# Patient Record
Sex: Female | Born: 1935 | Race: White | Hispanic: No | Marital: Married | Smoking: Never smoker
Health system: Southern US, Community
[De-identification: ages and names within clinical notes are randomized; demographics above are authoritative.]

## PROBLEM LIST (undated history)

## (undated) DIAGNOSIS — I219 Acute myocardial infarction, unspecified: Secondary | ICD-10-CM

## (undated) DIAGNOSIS — I4891 Unspecified atrial fibrillation: Secondary | ICD-10-CM

## (undated) DIAGNOSIS — Z8619 Personal history of other infectious and parasitic diseases: Secondary | ICD-10-CM

## (undated) DIAGNOSIS — K5790 Diverticulosis of intestine, part unspecified, without perforation or abscess without bleeding: Secondary | ICD-10-CM

## (undated) DIAGNOSIS — I2510 Atherosclerotic heart disease of native coronary artery without angina pectoris: Secondary | ICD-10-CM

## (undated) DIAGNOSIS — B0229 Other postherpetic nervous system involvement: Secondary | ICD-10-CM

## (undated) DIAGNOSIS — I259 Chronic ischemic heart disease, unspecified: Secondary | ICD-10-CM

## (undated) DIAGNOSIS — M1990 Unspecified osteoarthritis, unspecified site: Secondary | ICD-10-CM

## (undated) DIAGNOSIS — E785 Hyperlipidemia, unspecified: Secondary | ICD-10-CM

## (undated) HISTORY — DX: Atherosclerotic heart disease of native coronary artery without angina pectoris: I25.10

## (undated) HISTORY — DX: Unspecified osteoarthritis, unspecified site: M19.90

## (undated) HISTORY — DX: Hyperlipidemia, unspecified: E78.5

## (undated) HISTORY — DX: Unspecified atrial fibrillation: I48.91

## (undated) HISTORY — DX: Diverticulosis of intestine, part unspecified, without perforation or abscess without bleeding: K57.90

## (undated) HISTORY — DX: Personal history of other infectious and parasitic diseases: Z86.19

## (undated) HISTORY — DX: Other postherpetic nervous system involvement: B02.29

## (undated) HISTORY — DX: Chronic ischemic heart disease, unspecified: I25.9

---

## 1987-01-25 HISTORY — PX: EYE SURGERY: SHX253

## 1990-01-24 HISTORY — PX: BREAST LUMPECTOMY: SHX2

## 1997-08-12 ENCOUNTER — Other Ambulatory Visit: Admission: RE | Admit: 1997-08-12 | Discharge: 1997-08-12 | Payer: Self-pay | Admitting: Obstetrics and Gynecology

## 1998-07-02 ENCOUNTER — Encounter: Payer: Self-pay | Admitting: *Deleted

## 1998-07-02 ENCOUNTER — Ambulatory Visit (HOSPITAL_COMMUNITY): Admission: RE | Admit: 1998-07-02 | Discharge: 1998-07-02 | Payer: Self-pay | Admitting: *Deleted

## 2000-01-25 DIAGNOSIS — I2510 Atherosclerotic heart disease of native coronary artery without angina pectoris: Secondary | ICD-10-CM

## 2000-01-25 HISTORY — PX: CORONARY ARTERY BYPASS GRAFT: SHX141

## 2000-01-25 HISTORY — DX: Atherosclerotic heart disease of native coronary artery without angina pectoris: I25.10

## 2000-06-07 ENCOUNTER — Encounter: Payer: Self-pay | Admitting: Emergency Medicine

## 2000-06-07 ENCOUNTER — Inpatient Hospital Stay (HOSPITAL_COMMUNITY): Admission: EM | Admit: 2000-06-07 | Discharge: 2000-06-19 | Payer: Self-pay | Admitting: Emergency Medicine

## 2000-06-08 HISTORY — PX: CARDIAC CATHETERIZATION: SHX172

## 2000-06-12 ENCOUNTER — Encounter: Payer: Self-pay | Admitting: Surgery

## 2000-06-13 ENCOUNTER — Encounter: Payer: Self-pay | Admitting: Surgery

## 2000-06-14 ENCOUNTER — Encounter: Payer: Self-pay | Admitting: Surgery

## 2000-07-18 ENCOUNTER — Encounter (HOSPITAL_COMMUNITY): Admission: RE | Admit: 2000-07-18 | Discharge: 2000-10-16 | Payer: Self-pay | Admitting: Cardiology

## 2002-01-29 ENCOUNTER — Other Ambulatory Visit: Admission: RE | Admit: 2002-01-29 | Discharge: 2002-01-29 | Payer: Self-pay | Admitting: Family Medicine

## 2005-04-20 ENCOUNTER — Other Ambulatory Visit: Admission: RE | Admit: 2005-04-20 | Discharge: 2005-04-20 | Payer: Self-pay | Admitting: Family Medicine

## 2009-08-26 ENCOUNTER — Ambulatory Visit: Payer: Self-pay | Admitting: Cardiology

## 2009-09-02 ENCOUNTER — Ambulatory Visit: Payer: Self-pay | Admitting: Cardiology

## 2009-09-11 ENCOUNTER — Ambulatory Visit: Payer: Self-pay | Admitting: Cardiology

## 2009-09-18 ENCOUNTER — Ambulatory Visit: Payer: Self-pay | Admitting: Cardiology

## 2009-10-02 ENCOUNTER — Ambulatory Visit: Payer: Self-pay | Admitting: Cardiology

## 2009-10-02 ENCOUNTER — Encounter: Admission: RE | Admit: 2009-10-02 | Discharge: 2009-10-02 | Payer: Self-pay | Admitting: Cardiology

## 2009-10-20 ENCOUNTER — Ambulatory Visit: Payer: Self-pay | Admitting: Cardiology

## 2009-10-22 ENCOUNTER — Ambulatory Visit: Payer: Self-pay | Admitting: Cardiology

## 2009-10-23 ENCOUNTER — Ambulatory Visit (HOSPITAL_COMMUNITY): Admission: RE | Admit: 2009-10-23 | Discharge: 2009-10-23 | Payer: Self-pay | Admitting: Cardiology

## 2009-10-23 ENCOUNTER — Ambulatory Visit: Payer: Self-pay | Admitting: Cardiology

## 2009-10-29 ENCOUNTER — Ambulatory Visit: Payer: Self-pay | Admitting: Cardiology

## 2009-11-26 ENCOUNTER — Ambulatory Visit: Payer: Self-pay | Admitting: Cardiology

## 2009-12-07 ENCOUNTER — Ambulatory Visit: Payer: Self-pay | Admitting: Cardiology

## 2010-01-05 ENCOUNTER — Ambulatory Visit: Payer: Self-pay | Admitting: Cardiology

## 2010-02-05 ENCOUNTER — Ambulatory Visit: Payer: Self-pay | Admitting: Cardiology

## 2010-03-08 ENCOUNTER — Encounter (INDEPENDENT_AMBULATORY_CARE_PROVIDER_SITE_OTHER): Payer: Medicare Other

## 2010-03-08 DIAGNOSIS — R011 Cardiac murmur, unspecified: Secondary | ICD-10-CM

## 2010-03-08 DIAGNOSIS — I4891 Unspecified atrial fibrillation: Secondary | ICD-10-CM

## 2010-03-18 ENCOUNTER — Other Ambulatory Visit (INDEPENDENT_AMBULATORY_CARE_PROVIDER_SITE_OTHER): Payer: Medicare Other

## 2010-03-18 DIAGNOSIS — Z7901 Long term (current) use of anticoagulants: Secondary | ICD-10-CM

## 2010-03-18 DIAGNOSIS — I4891 Unspecified atrial fibrillation: Secondary | ICD-10-CM

## 2010-04-05 ENCOUNTER — Encounter (INDEPENDENT_AMBULATORY_CARE_PROVIDER_SITE_OTHER): Payer: Medicare Other

## 2010-04-05 DIAGNOSIS — Z7901 Long term (current) use of anticoagulants: Secondary | ICD-10-CM

## 2010-04-05 DIAGNOSIS — I4891 Unspecified atrial fibrillation: Secondary | ICD-10-CM

## 2010-04-19 ENCOUNTER — Ambulatory Visit (INDEPENDENT_AMBULATORY_CARE_PROVIDER_SITE_OTHER): Payer: Medicare Other | Admitting: *Deleted

## 2010-04-19 DIAGNOSIS — I4819 Other persistent atrial fibrillation: Secondary | ICD-10-CM | POA: Insufficient documentation

## 2010-04-19 DIAGNOSIS — E7800 Pure hypercholesterolemia, unspecified: Secondary | ICD-10-CM

## 2010-04-19 DIAGNOSIS — Z7901 Long term (current) use of anticoagulants: Secondary | ICD-10-CM

## 2010-04-19 DIAGNOSIS — I4811 Longstanding persistent atrial fibrillation: Secondary | ICD-10-CM | POA: Insufficient documentation

## 2010-04-19 DIAGNOSIS — I4891 Unspecified atrial fibrillation: Secondary | ICD-10-CM

## 2010-04-19 LAB — CBC WITH DIFFERENTIAL/PLATELET
Basophils Absolute: 0 10*3/uL (ref 0.0–0.1)
Basophils Relative: 0 % (ref 0–1)
Eosinophils Absolute: 0.2 10*3/uL (ref 0.0–0.7)
Eosinophils Relative: 3 % (ref 0–5)
HCT: 43 % (ref 36.0–46.0)
Hemoglobin: 14 g/dL (ref 12.0–15.0)
Lymphocytes Relative: 29 % (ref 12–46)
Lymphs Abs: 1.4 10*3/uL (ref 0.7–4.0)
MCH: 30.1 pg (ref 26.0–34.0)
MCHC: 32.6 g/dL (ref 30.0–36.0)
MCV: 92.5 fL (ref 78.0–100.0)
Monocytes Absolute: 0.5 10*3/uL (ref 0.1–1.0)
Monocytes Relative: 10 % (ref 3–12)
Neutro Abs: 2.8 10*3/uL (ref 1.7–7.7)
Neutrophils Relative %: 57 % (ref 43–77)
Platelets: 259 10*3/uL (ref 150–400)
RBC: 4.65 MIL/uL (ref 3.87–5.11)
RDW: 14 % (ref 11.5–15.5)
WBC: 4.9 10*3/uL (ref 4.0–10.5)

## 2010-04-19 LAB — POCT INR: INR: 2.3

## 2010-04-19 NOTE — Progress Notes (Signed)
Addended by: Carman Ching on: 04/19/2010 12:58 PM   Modules accepted: Orders

## 2010-04-20 LAB — COMPREHENSIVE METABOLIC PANEL
ALT: 28 U/L (ref 0–35)
AST: 20 U/L (ref 0–37)
Albumin: 4.4 g/dL (ref 3.5–5.2)
Alkaline Phosphatase: 66 U/L (ref 39–117)
BUN: 23 mg/dL (ref 6–23)
CO2: 25 mEq/L (ref 19–32)
Calcium: 9.1 mg/dL (ref 8.4–10.5)
Chloride: 106 mEq/L (ref 96–112)
Creat: 0.77 mg/dL (ref 0.40–1.20)
Glucose, Bld: 91 mg/dL (ref 70–99)
Potassium: 4.2 mEq/L (ref 3.5–5.3)
Sodium: 143 mEq/L (ref 135–145)
Total Bilirubin: 0.4 mg/dL (ref 0.3–1.2)
Total Protein: 6.9 g/dL (ref 6.0–8.3)

## 2010-04-20 LAB — LIPID PANEL
HDL: 47 mg/dL (ref >39)
Total CHOL/HDL Ratio: 4.4 Ratio

## 2010-04-23 ENCOUNTER — Telehealth: Payer: Self-pay | Admitting: *Deleted

## 2010-04-23 NOTE — Telephone Encounter (Signed)
Message copied by Regis Bill on Fri Apr 23, 2010  9:53 AM ------      Message from: Cassell Clement      Created: Tue Apr 20, 2010  9:37 AM       Please report.  LDL cholesterol slightly high at 134.  Total cholesterol 208.  Continue same regimen and careful diet.

## 2010-04-23 NOTE — Progress Notes (Signed)
Advised patient of labs 

## 2010-05-17 ENCOUNTER — Encounter: Payer: Self-pay | Admitting: Cardiology

## 2010-05-17 DIAGNOSIS — I259 Chronic ischemic heart disease, unspecified: Secondary | ICD-10-CM | POA: Insufficient documentation

## 2010-05-17 DIAGNOSIS — E785 Hyperlipidemia, unspecified: Secondary | ICD-10-CM | POA: Insufficient documentation

## 2010-05-17 DIAGNOSIS — I2510 Atherosclerotic heart disease of native coronary artery without angina pectoris: Secondary | ICD-10-CM | POA: Insufficient documentation

## 2010-05-24 ENCOUNTER — Encounter (INDEPENDENT_AMBULATORY_CARE_PROVIDER_SITE_OTHER): Payer: Medicare Other | Admitting: *Deleted

## 2010-05-24 ENCOUNTER — Encounter: Payer: Self-pay | Admitting: Cardiology

## 2010-05-24 ENCOUNTER — Ambulatory Visit (INDEPENDENT_AMBULATORY_CARE_PROVIDER_SITE_OTHER): Payer: Medicare Other | Admitting: *Deleted

## 2010-05-24 ENCOUNTER — Ambulatory Visit (INDEPENDENT_AMBULATORY_CARE_PROVIDER_SITE_OTHER): Payer: Medicare Other | Admitting: Cardiology

## 2010-05-24 DIAGNOSIS — I4891 Unspecified atrial fibrillation: Secondary | ICD-10-CM

## 2010-05-24 DIAGNOSIS — E785 Hyperlipidemia, unspecified: Secondary | ICD-10-CM

## 2010-05-24 LAB — POCT INR: INR: 2.6

## 2010-05-24 NOTE — Progress Notes (Signed)
Ellen Barajas Date of Birth:  Jul 24, 1928 [facility]  [address]   [address] [place], [place]  [address] [phone]           Fax   [phone]  HPI: This pleasant 77 year old woman has a history of established atrial fibrillation.  She also has a history of ischemic heart disease.  She underwent coronary artery bypass graft surgery on 06/12/00 by Dr. Laneta Simmers.  She has not had any recurrent angina.  She developed atrial fibrillation in July 2011.  She underwent successful cardioversion on 10/23/09.  However, one week later she was back in atrial fibrillation.  At that point we elected not to make any further attempts at cardioversion since she herself is not aware of her arrhythmia and is really not very symptomatic from the atrial fibrillation.  Her last echocardiogram on 08/20/09 showed normal left atrial size and normal left ventricular ejection fraction of 55-60% and mild aortic sclerosis with trivial aortic insufficiency and mild mitral regurgitation and normal pulmonary artery pressure.  Current Outpatient Prescriptions  Medication Sig Dispense Refill  . aspirin 81 MG tablet Take 81 mg by mouth daily.        Marland Kitchen atenolol (TENORMIN) 25 MG tablet Take 25 mg by mouth daily. 1/4 DAILY        . COD LIVER OIL PO Take by mouth at bedtime.        . Coenzyme Q10 (COQ10 PO) Take by mouth daily.        . fish oil-omega-3 fatty acids 1000 MG capsule Take by mouth 2 (two) times daily.        . nitroGLYCERIN (NITROSTAT) 0.4 MG SL tablet Place 0.4 mg under the tongue every 5 (five) minutes as needed.        . pravastatin (PRAVACHOL) 10 MG tablet Take 10 mg by mouth every other day.        . ramipril (ALTACE) 10 MG tablet Take 10 mg by mouth daily.        . Red Yeast Rice Extract (RED YEAST RICE PO) Take by mouth 2 (two) times daily.        Marland Kitchen warfarin (COUMADIN) 5 MG tablet Take 5 mg by mouth daily. AS DIRECTED         Allergies  Allergen Reactions  . Crestor  (Rosuvastatin Calcium)   . Lipitor (Atorvastatin Calcium)   . Niaspan (Niacin (Antihyperlipidemic))   . Welchol (Colesevelam Hcl)   . Zetia (Ezetimibe)     Patient Active Problem List  Diagnoses  . Atrial fibrillation  . AF (atrial fibrillation)  . IHD (ischemic heart disease)  . Dyslipidemia  . Coronary artery disease    History  Smoking status  . Never Smoker   Smokeless tobacco  . Not on file    History  Alcohol Use No    No family history on file.  Review of Systems: The patient denies any heat or cold intolerance.  No weight gain or weight loss.  The patient denies headaches or blurry vision.  There is no cough or sputum production.  The patient denies dizziness.  There is no hematuria or hematochezia.  The patient denies any muscle aches or arthritis.  The patient denies any rash.  The patient denies frequent falling or instability.  There is no history of depression or anxiety.  All other systems were reviewed and are negative.   Physical Exam: Filed Vitals:   05/24/10 1125  BP: 140/70  Pulse: 70  Weight is 160.  The general appearance reveals a well-developed well-nourished woman in no distress.Pupils equal and reactive.   Extraocular Movements are full.  There is no scleral icterus.  The mouth and pharynx are normal.  The neck is supple.  The carotids reveal no bruits.  The jugular venous pressure is normal.  The thyroid is not enlarged.  There is no lymphadenopathy.The chest is clear to percussion and auscultation. There are no rales or rhonchi. Expansion of the chest is symmetrical.  The breasts reveal no masses.The precordium is quiet.  The first heart sound is normal.  The second heart sound is physiologically split.  There is no murmur gallop rub or click.  There is no abnormal lift or heave.  The rhythm is irregular.The abdomen is soft and nontender. Bowel sounds are normal. The liver and spleen are not enlarged. There Are no abdominal masses. There are no  bruits.The pedal pulses are good.  There is no phlebitis or edema.  There is no cyanosis or clubbing.Strength is normal and symmetrical in all extremities.  There is no lateralizing weakness.  There are no sensory deficits.   Assessment / Plan: Her INR is 2.6.  She will continue same Coumadin.  Recheck for monthly protimes and she will return in 3 months for followup office visit protime and fasting lipid panel and chemistries

## 2010-05-24 NOTE — Assessment & Plan Note (Signed)
The patient is not presently on any statin therapy.  She has been intolerant to Lipitor Zetia Crestor Niaspan and WelChol.  She is trying to manage her cholesterol levels with careful diet and with exercise.  She has not been successful in weight loss at this point.  She has not been using any chest pain or angina.

## 2010-05-24 NOTE — Assessment & Plan Note (Signed)
The patient has a history of established chronic atrial fibrillation.  She is on long-term Coumadin .  She denies any symptoms of TIA or stroke.  [name] not having any increased dyspnea or any symptoms or signs of congestive heart failure.  [name] not having complications from the Coumadin.

## 2010-05-27 ENCOUNTER — Telehealth: Payer: Self-pay | Admitting: Cardiology

## 2010-05-27 NOTE — Telephone Encounter (Signed)
Received message to call patient regarding copays.  Patient states they did not pay copay on last visit.  I looked at the check out screen and see that there was a copay posted for both her and husband.  Advised patient that I am unable to determine how much if anything she owes.  She can not specify dos that she thinks they owe, said it was last visit which is what I looked at.  Advised patient that we would wait and see if they receive a bill for the dates she thinks they owe.  Going forward we will make sure the copay is collected at time of service and if there is a question to ask for me.  Patient ok.

## 2010-06-10 ENCOUNTER — Telehealth: Payer: Self-pay | Admitting: *Deleted

## 2010-06-10 NOTE — Telephone Encounter (Signed)
Patient phoned and is having cataract surgery on 5/22 and is wanting to know what to do with coumadin.  Advised ok to hold the night before per Dr. Patty Sermons.

## 2010-06-11 NOTE — Discharge Summary (Signed)
[place]  Patient:    Ellen Barajas, Ellen Barajas                         MRN: [id number] Adm. Date:  [id number] Disc. Date: [id number] Attending:  Cleatrice Burke Dictator:   Dominica Severin, P.A. CC:         Willis Modena. Dreiling, M.D.  Thomas A. Patty Sermons, M.D.  CVTS Office   Discharge Summary  DATE OF BIRTH:  12-Mar-1928  PRIMARY ADMISSION DIAGNOSIS:  Chest pain, rule out myocardial infarction.  SECONDARY DIAGNOSES/PREEXISTING CONDITION: 1. Hyperlipidemia. 2. History of diverticulosis. 3. History of gallstones. 4. 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 5. Status post eye surgery in 1989 by Dr. Cecilie Kicks. 6. Allergies to all NARCOTICS.  NEW DIAGNOSES THIS ADMISSION/DISCHARGE DIAGNOSES: 1. Preoperative as well as postoperative atrial fibrillation. 2. Severe two-vessel coronary artery disease, status post non-Q-wave    myocardial infarction.  PROCEDURES THIS ADMISSION: 1. Cardiac catheterization done on May 16. 2. Pre coronary artery bypass graft surgery Doppler evaluation on    May 17. 3. On May 20 the patient underwent coronary artery bypass graft surgery x4,    the left internal mammary artery to the left anterior descending artery,    saphenous vein graft to the obtuse marginal #1 branch and obtuse marginal    #2 branch and saphenous vein graft to the right coronary artery.  HOSPITAL COURSE:  This patient was admitted on May 15 to [facility] ER in the care of Dr. Patty Sermons to rule out an MI.  Cardiac catheterization was done on May 16 which showed severe two-vessel coronary artery disease and the patient was seen at that time by Dr. Laneta Simmers for consultation, and it was determine at the time that the patient would benefit from coronary artery bypass graft surgery. The patient underwent surgery as stated above on Jun 12, 2000.  She tolerated the procedure well.  Her postoperative course was notable for some atrial fibrillation  with rapid ventricular response.  The patient was started on digoxin, Lopressor and Coumadin initiated.  The patient continued to be in and out of atrial fibrillation, and on postop day #4 the patient was started on amiodarone and eventually converted to normal sinus rhythm and remained in normal sinus rhythm.  Her INR was therapeutic and the patient is discharged on Jun 19, 2000, in stable condition.  DISCHARGE MEDICATIONS: 1. Atenolol 12.5 mg p.o. b.i.d. 2. Amiodarone 400 mg p.o. t.i.d. x3 days and then 200 mg once a day. 3. Digoxin 0.125 mg once a day. 4. Niferex 150 mg once a day. 5. Colace 200 mg once a day. 6. Coumadin 2.5 mg once a day or as directed by blood draws.  ACTIVITY:  The patient is instructed not to do any driving or heavy lifting, pushing or pulling.  To continue her breathing exercises and continue to walk daily.  DIET:  The patient is to follow a low fat, low sodium diet.  WOUND CARE:  She was told she could shower and call the office for any wound problems arises noted on cardiac surgery fact sheet.  SPECIAL INSTRUCTIONS:  The patient is to have her blood drawn on Wednesday, May 29 at Dr. Jenness Corner office and at that time they will determine her Coumadin dosages.  FOLLOWUP:  The patient has a followup date in 2 weeks with Dr. Patty Sermons with a chest x-ray and she is to bring  that chest x-ray with her to Dr. Laneta Simmers office in 3 weeks, and the office will call her with that appointment time. DD:  06/19/00 TD:  06/19/00 Job: [id number]

## 2010-06-11 NOTE — H&P (Signed)
[place]  Patient:    Ellen Barajas, Ellen Barajas                         MRN: [id number] Adm. Date:  [id number] Attending:  Molpus, Carlisle Beers CC:         Amada Jupiter T. Dreiling, M.D.   History and Physical  CHIEF COMPLAINT:  Chest pain.  HISTORY:  This is a 77 year old, married, Caucasian female admitted with chest pain through the emergency room.  She had the onset of chest discomfort approximately 12 hours ago.  It began at bedtime last evening.  She thought it was indigestion.  It persisted throughout the night.  At about 5 a.m., because she had not slept, she got up and sat in a chair, but the pain did not improve.  The pain was substernal and radiated under the left breast.  There was no associated dyspnea.  This morning, she was nauseated but did not vomit. She was not diaphoretic.  The pain was not pleuritic and did not appear to the affected by change in position.  The patient has been healthy throughout her life.  Her only problem has been elevated cholesterol which is a family history trait.  Her cholesterol had previously been 252; and, with careful dieting, she had been able to get it down to 199.  She had not been on any lipid-lowering medicines.  MEDICATIONS:  She is no on any medications except for 81 mg aspirin a day. She also takes calcium, multivitamins, vitamin A, vitamin E, vitamin C, several herbs, and she takes Bioflex for tendonitis.  PAST MEDICAL HISTORY:  She has no history of diabetes, hypertension, or stroke.  SOCIAL HISTORY:  She has been married for 51 years.  She has two children, one of whom lives in [place].  The patient does not use tobacco or alcohol. She is active in church work.  ALLERGIES:  She is allergic to all NARCOTICS.  PAST SURGICAL HISTORY:  She had a fibrous tumor removed from her left breast by Dr. Verne Carrow about 10 years ago.  She has had childbirth twice.  She had a hole in her retina repaired by Dr.  Cecilie Kicks in 1989.  REVIEW OF SYSTEMS:  She has a history of diverticulosis.  She also has a known gallstone which has not been operated on.  She has a remote history of stomach spasm.  She denies any dysuria but does have a leaking bladder and has nocturia x 1.  Respiratory reveals a history of recent mild upper respiratory infection with cough and phlegm but no fever and no purulent sputum. Cardiovascular history reveals that she is able to mow her yard.  She does not have any history of exertional chest pain.  A week ago, while working in the garden, she got unexpectedly exhausted and had to lie down on the ground for a short time to get her strength back but then was able to proceed with finishing the work.  She has noted occasional runs of palpitations over the years.  These have been brief and usually are corrected by burping several times.  FAMILY HISTORY:  Her mother died at age 30 after having heart attack and stroke.  Her father died of a heart attack at 72.  She has one brother who has ischemic heart disease and diabetes and has an AICD.  She has a sister and a brother who are asymptomatic except for elevated cholesterol.  PHYSICAL EXAMINATION:  VITAL SIGNS:  Blood pressure 140/70, pulse 130 in atrial fibrillation which falls to 60 sinus rhythm without specific therapy.  HEAD AND NECK:  Negative.  Jugular venous pressure normal.  Carotids normal.  CHEST:  Clear.  The heart reveals a quiet precordium without murmur, gallop, rub, or click.  ABDOMEN:  Soft without evidence of organomegaly or masses.  There is no chest wall tenderness.  EXTREMITIES:  Good peripheral pulses.  No edema or phlebitis.  LABORATORY DATA:  Her electrocardiogram #1 shows normal sinus rhythm and no ischemic changes; #2 shows atrial fibrillation with rapid ventricular response and no ischemic change.  Chest x-ray is normal and shows normal heart size and clear lung fields.  Laboratory studies of  note include a CK of 92 with an MB of 3.5 and a troponin slightly elevated at 0.14.  IMPRESSION: 1. Chest pain, rule out myocardial infarction. 2. Paroxysmal atrial fibrillation. 3. History of hypercholesterolemia.  DISPOSITION:  Admit to telemetry.  Will treat with IV nitroglycerin, IV heparin.  Will also give oral beta blocker and aspirin.  Will get serial enzymes.  Will also give her an empiric trial of Nexium.  Anticipate cardiac catheterization Jun 08, 2000. DD:  06/07/00 TD:  06/07/00 Job: [id number]

## 2010-06-11 NOTE — Op Note (Signed)
[place]  Patient:    EVAH, RASHID                         MRN: [id number] Proc. Date: 06/12/00 Adm. Date:  [id number] Attending:  Cleatrice Burke CC:         CVTS office.  Thomas A. Patty Sermons, M.D.  Cardiac Catheterization Lab at [facility]   Operative Report  PREOPERATIVE DIAGNOSIS:  Severe 2 vessel coronary artery disease with unstable angina status post non Q wave myocardial infarction.  POSTOPERATIVE DIAGNOSIS:  Severe 2 vessel coronary artery disease with unstable angina status post nn Q wave myocardial infarction.  PROCEDURE:  Median sternotomy, extracorporeal circulation, coronary artery bypass graft x 4 using a left internal mammary artery graft to the left anterior descending coronary, with a sequential saphenous vein graft to the first and second obtuse marginal branch of the left circumflex coronary artery and a saphenous vein graft to the right coronary artery.  SURGEON:  Alleen Borne, M.D.  ASSISTANT:  Dominica Severin, P.A.-C.  ANESTHESIA:  General endotracheal.  INDICATIONS:  This patient is a 77 year old white female in previous good health who was admitted on Jun 07, 2000, with a prolonged episode of chest pain.  She ruled in for a non Q wave myocardial infarction with mildly elevated troponin level of 0.14.  Her CPK was 92 with an MB of 3.5.  She had no significant electrocardiogram changes on admission but did develop atrial fibrillation with a rapid ventricular response.  She underwent cardiac catheterization on Jun 08, 2000, which showed the LAD to have heavy proximal calcification.  There was 50 to 70% proximal LAD stenosis.  The left circumflex was diffusely diseased with 40-50% proximal and mid vessel stenosis.  There was a 90% stenosis of the bifurcation of the first marginal branch.  The second marginal had a 80% stenosis of the bifurcation.  The right coronary artery had 30% proximal and mid vessel  stenosis.  Left ventricular function  was normal.  After review of the angiogram and examination of the patient it was felt that coronary artery bypass graft surgery was the best treatment.  I discussed the operative procedure with her and her husband including alternatives, benefits, and risks, including bleeding, possible blood transfusion, infection, stroke, myocardial infarction and death.  They understood and agreed to proceed.  DESCRIPTION OF PROCEDURE:  The patient was taken to the operating room and placed on the table in the supine position.  After induction of general endotracheal anesthesia, a Foley catheter was placed in the bladder using a sterile technique.  Then the chest, abdomen and both lower extremities were prepped and draped in the usual sterile manner.  The chest was entered through a median sternotomy incision and the pericardium opened in the midline. Examination of the heart showed good ventricular contractility.  The ascending aorta had no palpable plaques in it.  Then the left internal mammary artery was harvested from the chest wall as a pedicle graft.  This was a median caliber vessel with excellent blood flow through it.  At the same time a segment of greater saphenous vein was harvested from the right lower leg, and this vein was of medium size and good quality.  Then the patient was heparinized and when an adequate activated clotting time was achieved, the distal ascending aorta was cannulated using a 20 French aortic cannula for arterial inflow.  Venous outflow was achieved using the  two stage venous cannula through the atrial appendage.  Antegrade cardioplegia and vent cannula were inserted in the aortic root.  The patient was placed on coronary artery bypass and distal coronary arteries identified.  The LAD was an intramyocardial vessel in its proximal and mid portions.  It exited distally near the apex to the surface of the heart where it was a very  tiny vessel.  There was a large anterior descending vein coarsing over the interventricular groove.  The first and second marginal branches were medium sized graftable vessels. The right coronary artery was heavily diseased in its proximal and mid portions.  Distally just before the bifurcation of the posterior descending branch the artery was free of disease.  Then the aorta was crossclamped and 500 cc of cold blood antegrade cardioplegia was administered in the aortic root with quick arrest of the heart.  Systemic hypothermia to 20 degrees Centigrade and topical hypothermia with iced saline was used.  A temperature probe was placed in the septum and a insulating pad in the pericardium.  The first distal anastomosis was performed to the distal right coronary artery.  The internal diameter was about 2.5 mm.  The conduit used was a segment of greater saphenous vein.  The anastomosis was performed in a sequential end-to-side manner using continuous 7-0 Prolene suture.  The flow was measured through the graft and was excellent.  Then the second distal anastomosis was performed to the first marginal branch.  The internal diameter was 1.6 mm.  The conduit used was a segment of greater saphenous vein.  The anastomosis was performed in the sequential side-to-side manner using continuous 7-0 Prolene suture.  Flow was measured through the graft and was excellent.  A third distal anastomosis was performed to the second marginal branch. The internal diameter was about 1.6 mm.  The conduit used was the same segment of greater saphenous vein.  The anastomosis was performed in a sequential end-to-side manner using continuous 7-0 Prolene suture.  Flow was measured through the graft and was excellent.  Then a dose of cardioplegia was given down the vein grafts and in the aortic root.  The fourth distal anastomosis was performed to the mid portion of the left  anterior descending coronary artery.   This artery was dissected in the interventricular groove and was lying underneath the septal myocardium.  It was located just beyond the takeoff of diagonal branch.  The internal diameter of this vessel was about 1.75 mm.  The conduit used was the left internal mammary graft and this was brought through an opening in the left pericardium anterior to the phrenic nerve.  It was anastomosed to the LAD in an end-to-side manner using continuous 8-0 Prolene suture.  The pedicle was tacked to the epicardium with 6-0 Prolene sutures.  The patient was rewarmed to 37 degrees centigrade, clamps removed from the mammary pedicle.  There was rapid warming of the ventricular septum and return of spontaneous ventricular fibrillation.  The crossclamp was removed at a time of 59 minutes, and the patient defibrillated into sinus rhythm.  A partial occlusion clamp was placed in the aortic root and the two proximal vein grafts anastomosis were performed in an end-to-side manner using a continuous 6-0 Prolene suture.  The clamp was removed, the vein grafts deaired and the clamps removed from them.  The proximal and distal anastomoses appeared hemostatic.  The lie of the graft was satisfactory.  Graft markers were placed around the proximal anastomosis.  Two temporary,  right ventricular and right atrial pacing wires were placed and brought out through the skin. When the patient had rewarmed to 37 degrees centigrade, she was weaned from cardiopulmonary bypass on no inotropic agents.  Total bypass time was 94 minutes.  Cardiac function appeared excellent with a cardiac output of 5 liters per day.  Protamine was given and the venous and aortic cannulae were removed without difficulty.  Hemostasis was achieved.  Three chest tubes were placed with two in the posterior pericardium, one in the left pleural space and one in the anterior mediastinum.  The pericardium was reapproximated over the heart.  The  sternum was closed with #6 stainless steels wires.  The fascia was closed with continuous #1 Vicryl sutures.  Subcutaneous tissue was closed with continuous 2-0 Vicryl and the skin with 3-0 Vicryl for subcuticular closure.  The lower extremity vein harvest site was closed in layers in a similar manner.  The sponge, needle and instrument counts were correct according to the scrub nurse.  Dry sterile dressings were applied over the incisions, around the chest tubes which were hooked to Pleuravac suction.  The patient remained hemodynamically stable and was transported to the SICU in guarded but stable condition. DD:  06/12/00 TD:  06/12/00 Job: [id number]

## 2010-06-11 NOTE — Cardiovascular Report (Signed)
[place]. [facility]  Patient:    Ellen Barajas, Ellen Barajas                         MRN: [id number] Proc. Date: 06/08/00 Adm. Date:  [id number] Attending:  Rudean Hitt CC:         Thomas A. Patty Sermons, M.D.  Cardiac Catheterization Laboratory   Cardiac Catheterization  INDICATIONS FOR PROCEDURE:  The patient is a 77 year old, white female, who presented with unstable angina pectoris and positive troponins.  She also has had paroxysmal atrial fibrillation.  She has a history of hypercholesterolemia.  ACCESS:  Via the right femoral artery using the standard Seldinger technique.  EQUIPMENT:  A 6 French 4 cm right and left Judkins catheter, 6 French pigtail catheter, 6 French arterial sheath.  MEDICATIONS:  Local anesthesia with 1% Xylocaine.  CONTRAST:  Omnipaque 170 cc.  HEMODYNAMIC DATA:  Aortic pressure 128/58 with a mean of 85.  Left ventricular pressure is 138 with an EDP of 18 mmHg.  By pullback there is approximately a 8 mm Gradient.  ANGIOGRAPHIC DATA:  Left coronary artery:  The left coronary artery arises and distributes normally.  Left main:  The left main coronary artery is without significant disease.  Left anterior descending:  The left anterior descending artery is moderately calcified.  In the proximal vessel there is modest disease of approximately 50-60%.  The mid LAD also has wall irregularities.  Left circumflex:  The left circumflex coronary artery is diffusely diseased. In the proximal vessel there is a 40% stenosis followed by 40-50% stenosis prior to the bifurcation of the first and second obtuse marginal vessels. There is severe bifurcation disease in these vessels with a 90% stenosis at the ostium of the first marginal vessel and 80% stenosis in the origin of the second obtuse marginal.  These vessels are relatively small at less or equal to 2.5 mm and tortuous.  Right coronary artery:  The right coronary artery arises  and distributes normally.  There is mild disease in the proximal mid vessel up to 30%.  LEFT VENTRICULAR ANGIOGRAPHY:  The left ventricular angiography performed in the RAO and LAO cranial views demonstrate normal left ventricular size and contractility with normal systolic function.  Ejection fraction is calculated at 71%.  There are no segmental wall motion abnormalities.  There is no significant mitral insufficiency.  FINAL INTERPRETATION: 1. Severe complex single-vessel obstructive coronary disease.  There is also    moderate disease in the proximal left anterior descending. 2. Normal left ventricular function. 3. Mild aortic valve gradient.  PLAN:  It would be technically very difficult to approach the circumflex disease with catheter-based intervention.  This represents a bifurcation stenosis with disease proximal to the bifurcation as well.  These are relatively small and tortuous branches and it would be difficult to get a device down to the lesion given the angulation from the left main and the angulation of the first marginal vessel. DD:  06/08/00 TD:  06/09/00 Job: [id number]

## 2010-06-11 NOTE — Consult Note (Signed)
[place]. [facility]  Patient:    Ellen Barajas, Ellen Barajas                         MRN: [id number] Proc. Date: 06/08/00 Adm. Date:  [id number] Attending:  Rudean Hitt CC:         Alleen Borne, M.D.  Thomas A. Patty Sermons, M.D.  Medical records   Consultation Report  REFERRING PHYSICIAN:  Maisie Fus A. Patty Sermons, M.D.  REASON FOR CONSULTATION:  Severe two vessel coronary artery disease, status post non-Q-wave myocardial infarction.  HISTORY OF PRESENT ILLNESS:  This patient is a 77 year old white female, who was previously in good health, who was admitted yesterday with a prolonged episode of chest pain.  She had onset of chest discomfort about 12 hours prior to admission that began at bed time on the evening of Jun 06, 2000.  She thought that it was indigestion.  It was moderately severe and persisted throughout the night.  At about 5 in the morning, she sat in the chair but the pain did not improve.  She described it as a substernal pain radiating under the left breast.  On the morning of admission, she was nauseated but did not vomit.  She had no shortness of breath or diaphoresis.  She was seen in [facility] Emergency Room.  Troponin enzymes were slightly elevated at 0.14, CPK was 92 with a MB of 3.5.  There were no significant electrocardiogram changes initially but then the patient developed atrial fibrillation with a rapid ventricular response.  She underwent cardiac catheterization today which showed severe two vessel disease.  The LAD had proximal calcification.  There was 50-70% proximal stenosis.  Left circumflex was diffusely diseased with 40-50% proximal and mid-vessel stenosis.  There was a 90% stenosis at the bifurcation of OM-1 and 80% stenosis at the bifurcation at OM-2.  The right coronary artery had mild proximal and mid-vessel narrowing up to 30%.  Left ventricular function was normal.  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  She had a history of hole in her retina repaired by Dr. Guadelupe Sabin in 1989.  She has a history of diverticulosis.  She has gallstones.  She has a history of hypercholesterolemia, which has been treated.  She has no history of diabetes or hypertension.  MEDICATIONS:  Aspirin 81 mg q.d., calcium, multivitamins, vitamins A, E, C, several herbs, and Bio-Flex for tendonitis.  REVIEW OF SYSTEMS:  CONSTITUTIONAL:  She denies fevers or chills.  She has had no weight loss or weight gain.  EYES:  As mentioned above.  She had her hole in her retina repaired.  Subsequently, she has no vision problems at this time.  ENT:  Negative.  ENDOCRINE:  No history of hypothyroidism. CARDIOVASCULAR:  As above.  She denies PND or orthopnea.  She has no peripheral edema. RESPIRATORY:  She denies cough or sputum production.  GI: She has had no dysphagia.  She has no history of peptic ulcer disease.  She denies melena or bright red blood per rectum.  GU:  She does have a leaking bladder and nocturia at night x 1.  She has no dysuria or hematuria. NEUROLOGICAL:  She denies any focal weakness or numbness.  She has no dizziness or syncope.  She has allergies to all narcotics.  MUSCULOSKELETAL: Negative.  ALLERGIES:  All NARCOTICS.  FAMILY HISTORY:  Positive for coronary disease.  Her mother had several heart attacks and died in her 90s of heart attack and heart failure.  Her father died of a heart attack.  She has one brother who has coronary artery bypass surgery and also has an internal defibrillator in place.  SOCIAL HISTORY:  She is married and has two children.  She is active in her church.  She denies tobacco or alcohol.  PHYSICAL EXAMINATION:  VITAL SIGNS:  Blood pressure is 120/70 and her heart rate is 65 and regular. Respiratory rate is 18 and unlabored.  GENERAL:  She is a well-developed white female in no  acute distress.  HEENT:  Normocephalic and atraumatic.  The pupils are equal, round and reactive to light and accommodation.  Extraocular muscles are intact.  Her throat is clear.  NECK:  Normal carotid pulses bilaterally.  There are no bruits.  There is no adenopathy or thyromegaly.  CARDIOVASCULAR:  Regular rate and rhythm with a normal S1 and S2.  There is no murmur, rub, or gallop.  LUNGS:  Clear.  ABDOMEN:  Active bowel sounds.  Abdomen is soft, mildly obese, and nontender. There are no palpable masses and no organomegaly.  EXTREMITIES:  No peripheral edema.  Pedal pulses are palpable bilaterally.  NEUROLOGICAL:  Alert and oriented x 3.  Motor and sensory exams are grossly normal.  SKIN:  Warm and dry.  IMPRESSION:  This patient has significant two vessel coronary disease with unstable angina and was admitted with a small non-Q-wave myocardial infarction.  I agree that coronary artery bypass graft surgery is the best treatment for her since she has significant coronary calcifications and left circumflex stenoses at bifurcation points.  I discussed the operative procedure with her and her husband including alternatives, benefits, and risks including bleeding, possible blood transfusion, infection, stroke, myocardial infarction, and death.  They understand and would like to proceed with surgery.  We will plan to do this on Monday, Jun 12, 2000.  She will have carotid doppler examination performed tomorrow. DD:  06/08/00 TD:  06/10/00 Job: [id number]

## 2010-06-22 ENCOUNTER — Ambulatory Visit (INDEPENDENT_AMBULATORY_CARE_PROVIDER_SITE_OTHER): Payer: Medicare Other | Admitting: *Deleted

## 2010-06-22 DIAGNOSIS — I4891 Unspecified atrial fibrillation: Secondary | ICD-10-CM

## 2010-06-22 LAB — POCT INR: INR: 3

## 2010-07-20 ENCOUNTER — Ambulatory Visit (INDEPENDENT_AMBULATORY_CARE_PROVIDER_SITE_OTHER): Payer: Medicare Other | Admitting: *Deleted

## 2010-07-20 DIAGNOSIS — I4891 Unspecified atrial fibrillation: Secondary | ICD-10-CM

## 2010-08-04 ENCOUNTER — Other Ambulatory Visit: Payer: Self-pay | Admitting: *Deleted

## 2010-08-04 DIAGNOSIS — E785 Hyperlipidemia, unspecified: Secondary | ICD-10-CM

## 2010-08-23 ENCOUNTER — Other Ambulatory Visit: Payer: Self-pay | Admitting: Cardiology

## 2010-08-23 ENCOUNTER — Encounter: Payer: Self-pay | Admitting: Cardiology

## 2010-08-23 ENCOUNTER — Other Ambulatory Visit: Payer: Self-pay | Admitting: *Deleted

## 2010-08-23 ENCOUNTER — Other Ambulatory Visit (INDEPENDENT_AMBULATORY_CARE_PROVIDER_SITE_OTHER): Payer: Medicare Other | Admitting: *Deleted

## 2010-08-23 ENCOUNTER — Ambulatory Visit (INDEPENDENT_AMBULATORY_CARE_PROVIDER_SITE_OTHER): Payer: Medicare Other | Admitting: *Deleted

## 2010-08-23 ENCOUNTER — Ambulatory Visit (INDEPENDENT_AMBULATORY_CARE_PROVIDER_SITE_OTHER): Payer: Medicare Other | Admitting: Cardiology

## 2010-08-23 DIAGNOSIS — I10 Essential (primary) hypertension: Secondary | ICD-10-CM

## 2010-08-23 DIAGNOSIS — E785 Hyperlipidemia, unspecified: Secondary | ICD-10-CM

## 2010-08-23 DIAGNOSIS — I4891 Unspecified atrial fibrillation: Secondary | ICD-10-CM

## 2010-08-23 DIAGNOSIS — I259 Chronic ischemic heart disease, unspecified: Secondary | ICD-10-CM

## 2010-08-23 LAB — HEPATIC FUNCTION PANEL
ALT: 27 U/L (ref 0–35)
AST: 24 U/L (ref 0–37)
Alkaline Phosphatase: 62 U/L (ref 39–117)
Total Bilirubin: 0.8 mg/dL (ref 0.3–1.2)

## 2010-08-23 LAB — BASIC METABOLIC PANEL
BUN: 22 mg/dL (ref 6–23)
Chloride: 108 mEq/L (ref 96–112)
GFR: 60.16 mL/min (ref >60.00)
Glucose, Bld: 97 mg/dL (ref 70–99)
Potassium: 5.6 mEq/L — ABNORMAL HIGH (ref 3.5–5.1)
Sodium: 144 mEq/L (ref 135–145)

## 2010-08-23 LAB — LIPID PANEL
Cholesterol: 229 mg/dL — ABNORMAL HIGH (ref 0–200)
Total CHOL/HDL Ratio: 4
VLDL: 24.2 mg/dL (ref 0.0–40.0)

## 2010-08-23 LAB — POCT INR: INR: 2.2

## 2010-08-23 LAB — LDL CHOLESTEROL, DIRECT: Direct LDL: 143.3 mg/dL

## 2010-08-23 MED ORDER — RAMIPRIL 10 MG PO TABS: 10.0000 mg | ORAL_TABLET | Freq: Every day | ORAL | Status: DC

## 2010-08-23 MED ORDER — WARFARIN SODIUM 5 MG PO TABS: 5.0000 mg | ORAL_TABLET | Freq: Every day | ORAL | Status: DC

## 2010-08-23 NOTE — Progress Notes (Signed)
Ellen Barajas Date of Birth:  07/06/1928 [facility] / [facility] [address]   [address] [place], [place]  [address] [phone]           Fax   [phone]  History of Present Illness: This pleasant 77 year old woman is seen for a scheduled followup office visit.  She has a history of known ischemic heart disease.  She had previous coronary artery bypass graft surgery in 2002 by Dr. Laneta Simmers.  In the summer of 2011 she was found to be in asymptomatic atrial fibrillation.  She underwent successful direct current cardioversion on 10/23/09 but one week later she was back in atrial fibrillation.  Because she herself is not bothered by her arrhythmia or having any symptoms we have allowed her to stay in atrial fib and she is staying on long-term Coumadin.  [name] not having any side effects from the Coumadin.  [name] not having any chest pain or angina.  The patient has a history of hypercholesterolemia and is intolerant of statins and most other lipid-lowering medication.  Current Outpatient Prescriptions  Medication Sig Dispense Refill  . aspirin 81 MG tablet Take 81 mg by mouth daily.        Marland Kitchen atenolol (TENORMIN) 25 MG tablet Take 25 mg by mouth daily. 1/4 DAILY        . COD LIVER OIL PO Take by mouth at bedtime.        . Coenzyme Q10 (COQ10 PO) Take by mouth daily.        . fish oil-omega-3 fatty acids 1000 MG capsule Take by mouth 2 (two) times daily.        . nitroGLYCERIN (NITROSTAT) 0.4 MG SL tablet Place 0.4 mg under the tongue every 5 (five) minutes as needed.        . Red Yeast Rice Extract (RED YEAST RICE PO) Take by mouth 2 (two) times daily.        Marland Kitchen DISCONTD: ramipril (ALTACE) 10 MG tablet Take 10 mg by mouth daily.        Marland Kitchen DISCONTD: warfarin (COUMADIN) 5 MG tablet Take 5 mg by mouth daily. AS DIRECTED       . ramipril (ALTACE) 10 MG tablet Take 1 tablet (10 mg total) by mouth daily.  90 tablet  3  . warfarin (COUMADIN) 5 MG tablet Take 1 tablet (5 mg total) by mouth  daily. AS DIRECTED  90 tablet  3    Allergies  Allergen Reactions  . Crestor (Rosuvastatin Calcium)   . Lipitor (Atorvastatin Calcium)   . Niaspan (Niacin (Antihyperlipidemic))   . Pravastatin   . Welchol (Colesevelam Hcl)   . Zetia (Ezetimibe)     Patient Active Problem List  Diagnoses  . Atrial fibrillation  . AF (atrial fibrillation)  . IHD (ischemic heart disease)  . Dyslipidemia  . Coronary artery disease    History  Smoking status  . Never Smoker   Smokeless tobacco  . Not on file    History  Alcohol Use No    No family history on file.  Review of Systems: Constitutional: no fever chills diaphoresis or fatigue or change in weight.  Head and neck: no hearing loss, no epistaxis, no photophobia or visual disturbance. Respiratory: No cough, shortness of breath or wheezing. Cardiovascular: No chest pain peripheral edema, palpitations. Gastrointestinal: No abdominal distention, no abdominal pain, no change in bowel habits hematochezia or melena. Genitourinary: No dysuria, no frequency, no urgency, no nocturia. Musculoskeletal:No arthralgias, no back  pain, no gait disturbance or myalgias. Neurological: No dizziness, no headaches, no numbness, no seizures, no syncope, no weakness, no tremors. Hematologic: No lymphadenopathy, no easy bruising. Psychiatric: No confusion, no hallucinations, no sleep disturbance.    Physical Exam: Filed Vitals:   08/23/10 1121  BP: 136/64  Pulse: 77  The general appearance feels a well-developed well-nourished woman in no distress.The head and neck exam reveals pupils equal and reactive.  Extraocular movements are full.  There is no scleral icterus.  The mouth and pharynx are normal.  The neck is supple.  The carotids reveal no bruits.  The jugular venous pressure is normal.  The  thyroid is not enlarged.  There is no lymphadenopathy.  The chest is clear to percussion and auscultation.  There are no rales or rhonchi.  Expansion of the  chest is symmetrical.  The precordium is quiet.  The first heart sound is normal.  The second heart sound is physiologically split.  There is no murmur gallop rub or click.  There is no abnormal lift or heave.The rhythm is irregular in atrial fibrillation.  The breasts reveal no masses.  The abdomen is soft and nontender.  The bowel sounds are normal.  The liver and spleen are not enlarged.  There are no abdominal masses.  There are no abdominal bruits.  Extremities reveal good pedal pulses.  There is no phlebitis or edema.  There is no cyanosis or clubbing.  Strength is normal and symmetrical in all extremities.  There is no lateralizing weakness.  There are no sensory deficits.  The skin is warm and dry.  There is no rash.     Assessment / Plan: The patient is to continue same medication and be rechecked in 4 months for followup office visit and EKG.  Blood work today is pending including lipid panel

## 2010-08-23 NOTE — Assessment & Plan Note (Signed)
The patient has a history of known ischemic heart disease.  She had CABG on 06/12/00 by Dr. Laneta Simmers.  She has not been expressing any recurrent chest pain.  Her energy level remains good.  She has not had any symptoms of congestive heart failure.

## 2010-08-23 NOTE — Assessment & Plan Note (Signed)
The patient has a history of chronic atrial fibrillation.  In the summer of 2011 she was found to be in atrial fibrillation and she was placed on Coumadin and after a suitable length of time she underwent successful direct current cardioversion at [facility] short stay on 10/23/09.  However she did not stay in sinus rhythm.  By the following week she was back in atrial fibrillation and since then we have allowed her to stay in atrial fibrillation.  The rhythm is not causing her any symptoms.  He has not had any problems from the warfarin.

## 2010-08-23 NOTE — Assessment & Plan Note (Signed)
The patient has a history of hypercholesterolemia.  She eats a very heart healthy diet.  She is intolerant to the statins and has also been intolerant to Zetia Niaspan and WelChol.  We obtained a fasting lipid panel today results pending.

## 2010-08-23 NOTE — Telephone Encounter (Signed)
Faxed refills to [facility] for warfarin and ramipril

## 2010-08-25 ENCOUNTER — Telehealth: Payer: Self-pay | Admitting: *Deleted

## 2010-08-25 NOTE — Telephone Encounter (Signed)
Advised patient of labs 

## 2010-10-18 ENCOUNTER — Telehealth: Payer: Self-pay | Admitting: *Deleted

## 2010-10-18 DIAGNOSIS — E875 Hyperkalemia: Secondary | ICD-10-CM

## 2010-10-18 NOTE — Telephone Encounter (Signed)
K+ high at PCP, advised to discontinue Ramipril.

## 2010-10-18 NOTE — Telephone Encounter (Signed)
Message copied by Burnell Blanks on Mon Oct 18, 2010  5:31 PM ------      Message from: Cassell Clement      Created: Mon Oct 18, 2010 11:52 AM      Regarding: Hyperkalemia       Please let the patient know that I received the results of the blood work from [facility].  Her potassium was too high.  It was also high when we saw her in July but not as high as it is now.  Because of the high potassium I want her to stop her ramipril.  I was asked her to get a followup visit metabolic panel with her primary care doctor in about 2 weeks.  She needs to drink plenty of water.  Signed a copy of this note to Dr. Berenda Morale [facility] at [facility]

## 2010-10-19 NOTE — Telephone Encounter (Signed)
Scheduled recheck of K+ in 2 weeks

## 2010-11-01 ENCOUNTER — Other Ambulatory Visit (INDEPENDENT_AMBULATORY_CARE_PROVIDER_SITE_OTHER): Payer: Medicare Other | Admitting: *Deleted

## 2010-11-01 DIAGNOSIS — E875 Hyperkalemia: Secondary | ICD-10-CM

## 2010-11-01 LAB — BASIC METABOLIC PANEL
Calcium: 8.4 mg/dL (ref 8.4–10.5)
Creatinine, Ser: 0.9 mg/dL (ref 0.4–1.2)

## 2010-11-02 ENCOUNTER — Telehealth: Payer: Self-pay | Admitting: *Deleted

## 2010-11-02 NOTE — Telephone Encounter (Signed)
Just stay on present medicines for now and monitor blood pressure

## 2010-11-02 NOTE — Telephone Encounter (Signed)
Just to stay on present medication and monitor blood pressure

## 2010-11-02 NOTE — Telephone Encounter (Signed)
Advised of labs ? Restart any medications

## 2010-11-02 NOTE — Telephone Encounter (Signed)
Advised will let us know if systolic above 130

## 2010-11-02 NOTE — Telephone Encounter (Signed)
Message copied by Burnell Blanks on Tue Nov 02, 2010  5:22 PM ------      Message from: Cassell Clement      Created: Mon Nov 01, 2010  5:57 PM       Please report.  The K is slightly low now at 3.3 so OK to increase foods high in K slightly.

## 2010-11-02 NOTE — Progress Notes (Signed)
Advised of labs 

## 2010-12-22 ENCOUNTER — Encounter: Payer: Self-pay | Admitting: Cardiology

## 2010-12-22 ENCOUNTER — Ambulatory Visit (INDEPENDENT_AMBULATORY_CARE_PROVIDER_SITE_OTHER): Payer: Medicare Other | Admitting: Cardiology

## 2010-12-22 VITALS — BP 126/78 | HR 66 | Ht 63.0 in | Wt 155.0 lb

## 2010-12-22 DIAGNOSIS — E785 Hyperlipidemia, unspecified: Secondary | ICD-10-CM

## 2010-12-22 DIAGNOSIS — I4891 Unspecified atrial fibrillation: Secondary | ICD-10-CM

## 2010-12-22 DIAGNOSIS — I2510 Atherosclerotic heart disease of native coronary artery without angina pectoris: Secondary | ICD-10-CM

## 2010-12-22 DIAGNOSIS — R079 Chest pain, unspecified: Secondary | ICD-10-CM

## 2010-12-22 LAB — BASIC METABOLIC PANEL
BUN: 15 mg/dL (ref 6–23)
CO2: 25 mEq/L (ref 19–32)
Chloride: 107 mEq/L (ref 96–112)
Glucose, Bld: 87 mg/dL (ref 70–99)
Potassium: 3.8 mEq/L (ref 3.5–5.1)

## 2010-12-22 LAB — HEPATIC FUNCTION PANEL
Bilirubin, Direct: 0.1 mg/dL (ref 0.0–0.3)
Total Bilirubin: 0.8 mg/dL (ref 0.3–1.2)

## 2010-12-22 LAB — LIPID PANEL
HDL: 56.8 mg/dL (ref >39.00)
VLDL: 22.8 mg/dL (ref 0.0–40.0)

## 2010-12-22 MED ORDER — NITROGLYCERIN 0.4 MG SL SUBL: 0.4000 mg | SUBLINGUAL_TABLET | SUBLINGUAL | Status: DC | PRN

## 2010-12-22 NOTE — Assessment & Plan Note (Signed)
The patient has not been expressing any symptoms of congestive heart failure.  No awareness of palpitations.  No TIA symptoms.

## 2010-12-22 NOTE — Assessment & Plan Note (Signed)
No chest pain.  Her nitroglycerin bottle it is more than 75 years old and we called her in a new one today.

## 2010-12-22 NOTE — Assessment & Plan Note (Signed)
Patient has had no complications from her Coumadin.  [name] had no TIA symptoms.

## 2010-12-22 NOTE — Progress Notes (Signed)
Ellen Barajas Date of Birth:  [birth year] [facility] / [facility] [address]   [address] [place], [place]  [address] [phone]           Fax   [phone]  History of Present Illness: This pleasant 77 year old woman is seen for a scheduled followup visit.  She has a history of ischemic heart disease.  She underwent coronary artery bypass graft surgery by Dr. Laneta Simmers in 2002. In 2011 she was found to be in asymptomatic atrial fibrillation.  She was cardioverted on 10/23/09 but 1 week later she was back in atrial fibrillation.  Since she is asymptomatic we have allowed her to stay in atrial fib on Coumadin.  She denies any chest pain or shortness of breath.  Current Outpatient Prescriptions  Medication Sig Dispense Refill  . aspirin 81 MG tablet Take 81 mg by mouth daily.        Marland Kitchen atenolol (TENORMIN) 25 MG tablet Take 25 mg by mouth daily. 1/4 DAILY        . Cholecalciferol (VITAMIN D-3 PO) Take by mouth. Taking 10,000 daily       . COD LIVER OIL PO Take by mouth at bedtime.        . Coenzyme Q10 (COQ10 PO) Take by mouth daily.        . fish oil-omega-3 fatty acids 1000 MG capsule Take by mouth 2 (two) times daily.        . Multiple Vitamin (MULTIVITAMIN) tablet Take 1 tablet by mouth daily.        . Multiple Vitamins-Minerals (EYE VITAMINS) TABS Take by mouth. Take 4 daily       . nitroGLYCERIN (NITROSTAT) 0.4 MG SL tablet Place 1 tablet (0.4 mg total) under the tongue every 5 (five) minutes as needed.  25 tablet  prn  . Red Yeast Rice Extract (RED YEAST RICE PO) Take by mouth 2 (two) times daily.        Marland Kitchen RESVERATROL PO Take by mouth. 4tbl daily       . warfarin (COUMADIN) 5 MG tablet Take 1 tablet (5 mg total) by mouth daily. AS DIRECTED  90 tablet  3    Allergies  Allergen Reactions  . Crestor (Rosuvastatin Calcium)   . Lipitor (Atorvastatin Calcium)   . Niaspan (Niacin (Antihyperlipidemic))   . Pravastatin   . Ramipril (Altace)     Hyperkalemia  . Welchol  (Colesevelam Hcl)   . Zetia (Ezetimibe)     Patient Active Problem List  Diagnoses  . Atrial fibrillation  . AF (atrial fibrillation)  . IHD (ischemic heart disease)  . Dyslipidemia  . Coronary artery disease    History  Smoking status  . Never Smoker   Smokeless tobacco  . Not on file    History  Alcohol Use No    No family history on file.  Review of Systems: Constitutional: no fever chills diaphoresis or fatigue or change in weight.  Head and neck: no hearing loss, no epistaxis, no photophobia or visual disturbance. Respiratory: No cough, shortness of breath or wheezing. Cardiovascular: No chest pain peripheral edema, palpitations. Gastrointestinal: No abdominal distention, no abdominal pain, no change in bowel habits hematochezia or melena. Genitourinary: No dysuria, no frequency, no urgency, no nocturia. Musculoskeletal:No arthralgias, no back pain, no gait disturbance or myalgias. Neurological: No dizziness, no headaches, no numbness, no seizures, no syncope, no weakness, no tremors. Hematologic: No lymphadenopathy, no easy bruising. Psychiatric: No confusion, no hallucinations, no sleep disturbance.  Physical Exam: Filed Vitals:   12/22/10 1040  BP: 126/78  Pulse: 66   the general appearance reveals a well-developed well-nourished woman in no distress.The head and neck exam reveals pupils equal and reactive.  Extraocular movements are full.  There is no scleral icterus.  The mouth and pharynx are normal.  The neck is supple.  The carotids reveal no bruits.  The jugular venous pressure is normal.  The  thyroid is not enlarged.  There is no lymphadenopathy.  The chest is clear to percussion and auscultation.  There are no rales or rhonchi.  Expansion of the chest is symmetrical.  The precordium is quiet.  The first heart sound is normal.  The second heart sound is physiologically split.  There is no murmur gallop rub or click.  The pulse is irregular in atrial  fibrillation  There is no abnormal lift or heave.  The breasts reveal no masses. The abdomen is soft and nontender.  The bowel sounds are normal.  The liver and spleen are not enlarged.  There are no abdominal masses.  There are no abdominal bruits.  Extremities reveal good pedal pulses.  There is no phlebitis or edema.  There is no cyanosis or clubbing.  Strength is normal and symmetrical in all extremities.  There is no lateralizing weakness.  There are no sensory deficits.  The skin is warm and dry.  There is no rash.  EKG today shows atrial fibrillation with a controlled ventricular response and no ischemic changes.   Assessment / Plan: Continue same medication.  Blood work today pending.  Recheck in 4 months for followup office visit and lab work

## 2010-12-22 NOTE — Assessment & Plan Note (Signed)
The patient has a history of high cholesterol but is unable to tolerate any of the prescribed lipid-lowering agents.  She is using some natural pathic medication.  Blood work today is pending

## 2010-12-22 NOTE — Patient Instructions (Signed)
Will refill your NTG to [facility] Will obtain labs today and call you with the results Your physician recommends that you continue on your current medications as directed. Please refer to the Current Medication list given to you today. Your physician recommends that you schedule a follow-up appointment in: 4 months with fasting labs (lp/bmet/hfp)

## 2011-01-25 HISTORY — PX: CATARACT EXTRACTION W/ INTRAOCULAR LENS  IMPLANT, BILATERAL: SHX1307

## 2011-04-18 ENCOUNTER — Other Ambulatory Visit (INDEPENDENT_AMBULATORY_CARE_PROVIDER_SITE_OTHER): Payer: Medicare Other

## 2011-04-18 DIAGNOSIS — E785 Hyperlipidemia, unspecified: Secondary | ICD-10-CM

## 2011-04-18 LAB — BASIC METABOLIC PANEL WITH GFR
BUN: 18 mg/dL (ref 6–23)
CO2: 29 meq/L (ref 19–32)
Calcium: 9.2 mg/dL (ref 8.4–10.5)
Chloride: 109 meq/L (ref 96–112)
Creatinine, Ser: 0.9 mg/dL (ref 0.4–1.2)
GFR: 65.61 mL/min (ref >60.00)
Glucose, Bld: 90 mg/dL (ref 70–99)
Potassium: 4.1 meq/L (ref 3.5–5.1)
Sodium: 142 meq/L (ref 135–145)

## 2011-04-18 LAB — LIPID PANEL
HDL: 51.7 mg/dL (ref >39.00)
Total CHOL/HDL Ratio: 4
VLDL: 25.6 mg/dL (ref 0.0–40.0)

## 2011-04-18 LAB — HEPATIC FUNCTION PANEL
ALT: 26 U/L (ref 0–35)
Total Bilirubin: 0.5 mg/dL (ref 0.3–1.2)

## 2011-04-18 NOTE — Progress Notes (Signed)
Quick Note:  Please make copy of labs for patient visit. ______ 

## 2011-04-20 ENCOUNTER — Ambulatory Visit (INDEPENDENT_AMBULATORY_CARE_PROVIDER_SITE_OTHER): Payer: Medicare Other | Admitting: Cardiology

## 2011-04-20 ENCOUNTER — Ambulatory Visit (INDEPENDENT_AMBULATORY_CARE_PROVIDER_SITE_OTHER): Payer: Medicare Other | Admitting: Pharmacist

## 2011-04-20 ENCOUNTER — Encounter: Payer: Self-pay | Admitting: Cardiology

## 2011-04-20 VITALS — BP 120/70 | HR 78 | Ht 63.0 in | Wt 158.0 lb

## 2011-04-20 DIAGNOSIS — I4891 Unspecified atrial fibrillation: Secondary | ICD-10-CM

## 2011-04-20 DIAGNOSIS — E785 Hyperlipidemia, unspecified: Secondary | ICD-10-CM

## 2011-04-20 DIAGNOSIS — I259 Chronic ischemic heart disease, unspecified: Secondary | ICD-10-CM

## 2011-04-20 LAB — POCT INR: INR: 1.9

## 2011-04-20 MED ORDER — ATENOLOL 25 MG PO TABS: 25.0000 mg | ORAL_TABLET | Freq: Every day | ORAL | Status: DC

## 2011-04-20 NOTE — Progress Notes (Signed)
Ellen Barajas Date of Birth:  Feb 23, 1928 [facility] [address] [place], [place]  [address] [phone]         Fax   [phone]  History of Present Illness: This pleasant 78 year old woman is seen for a scheduled followup office visit.  She has a history of ischemic heart disease and is status post CABG.  She also has a history of established chronic atrial fibrillation and is on Coumadin.  She was cardioverted out of atrial fibrillation on 10/23/09 but one week later was back in atrial fib and since she was asymptomatic we have not made further attempts at getting her back into normal sinus rhythm  Current Outpatient Prescriptions  Medication Sig Dispense Refill  . aspirin 81 MG tablet Take 81 mg by mouth daily.        Marland Kitchen atenolol (TENORMIN) 25 MG tablet Take 1 tablet (25 mg total) by mouth daily. 1/4 DAILY  90 tablet  3  . Cholecalciferol (VITAMIN D-3 PO) Take by mouth. Taking 10,000 daily       . COD LIVER OIL PO Take by mouth at bedtime.        . Coenzyme Q10 (COQ10 PO) Take by mouth daily.        . fish oil-omega-3 fatty acids 1000 MG capsule Take by mouth 2 (two) times daily.        . Multiple Vitamin (MULTIVITAMIN) tablet Take 1 tablet by mouth daily.        . Multiple Vitamins-Minerals (EYE VITAMINS) TABS Take by mouth. Take 4 daily       . nitroGLYCERIN (NITROSTAT) 0.4 MG SL tablet Place 1 tablet (0.4 mg total) under the tongue every 5 (five) minutes as needed.  25 tablet  prn  . Red Yeast Rice Extract (RED YEAST RICE PO) Take by mouth 2 (two) times daily.        Marland Kitchen RESVERATROL PO Take by mouth. 4tbl daily       . warfarin (COUMADIN) 5 MG tablet Take 1 tablet (5 mg total) by mouth daily. AS DIRECTED  90 tablet  3  . DISCONTD: atenolol (TENORMIN) 25 MG tablet Take 25 mg by mouth daily. 1/4 DAILY        . DISCONTD: atenolol (TENORMIN) 25 MG tablet Take 1 tablet (25 mg total) by mouth daily. 1/4 DAILY  90 tablet  3    Allergies  Allergen Reactions  .  Crestor (Rosuvastatin Calcium)   . Lipitor (Atorvastatin Calcium)   . Niaspan (Niacin (Antihyperlipidemic))   . Pravastatin   . Ramipril (Altace)     Hyperkalemia  . Welchol (Colesevelam Hcl)   . Zetia (Ezetimibe)     Patient Active Problem List  Diagnoses  . Atrial fibrillation  . AF (atrial fibrillation)  . IHD (ischemic heart disease)  . Dyslipidemia  . Coronary artery disease    History  Smoking status  . Never Smoker   Smokeless tobacco  . Not on file    History  Alcohol Use No    No family history on file.  Review of Systems: Constitutional: no fever chills diaphoresis or fatigue or change in weight.  Head and neck: no hearing loss, no epistaxis, no photophobia or visual disturbance. Respiratory: No cough, shortness of breath or wheezing. Cardiovascular: No chest pain peripheral edema, palpitations. Gastrointestinal: No abdominal distention, no abdominal pain, no change in bowel habits hematochezia or melena. Genitourinary: No dysuria, no frequency, no urgency, no nocturia. Musculoskeletal:No arthralgias, no back pain,  no gait disturbance or myalgias. Neurological: No dizziness, no headaches, no numbness, no seizures, no syncope, no weakness, no tremors. Hematologic: No lymphadenopathy, no easy bruising. Psychiatric: No confusion, no hallucinations, no sleep disturbance.    Physical Exam: Filed Vitals:   04/20/11 1029  BP: 120/70  Pulse: 78   the general appearance reveals a well-developed well-nourished woman in no distress.Pupils equal and reactive.   Extraocular Movements are full.  There is no scleral icterus.  The mouth and pharynx are normal.  The neck is supple.  The carotids reveal no bruits.  The jugular venous pressure is normal.  The thyroid is not enlarged.  There is no lymphadenopathy.  The chest is clear to percussion and auscultation. There are no rales or rhonchi. Expansion of the chest is symmetrical.  The precordium is quiet.  The first  heart sound is normal.  The second heart sound is physiologically split.  There is no murmur gallop rub or click.  There is no abnormal lift or heave.  Rhythm is slow and irregular The abdomen is soft and nontender. Bowel sounds are normal. The liver and spleen are not enlarged. There Are no abdominal masses. There are no bruits.  Extremities no phlebitis or edema.The skin is warm and dry.  There is no rash. Strength is normal and symmetrical in all extremities.  There is no lateralizing weakness.  There are no sensory deficits.       Assessment / Plan: Continue Coumadin dosing as per Coumadin clinic.  Continue other medicines the same.  Recheck in 4 months for followup office visit and EKG

## 2011-04-20 NOTE — Assessment & Plan Note (Signed)
The patient has a history of dyslipidemia.  She is treating this with over-the-counter medications.  She is allergic to all the statins and other prescription medications used for her elevated cholesterol.  This includes WelChol and ezetimibe.

## 2011-04-20 NOTE — Assessment & Plan Note (Signed)
The patient has not had any TIA symptoms.  In discussing her Coumadin with her today it was discovered that she had never transitioned into our Coumadin clinic and had not had any protimes drawn since July 2012.  We sent her to the Coumadin clinic today where her INR was found to be slightly subtherapeutic.

## 2011-04-20 NOTE — Patient Instructions (Signed)
Your physician recommends that you continue on your current medications as directed. Please refer to the Current Medication list given to you today.  Your physician recommends that you schedule a follow-up appointment in: 4 months with Dr. Brackbill.  You will be seeing the coumadin clinic today.   They will monitor and dose your coumadin for you. 

## 2011-04-20 NOTE — Assessment & Plan Note (Signed)
The patient had a history of ischemic heart disease and had coronary artery bypass graft surgery in 2002.  She has done well post surgery and has had recurrence of angina pectoris.

## 2011-04-21 ENCOUNTER — Other Ambulatory Visit: Payer: Self-pay | Admitting: *Deleted

## 2011-04-21 MED ORDER — ATENOLOL 25 MG PO TABS: 25.0000 mg | ORAL_TABLET | Freq: Every day | ORAL | Status: DC

## 2011-04-21 NOTE — Telephone Encounter (Signed)
Refilled atenolol

## 2011-04-25 ENCOUNTER — Other Ambulatory Visit: Payer: Self-pay | Admitting: *Deleted

## 2011-04-26 ENCOUNTER — Telehealth: Payer: Self-pay | Admitting: Cardiology

## 2011-04-26 ENCOUNTER — Other Ambulatory Visit: Payer: Self-pay | Admitting: *Deleted

## 2011-04-26 NOTE — Telephone Encounter (Signed)
Please return call to patient at [phone]   Patient would like to discuss medication with nurse, she can be reached at [phone].

## 2011-04-26 NOTE — Telephone Encounter (Signed)
Called and clarified Rx for Atenolol with mail order pharmacy

## 2011-05-25 ENCOUNTER — Ambulatory Visit (INDEPENDENT_AMBULATORY_CARE_PROVIDER_SITE_OTHER): Payer: Medicare Other | Admitting: Pharmacist

## 2011-05-25 DIAGNOSIS — I4891 Unspecified atrial fibrillation: Secondary | ICD-10-CM

## 2011-06-22 ENCOUNTER — Ambulatory Visit (INDEPENDENT_AMBULATORY_CARE_PROVIDER_SITE_OTHER): Payer: Medicare Other | Admitting: *Deleted

## 2011-06-22 DIAGNOSIS — I4891 Unspecified atrial fibrillation: Secondary | ICD-10-CM

## 2011-06-22 LAB — POCT INR: INR: 2.8

## 2011-07-20 ENCOUNTER — Ambulatory Visit (INDEPENDENT_AMBULATORY_CARE_PROVIDER_SITE_OTHER): Payer: Medicare Other | Admitting: *Deleted

## 2011-07-20 DIAGNOSIS — I4891 Unspecified atrial fibrillation: Secondary | ICD-10-CM

## 2011-07-20 LAB — POCT INR: INR: 2.1

## 2011-08-19 ENCOUNTER — Encounter: Payer: Self-pay | Admitting: Cardiology

## 2011-08-19 ENCOUNTER — Ambulatory Visit (INDEPENDENT_AMBULATORY_CARE_PROVIDER_SITE_OTHER): Payer: Medicare Other | Admitting: *Deleted

## 2011-08-19 ENCOUNTER — Telehealth: Payer: Self-pay | Admitting: *Deleted

## 2011-08-19 ENCOUNTER — Ambulatory Visit (INDEPENDENT_AMBULATORY_CARE_PROVIDER_SITE_OTHER): Payer: Medicare Other | Admitting: Cardiology

## 2011-08-19 VITALS — BP 136/70 | HR 91 | Ht 63.0 in | Wt 158.0 lb

## 2011-08-19 DIAGNOSIS — E785 Hyperlipidemia, unspecified: Secondary | ICD-10-CM

## 2011-08-19 DIAGNOSIS — I4891 Unspecified atrial fibrillation: Secondary | ICD-10-CM

## 2011-08-19 DIAGNOSIS — I2510 Atherosclerotic heart disease of native coronary artery without angina pectoris: Secondary | ICD-10-CM

## 2011-08-19 DIAGNOSIS — Z951 Presence of aortocoronary bypass graft: Secondary | ICD-10-CM

## 2011-08-19 DIAGNOSIS — E7800 Pure hypercholesterolemia, unspecified: Secondary | ICD-10-CM

## 2011-08-19 LAB — LIPID PANEL
Cholesterol: 191 mg/dL (ref 0–200)
HDL: 56.3 mg/dL (ref >39.00)
Total CHOL/HDL Ratio: 3
Triglycerides: 78 mg/dL (ref 0.0–149.0)

## 2011-08-19 LAB — HEPATIC FUNCTION PANEL
ALT: 24 U/L (ref 0–35)
AST: 20 U/L (ref 0–37)
Albumin: 3.9 g/dL (ref 3.5–5.2)
Alkaline Phosphatase: 76 U/L (ref 39–117)
Total Protein: 6.8 g/dL (ref 6.0–8.3)

## 2011-08-19 LAB — BASIC METABOLIC PANEL
Calcium: 9.3 mg/dL (ref 8.4–10.5)
Creatinine, Ser: 0.8 mg/dL (ref 0.4–1.2)
GFR: 72.13 mL/min (ref >60.00)
Sodium: 141 mEq/L (ref 135–145)

## 2011-08-19 NOTE — Telephone Encounter (Signed)
Advised of labs 

## 2011-08-19 NOTE — Assessment & Plan Note (Signed)
The patient has not had any thromboembolic events.  No TIA or stroke symptoms.  Is not having any chest pain or shortness of breath.  No dizziness or syncope.

## 2011-08-19 NOTE — Assessment & Plan Note (Signed)
She has not had to take any recent sublingual nitroglycerin. 

## 2011-08-19 NOTE — Patient Instructions (Signed)
Will obtain labs today and call you with the results  Your physician recommends that you continue on your current medications as directed. Please refer to the Current Medication list given to you today.  Your physician recommends that you schedule a follow-up appointment in: 4 months with fasting labs (lp/bmet/hfp)  

## 2011-08-19 NOTE — Progress Notes (Signed)
Ellen Barajas Date of Birth:  September 15, 1928 [facility] [address] [place], [place]  [address] [phone]         Fax   [phone]  History of Present Illness: This pleasant 78 year old woman is seen for a scheduled four-month followup office visit.  She has a history of ischemic heart disease.  She has had previous CABG.  [name] been atrial fibrillation.  She underwent cardioversion on 10/23/09 but 1 week later was back in atrial fibrillation.  We have made no further attempts at cardioversion.  She is essentially asymptomatic with her rhythm.  She has a history of hypercholesterolemia and is on over-the-counter preparations.  We are checking lipids today  Current Outpatient Prescriptions  Medication Sig Dispense Refill  . aspirin 81 MG tablet Take 81 mg by mouth daily.        Marland Kitchen atenolol (TENORMIN) 25 MG tablet Take 25 mg by mouth. 1/4 DAILY      . Cholecalciferol (VITAMIN D-3 PO) Take by mouth. Taking 10,000 daily       . COD LIVER OIL PO Take by mouth at bedtime.        . Coenzyme Q10 (COQ10 PO) Take by mouth daily.        . fish oil-omega-3 fatty acids 1000 MG capsule Take by mouth 2 (two) times daily.        . Multiple Vitamin (MULTIVITAMIN) tablet Take 1 tablet by mouth daily.        . Multiple Vitamins-Minerals (EYE VITAMINS) TABS Take by mouth. Take 4 daily       . nitroGLYCERIN (NITROSTAT) 0.4 MG SL tablet Place 1 tablet (0.4 mg total) under the tongue every 5 (five) minutes as needed.  25 tablet  prn  . Red Yeast Rice Extract (RED YEAST RICE PO) Take by mouth 2 (two) times daily.        Marland Kitchen RESVERATROL PO Take by mouth. 4tbl daily       . warfarin (COUMADIN) 5 MG tablet Take 1 tablet (5 mg total) by mouth daily. AS DIRECTED  90 tablet  3    Allergies  Allergen Reactions  . Crestor (Rosuvastatin Calcium)   . Lipitor (Atorvastatin Calcium)   . Niaspan (Niacin Er)   . Pravastatin   . Ramipril (Ramipril)     Hyperkalemia  . Welchol (Colesevelam Hcl)   .  Zetia (Ezetimibe)     Patient Active Problem List  Diagnosis  . Atrial fibrillation  . AF (atrial fibrillation)  . IHD (ischemic heart disease)  . Dyslipidemia  . Coronary artery disease    History  Smoking status  . Never Smoker   Smokeless tobacco  . Not on file    History  Alcohol Use No    No family history on file.  Review of Systems: Constitutional: no fever chills diaphoresis or fatigue or change in weight.  Head and neck: no hearing loss, no epistaxis, no photophobia or visual disturbance. Respiratory: No cough, shortness of breath or wheezing. Cardiovascular: No chest pain peripheral edema, palpitations. Gastrointestinal: No abdominal distention, no abdominal pain, no change in bowel habits hematochezia or melena. Genitourinary: No dysuria, no frequency, no urgency, no nocturia. Musculoskeletal:No arthralgias, no back pain, no gait disturbance or myalgias. Neurological: No dizziness, no headaches, no numbness, no seizures, no syncope, no weakness, no tremors. Hematologic: No lymphadenopathy, no easy bruising. Psychiatric: No confusion, no hallucinations, no sleep disturbance.    Physical Exam: Filed Vitals:   08/19/11 1029  BP:  136/70  Pulse: 91   the general appearance reveals a well-developed well-nourished woman in no distress.Pupils equal and reactive.   Extraocular Movements are full.  There is no scleral icterus.  The mouth and pharynx are normal.  The neck is supple.  The carotids reveal no bruits.  The jugular venous pressure is normal.  The thyroid is not enlarged.  There is no lymphadenopathy.  The chest is clear to percussion and auscultation. There are no rales or rhonchi. Expansion of the chest is symmetrical.  The precordium is quiet.  The first heart sound is normal.  The second heart sound is physiologically split.  There is no murmur gallop rub or click.  There is no abnormal lift or heave.  Rhythm is irregular The abdomen is soft and  nontender. Bowel sounds are normal. The liver and spleen are not enlarged. There Are no abdominal masses. There are no bruits.  The pedal pulses are good.  There is no phlebitis or edema.  There is no cyanosis or clubbing. Strength is normal and symmetrical in all extremities.  There is no lateralizing weakness.  There are no sensory deficits.  The skin is warm and dry.  There is no rash.   EKG shows atrial fibrillation with a controlled ventricular response.  No ischemic changes.   Assessment / Plan: Continue same medication.  Blood per clinic pending.  Recheck in 4 months for office visit and fasting lab work

## 2011-08-19 NOTE — Assessment & Plan Note (Signed)
The patient is being careful with her diet because of her hypercholesterolemia.  [name] also careful about her vitamin K ingestion because of her chronic Coumadin anticoagulation.  We are checking fasting lab work today

## 2011-08-19 NOTE — Progress Notes (Signed)
Quick Note:  Please report to patient. The recent labs are stable. Continue same medication and careful diet. ______ 

## 2011-08-19 NOTE — Telephone Encounter (Signed)
Message copied by Burnell Blanks on Fri Aug 19, 2011  2:12 PM ------      Message from: Cassell Clement      Created: Fri Aug 19, 2011  2:02 PM       Please report to patient.  The recent labs are stable. Continue same medication and careful diet.

## 2011-09-05 ENCOUNTER — Ambulatory Visit (INDEPENDENT_AMBULATORY_CARE_PROVIDER_SITE_OTHER): Payer: Medicare Other | Admitting: *Deleted

## 2011-09-05 DIAGNOSIS — I4891 Unspecified atrial fibrillation: Secondary | ICD-10-CM

## 2011-09-13 ENCOUNTER — Encounter (INDEPENDENT_AMBULATORY_CARE_PROVIDER_SITE_OTHER): Payer: Medicare Other | Admitting: Ophthalmology

## 2011-09-13 DIAGNOSIS — H3530 Unspecified macular degeneration: Secondary | ICD-10-CM

## 2011-09-13 DIAGNOSIS — H353290 Exudative age-related macular degeneration, unspecified eye, stage unspecified: Secondary | ICD-10-CM

## 2011-09-13 DIAGNOSIS — H43819 Vitreous degeneration, unspecified eye: Secondary | ICD-10-CM

## 2011-09-13 DIAGNOSIS — H33309 Unspecified retinal break, unspecified eye: Secondary | ICD-10-CM

## 2011-09-16 ENCOUNTER — Ambulatory Visit (INDEPENDENT_AMBULATORY_CARE_PROVIDER_SITE_OTHER): Payer: Medicare Other | Admitting: Ophthalmology

## 2011-09-16 DIAGNOSIS — H353290 Exudative age-related macular degeneration, unspecified eye, stage unspecified: Secondary | ICD-10-CM

## 2011-09-16 DIAGNOSIS — H33309 Unspecified retinal break, unspecified eye: Secondary | ICD-10-CM

## 2011-09-16 DIAGNOSIS — H43819 Vitreous degeneration, unspecified eye: Secondary | ICD-10-CM

## 2011-09-16 DIAGNOSIS — H3530 Unspecified macular degeneration: Secondary | ICD-10-CM

## 2011-09-23 ENCOUNTER — Ambulatory Visit (INDEPENDENT_AMBULATORY_CARE_PROVIDER_SITE_OTHER): Payer: Medicare Other | Admitting: Pharmacist

## 2011-09-23 DIAGNOSIS — I4891 Unspecified atrial fibrillation: Secondary | ICD-10-CM

## 2011-09-23 LAB — POCT INR: INR: 2.1

## 2011-10-11 ENCOUNTER — Encounter (INDEPENDENT_AMBULATORY_CARE_PROVIDER_SITE_OTHER): Payer: Medicare Other | Admitting: Ophthalmology

## 2011-10-11 DIAGNOSIS — H353290 Exudative age-related macular degeneration, unspecified eye, stage unspecified: Secondary | ICD-10-CM

## 2011-10-11 DIAGNOSIS — H43819 Vitreous degeneration, unspecified eye: Secondary | ICD-10-CM

## 2011-10-11 DIAGNOSIS — H3530 Unspecified macular degeneration: Secondary | ICD-10-CM

## 2011-11-15 ENCOUNTER — Encounter (INDEPENDENT_AMBULATORY_CARE_PROVIDER_SITE_OTHER): Payer: Medicare Other | Admitting: Ophthalmology

## 2011-11-15 DIAGNOSIS — H43819 Vitreous degeneration, unspecified eye: Secondary | ICD-10-CM

## 2011-11-15 DIAGNOSIS — H33309 Unspecified retinal break, unspecified eye: Secondary | ICD-10-CM

## 2011-11-15 DIAGNOSIS — H3530 Unspecified macular degeneration: Secondary | ICD-10-CM

## 2011-11-15 DIAGNOSIS — H353290 Exudative age-related macular degeneration, unspecified eye, stage unspecified: Secondary | ICD-10-CM

## 2011-11-28 ENCOUNTER — Ambulatory Visit (INDEPENDENT_AMBULATORY_CARE_PROVIDER_SITE_OTHER): Payer: Medicare Other | Admitting: *Deleted

## 2011-11-28 DIAGNOSIS — I4891 Unspecified atrial fibrillation: Secondary | ICD-10-CM

## 2011-11-28 LAB — POCT INR: INR: 2

## 2011-12-15 ENCOUNTER — Other Ambulatory Visit: Payer: Self-pay | Admitting: *Deleted

## 2011-12-15 DIAGNOSIS — E785 Hyperlipidemia, unspecified: Secondary | ICD-10-CM

## 2011-12-15 DIAGNOSIS — I2510 Atherosclerotic heart disease of native coronary artery without angina pectoris: Secondary | ICD-10-CM

## 2011-12-15 DIAGNOSIS — I4891 Unspecified atrial fibrillation: Secondary | ICD-10-CM

## 2011-12-16 ENCOUNTER — Ambulatory Visit (INDEPENDENT_AMBULATORY_CARE_PROVIDER_SITE_OTHER): Payer: Medicare Other

## 2011-12-16 ENCOUNTER — Other Ambulatory Visit: Payer: Self-pay | Admitting: *Deleted

## 2011-12-16 ENCOUNTER — Ambulatory Visit (INDEPENDENT_AMBULATORY_CARE_PROVIDER_SITE_OTHER): Payer: Medicare Other | Admitting: Cardiology

## 2011-12-16 ENCOUNTER — Encounter: Payer: Self-pay | Admitting: Cardiology

## 2011-12-16 ENCOUNTER — Other Ambulatory Visit (INDEPENDENT_AMBULATORY_CARE_PROVIDER_SITE_OTHER): Payer: Medicare Other

## 2011-12-16 VITALS — BP 144/70 | HR 54 | Resp 18 | Ht 63.0 in | Wt 157.0 lb

## 2011-12-16 DIAGNOSIS — E785 Hyperlipidemia, unspecified: Secondary | ICD-10-CM

## 2011-12-16 DIAGNOSIS — I4891 Unspecified atrial fibrillation: Secondary | ICD-10-CM

## 2011-12-16 DIAGNOSIS — I2510 Atherosclerotic heart disease of native coronary artery without angina pectoris: Secondary | ICD-10-CM

## 2011-12-16 DIAGNOSIS — R0989 Other specified symptoms and signs involving the circulatory and respiratory systems: Secondary | ICD-10-CM

## 2011-12-16 DIAGNOSIS — E7800 Pure hypercholesterolemia, unspecified: Secondary | ICD-10-CM

## 2011-12-16 LAB — HEPATIC FUNCTION PANEL
ALT: 20 U/L (ref 0–35)
AST: 23 U/L (ref 0–37)
Alkaline Phosphatase: 78 U/L (ref 39–117)
Bilirubin, Direct: 0.1 mg/dL (ref 0.0–0.3)
Total Bilirubin: 0.7 mg/dL (ref 0.3–1.2)
Total Protein: 7.1 g/dL (ref 6.0–8.3)

## 2011-12-16 LAB — LIPID PANEL
Cholesterol: 220 mg/dL — ABNORMAL HIGH (ref 0–200)
Total CHOL/HDL Ratio: 4
Triglycerides: 132 mg/dL (ref 0.0–149.0)

## 2011-12-16 LAB — BASIC METABOLIC PANEL
BUN: 22 mg/dL (ref 6–23)
Creatinine, Ser: 0.8 mg/dL (ref 0.4–1.2)
GFR: 70.07 mL/min (ref >60.00)
Glucose, Bld: 96 mg/dL (ref 70–99)
Potassium: 4.4 mEq/L (ref 3.5–5.1)

## 2011-12-16 LAB — LDL CHOLESTEROL, DIRECT: Direct LDL: 154.6 mg/dL

## 2011-12-16 LAB — POCT INR: INR: 3.6

## 2011-12-16 MED ORDER — WARFARIN SODIUM 5 MG PO TABS: ORAL_TABLET | ORAL | Status: DC

## 2011-12-16 MED ORDER — ATENOLOL 25 MG PO TABS: 25.0000 mg | ORAL_TABLET | Freq: Every day | ORAL | Status: DC

## 2011-12-16 NOTE — Assessment & Plan Note (Signed)
Patient has not been experiencing any TIA symptoms.  She has not had any symptoms of congestive heart failure and her exercise tolerance is good.  Her rate is well controlled on current medication

## 2011-12-16 NOTE — Assessment & Plan Note (Signed)
Patient has a history of dyslipidemia.  She is not on any prescription medications or statins.  She takes resveratrol and  red yeast rice.

## 2011-12-16 NOTE — Assessment & Plan Note (Signed)
The patient has not been experiencing any angina pectoris. 

## 2011-12-16 NOTE — Patient Instructions (Addendum)
Will have you get your Coumadin checked today  Will obtain labs today and call you with the results (lp/bmet/hfp)  Your physician recommends that you continue on your current medications as directed. Please refer to the Current Medication list given to you today.  Your physician recommends that you schedule a follow-up appointment in: 4 months with fasting labs (lp/bmet/hfp)

## 2011-12-16 NOTE — Progress Notes (Signed)
Ellen Barajas Date of Birth:  05-Aug-1928 [facility] [address] [place], [place]  [address] [phone]         Fax   [phone]  History of Present Illness: This pleasant 78 year old woman is seen for a scheduled four-month followup office visit. She has a history of ischemic heart disease. She has had previous CABG. [name] been atrial fibrillation. She underwent cardioversion on 10/23/09 but 1 week later was back in atrial fibrillation. We have made no further attempts at cardioversion. She is essentially asymptomatic with her rhythm. She has a history of hypercholesterolemia and is on over-the-counter preparations. We are checking lipids today.  Since last visit she has had no new cardiac symptoms.  She remains on long-term Coumadin   Current Outpatient Prescriptions  Medication Sig Dispense Refill  . aspirin 81 MG tablet Take 81 mg by mouth daily.        Marland Kitchen atenolol (TENORMIN) 25 MG tablet Take 25 mg by mouth. 1/4 DAILY      . Cholecalciferol (VITAMIN D-3 PO) Take by mouth. Taking 10,000 daily       . COD LIVER OIL PO Take by mouth at bedtime.        . Coenzyme Q10 (COQ10 PO) Take by mouth daily.        . fish oil-omega-3 fatty acids 1000 MG capsule Take by mouth 2 (two) times daily.        . Multiple Vitamin (MULTIVITAMIN) tablet Take 1 tablet by mouth daily.        . Multiple Vitamins-Minerals (EYE VITAMINS) TABS Take by mouth. Take 4 daily       . nitroGLYCERIN (NITROSTAT) 0.4 MG SL tablet Place 1 tablet (0.4 mg total) under the tongue every 5 (five) minutes as needed.  25 tablet  prn  . Red Yeast Rice Extract (RED YEAST RICE PO) Take by mouth 2 (two) times daily.        Marland Kitchen RESVERATROL PO Take by mouth. 4tbl daily       . [DISCONTINUED] warfarin (COUMADIN) 5 MG tablet Take 1 tablet (5 mg total) by mouth daily. AS DIRECTED  90 tablet  3  . warfarin (COUMADIN) 5 MG tablet Take as directed by anticoagulation clinic  90 tablet  1    Allergies  Allergen Reactions   . Crestor (Rosuvastatin Calcium)   . Lipitor (Atorvastatin Calcium)   . Niaspan (Niacin Er)   . Pravastatin   . Ramipril (Ramipril)     Hyperkalemia  . Welchol (Colesevelam Hcl)   . Zetia (Ezetimibe)     Patient Active Problem List  Diagnosis  . Atrial fibrillation  . AF (atrial fibrillation)  . IHD (ischemic heart disease)  . Dyslipidemia  . Coronary artery disease    History  Smoking status  . Never Smoker   Smokeless tobacco  . Not on file    History  Alcohol Use No    No family history on file.  Review of Systems: Constitutional: no fever chills diaphoresis or fatigue or change in weight.  Head and neck: no hearing loss, no epistaxis, no photophobia or visual disturbance. Respiratory: No cough, shortness of breath or wheezing. Cardiovascular: No chest pain peripheral edema, palpitations. Gastrointestinal: No abdominal distention, no abdominal pain, no change in bowel habits hematochezia or melena. Genitourinary: No dysuria, no frequency, no urgency, no nocturia. Musculoskeletal:No arthralgias, no back pain, no gait disturbance or myalgias. Neurological: No dizziness, no headaches, no numbness, no seizures, no syncope, no weakness,  no tremors. Hematologic: No lymphadenopathy, no easy bruising. Psychiatric: No confusion, no hallucinations, no sleep disturbance.    Physical Exam: Filed Vitals:   12/16/11 0945  BP: 144/70  Pulse: 54  Resp: 18   the general appearance reveals a well-developed well-nourished woman in no distress.The head and neck exam reveals pupils equal and reactive.  Extraocular movements are full.  There is no scleral icterus.  The mouth and pharynx are normal.  The neck is supple.  The carotids reveal no bruits.  The jugular venous pressure is normal.  The  thyroid is not enlarged.  There is no lymphadenopathy.  The chest is clear to percussion and auscultation.  There are no rales or rhonchi.  Expansion of the chest is symmetrical.  The  precordium is quiet.  The pulse is irregularly irregular  The first heart sound is normal.  The second heart sound is physiologically split.  There is no murmur gallop rub or click.  There is no abnormal lift or heave.  The abdomen is soft and nontender.  The bowel sounds are normal.  The liver and spleen are not enlarged.  There are no abdominal masses.  There are no abdominal bruits.  Extremities reveal good pedal pulses.  There is no phlebitis or edema.  There is no cyanosis or clubbing.  Strength is normal and symmetrical in all extremities.  There is no lateralizing weakness.  There are no sensory deficits.  The skin is warm and dry.  There is no rash.     Assessment / Plan: Continue same medication.  Recheck in 4 months for followup office visit lipid panel hepatic function panel and basal metabolic panel.  Continue careful diet.  Her weight is down 1 pound since last visit.

## 2011-12-18 NOTE — Progress Notes (Signed)
Quick Note:  Please report to patient. The recent labs are stable. Continue same medication and careful diet. Cholesterol and LDL are still high so keep working hard on low cholesterol diet. ______

## 2011-12-19 ENCOUNTER — Telehealth: Payer: Self-pay | Admitting: *Deleted

## 2011-12-19 ENCOUNTER — Other Ambulatory Visit: Payer: Self-pay

## 2011-12-19 MED ORDER — ATENOLOL 25 MG PO TABS: 25.0000 mg | ORAL_TABLET | Freq: Every day | ORAL | Status: DC

## 2011-12-19 NOTE — Telephone Encounter (Signed)
Message copied by Burnell Blanks on Mon Dec 19, 2011  5:16 PM ------      Message from: Cassell Clement      Created: Sun Dec 18, 2011  6:01 PM       Please report to patient.  The recent labs are stable. Continue same medication and careful diet. Cholesterol and LDL are still high so keep working hard on low cholesterol diet.

## 2011-12-19 NOTE — Telephone Encounter (Signed)
Message copied by Burnell Blanks on Mon Dec 19, 2011  5:18 PM ------      Message from: Cassell Clement      Created: Sun Dec 18, 2011  6:01 PM       Please report to patient.  The recent labs are stable. Continue same medication and careful diet. Cholesterol and LDL are still high so keep working hard on low cholesterol diet.

## 2011-12-19 NOTE — Telephone Encounter (Signed)
Advised and mailed copy of labs 

## 2011-12-20 ENCOUNTER — Other Ambulatory Visit: Payer: Self-pay

## 2011-12-20 MED ORDER — ATENOLOL 25 MG PO TABS: 25.0000 mg | ORAL_TABLET | Freq: Every day | ORAL | Status: DC

## 2011-12-21 ENCOUNTER — Other Ambulatory Visit: Payer: Self-pay | Admitting: Cardiology

## 2011-12-21 ENCOUNTER — Encounter (INDEPENDENT_AMBULATORY_CARE_PROVIDER_SITE_OTHER): Payer: Medicare Other | Admitting: Ophthalmology

## 2011-12-21 DIAGNOSIS — H43819 Vitreous degeneration, unspecified eye: Secondary | ICD-10-CM

## 2011-12-21 DIAGNOSIS — H3530 Unspecified macular degeneration: Secondary | ICD-10-CM

## 2011-12-21 DIAGNOSIS — H353290 Exudative age-related macular degeneration, unspecified eye, stage unspecified: Secondary | ICD-10-CM

## 2011-12-21 MED ORDER — ATENOLOL 25 MG PO TABS: 25.0000 mg | ORAL_TABLET | Freq: Every day | ORAL | Status: DC

## 2011-12-23 ENCOUNTER — Other Ambulatory Visit: Payer: Self-pay

## 2011-12-23 MED ORDER — ATENOLOL 25 MG PO TABS: ORAL_TABLET | ORAL | Status: DC

## 2011-12-27 ENCOUNTER — Telehealth: Payer: Self-pay | Admitting: Cardiology

## 2011-12-27 NOTE — Telephone Encounter (Signed)
New Problem:    Called in needing clarification for the directions of the patient's atenolol (TENORMIN) 25 MG tablet.

## 2011-12-28 ENCOUNTER — Other Ambulatory Visit: Payer: Self-pay

## 2011-12-28 MED ORDER — ATENOLOL 25 MG PO TABS: ORAL_TABLET | ORAL | Status: DC

## 2011-12-29 ENCOUNTER — Other Ambulatory Visit: Payer: Self-pay | Admitting: *Deleted

## 2012-01-04 NOTE — Telephone Encounter (Signed)
Spoke with pharmacy and verified med dosage

## 2012-01-06 ENCOUNTER — Ambulatory Visit (INDEPENDENT_AMBULATORY_CARE_PROVIDER_SITE_OTHER): Payer: Medicare Other

## 2012-01-06 DIAGNOSIS — I4891 Unspecified atrial fibrillation: Secondary | ICD-10-CM

## 2012-02-01 ENCOUNTER — Encounter (INDEPENDENT_AMBULATORY_CARE_PROVIDER_SITE_OTHER): Payer: Medicare Other | Admitting: Ophthalmology

## 2012-02-01 DIAGNOSIS — H43819 Vitreous degeneration, unspecified eye: Secondary | ICD-10-CM

## 2012-02-01 DIAGNOSIS — H353290 Exudative age-related macular degeneration, unspecified eye, stage unspecified: Secondary | ICD-10-CM

## 2012-02-01 DIAGNOSIS — H3530 Unspecified macular degeneration: Secondary | ICD-10-CM

## 2012-02-03 ENCOUNTER — Ambulatory Visit (INDEPENDENT_AMBULATORY_CARE_PROVIDER_SITE_OTHER): Payer: Medicare Other

## 2012-02-03 DIAGNOSIS — I4891 Unspecified atrial fibrillation: Secondary | ICD-10-CM

## 2012-02-09 ENCOUNTER — Ambulatory Visit (INDEPENDENT_AMBULATORY_CARE_PROVIDER_SITE_OTHER): Payer: Medicare Other | Admitting: Ophthalmology

## 2012-02-09 DIAGNOSIS — H26499 Other secondary cataract, unspecified eye: Secondary | ICD-10-CM

## 2012-02-25 DIAGNOSIS — B0229 Other postherpetic nervous system involvement: Secondary | ICD-10-CM | POA: Insufficient documentation

## 2012-02-25 HISTORY — DX: Other postherpetic nervous system involvement: B02.29

## 2012-03-02 ENCOUNTER — Ambulatory Visit (INDEPENDENT_AMBULATORY_CARE_PROVIDER_SITE_OTHER): Payer: Medicare Other | Admitting: *Deleted

## 2012-03-02 DIAGNOSIS — I4891 Unspecified atrial fibrillation: Secondary | ICD-10-CM

## 2012-03-05 ENCOUNTER — Telehealth: Payer: Self-pay | Admitting: *Deleted

## 2012-03-05 DIAGNOSIS — B029 Zoster without complications: Secondary | ICD-10-CM

## 2012-03-05 MED ORDER — FAMCICLOVIR 500 MG PO TABS: 500.0000 mg | ORAL_TABLET | Freq: Three times a day (TID) | ORAL | Status: DC

## 2012-03-05 NOTE — Telephone Encounter (Signed)
Yes this looks like shingles.  I would start her on generic Famvir 500 mg 3 times a day for 7 days.  For pain she could use Tylenol or if she needs something stronger we could call her in some hydrocodone 5/325 one every 6 hours when necessary

## 2012-03-05 NOTE — Telephone Encounter (Signed)
Patient walked in complaining of pain left sided hip pain and a rash.  Pain started on Saturday night

## 2012-03-05 NOTE — Telephone Encounter (Signed)
Patient presents with a red, burning blistery like rash. Looks like shingles. Will forward to Dr. Patty Sermons.

## 2012-03-05 NOTE — Telephone Encounter (Signed)
Left message to call back  

## 2012-03-06 ENCOUNTER — Telehealth: Payer: Self-pay | Admitting: Cardiology

## 2012-03-06 NOTE — Telephone Encounter (Signed)
New Problem  Pt called in returning phone call from yesterday.      Advised patient. States she just wants to use Tylenol for now. Will call back if she needs something stronger

## 2012-03-06 NOTE — Telephone Encounter (Signed)
New Problem    Pt called in returning phone call from yesterday.

## 2012-03-06 NOTE — Telephone Encounter (Signed)
Documented on open telephone encounter from yesterday

## 2012-03-07 ENCOUNTER — Other Ambulatory Visit: Payer: Self-pay | Admitting: *Deleted

## 2012-03-07 ENCOUNTER — Encounter (INDEPENDENT_AMBULATORY_CARE_PROVIDER_SITE_OTHER): Payer: Medicare Other | Admitting: Ophthalmology

## 2012-03-07 DIAGNOSIS — H2700 Aphakia, unspecified eye: Secondary | ICD-10-CM

## 2012-03-19 ENCOUNTER — Telehealth: Payer: Self-pay | Admitting: Cardiology

## 2012-03-19 NOTE — Telephone Encounter (Signed)
There is no indication that a repeat dose of Famvir would be of any help.  Her pain now is the residual from the initial shingles.  If she cannot take Vicodin then she'll need to continue to take Tylenol.

## 2012-03-19 NOTE — Telephone Encounter (Signed)
Advised patient

## 2012-03-19 NOTE — Telephone Encounter (Signed)
Patient called wanting to get a refill on the famciclovir because she is still having a lot of discomfort and scabbing. Patient states Tylenol does not help for her and when I mentioned hydrocodone said she could not take narcotics.  Will forward to  Dr. Patty Sermons for review

## 2012-03-19 NOTE — Telephone Encounter (Signed)
New Problem    Following up on refill for fimciclovir.

## 2012-03-19 NOTE — Telephone Encounter (Signed)
Left message to call back  

## 2012-03-23 ENCOUNTER — Encounter (HOSPITAL_COMMUNITY): Payer: Self-pay | Admitting: Emergency Medicine

## 2012-03-23 ENCOUNTER — Ambulatory Visit (INDEPENDENT_AMBULATORY_CARE_PROVIDER_SITE_OTHER): Payer: Medicare Other | Admitting: *Deleted

## 2012-03-23 ENCOUNTER — Emergency Department (HOSPITAL_COMMUNITY)
Admission: EM | Admit: 2012-03-23 | Discharge: 2012-03-23 | Disposition: A | Discharge status: Home / Self Care | Payer: Medicare Other | Source: Home / Self Care

## 2012-03-23 DIAGNOSIS — B029 Zoster without complications: Secondary | ICD-10-CM

## 2012-03-23 DIAGNOSIS — I4891 Unspecified atrial fibrillation: Secondary | ICD-10-CM

## 2012-03-23 LAB — POCT INR: INR: 2.5

## 2012-03-23 MED ORDER — GABAPENTIN 300 MG PO CAPS: ORAL_CAPSULE | ORAL | Status: DC

## 2012-03-23 MED ORDER — LIDOCAINE 5 % EX PTCH: 1.0000 | MEDICATED_PATCH | CUTANEOUS | Status: DC

## 2012-03-23 NOTE — ED Provider Notes (Signed)
History     CSN: [id number]  Arrival date & time 03/23/12  1029   First MD Initiated Contact with Patient 03/23/12 1030      Chief Complaint  Patient presents with  . Herpes Zoster    (Consider location/radiation/quality/duration/timing/severity/associated sxs/prior treatment) HPI Comments: 79 year old female developed herpes zoster approximately 4 weeks ago. She saw her doctor and was treated with Famvir. She continues to have a relatively large area of lesions on the left low back buttock anterior thigh and groin. She called her doctor this morning and she was told that he cannot do anything else for her. She denies fever, chills, GI symptoms or other rashes   Past Medical History  Diagnosis Date  . AF (atrial fibrillation)   . IHD (ischemic heart disease)   . Dyslipidemia   . Coronary artery disease     Past Surgical History  Procedure Laterality Date  . Coronary artery bypass graft  2002    No family history on file.  History  Substance Use Topics  . Smoking status: Never Smoker   . Smokeless tobacco: Not on file  . Alcohol Use: No    OB History   Grav Para Term Preterm Abortions TAB SAB Ect Mult Living                  Review of Systems  Constitutional: Positive for activity change. Negative for fever.  HENT: Negative.   Respiratory: Negative.   Cardiovascular: Negative.   Gastrointestinal: Negative.   Genitourinary: Negative.   Skin: Positive for rash.    Allergies  Crestor; Lipitor; Niaspan; Pravastatin; Ramipril; Welchol; and Zetia  Home Medications   Current Outpatient Rx  Name  Route  Sig  Dispense  Refill  . famciclovir (FAMVIR) 500 MG tablet   Oral   Take 1 tablet (500 mg total) by mouth 3 (three) times daily.   21 tablet   0   . aspirin 81 MG tablet   Oral   Take 81 mg by mouth daily.           Marland Kitchen atenolol (TENORMIN) 25 MG tablet      1/4 DAILY   30 tablet   6     As written to be taken.   . Cholecalciferol (VITAMIN D-3  PO)   Oral   Take by mouth. Taking 10,000 daily          . COD LIVER OIL PO   Oral   Take by mouth at bedtime.           . Coenzyme Q10 (COQ10 PO)   Oral   Take by mouth daily.           . fish oil-omega-3 fatty acids 1000 MG capsule   Oral   Take by mouth 2 (two) times daily.           Marland Kitchen lidocaine (LIDODERM) 5 %   Transdermal   Place 1 patch onto the skin daily. Remove & Discard patch within 12 hours or as directed by MD   30 patch   0   . Multiple Vitamin (MULTIVITAMIN) tablet   Oral   Take 1 tablet by mouth daily.           . Multiple Vitamins-Minerals (EYE VITAMINS) TABS   Oral   Take by mouth. Take 4 daily          . nitroGLYCERIN (NITROSTAT) 0.4 MG SL tablet   Sublingual   Place 1 tablet (0.4 mg  total) under the tongue every 5 (five) minutes as needed.   25 tablet   prn   . Red Yeast Rice Extract (RED YEAST RICE PO)   Oral   Take by mouth 2 (two) times daily.           Marland Kitchen RESVERATROL PO   Oral   Take by mouth. 4tbl daily          . warfarin (COUMADIN) 5 MG tablet      Take as directed by anticoagulation clinic   90 tablet   1     BP 125/86  Pulse 74  Temp(Src) 98 F (36.7 C) (Oral)  Resp 16  SpO2 99%  Physical Exam  Nursing note and vitals reviewed. Constitutional: She is oriented to person, place, and time. She appears well-developed and well-nourished.  Eyes: Conjunctivae and EOM are normal.  Neck: Normal range of motion. Neck supple.  Cardiovascular: Normal rate and regular rhythm.   Pulmonary/Chest: Effort normal.  Musculoskeletal: Normal range of motion. She exhibits no edema.  Neurological: She is alert and oriented to person, place, and time.  Skin: Lesion and rash noted. Rash is maculopapular. There is erythema.     Typical tender lesions for zoster.  Psychiatric: She has a normal mood and affect. Her behavior is normal.    ED Course  Procedures (including critical care time)  Labs Reviewed - No data to  display No results found.   1. Herpes zoster       MDM  Unfortunately this patient states she is unable to take any narcotics for pain. She has side effects which are intolerable. In addition, she has moderate to severe coronary artery disease and taking NSAIDs were also be problematic. An antiviral agent would be of no benefit at this time.        Hayden Rasmussen, NP 03/23/12 [phone]

## 2012-03-23 NOTE — ED Notes (Signed)
Pt is here for shingles x4 weeks States she saw her PCP and was dx w/shingles and was given meds  Meds not alleviating Sx include: painful Denies: f/v/n/d  She is alert w/no signs of acute

## 2012-03-23 NOTE — ED Provider Notes (Signed)
Medical screening examination/treatment/procedure(s) were performed by resident physician or non-physician practitioner and as supervising physician I was immediately available for consultation/collaboration.   Barkley Bruns MD.   Linna Hoff, MD 03/23/12 1501

## 2012-03-28 ENCOUNTER — Encounter (INDEPENDENT_AMBULATORY_CARE_PROVIDER_SITE_OTHER): Payer: Medicare Other | Admitting: Ophthalmology

## 2012-03-28 DIAGNOSIS — H33309 Unspecified retinal break, unspecified eye: Secondary | ICD-10-CM

## 2012-03-28 DIAGNOSIS — H353290 Exudative age-related macular degeneration, unspecified eye, stage unspecified: Secondary | ICD-10-CM

## 2012-03-28 DIAGNOSIS — H43819 Vitreous degeneration, unspecified eye: Secondary | ICD-10-CM

## 2012-03-28 DIAGNOSIS — H3530 Unspecified macular degeneration: Secondary | ICD-10-CM

## 2012-04-12 ENCOUNTER — Telehealth: Payer: Self-pay | Admitting: Family Medicine

## 2012-04-12 NOTE — Telephone Encounter (Signed)
Pt is trying to est herself and her husband w/you as a PCP and both have Blue MCR.  She says she is battling Shingles and is in pain and wants to know if you could get her in sooner (your New Pt's are scheduled out into May).  Can you accommodate her? Thank you.

## 2012-04-13 NOTE — Telephone Encounter (Signed)
Left mssg requesting pt to call back to the office

## 2012-04-13 NOTE — Telephone Encounter (Signed)
We can see her on Monday for an acute visit. Or recommend she go to [facility] today or over weekend to be evaluated for shingles.

## 2012-04-16 ENCOUNTER — Telehealth: Payer: Self-pay | Admitting: Family Medicine

## 2012-04-16 ENCOUNTER — Ambulatory Visit (INDEPENDENT_AMBULATORY_CARE_PROVIDER_SITE_OTHER): Payer: Medicare Other | Admitting: Family Medicine

## 2012-04-16 ENCOUNTER — Encounter: Payer: Self-pay | Admitting: Family Medicine

## 2012-04-16 VITALS — BP 144/88 | HR 88 | Temp 97.7°F | Wt 157.0 lb

## 2012-04-16 DIAGNOSIS — B0229 Other postherpetic nervous system involvement: Secondary | ICD-10-CM

## 2012-04-16 MED ORDER — CAPSAICIN 0.075 % EX CREA: TOPICAL_CREAM | Freq: Three times a day (TID) | CUTANEOUS | Status: DC | PRN

## 2012-04-16 NOTE — Assessment & Plan Note (Signed)
Persistent post herpetic neuralgia. Discussed dx as well as treatment options. No narcotics, tramadol, NSAIDs. Likely started on too high dose of gabapentin - start at 100mg  daily for 4 days then may increase to 100mg  bid.   Pt states has left over 300mg  and will split those. rec continue tylenol, also trial of capsacin cream. Pt agrees with plan. rtc to fully establish.

## 2012-04-16 NOTE — Telephone Encounter (Signed)
Noted.  New pt - will do acute visit today but pt will need to set up formal visit to establish care in future.

## 2012-04-16 NOTE — Telephone Encounter (Signed)
Pt was calling to see about getting RX to help with shingles.  Pt states she has been dealing with the shingles since 02/27/12 and was seen in the ER on 03/23/12.  Pt called last week (04/12/12) and Dr Reece Agar recommended pt have an office visit today (04/16/12).  RN scheduled pt to be seen at 1430 today (3/21) with Dr Reece Agar

## 2012-04-16 NOTE — Progress Notes (Signed)
Subjective:    Patient ID: Ellen Barajas, female    DOB: February 01, 1928, 79 y.o.   MRN: [id number]  HPI CC: new pt to establish for acute issue.  All records available reviewed.  Seen early February by cardiologist with shingles L hip along L1-2 distribution - prescribed famcyclovir x 1 week.  Did not improve.   Seen in follow up at coumadin clinic - given amt of pain she was in, was referred to urgent care - offered narcotic meds but pt declined.  Treated with gabapentin 300mg  which made her sleep for the whole next day and lidocaine patches which did not help.   Currently using apinol oil - to soothe skin. No new open spots or new lesions.  Does not want any narcotics - bad reaction in past. On coumadin as well.  Long term patient of Dr. Patty Sermons - followed for atrial fibrillation and ischemic heart disease s/p CABG (2002).  Stable from cards standpoint.  Medications and allergies reviewed and updated in chart.  Past histories reviewed and updated if relevant as below. Patient Active Problem List  Diagnosis  . Atrial fibrillation  . AF (atrial fibrillation)  . IHD (ischemic heart disease)  . Dyslipidemia  . Coronary artery disease   Past Medical History  Diagnosis Date  . AF (atrial fibrillation)   . IHD (ischemic heart disease)   . Dyslipidemia   . Coronary artery disease    Past Surgical History  Procedure Laterality Date  . Coronary artery bypass graft  2002  . Cataract extraction  2013    bilateral   History  Substance Use Topics  . Smoking status: Never Smoker   . Smokeless tobacco: Never Used  . Alcohol Use: No   Family History  Problem Relation Age of Onset  . CAD Father 63    MI  . CAD Mother 2    MI   Allergies  Allergen Reactions  . Crestor (Rosuvastatin Calcium)   . Lipitor (Atorvastatin Calcium)   . Niaspan (Niacin Er)   . Pravastatin   . Ramipril (Ramipril)     Hyperkalemia  . Welchol (Colesevelam Hcl)   . Zetia (Ezetimibe)    Current  Outpatient Prescriptions on File Prior to Visit  Medication Sig Dispense Refill  . aspirin 81 MG tablet Take 81 mg by mouth daily.        Marland Kitchen atenolol (TENORMIN) 25 MG tablet 1/4 DAILY  30 tablet  6  . Cholecalciferol (VITAMIN D-3 PO) Take by mouth. Taking 10,000 daily       . COD LIVER OIL PO Take by mouth at bedtime.        . Coenzyme Q10 (COQ10 PO) Take by mouth daily.        . fish oil-omega-3 fatty acids 1000 MG capsule Take by mouth 2 (two) times daily.        . Multiple Vitamin (MULTIVITAMIN) tablet Take 1 tablet by mouth daily.        . Multiple Vitamins-Minerals (EYE VITAMINS) TABS Take by mouth. Take 4 daily       . nitroGLYCERIN (NITROSTAT) 0.4 MG SL tablet Place 1 tablet (0.4 mg total) under the tongue every 5 (five) minutes as needed.  25 tablet  prn  . Red Yeast Rice Extract (RED YEAST RICE PO) Take by mouth 2 (two) times daily.        Marland Kitchen RESVERATROL PO Take by mouth. 4tbl daily for cholesterol      . warfarin (COUMADIN) 5 MG  tablet Take as directed by anticoagulation clinic  90 tablet  1   No current facility-administered medications on file prior to visit.    Review of Systems Per HPI    Objective:   Physical Exam  Nursing note and vitals reviewed. Constitutional: She appears well-developed and well-nourished. No distress.  HENT:  Head: Normocephalic and atraumatic.  Mouth/Throat: Oropharynx is clear and moist. No oropharyngeal exudate.  Eyes: Conjunctivae and EOM are normal. Pupils are equal, round, and reactive to light. No scleral icterus.  Neck: Normal range of motion. Neck supple.  Cardiovascular: Normal rate, regular rhythm, normal heart sounds and intact distal pulses.   No murmur heard. Pulmonary/Chest: Effort normal and breath sounds normal. No respiratory distress. She has no wheezes. She has no rales.  Skin: Skin is warm and dry. Rash noted.     Dry post herpetic rash with some scarring but mostly hyperpigmented macules.  Very tender to touch         Assessment & Plan:

## 2012-04-16 NOTE — Patient Instructions (Addendum)
Try capsacin cream for rash as instructed on box. Try low dose gabapentin to help control nerve pain.  (You have 300mg  capsules at home). I would start at 100mg  nightly for 3-4 days then try increasing to 100mg  twice daily - and call me with an update. Return in next few months for 30 min appt to establish.  Postherpetic Neuralgia Shingles is a painful disease. It is caused by the herpes zoster virus. This is the same virus which also causes chickenpox. It can affect the torso, limbs, or the face. For most people, shingles is a condition of rather sudden onset. Pain usually lasts about 1 month. In older patients, or patients with poor immune systems, a painful, long-standing (chronic) condition called postherpetic neuralgia can develop. This condition rarely happens before age 20. But at least 50% of people over 50 become affected following an attack of shingles. There is a natural tendency for this condition to improve over time with no treatment. Less than 5% of patients have pain that lasts for more than 1 year. DIAGNOSIS  Herpes is usually easily diagnosed on physical exam. Pain sometimes follows when the skin sores (lesions) have disappeared. It is called postherpetic neuralgia. That name simply means the pain that follows herpes. TREATMENT   Treating this condition may be difficult. Usually one of the tricyclic antidepressants, often amitriptyline, is the first line of treatment. There is evidence that the sooner these medications are given, the more likely they are to reduce pain.  Conventional analgesics, regional nerve blocks, and anticonvulsants have little benefit in most cases when used alone. Other tricyclic anti-depressants are used as a second option if the first antidepressant is unsuccessful.  Anticonvulsants, including carbamazepine, have been found to provide some added benefit when used with a tricyclic anti-depressant. This is especially for the stabbing type of pain similar to  that of trigeminal neuralgia.  Chronic opioid therapy. This is a strong narcotic pain medication. It is used to treat pain that is resistant to other measures. The issues of dependency and tolerance can be reduced with closely managed care.  Some cream treatments are applied locally to the affected area. They can help when used with other treatments. Their use may be difficult in the case of postherpetic trigeminal neuralgia. This is involved with the face. So the substances can irritate the eye and the skin around the eye. Examples of creams used include Capsaicin and lidocaine creams.  For shingles, antiviral therapies along with analgesics are recommended. Studies of the effect of anti-viral agents such as acyclovir on shingles have been done. They show improved rates of healing and decreased severity of sudden (acute) pain. Some observations suggest that nerve blocks during shingles infection will:  Reduce pain.  Shorten the acute episode.  Prevent the emergence of postherpetic neuralgia. Viral medications used include Acyclovir (Zovirax), Valacyclovir, Famciclovir and a lysine diet. Document Released: 04/02/2002 Document Revised: 04/04/2011 Document Reviewed: 01/10/2005 [facility] Patient Information 2013 [place], [place].

## 2012-04-20 ENCOUNTER — Ambulatory Visit (INDEPENDENT_AMBULATORY_CARE_PROVIDER_SITE_OTHER): Payer: Medicare Other | Admitting: *Deleted

## 2012-04-20 ENCOUNTER — Other Ambulatory Visit (INDEPENDENT_AMBULATORY_CARE_PROVIDER_SITE_OTHER): Payer: Medicare Other

## 2012-04-20 ENCOUNTER — Encounter: Payer: Self-pay | Admitting: Cardiology

## 2012-04-20 ENCOUNTER — Ambulatory Visit (INDEPENDENT_AMBULATORY_CARE_PROVIDER_SITE_OTHER): Payer: Medicare Other | Admitting: Cardiology

## 2012-04-20 VITALS — BP 122/64 | HR 72 | Ht 63.0 in | Wt 155.6 lb

## 2012-04-20 DIAGNOSIS — E7800 Pure hypercholesterolemia, unspecified: Secondary | ICD-10-CM

## 2012-04-20 DIAGNOSIS — E785 Hyperlipidemia, unspecified: Secondary | ICD-10-CM

## 2012-04-20 DIAGNOSIS — I2510 Atherosclerotic heart disease of native coronary artery without angina pectoris: Secondary | ICD-10-CM

## 2012-04-20 DIAGNOSIS — I4891 Unspecified atrial fibrillation: Secondary | ICD-10-CM

## 2012-04-20 DIAGNOSIS — R0989 Other specified symptoms and signs involving the circulatory and respiratory systems: Secondary | ICD-10-CM

## 2012-04-20 DIAGNOSIS — B0229 Other postherpetic nervous system involvement: Secondary | ICD-10-CM

## 2012-04-20 LAB — BASIC METABOLIC PANEL
BUN: 15 mg/dL (ref 6–23)
CO2: 29 mEq/L (ref 19–32)
Chloride: 104 mEq/L (ref 96–112)
Creatinine, Ser: 0.9 mg/dL (ref 0.4–1.2)
Glucose, Bld: 95 mg/dL (ref 70–99)

## 2012-04-20 LAB — HEPATIC FUNCTION PANEL
Alkaline Phosphatase: 66 U/L (ref 39–117)
Bilirubin, Direct: 0.1 mg/dL (ref 0.0–0.3)
Total Bilirubin: 0.6 mg/dL (ref 0.3–1.2)
Total Protein: 6.7 g/dL (ref 6.0–8.3)

## 2012-04-20 LAB — POCT INR: INR: 2.2

## 2012-04-20 LAB — LIPID PANEL
Cholesterol: 195 mg/dL (ref 0–200)
LDL Cholesterol: 125 mg/dL — ABNORMAL HIGH (ref 0–99)

## 2012-04-20 NOTE — Assessment & Plan Note (Signed)
The patient is in chronic atrial fibrillation.  She is on long-term Coumadin.  She has not been experiencing any TIA symptoms.

## 2012-04-20 NOTE — Assessment & Plan Note (Signed)
The patient has a history of dyslipidemia.  We are checking lab work today.  She is not on a statin but is taking some herbal remedies as well as omega-3 fatty acid capsules

## 2012-04-20 NOTE — Progress Notes (Signed)
Ellen Barajas Date of Birth:  09/13/1928 [facility] [address] [place], [place]  [address] [phone]         Fax   [phone]  History of Present Illness: This pleasant 79 year old woman is seen for a scheduled four-month followup office visit. She has a history of ischemic heart disease. She has had previous CABG. [name] been atrial fibrillation. She underwent cardioversion on 10/23/09 but 1 week later was back in atrial fibrillation. We have made no further attempts at cardioversion. She is essentially asymptomatic with her rhythm. She has a history of hypercholesterolemia and is on over-the-counter preparations. We are checking lipids today. Since last visit she has had no new cardiac symptoms. She remains on long-term Coumadin.  Since the last saw her the patient had a case of severe shingles of the left buttock and inguinal area.  She is still having a lot of postherpetic neuralgia.   Current Outpatient Prescriptions  Medication Sig Dispense Refill  . aspirin 81 MG tablet Take 81 mg by mouth daily.        Marland Kitchen atenolol (TENORMIN) 25 MG tablet 1/4 DAILY  30 tablet  6  . capsicum (ZOSTRIX) 0.075 % topical cream Apply topically 3 (three) times daily as needed.  60 g  0  . Cholecalciferol (VITAMIN D-3 PO) Take by mouth. Taking 10,000 daily       . COD LIVER OIL PO Take by mouth at bedtime.        . Coenzyme Q10 (COQ10 PO) Take by mouth daily.        . fish oil-omega-3 fatty acids 1000 MG capsule Take by mouth 2 (two) times daily.        . Multiple Vitamin (MULTIVITAMIN) tablet Take 1 tablet by mouth daily.        . Multiple Vitamins-Minerals (EYE VITAMINS) TABS Take by mouth. Take 4 daily       . nitroGLYCERIN (NITROSTAT) 0.4 MG SL tablet Place 1 tablet (0.4 mg total) under the tongue every 5 (five) minutes as needed.  25 tablet  prn  . Red Yeast Rice Extract (RED YEAST RICE PO) Take by mouth 2 (two) times daily.        Marland Kitchen RESVERATROL PO Take by mouth. 4tbl daily for  cholesterol      . warfarin (COUMADIN) 5 MG tablet Take as directed by anticoagulation clinic  90 tablet  1   No current facility-administered medications for this visit.    Allergies  Allergen Reactions  . Crestor (Rosuvastatin Calcium)   . Lipitor (Atorvastatin Calcium)   . Niaspan (Niacin Er)   . Other     PER PATIENT SHE DOES NOT TOLERATE NARCOTICS  . Pravastatin   . Ramipril (Ramipril)     Hyperkalemia  . Welchol (Colesevelam Hcl)   . Zetia (Ezetimibe)     Patient Active Problem List  Diagnosis  . Atrial fibrillation  . IHD (ischemic heart disease)  . Dyslipidemia  . Coronary artery disease  . Post herpetic neuralgia    History  Smoking status  . Never Smoker   Smokeless tobacco  . Never Used    History  Alcohol Use No    Family History  Problem Relation Age of Onset  . CAD Father 59    MI  . CAD Mother 73    MI    Review of Systems: Constitutional: no fever chills diaphoresis or fatigue or change in weight.  Head and neck: no hearing loss, no  epistaxis, no photophobia or visual disturbance. Respiratory: No cough, shortness of breath or wheezing. Cardiovascular: No chest pain peripheral edema, palpitations. Gastrointestinal: No abdominal distention, no abdominal pain, no change in bowel habits hematochezia or melena. Genitourinary: No dysuria, no frequency, no urgency, no nocturia. Musculoskeletal:No arthralgias, no back pain, no gait disturbance or myalgias. Neurological: No dizziness, no headaches, no numbness, no seizures, no syncope, no weakness, no tremors. Hematologic: No lymphadenopathy, no easy bruising. Psychiatric: No confusion, no hallucinations, no sleep disturbance.    Physical Exam: Filed Vitals:   04/20/12 0910  BP: 122/64  Pulse: 72   the general appearance reveals a well-developed well-nourished woman in no distress.The head and neck exam reveals pupils equal and reactive.  Extraocular movements are full.  There is no scleral  icterus.  The mouth and pharynx are normal.  The neck is supple.  The carotids reveal no bruits.  The jugular venous pressure is normal.  The  thyroid is not enlarged.  There is no lymphadenopathy.  The chest is clear to percussion and auscultation.  There are no rales or rhonchi.  Expansion of the chest is symmetrical.  Breasts reveal no masses. The precordium is quiet.  The first heart sound is normal.  The second heart sound is physiologically split.  There is no murmur gallop rub or click.  There is no abnormal lift or heave.  The abdomen is soft and nontender.  The bowel sounds are normal.  The liver and spleen are not enlarged.  There are no abdominal masses.  There are no abdominal bruits.  Extremities reveal good pedal pulses.  There is no phlebitis or edema.  There is no cyanosis or clubbing.  Strength is normal and symmetrical in all extremities.  There is no lateralizing weakness.  There are no sensory deficits.  The skin is warm and dry.  There is significant residual rash from the previous shingles overlying the left buttock and flank and inguinal area.     Assessment / Plan: Continue same medication.  Lab work today pending.  Recheck in 4 months for followup office visit.  Continue careful diet and she has lost 2 pounds since last visit

## 2012-04-20 NOTE — Patient Instructions (Signed)
Will obtain labs today and call you with the results (LP/BMET/HFP)  Your physician recommends that you continue on your current medications as directed. Please refer to the Current Medication list given to you today.  Your physician wants you to follow-up in: right  You will receive a reminder letter in the mail two months in advance. If you don't receive a letter, please call our office to schedule the follow-up appointment.

## 2012-04-20 NOTE — Assessment & Plan Note (Signed)
The patient has a history of coronary disease with previous CABG.  She has not had any recurrent angina.

## 2012-04-21 NOTE — Progress Notes (Signed)
Quick Note:  Please report to patient. The recent labs are stable. Continue same medication and careful diet. The total cholesterol has improved since last visit. Continue heart healthy diet ______

## 2012-04-24 ENCOUNTER — Telehealth: Payer: Self-pay | Admitting: *Deleted

## 2012-04-24 NOTE — Telephone Encounter (Signed)
Advised patient of lab results  

## 2012-04-24 NOTE — Telephone Encounter (Signed)
Message copied by Burnell Blanks on Tue Apr 24, 2012  3:13 PM ------      Message from: Cassell Clement      Created: Sat Apr 21, 2012  4:48 PM       Please report to patient.  The recent labs are stable. Continue same medication and careful diet.  The total cholesterol has improved since last visit.  Continue heart healthy diet ------

## 2012-05-18 ENCOUNTER — Ambulatory Visit (INDEPENDENT_AMBULATORY_CARE_PROVIDER_SITE_OTHER): Payer: Medicare Other

## 2012-05-18 DIAGNOSIS — I4891 Unspecified atrial fibrillation: Secondary | ICD-10-CM

## 2012-05-18 LAB — POCT INR: INR: 2.2

## 2012-06-06 ENCOUNTER — Encounter (INDEPENDENT_AMBULATORY_CARE_PROVIDER_SITE_OTHER): Payer: Medicare Other | Admitting: Ophthalmology

## 2012-06-06 ENCOUNTER — Telehealth: Payer: Self-pay | Admitting: Cardiology

## 2012-06-06 DIAGNOSIS — H3530 Unspecified macular degeneration: Secondary | ICD-10-CM

## 2012-06-06 DIAGNOSIS — H353290 Exudative age-related macular degeneration, unspecified eye, stage unspecified: Secondary | ICD-10-CM

## 2012-06-06 DIAGNOSIS — H43819 Vitreous degeneration, unspecified eye: Secondary | ICD-10-CM

## 2012-06-06 NOTE — Telephone Encounter (Signed)
New Prob     Pt called in returning phone call from yesterday. States she will call back later, she will be out for majority of the morning.

## 2012-06-06 NOTE — Telephone Encounter (Signed)
Discussed husbands lasix, spoke with pharmacy on 06/04/12 and cancelled on Rx so she would not receive wrong amount again

## 2012-06-06 NOTE — Telephone Encounter (Signed)
Follow-up:    Patient called in returning your call.  Please call back.  Patient was aware that you were out of the office when this message was taken.

## 2012-06-15 ENCOUNTER — Ambulatory Visit (INDEPENDENT_AMBULATORY_CARE_PROVIDER_SITE_OTHER): Payer: Medicare Other | Admitting: *Deleted

## 2012-06-15 DIAGNOSIS — I4891 Unspecified atrial fibrillation: Secondary | ICD-10-CM

## 2012-06-27 ENCOUNTER — Encounter: Payer: Self-pay | Admitting: Family Medicine

## 2012-06-27 ENCOUNTER — Ambulatory Visit (INDEPENDENT_AMBULATORY_CARE_PROVIDER_SITE_OTHER): Payer: Medicare Other | Admitting: Family Medicine

## 2012-06-27 VITALS — BP 130/78 | HR 84 | Temp 98.0°F | Ht 63.0 in | Wt 159.5 lb

## 2012-06-27 DIAGNOSIS — B0229 Other postherpetic nervous system involvement: Secondary | ICD-10-CM

## 2012-06-27 DIAGNOSIS — I4891 Unspecified atrial fibrillation: Secondary | ICD-10-CM

## 2012-06-27 DIAGNOSIS — E785 Hyperlipidemia, unspecified: Secondary | ICD-10-CM

## 2012-06-27 NOTE — Assessment & Plan Note (Signed)
Resolving well.  Discussed zostavax - she has script.

## 2012-06-27 NOTE — Patient Instructions (Addendum)
Good to see you today, return as needed. No changes today. Return in 6 months for wellness exam.

## 2012-06-27 NOTE — Assessment & Plan Note (Signed)
LDL above goal but pt does not tolerate many different cholesterol agents (statin, welchol, nystatin)

## 2012-06-27 NOTE — Progress Notes (Signed)
Subjective:    Patient ID: Ellen Barajas, female    DOB: 1928-02-02, 79 y.o.   MRN: [id number]  HPI CC: presents to fully establish  PHN - resolving well. H/o IHD s/p CABG.  To see Dr. Patty Sermons in July. H/o afib - on coumadin.  Sees coumadin clinic every month.  On atenolol 1/4 pill daily.  Well controlled.  Medications and allergies reviewed and updated in chart.  Past histories reviewed and updated if relevant as below. Patient Active Problem List   Diagnosis Date Noted  . Post herpetic neuralgia 02/25/2012  . IHD (ischemic heart disease)   . Dyslipidemia   . Coronary artery disease   . Persistent atrial fibrillation 04/19/2010   Past Medical History  Diagnosis Date  . AF (atrial fibrillation)     coumadin  . IHD (ischemic heart disease)   . Dyslipidemia   . Coronary artery disease 2002    s/p CABG  . Post herpetic neuralgia 02/2012  . Arthritis   . Diverticulosis   . History of chicken pox    Past Surgical History  Procedure Laterality Date  . Coronary artery bypass graft  2002    x 4  . Cataract extraction  2013    bilateral  . Breast lumpectomy      left   History  Substance Use Topics  . Smoking status: Never Smoker   . Smokeless tobacco: Never Used  . Alcohol Use: No   Family History  Problem Relation Age of Onset  . CAD Father 79    MI  . CAD Mother 61    MI   Allergies  Allergen Reactions  . Crestor (Rosuvastatin Calcium)   . Lipitor (Atorvastatin Calcium)   . Niaspan (Niacin Er)   . Other     PER PATIENT SHE DOES NOT TOLERATE NARCOTICS  . Pravastatin   . Ramipril (Ramipril)     Hyperkalemia  . Welchol (Colesevelam Hcl)   . Zetia (Ezetimibe)    Current Outpatient Prescriptions on File Prior to Visit  Medication Sig Dispense Refill  . aspirin 81 MG tablet Take 81 mg by mouth daily.        Marland Kitchen atenolol (TENORMIN) 25 MG tablet 1/4 DAILY  30 tablet  6  . Cholecalciferol (VITAMIN D-3 PO) Take by mouth. Taking 10,000 daily       . COD LIVER OIL  PO Take by mouth at bedtime.        . Coenzyme Q10 (COQ10 PO) Take by mouth daily.        . fish oil-omega-3 fatty acids 1000 MG capsule Take by mouth 2 (two) times daily.        . Multiple Vitamin (MULTIVITAMIN) tablet Take 1 tablet by mouth daily.        . Multiple Vitamins-Minerals (EYE VITAMINS) TABS Take by mouth. Take 4 daily       . nitroGLYCERIN (NITROSTAT) 0.4 MG SL tablet Place 1 tablet (0.4 mg total) under the tongue every 5 (five) minutes as needed.  25 tablet  prn  . Red Yeast Rice Extract (RED YEAST RICE PO) Take by mouth 2 (two) times daily.        Marland Kitchen warfarin (COUMADIN) 5 MG tablet Take as directed by anticoagulation clinic  90 tablet  1   No current facility-administered medications on file prior to visit.    Review of Systems Per HPI    Objective:   Physical Exam  Nursing note and vitals reviewed. Constitutional: She appears  well-developed and well-nourished. No distress.  HENT:  Head: Normocephalic and atraumatic.  Mouth/Throat: Oropharynx is clear and moist. No oropharyngeal exudate.  Eyes: Conjunctivae and EOM are normal. Pupils are equal, round, and reactive to light. No scleral icterus.  Cardiovascular: Normal rate, normal heart sounds and intact distal pulses.  An irregular rhythm present.  No murmur heard. Pulmonary/Chest: Effort normal and breath sounds normal. No respiratory distress. She has no wheezes. She has no rales.  Musculoskeletal: She exhibits edema (tr pedal edema).  Skin: Skin is warm and dry. No rash noted.  Psychiatric: She has a normal mood and affect.       Assessment & Plan:

## 2012-06-27 NOTE — Assessment & Plan Note (Addendum)
Persistent afib.  Rate controlled.  On coumadin and therapeutic.

## 2012-07-26 ENCOUNTER — Ambulatory Visit (INDEPENDENT_AMBULATORY_CARE_PROVIDER_SITE_OTHER): Payer: Medicare Other | Admitting: *Deleted

## 2012-07-26 DIAGNOSIS — I4819 Other persistent atrial fibrillation: Secondary | ICD-10-CM

## 2012-07-26 DIAGNOSIS — I4891 Unspecified atrial fibrillation: Secondary | ICD-10-CM

## 2012-07-30 ENCOUNTER — Other Ambulatory Visit: Payer: Self-pay | Admitting: *Deleted

## 2012-07-30 DIAGNOSIS — I4891 Unspecified atrial fibrillation: Secondary | ICD-10-CM

## 2012-07-30 MED ORDER — WARFARIN SODIUM 5 MG PO TABS: ORAL_TABLET | ORAL | Status: DC

## 2012-08-17 ENCOUNTER — Encounter: Payer: Self-pay | Admitting: Cardiology

## 2012-08-17 ENCOUNTER — Ambulatory Visit (INDEPENDENT_AMBULATORY_CARE_PROVIDER_SITE_OTHER): Payer: Medicare Other | Admitting: *Deleted

## 2012-08-17 ENCOUNTER — Ambulatory Visit (INDEPENDENT_AMBULATORY_CARE_PROVIDER_SITE_OTHER): Payer: Medicare Other | Admitting: Cardiology

## 2012-08-17 VITALS — BP 112/62 | HR 72 | Ht 63.0 in | Wt 160.8 lb

## 2012-08-17 DIAGNOSIS — I4891 Unspecified atrial fibrillation: Secondary | ICD-10-CM

## 2012-08-17 DIAGNOSIS — I4819 Other persistent atrial fibrillation: Secondary | ICD-10-CM

## 2012-08-17 DIAGNOSIS — I259 Chronic ischemic heart disease, unspecified: Secondary | ICD-10-CM

## 2012-08-17 DIAGNOSIS — E785 Hyperlipidemia, unspecified: Secondary | ICD-10-CM

## 2012-08-17 DIAGNOSIS — E7800 Pure hypercholesterolemia, unspecified: Secondary | ICD-10-CM

## 2012-08-17 LAB — POCT INR: INR: 2.1

## 2012-08-17 NOTE — Assessment & Plan Note (Signed)
The patient has had no recurrent chest pain or angina 

## 2012-08-17 NOTE — Patient Instructions (Signed)
Your physician recommends that you continue on your current medications as directed. Please refer to the Current Medication list given to you today.  Your physician wants you to follow-up in: 4 months with fasting labs (lp/bmet/hfp) You will receive a reminder letter in the mail two months in advance. If you don't receive a letter, please call our office to schedule the follow-up appointment.  

## 2012-08-17 NOTE — Progress Notes (Signed)
Ellen Barajas Date of Birth:  1928/02/04 [facility] [address] [place], [place]  [address] [phone]         Fax   [phone]  History of Present Illness: This pleasant 79 year old woman is seen for a scheduled four-month followup office visit. She has a history of ischemic heart disease. She has had previous CABG. [name] been atrial fibrillation. She underwent cardioversion on 10/23/09 but 1 week later was back in atrial fibrillation. We have made no further attempts at cardioversion. She is essentially asymptomatic with her rhythm. She has a history of hypercholesterolemia and is on over-the-counter preparations. We are checking lipids today. Since last visit she has had no new cardiac symptoms. She remains on long-term Coumadin. Since the last saw her the patient had a case of severe shingles of the left buttock and inguinal area. She is still having a lot of postherpetic neuralgia.   Current Outpatient Prescriptions  Medication Sig Dispense Refill  . aspirin 81 MG tablet Take 81 mg by mouth daily.        Marland Kitchen atenolol (TENORMIN) 25 MG tablet 1/4 DAILY  30 tablet  6  . Cholecalciferol (VITAMIN D-3 PO) Take by mouth. Taking 10,000 daily       . COD LIVER OIL PO Take by mouth at bedtime.        . Coenzyme Q10 (COQ10 PO) Take by mouth daily.        . fish oil-omega-3 fatty acids 1000 MG capsule Take by mouth 2 (two) times daily.        . Multiple Vitamin (MULTIVITAMIN) tablet Take 1 tablet by mouth daily.        . Multiple Vitamins-Minerals (EYE VITAMINS) TABS Take by mouth. Take 4 daily       . nitroGLYCERIN (NITROSTAT) 0.4 MG SL tablet Place 1 tablet (0.4 mg total) under the tongue every 5 (five) minutes as needed.  25 tablet  prn  . Red Yeast Rice Extract (RED YEAST RICE PO) Take by mouth 2 (two) times daily.        Marland Kitchen RESVERATROL PO Take 2 capsules by mouth daily.      Marland Kitchen warfarin (COUMADIN) 5 MG tablet Take as directed by anticoagulation clinic  90 tablet  1   No  current facility-administered medications for this visit.    Allergies  Allergen Reactions  . Crestor (Rosuvastatin Calcium)   . Lipitor (Atorvastatin Calcium)   . Niaspan (Niacin Er)   . Other     PER PATIENT SHE DOES NOT TOLERATE NARCOTICS  . Pravastatin   . Ramipril (Ramipril)     Hyperkalemia  . Welchol (Colesevelam Hcl)   . Zetia (Ezetimibe)     Patient Active Problem List   Diagnosis Date Noted  . Dyslipidemia     Priority: Medium  . Persistent atrial fibrillation 04/19/2010    Priority: Medium  . Post herpetic neuralgia 02/25/2012  . IHD (ischemic heart disease)   . Coronary artery disease     History  Smoking status  . Never Smoker   Smokeless tobacco  . Never Used    History  Alcohol Use No    Family History  Problem Relation Age of Onset  . CAD Father 34    MI  . CAD Mother 54    MI    Review of Systems: Constitutional: no fever chills diaphoresis or fatigue or change in weight.  Head and neck: no hearing loss, no epistaxis, no photophobia or visual  disturbance. Respiratory: No cough, shortness of breath or wheezing. Cardiovascular: No chest pain peripheral edema, palpitations. Gastrointestinal: No abdominal distention, no abdominal pain, no change in bowel habits hematochezia or melena. Genitourinary: No dysuria, no frequency, no urgency, no nocturia. Musculoskeletal:No arthralgias, no back pain, no gait disturbance or myalgias. Neurological: No dizziness, no headaches, no numbness, no seizures, no syncope, no weakness, no tremors. Hematologic: No lymphadenopathy, no easy bruising. Psychiatric: No confusion, no hallucinations, no sleep disturbance.    Physical Exam: Filed Vitals:   08/17/12 0840  BP: 112/62  Pulse: 72   the general appearance reveals a well-developed well-nourished woman in no distress.The head and neck exam reveals pupils equal and reactive.  Extraocular movements are full.  There is no scleral icterus.  The mouth and  pharynx are normal.  The neck is supple.  The carotids reveal no bruits.  The jugular venous pressure is normal.  The  thyroid is not enlarged.  There is no lymphadenopathy.  The chest is clear to percussion and auscultation.  There are no rales or rhonchi.  Expansion of the chest is symmetrical.  The precordium is quiet.  The first heart sound is normal.  The second heart sound is physiologically split.  There is no murmur gallop rub or click.  There is no abnormal lift or heave.  The abdomen is soft and nontender.  The bowel sounds are normal.  The liver and spleen are not enlarged.  There are no abdominal masses.  There are no abdominal bruits.  Extremities reveal good pedal pulses.  There is no phlebitis or edema.  There is no cyanosis or clubbing.  Strength is normal and symmetrical in all extremities.  There is no lateralizing weakness.  There are no sensory deficits.  The skin is warm and dry.  There is no rash.     Assessment / Plan: Continue same medication.  Recheck in 4 months for office visit lipid panel hepatic function panel and basal metabolic panel

## 2012-08-17 NOTE — Assessment & Plan Note (Signed)
Patient has gained 5 pounds since last visit.  Is not on any statins.  She does not tolerate them.

## 2012-08-17 NOTE — Assessment & Plan Note (Signed)
She is doing well on her Coumadin.  No TIA symptoms.

## 2012-09-12 ENCOUNTER — Encounter (INDEPENDENT_AMBULATORY_CARE_PROVIDER_SITE_OTHER): Payer: Medicare Other | Admitting: Ophthalmology

## 2012-09-12 DIAGNOSIS — H3530 Unspecified macular degeneration: Secondary | ICD-10-CM

## 2012-09-12 DIAGNOSIS — H43819 Vitreous degeneration, unspecified eye: Secondary | ICD-10-CM

## 2012-10-12 ENCOUNTER — Ambulatory Visit (INDEPENDENT_AMBULATORY_CARE_PROVIDER_SITE_OTHER): Payer: Medicare Other | Admitting: Pharmacist

## 2012-10-12 DIAGNOSIS — I4891 Unspecified atrial fibrillation: Secondary | ICD-10-CM

## 2012-10-12 DIAGNOSIS — I4819 Other persistent atrial fibrillation: Secondary | ICD-10-CM

## 2012-10-12 LAB — POCT INR: INR: 2.7

## 2012-11-07 ENCOUNTER — Encounter (INDEPENDENT_AMBULATORY_CARE_PROVIDER_SITE_OTHER): Payer: Medicare Other | Admitting: Ophthalmology

## 2012-11-07 DIAGNOSIS — H3530 Unspecified macular degeneration: Secondary | ICD-10-CM

## 2012-11-07 DIAGNOSIS — H43819 Vitreous degeneration, unspecified eye: Secondary | ICD-10-CM

## 2012-12-17 ENCOUNTER — Ambulatory Visit (INDEPENDENT_AMBULATORY_CARE_PROVIDER_SITE_OTHER): Payer: Medicare Other | Admitting: General Practice

## 2012-12-17 ENCOUNTER — Encounter: Payer: Self-pay | Admitting: Cardiology

## 2012-12-17 ENCOUNTER — Ambulatory Visit (INDEPENDENT_AMBULATORY_CARE_PROVIDER_SITE_OTHER): Payer: Medicare Other | Admitting: Cardiology

## 2012-12-17 VITALS — BP 140/60 | HR 59 | Ht 63.0 in | Wt 163.0 lb

## 2012-12-17 DIAGNOSIS — Z951 Presence of aortocoronary bypass graft: Secondary | ICD-10-CM | POA: Insufficient documentation

## 2012-12-17 DIAGNOSIS — E7800 Pure hypercholesterolemia, unspecified: Secondary | ICD-10-CM

## 2012-12-17 DIAGNOSIS — B0229 Other postherpetic nervous system involvement: Secondary | ICD-10-CM

## 2012-12-17 DIAGNOSIS — E785 Hyperlipidemia, unspecified: Secondary | ICD-10-CM

## 2012-12-17 DIAGNOSIS — I4819 Other persistent atrial fibrillation: Secondary | ICD-10-CM

## 2012-12-17 DIAGNOSIS — I4891 Unspecified atrial fibrillation: Secondary | ICD-10-CM

## 2012-12-17 DIAGNOSIS — I259 Chronic ischemic heart disease, unspecified: Secondary | ICD-10-CM

## 2012-12-17 LAB — HEPATIC FUNCTION PANEL
ALT: 28 U/L (ref 0–35)
AST: 23 U/L (ref 0–37)
Alkaline Phosphatase: 65 U/L (ref 39–117)
Bilirubin, Direct: 0 mg/dL (ref 0.0–0.3)
Total Bilirubin: 0.5 mg/dL (ref 0.3–1.2)

## 2012-12-17 LAB — BASIC METABOLIC PANEL
BUN: 16 mg/dL (ref 6–23)
Calcium: 9.1 mg/dL (ref 8.4–10.5)
Creatinine, Ser: 0.8 mg/dL (ref 0.4–1.2)
GFR: 74 mL/min (ref >60.00)
Glucose, Bld: 95 mg/dL (ref 70–99)

## 2012-12-17 LAB — LIPID PANEL: Triglycerides: 119 mg/dL (ref 0.0–149.0)

## 2012-12-17 NOTE — Assessment & Plan Note (Signed)
The patient had coronary bypass graft surgery about 12 years ago.  She has not had any recurrent angina pectoris.  Exercise tolerance is good

## 2012-12-17 NOTE — Progress Notes (Signed)
Quick Note:  Please report to patient. The recent labs are stable. Continue same medication and careful diet. Watch diet carefully. ______

## 2012-12-17 NOTE — Assessment & Plan Note (Signed)
The patient has not had any TIA or stroke symptoms.  She remains on long-term Coumadin.  Her INRs have been stable

## 2012-12-17 NOTE — Patient Instructions (Addendum)
Your physician recommends that you schedule a follow-up appointment in: 4 months. --April 19, 2013 at 8:15   Your physician recommends that you continue on your current medications as directed. Please refer to the Current Medication list given to you today.

## 2012-12-17 NOTE — Progress Notes (Signed)
Ellen Barajas Date of Birth:  12/06/1928 [address] [place], [place]  [address] [phone]         Fax   [phone]  History of Present Illness: This pleasant 79 year old woman is seen for a scheduled four-month followup office visit. She has a history of ischemic heart disease. She has had previous CABG. [name] been atrial fibrillation. She underwent cardioversion on 10/23/09 but 1 week later was back in atrial fibrillation. We have made no further attempts at cardioversion. She is essentially asymptomatic with her rhythm. She has a history of hypercholesterolemia and is on over-the-counter preparations. We are checking lipids today. Since last visit she has had no new cardiac symptoms. She remains on long-term Coumadin. Since the last saw her the patient had a case of severe shingles of the left buttock and inguinal area. She is still having a lot of postherpetic neuralgia.  The intensity of the pain has improved since last time however.   Current Outpatient Prescriptions  Medication Sig Dispense Refill  . aspirin 81 MG tablet Take 81 mg by mouth daily.        Marland Kitchen atenolol (TENORMIN) 25 MG tablet 1/4 DAILY  30 tablet  6  . Cholecalciferol (VITAMIN D-3 PO) Take by mouth. Taking 10,000 daily       . COD LIVER OIL PO Take by mouth at bedtime.        . Coenzyme Q10 (COQ10 PO) Take by mouth daily.        . fish oil-omega-3 fatty acids 1000 MG capsule Take by mouth 2 (two) times daily.        . Multiple Vitamin (MULTIVITAMIN) tablet Take 1 tablet by mouth daily.        . Multiple Vitamins-Minerals (EYE VITAMINS) TABS Take by mouth. Take 4 daily       . nitroGLYCERIN (NITROSTAT) 0.4 MG SL tablet Place 1 tablet (0.4 mg total) under the tongue every 5 (five) minutes as needed.  25 tablet  prn  . Red Yeast Rice Extract (RED YEAST RICE PO) Take by mouth 2 (two) times daily.        Marland Kitchen RESVERATROL PO Take 2 capsules by mouth daily.      Marland Kitchen warfarin (COUMADIN) 5 MG tablet Take as  directed by anticoagulation clinic  90 tablet  1   No current facility-administered medications for this visit.    Allergies  Allergen Reactions  . Crestor [Rosuvastatin Calcium]   . Lipitor [Atorvastatin Calcium]   . Niaspan [Niacin Er]   . Other     PER PATIENT SHE DOES NOT TOLERATE NARCOTICS  . Pravastatin   . Ramipril [Ramipril]     Hyperkalemia  . Welchol [Colesevelam Hcl]   . Zetia [Ezetimibe]     Patient Active Problem List   Diagnosis Date Noted  . Dyslipidemia     Priority: Medium  . Persistent atrial fibrillation 04/19/2010    Priority: Medium  . Hx of CABG 12/17/2012  . Post herpetic neuralgia 02/25/2012  . IHD (ischemic heart disease)   . Coronary artery disease     History  Smoking status  . Never Smoker   Smokeless tobacco  . Never Used    History  Alcohol Use No    Family History  Problem Relation Age of Onset  . CAD Father 28    MI  . CAD Mother 46    MI    Review of Systems: Constitutional: no fever chills diaphoresis or fatigue  or change in weight.  Head and neck: no hearing loss, no epistaxis, no photophobia or visual disturbance. Respiratory: No cough, shortness of breath or wheezing. Cardiovascular: No chest pain peripheral edema, palpitations. Gastrointestinal: No abdominal distention, no abdominal pain, no change in bowel habits hematochezia or melena. Genitourinary: No dysuria, no frequency, no urgency, no nocturia. Musculoskeletal:No arthralgias, no back pain, no gait disturbance or myalgias. Neurological: No dizziness, no headaches, no numbness, no seizures, no syncope, no weakness, no tremors. Hematologic: No lymphadenopathy, no easy bruising. Psychiatric: No confusion, no hallucinations, no sleep disturbance.    Physical Exam: Filed Vitals:   12/17/12 0831  BP: 140/60  Pulse: 59   the general appearance reveals a well-developed well-nourished woman in no distress.The head and neck exam reveals pupils equal and reactive.   Extraocular movements are full.  There is no scleral icterus.  The mouth and pharynx are normal.  The neck is supple.  The carotids reveal no bruits.  The jugular venous pressure is normal.  The  thyroid is not enlarged.  There is no lymphadenopathy.  The chest is clear to percussion and auscultation.  There are no rales or rhonchi.  Expansion of the chest is symmetrical.  The precordium is quiet.  The first heart sound is normal.  The second heart sound is physiologically split.  There is no murmur gallop rub or click.  There is no abnormal lift or heave.  The abdomen is soft and nontender.  The bowel sounds are normal.  The liver and spleen are not enlarged.  There are no abdominal masses.  There are no abdominal bruits.  Extremities reveal good pedal pulses.  There is no phlebitis or edema.  There is no cyanosis or clubbing.  Strength is normal and symmetrical in all extremities.  There is no lateralizing weakness.  There are no sensory deficits.  The skin is warm and dry.  There is no rash.  EKG today shows atrial fibrillation with controlled ventricular response.  No ischemic changes.   Assessment / Plan: Continue same medication.  Recheck in 4 months for office visit.

## 2012-12-17 NOTE — Assessment & Plan Note (Signed)
Patient has a history of dyslipidemia.  We are checking her fasting lab work today.  She is intolerant of statins.

## 2012-12-19 ENCOUNTER — Encounter (INDEPENDENT_AMBULATORY_CARE_PROVIDER_SITE_OTHER): Payer: Medicare Other | Admitting: Ophthalmology

## 2012-12-19 DIAGNOSIS — H43819 Vitreous degeneration, unspecified eye: Secondary | ICD-10-CM

## 2012-12-19 DIAGNOSIS — H3530 Unspecified macular degeneration: Secondary | ICD-10-CM

## 2013-02-08 ENCOUNTER — Ambulatory Visit (INDEPENDENT_AMBULATORY_CARE_PROVIDER_SITE_OTHER): Payer: Medicare Other | Admitting: *Deleted

## 2013-02-08 DIAGNOSIS — I4819 Other persistent atrial fibrillation: Secondary | ICD-10-CM

## 2013-02-08 DIAGNOSIS — I4891 Unspecified atrial fibrillation: Secondary | ICD-10-CM

## 2013-02-08 LAB — POCT INR: INR: 2.7

## 2013-02-18 ENCOUNTER — Other Ambulatory Visit: Payer: Self-pay | Admitting: *Deleted

## 2013-02-18 ENCOUNTER — Telehealth: Payer: Self-pay | Admitting: *Deleted

## 2013-02-18 DIAGNOSIS — I4891 Unspecified atrial fibrillation: Secondary | ICD-10-CM

## 2013-02-18 MED ORDER — WARFARIN SODIUM 5 MG PO TABS: ORAL_TABLET | ORAL | Status: DC

## 2013-02-18 MED ORDER — ATENOLOL 25 MG PO TABS: ORAL_TABLET | ORAL | Status: DC

## 2013-02-18 NOTE — Telephone Encounter (Signed)
Patient requests coumadin refill to be sent to [facility]. Thanks, MI

## 2013-03-20 ENCOUNTER — Ambulatory Visit (INDEPENDENT_AMBULATORY_CARE_PROVIDER_SITE_OTHER): Payer: Medicare Other | Admitting: *Deleted

## 2013-03-20 ENCOUNTER — Encounter (INDEPENDENT_AMBULATORY_CARE_PROVIDER_SITE_OTHER): Payer: Medicare Other | Admitting: Ophthalmology

## 2013-03-20 DIAGNOSIS — I4819 Other persistent atrial fibrillation: Secondary | ICD-10-CM

## 2013-03-20 DIAGNOSIS — H43819 Vitreous degeneration, unspecified eye: Secondary | ICD-10-CM

## 2013-03-20 DIAGNOSIS — H3530 Unspecified macular degeneration: Secondary | ICD-10-CM

## 2013-03-20 DIAGNOSIS — H33309 Unspecified retinal break, unspecified eye: Secondary | ICD-10-CM

## 2013-03-20 DIAGNOSIS — I4891 Unspecified atrial fibrillation: Secondary | ICD-10-CM

## 2013-03-20 LAB — POCT INR: INR: 1.9

## 2013-04-09 ENCOUNTER — Telehealth: Payer: Self-pay | Admitting: Cardiology

## 2013-04-09 NOTE — Telephone Encounter (Signed)
Walk In pt Form " Sealed En[facility]" [facility]

## 2013-04-19 ENCOUNTER — Ambulatory Visit (INDEPENDENT_AMBULATORY_CARE_PROVIDER_SITE_OTHER): Payer: Medicare Other | Admitting: *Deleted

## 2013-04-19 ENCOUNTER — Ambulatory Visit (INDEPENDENT_AMBULATORY_CARE_PROVIDER_SITE_OTHER): Payer: Medicare Other | Admitting: Cardiology

## 2013-04-19 ENCOUNTER — Encounter: Payer: Self-pay | Admitting: Cardiology

## 2013-04-19 VITALS — BP 136/64 | HR 50 | Ht 63.0 in | Wt 167.0 lb

## 2013-04-19 DIAGNOSIS — E785 Hyperlipidemia, unspecified: Secondary | ICD-10-CM

## 2013-04-19 DIAGNOSIS — I4891 Unspecified atrial fibrillation: Secondary | ICD-10-CM

## 2013-04-19 DIAGNOSIS — Z951 Presence of aortocoronary bypass graft: Secondary | ICD-10-CM

## 2013-04-19 DIAGNOSIS — M1990 Unspecified osteoarthritis, unspecified site: Secondary | ICD-10-CM

## 2013-04-19 DIAGNOSIS — I4819 Other persistent atrial fibrillation: Secondary | ICD-10-CM

## 2013-04-19 DIAGNOSIS — B0229 Other postherpetic nervous system involvement: Secondary | ICD-10-CM

## 2013-04-19 LAB — POCT INR: INR: 2.5

## 2013-04-19 NOTE — Assessment & Plan Note (Signed)
She has moderate osteoarthritis of the knees.  She wears a brace on the knee which helps.  I also suggested that she try some Tylenol over-the-counter.

## 2013-04-19 NOTE — Assessment & Plan Note (Signed)
No angina pectoris.  She has not had to take nitroglycerin.

## 2013-04-19 NOTE — Assessment & Plan Note (Addendum)
The patient has permanent atrial fibrillation.  She is on Coumadin.  She has had no thromboembolic events.

## 2013-04-19 NOTE — Progress Notes (Signed)
Ellen Barajas Date of Birth:  [birth year] [address] [place], [place]  [address] [phone]         Fax   [phone]  History of Present Illness: This pleasant 80 year old woman is seen for a scheduled four-month followup office visit. She has a history of ischemic heart disease. She has had previous CABG. [name] been atrial fibrillation. She underwent cardioversion on 10/23/09 but 1 week later was back in atrial fibrillation. We have made no further attempts at cardioversion. She is essentially asymptomatic with her rhythm. She has a history of hypercholesterolemia and is on over-the-counter preparations. We are checking lipids today. Since last visit she has had no new cardiac symptoms. She remains on long-term Coumadin.  Her post herpetic neuralgia pain is improved.  She has osteoarthritis of the knees.   Current Outpatient Prescriptions  Medication Sig Dispense Refill  . aspirin 81 MG tablet Take 81 mg by mouth daily.        Marland Kitchen atenolol (TENORMIN) 25 MG tablet 1/4 DAILY  30 tablet  3  . Cholecalciferol (VITAMIN D-3 PO) Take by mouth. Taking 10,000 daily       . COD LIVER OIL PO Take by mouth at bedtime.        . Coenzyme Q10 (COQ10 PO) Take by mouth daily.        . fish oil-omega-3 fatty acids 1000 MG capsule Take by mouth 2 (two) times daily.        . Multiple Vitamin (MULTIVITAMIN) tablet Take 1 tablet by mouth daily.        . Multiple Vitamins-Minerals (EYE VITAMINS) TABS Take by mouth. Take 4 daily       . nitroGLYCERIN (NITROSTAT) 0.4 MG SL tablet Place 1 tablet (0.4 mg total) under the tongue every 5 (five) minutes as needed.  25 tablet  prn  . Red Yeast Rice Extract (RED YEAST RICE PO) Take by mouth 2 (two) times daily.        Marland Kitchen RESVERATROL PO Take 2 capsules by mouth daily.      Marland Kitchen warfarin (COUMADIN) 5 MG tablet Take as directed by anticoagulation clinic  90 tablet  1   No current facility-administered medications for this visit.    Allergies  Allergen  Reactions  . Crestor [Rosuvastatin Calcium]   . Lipitor [Atorvastatin Calcium]   . Niaspan [Niacin Er]   . Other     PER PATIENT SHE DOES NOT TOLERATE NARCOTICS  . Pravastatin   . Ramipril [Ramipril]     Hyperkalemia  . Welchol [Colesevelam Hcl]   . Zetia [Ezetimibe]     Patient Active Problem List   Diagnosis Date Noted  . Dyslipidemia     Priority: Medium  . Persistent atrial fibrillation 04/19/2010    Priority: Medium  . Osteoarthritis 04/19/2013  . Hx of CABG 12/17/2012  . Post herpetic neuralgia 02/25/2012  . IHD (ischemic heart disease)   . Coronary artery disease     History  Smoking status  . Never Smoker   Smokeless tobacco  . Never Used    History  Alcohol Use No    Family History  Problem Relation Age of Onset  . CAD Father 89    MI  . CAD Mother 80    MI    Review of Systems: Constitutional: no fever chills diaphoresis or fatigue or change in weight.  Head and neck: no hearing loss, no epistaxis, no photophobia or visual disturbance. Respiratory: No cough, shortness  of breath or wheezing. Cardiovascular: No chest pain peripheral edema, palpitations. Gastrointestinal: No abdominal distention, no abdominal pain, no change in bowel habits hematochezia or melena. Genitourinary: No dysuria, no frequency, no urgency, no nocturia. Musculoskeletal:No arthralgias, no back pain, no gait disturbance or myalgias. Neurological: No dizziness, no headaches, no numbness, no seizures, no syncope, no weakness, no tremors. Hematologic: No lymphadenopathy, no easy bruising. Psychiatric: No confusion, no hallucinations, no sleep disturbance.    Physical Exam: Filed Vitals:   04/19/13 0821  BP: 136/64  Pulse: 50   the general appearance reveals a well-developed well-nourished woman in no distress.The head and neck exam reveals pupils equal and reactive.  Extraocular movements are full.  There is no scleral icterus.  The mouth and pharynx are normal.  The neck is  supple.  The carotids reveal no bruits.  The jugular venous pressure is normal.  The  thyroid is not enlarged.  There is no lymphadenopathy.  The chest is clear to percussion and auscultation.  There are no rales or rhonchi.  Expansion of the chest is symmetrical.  The precordium is quiet.  The first heart sound is normal.  The second heart sound is physiologically split.  There is no murmur gallop rub or click.  There is no abnormal lift or heave.  The abdomen is soft and nontender.  The bowel sounds are normal.  The liver and spleen are not enlarged.  There are no abdominal masses.  There are no abdominal bruits.  Extremities reveal good pedal pulses.  There is no phlebitis or edema.  There is no cyanosis or clubbing.  Strength is normal and symmetrical in all extremities.  There is no lateralizing weakness.  There are no sensory deficits.  The skin is warm and dry.  There is no rash.     Assessment / Plan: Continue same medication.  Recheck in 4 months for office visit, lipid panel, hepatic function panel, basal metabolic panel, and CBC. Her weight is up 4 pounds.  She will work harder on diet.

## 2013-04-19 NOTE — Assessment & Plan Note (Signed)
The patient has dyslipidemia.  She refuses statins.  She does take red yeast rice and resverinol

## 2013-04-19 NOTE — Patient Instructions (Signed)
Your physician recommends that you continue on your current medications as directed. Please refer to the Current Medication list given to you today.  Your physician wants you to follow-up in: 4 months with fasting labs (lp/bmet/hfp/cbc)  You will receive a reminder letter in the mail two months in advance. If you don't receive a letter, please call our office to schedule the follow-up appointment.  

## 2013-05-31 ENCOUNTER — Ambulatory Visit (INDEPENDENT_AMBULATORY_CARE_PROVIDER_SITE_OTHER): Payer: Medicare Other

## 2013-05-31 DIAGNOSIS — I4819 Other persistent atrial fibrillation: Secondary | ICD-10-CM

## 2013-05-31 DIAGNOSIS — I4891 Unspecified atrial fibrillation: Secondary | ICD-10-CM

## 2013-05-31 LAB — POCT INR: INR: 2.3

## 2013-07-12 ENCOUNTER — Ambulatory Visit (INDEPENDENT_AMBULATORY_CARE_PROVIDER_SITE_OTHER): Payer: Medicare Other | Admitting: Pharmacist

## 2013-07-12 DIAGNOSIS — I4819 Other persistent atrial fibrillation: Secondary | ICD-10-CM

## 2013-07-12 DIAGNOSIS — I4891 Unspecified atrial fibrillation: Secondary | ICD-10-CM

## 2013-07-12 LAB — POCT INR: INR: 1.7

## 2013-07-17 ENCOUNTER — Encounter (INDEPENDENT_AMBULATORY_CARE_PROVIDER_SITE_OTHER): Payer: Medicare Other | Admitting: Ophthalmology

## 2013-07-18 ENCOUNTER — Encounter (INDEPENDENT_AMBULATORY_CARE_PROVIDER_SITE_OTHER): Payer: Medicare Other | Admitting: Ophthalmology

## 2013-07-18 DIAGNOSIS — H33309 Unspecified retinal break, unspecified eye: Secondary | ICD-10-CM

## 2013-07-18 DIAGNOSIS — H43819 Vitreous degeneration, unspecified eye: Secondary | ICD-10-CM

## 2013-07-18 DIAGNOSIS — H3530 Unspecified macular degeneration: Secondary | ICD-10-CM

## 2013-08-19 ENCOUNTER — Ambulatory Visit (INDEPENDENT_AMBULATORY_CARE_PROVIDER_SITE_OTHER): Payer: Medicare Other | Admitting: Cardiology

## 2013-08-19 ENCOUNTER — Encounter: Payer: Self-pay | Admitting: Cardiology

## 2013-08-19 ENCOUNTER — Ambulatory Visit (INDEPENDENT_AMBULATORY_CARE_PROVIDER_SITE_OTHER): Payer: Medicare Other | Admitting: Pharmacist

## 2013-08-19 ENCOUNTER — Other Ambulatory Visit (INDEPENDENT_AMBULATORY_CARE_PROVIDER_SITE_OTHER): Payer: Medicare Other

## 2013-08-19 VITALS — BP 138/78 | HR 66 | Ht 63.0 in | Wt 161.8 lb

## 2013-08-19 DIAGNOSIS — B0229 Other postherpetic nervous system involvement: Secondary | ICD-10-CM

## 2013-08-19 DIAGNOSIS — I4819 Other persistent atrial fibrillation: Secondary | ICD-10-CM

## 2013-08-19 DIAGNOSIS — I259 Chronic ischemic heart disease, unspecified: Secondary | ICD-10-CM

## 2013-08-19 DIAGNOSIS — I4891 Unspecified atrial fibrillation: Secondary | ICD-10-CM

## 2013-08-19 DIAGNOSIS — E785 Hyperlipidemia, unspecified: Secondary | ICD-10-CM

## 2013-08-19 DIAGNOSIS — E7800 Pure hypercholesterolemia, unspecified: Secondary | ICD-10-CM

## 2013-08-19 DIAGNOSIS — Z951 Presence of aortocoronary bypass graft: Secondary | ICD-10-CM

## 2013-08-19 LAB — CBC WITH DIFFERENTIAL/PLATELET
Basophils Absolute: 0 10*3/uL (ref 0.0–0.1)
Basophils Relative: 0.2 % (ref 0.0–3.0)
Eosinophils Absolute: 0.3 10*3/uL (ref 0.0–0.7)
Eosinophils Relative: 4.6 % (ref 0.0–5.0)
HCT: 42.8 % (ref 36.0–46.0)
Hemoglobin: 14.2 g/dL (ref 12.0–15.0)
LYMPHS PCT: 31 % (ref 12.0–46.0)
Lymphs Abs: 1.7 10*3/uL (ref 0.7–4.0)
MCHC: 33.1 g/dL (ref 30.0–36.0)
MCV: 90.6 fl (ref 78.0–100.0)
MONOS PCT: 9.6 % (ref 3.0–12.0)
Monocytes Absolute: 0.5 10*3/uL (ref 0.1–1.0)
NEUTROS ABS: 3.1 10*3/uL (ref 1.4–7.7)
NEUTROS PCT: 54.6 % (ref 43.0–77.0)
PLATELETS: 244 10*3/uL (ref 150.0–400.0)
RBC: 4.72 Mil/uL (ref 3.87–5.11)
RDW: 14.4 % (ref 11.5–15.5)
WBC: 5.6 10*3/uL (ref 4.0–10.5)

## 2013-08-19 LAB — BASIC METABOLIC PANEL
BUN: 17 mg/dL (ref 6–23)
CALCIUM: 9.3 mg/dL (ref 8.4–10.5)
CO2: 26 meq/L (ref 19–32)
CREATININE: 0.7 mg/dL (ref 0.4–1.2)
Chloride: 105 mEq/L (ref 96–112)
GFR: 79.67 mL/min (ref >60.00)
GLUCOSE: 97 mg/dL (ref 70–99)
Potassium: 4.6 mEq/L (ref 3.5–5.1)
Sodium: 140 mEq/L (ref 135–145)

## 2013-08-19 LAB — LIPID PANEL
Cholesterol: 194 mg/dL (ref 0–200)
HDL: 50.3 mg/dL (ref >39.00)
LDL Cholesterol: 120 mg/dL — ABNORMAL HIGH (ref 0–99)
NONHDL: 143.7
Total CHOL/HDL Ratio: 4
Triglycerides: 118 mg/dL (ref 0.0–149.0)
VLDL: 23.6 mg/dL (ref 0.0–40.0)

## 2013-08-19 LAB — HEPATIC FUNCTION PANEL
ALK PHOS: 68 U/L (ref 39–117)
ALT: 24 U/L (ref 0–35)
AST: 22 U/L (ref 0–37)
Albumin: 4 g/dL (ref 3.5–5.2)
BILIRUBIN TOTAL: 0.8 mg/dL (ref 0.2–1.2)
Bilirubin, Direct: 0.1 mg/dL (ref 0.0–0.3)
Total Protein: 6.9 g/dL (ref 6.0–8.3)

## 2013-08-19 LAB — POCT INR: INR: 2.9

## 2013-08-19 MED ORDER — WARFARIN SODIUM 5 MG PO TABS: ORAL_TABLET | ORAL | Status: DC

## 2013-08-19 MED ORDER — ATENOLOL 25 MG PO TABS: ORAL_TABLET | ORAL | Status: DC

## 2013-08-19 NOTE — Assessment & Plan Note (Signed)
And is on long-term warfarin.  She is a bleeding problems.  She has had no TIA symptoms

## 2013-08-19 NOTE — Progress Notes (Signed)
Ellen Barajas Date of Birth:  July 15, 1928 [facility] [address] [place], [place]  [address] [phone]        Fax   [phone]   History of Present Illness: This pleasant 80 year old woman is seen for a scheduled four-month followup office visit. She has a history of ischemic heart disease. She has had previous CABG. [name] been atrial fibrillation. She underwent cardioversion on 10/23/09 but 1 week later was back in atrial fibrillation. We have made no further attempts at cardioversion. She is essentially asymptomatic with her rhythm. She has a history of hypercholesterolemia and is on over-the-counter preparations. We are checking lipids today. Since last visit she has had no new cardiac symptoms. She remains on long-term Coumadin. Her post herpetic neuralgia pain is improved. She has osteoarthritis of the knees.   Current Outpatient Prescriptions  Medication Sig Dispense Refill  . aspirin 81 MG tablet Take 81 mg by mouth daily.        Marland Kitchen atenolol (TENORMIN) 25 MG tablet 1/4 DAILY  40 tablet  3  . Cholecalciferol (VITAMIN D-3 PO) Take by mouth. Taking 10,000 daily       . COD LIVER OIL PO Take by mouth at bedtime.        . Coenzyme Q10 (COQ10 PO) Take by mouth daily.        . fish oil-omega-3 fatty acids 1000 MG capsule Take by mouth 2 (two) times daily.        . Multiple Vitamin (MULTIVITAMIN) tablet Take 1 tablet by mouth daily.        . Multiple Vitamins-Minerals (EYE VITAMINS) TABS Take by mouth. Take 4 daily       . nitroGLYCERIN (NITROSTAT) 0.4 MG SL tablet Place 1 tablet (0.4 mg total) under the tongue every 5 (five) minutes as needed.  25 tablet  prn  . Red Yeast Rice Extract (RED YEAST RICE PO) Take by mouth 2 (two) times daily.        Marland Kitchen RESVERATROL PO Take 2 capsules by mouth daily.      Marland Kitchen warfarin (COUMADIN) 5 MG tablet Take as directed by anticoagulation clinic  90 tablet  1   No current facility-administered medications for this visit.     Allergies  Allergen Reactions  . Crestor [Rosuvastatin Calcium]   . Lipitor [Atorvastatin Calcium]   . Niaspan [Niacin Er]   . Other     PER PATIENT SHE DOES NOT TOLERATE NARCOTICS  . Pravastatin   . Ramipril [Ramipril]     Hyperkalemia  . Welchol [Colesevelam Hcl]   . Zetia [Ezetimibe]     Patient Active Problem List   Diagnosis Date Noted  . Dyslipidemia     Priority: Medium  . Persistent atrial fibrillation 04/19/2010    Priority: Medium  . Osteoarthritis 04/19/2013  . Hx of CABG 12/17/2012  . Post herpetic neuralgia 02/25/2012  . IHD (ischemic heart disease)   . Coronary artery disease     History  Smoking status  . Never Smoker   Smokeless tobacco  . Never Used    History  Alcohol Use No    Family History  Problem Relation Age of Onset  . CAD Father 26    MI  . CAD Mother 69    MI    Review of Systems: Constitutional: no fever chills diaphoresis or fatigue or change in weight.  Head and neck: no hearing loss, no epistaxis, no photophobia or visual disturbance. Respiratory: No  cough, shortness of breath or wheezing. Cardiovascular: No chest pain peripheral edema, palpitations. Gastrointestinal: No abdominal distention, no abdominal pain, no change in bowel habits hematochezia or melena. Genitourinary: No dysuria, no frequency, no urgency, no nocturia. Musculoskeletal:No arthralgias, no back pain, no gait disturbance or myalgias. Neurological: No dizziness, no headaches, no numbness, no seizures, no syncope, no weakness, no tremors. Hematologic: No lymphadenopathy, no easy bruising. Psychiatric: No confusion, no hallucinations, no sleep disturbance.    Physical Exam: Filed Vitals:   08/19/13 0929  BP: 138/78  Pulse: 66   the general appearance reveals a well-developed well-nourished woman in no distress.The head and neck exam reveals pupils equal and reactive.  Extraocular movements are full.  There is no scleral icterus.  The mouth and  pharynx are normal.  The neck is supple.  The carotids reveal no bruits.  The jugular venous pressure is normal.  The  thyroid is not enlarged.  There is no lymphadenopathy.  The chest is clear to percussion and auscultation.  There are no rales or rhonchi.  Expansion of the chest is symmetrical.  The precordium is quiet.  The pulse is irregularly irregular  The first heart sound is normal.  The second heart sound is physiologically split.  There is no murmur gallop rub or click.  There is no abnormal lift or heave.  The abdomen is soft and nontender.  The bowel sounds are normal.  The liver and spleen are not enlarged.  There are no abdominal masses.  There are no abdominal bruits.  Extremities reveal good pedal pulses.  There is no phlebitis or edema.  There is no cyanosis or clubbing.  Strength is normal and symmetrical in all extremities.  There is no lateralizing weakness.  There are no sensory deficits.  The skin is warm and dry.  There is scarring from previous herpes zoster left lower abdomen.     Assessment / Plan: 1.  Ischemic heart disease status post CABG 2. permanent atrial fibrillation 3. Hypercholesterolemia 4.  Post herpetic neuralgia  Plan: Await results of today's labs.  Recheck in 4 months for office visit lipid panel hepatic function panel and basal metabolic panel.

## 2013-08-19 NOTE — Assessment & Plan Note (Signed)
Patient has history of dyslipidemia treated with careful diet.  She is intolerant of statins.  Lab work was drawn and is pending.

## 2013-08-19 NOTE — Patient Instructions (Signed)
Will obtain labs today and call you with the results (lp/bmet/hfp/cbc)  Your physician recommends that you continue on your current medications as directed. Please refer to the Current Medication list given to you today.  Your physician recommends that you schedule a follow-up appointment in: 4 months with fasting labs (lp/bmet/hfp)  

## 2013-08-19 NOTE — Progress Notes (Signed)
Quick Note:  Please report to patient. The recent labs are stable. Continue same medication and careful diet.Cholesterol is better. ______

## 2013-08-19 NOTE — Assessment & Plan Note (Signed)
The patient has had no recurrent angina pectoris. 

## 2013-08-19 NOTE — Assessment & Plan Note (Signed)
Her post herpetic neuralgia of the left lower abdomen and thigh area is gradually improving.  She is still applying moisturizing cream to the skin.

## 2013-10-10 ENCOUNTER — Other Ambulatory Visit: Payer: Self-pay | Admitting: Cardiology

## 2013-10-10 ENCOUNTER — Encounter: Payer: Self-pay | Admitting: Cardiology

## 2013-10-14 ENCOUNTER — Ambulatory Visit (INDEPENDENT_AMBULATORY_CARE_PROVIDER_SITE_OTHER): Payer: Medicare Other | Admitting: Pharmacist

## 2013-10-14 DIAGNOSIS — I4891 Unspecified atrial fibrillation: Secondary | ICD-10-CM

## 2013-10-14 DIAGNOSIS — I4819 Other persistent atrial fibrillation: Secondary | ICD-10-CM

## 2013-10-14 LAB — POCT INR: INR: 2.9

## 2013-11-28 ENCOUNTER — Encounter (INDEPENDENT_AMBULATORY_CARE_PROVIDER_SITE_OTHER): Payer: Medicare Other | Admitting: Ophthalmology

## 2013-11-28 DIAGNOSIS — H43813 Vitreous degeneration, bilateral: Secondary | ICD-10-CM

## 2013-11-28 DIAGNOSIS — H3531 Nonexudative age-related macular degeneration: Secondary | ICD-10-CM

## 2013-12-09 ENCOUNTER — Ambulatory Visit (INDEPENDENT_AMBULATORY_CARE_PROVIDER_SITE_OTHER): Payer: Medicare Other | Admitting: *Deleted

## 2013-12-09 DIAGNOSIS — I4819 Other persistent atrial fibrillation: Secondary | ICD-10-CM

## 2013-12-09 DIAGNOSIS — I481 Persistent atrial fibrillation: Secondary | ICD-10-CM

## 2013-12-09 DIAGNOSIS — I4891 Unspecified atrial fibrillation: Secondary | ICD-10-CM

## 2013-12-09 LAB — POCT INR: INR: 2.7

## 2014-02-03 ENCOUNTER — Ambulatory Visit (INDEPENDENT_AMBULATORY_CARE_PROVIDER_SITE_OTHER): Payer: Medicare Other | Admitting: Surgery

## 2014-02-03 DIAGNOSIS — I4891 Unspecified atrial fibrillation: Secondary | ICD-10-CM

## 2014-02-03 DIAGNOSIS — I4819 Other persistent atrial fibrillation: Secondary | ICD-10-CM

## 2014-02-03 DIAGNOSIS — I481 Persistent atrial fibrillation: Secondary | ICD-10-CM

## 2014-02-03 LAB — POCT INR: INR: 2.8

## 2014-03-31 ENCOUNTER — Ambulatory Visit (INDEPENDENT_AMBULATORY_CARE_PROVIDER_SITE_OTHER): Payer: Medicare Other

## 2014-03-31 DIAGNOSIS — I4891 Unspecified atrial fibrillation: Secondary | ICD-10-CM

## 2014-03-31 DIAGNOSIS — I4819 Other persistent atrial fibrillation: Secondary | ICD-10-CM

## 2014-03-31 DIAGNOSIS — I481 Persistent atrial fibrillation: Secondary | ICD-10-CM

## 2014-03-31 LAB — POCT INR: INR: 2.9

## 2014-04-01 ENCOUNTER — Encounter: Payer: Self-pay | Admitting: Cardiology

## 2014-04-15 ENCOUNTER — Other Ambulatory Visit: Payer: Self-pay | Admitting: *Deleted

## 2014-04-15 MED ORDER — WARFARIN SODIUM 5 MG PO TABS: ORAL_TABLET | ORAL | Status: DC

## 2014-05-01 ENCOUNTER — Encounter (INDEPENDENT_AMBULATORY_CARE_PROVIDER_SITE_OTHER): Payer: Medicare Other | Admitting: Ophthalmology

## 2014-05-01 DIAGNOSIS — H43813 Vitreous degeneration, bilateral: Secondary | ICD-10-CM | POA: Diagnosis not present

## 2014-05-01 DIAGNOSIS — H3531 Nonexudative age-related macular degeneration: Secondary | ICD-10-CM | POA: Diagnosis not present

## 2014-05-27 ENCOUNTER — Ambulatory Visit (INDEPENDENT_AMBULATORY_CARE_PROVIDER_SITE_OTHER): Payer: Medicare Other | Admitting: Cardiology

## 2014-05-27 ENCOUNTER — Encounter: Payer: Self-pay | Admitting: Cardiology

## 2014-05-27 ENCOUNTER — Ambulatory Visit (INDEPENDENT_AMBULATORY_CARE_PROVIDER_SITE_OTHER): Payer: Medicare Other | Admitting: *Deleted

## 2014-05-27 ENCOUNTER — Other Ambulatory Visit (INDEPENDENT_AMBULATORY_CARE_PROVIDER_SITE_OTHER): Payer: Medicare Other | Admitting: *Deleted

## 2014-05-27 VITALS — BP 130/62 | HR 73 | Ht 63.0 in | Wt 163.0 lb

## 2014-05-27 DIAGNOSIS — I259 Chronic ischemic heart disease, unspecified: Secondary | ICD-10-CM

## 2014-05-27 DIAGNOSIS — I481 Persistent atrial fibrillation: Secondary | ICD-10-CM

## 2014-05-27 DIAGNOSIS — I4891 Unspecified atrial fibrillation: Secondary | ICD-10-CM

## 2014-05-27 DIAGNOSIS — E7800 Pure hypercholesterolemia, unspecified: Secondary | ICD-10-CM

## 2014-05-27 DIAGNOSIS — I4819 Other persistent atrial fibrillation: Secondary | ICD-10-CM

## 2014-05-27 DIAGNOSIS — Z5181 Encounter for therapeutic drug level monitoring: Secondary | ICD-10-CM

## 2014-05-27 LAB — POCT INR: INR: 2.8

## 2014-05-27 LAB — HEPATIC FUNCTION PANEL
ALT: 17 U/L (ref 0–35)
AST: 18 U/L (ref 0–37)
Albumin: 4.1 g/dL (ref 3.5–5.2)
Alkaline Phosphatase: 69 U/L (ref 39–117)
BILIRUBIN DIRECT: 0.1 mg/dL (ref 0.0–0.3)
BILIRUBIN TOTAL: 0.5 mg/dL (ref 0.2–1.2)
Total Protein: 6.9 g/dL (ref 6.0–8.3)

## 2014-05-27 LAB — LIPID PANEL
CHOL/HDL RATIO: 4
Cholesterol: 209 mg/dL — ABNORMAL HIGH (ref 0–200)
HDL: 55.4 mg/dL (ref >39.00)
LDL Cholesterol: 131 mg/dL — ABNORMAL HIGH (ref 0–99)
NONHDL: 153.6
Triglycerides: 111 mg/dL (ref 0.0–149.0)
VLDL: 22.2 mg/dL (ref 0.0–40.0)

## 2014-05-27 LAB — BASIC METABOLIC PANEL
BUN: 12 mg/dL (ref 6–23)
CO2: 30 mEq/L (ref 19–32)
Calcium: 9.5 mg/dL (ref 8.4–10.5)
Chloride: 107 mEq/L (ref 96–112)
Creatinine, Ser: 0.87 mg/dL (ref 0.40–1.20)
GFR: 65.97 mL/min (ref >60.00)
GLUCOSE: 94 mg/dL (ref 70–99)
POTASSIUM: 4.4 meq/L (ref 3.5–5.1)
Sodium: 140 mEq/L (ref 135–145)

## 2014-05-27 NOTE — Addendum Note (Signed)
Addended by: Tonita PhoenixBOWDEN, Rianne Degraaf K on: 05/27/2014 09:18 AM   Modules accepted: Orders

## 2014-05-27 NOTE — Progress Notes (Signed)
Cardiology Office Note   Date:  05/27/2014   ID:  ILDA LASKIN, DOB [birth year], MRN [id number]  PCP:  Eustaquio Boyden, MD  Cardiologist: Cassell Clement MD  No chief complaint on file.     History of Present Illness: Ellen Barajas is a 81 y.o. female who presents for scheduled visit  This pleasant 81 year old woman is seen for a scheduled four-month followup office visit. She has a history of ischemic heart disease. She has had previous CABG. [name] been atrial fibrillation. She underwent cardioversion on 10/23/09 but 1 week later was back in atrial fibrillation. We have made no further attempts at cardioversion. She is essentially asymptomatic with her rhythm. She has a history of hypercholesterolemia and is on over-the-counter preparations. We are checking lipids today. Since last visit she has had no new cardiac symptoms. She remains on long-term Coumadin. Her post herpetic neuralgia pain is improved. She has osteoarthritis of the knees.  She does not take anything for it.  I suggested that she try some generic Tylenol  Past Medical History  Diagnosis Date  . AF (atrial fibrillation)     coumadin  . IHD (ischemic heart disease)   . Dyslipidemia   . Coronary artery disease 2002    s/p CABG  . Post herpetic neuralgia 02/2012  . Arthritis   . Diverticulosis   . History of chicken pox     Past Surgical History  Procedure Laterality Date  . Coronary artery bypass graft  2002    x 4  . Cataract extraction  2013    bilateral  . Breast lumpectomy      left     Current Outpatient Prescriptions  Medication Sig Dispense Refill  . acetaminophen (TYLENOL) 325 MG tablet Take 325 mg by mouth every 6 (six) hours as needed.    Marland Kitchen aspirin 81 MG tablet Take 81 mg by mouth daily.      Marland Kitchen atenolol (TENORMIN) 25 MG tablet Take by mouth daily. Take 1/4 tablet by mouth daily    . Cholecalciferol (VITAMIN D-3 PO) Take 10,000 Units by mouth daily. Taking 10,000 daily    . COD LIVER OIL PO  Take 1 tablet by mouth at bedtime.     . Coenzyme Q10 (COQ10 PO) Take 1 tablet by mouth daily.     . fish oil-omega-3 fatty acids 1000 MG capsule Take 1 capsule by mouth 2 (two) times daily.     . Multiple Vitamin (MULTIVITAMIN) tablet Take 1 tablet by mouth daily.      . Multiple Vitamins-Minerals (EYE VITAMINS) TABS Take 4 tablets by mouth daily. Take 4 daily    . nitroGLYCERIN (NITROSTAT) 0.4 MG SL tablet Place 0.4 mg under the tongue every 5 (five) minutes as needed for chest pain (for chest pain).    . Red Yeast Rice Extract (RED YEAST RICE PO) Take by mouth 2 (two) times daily.      Marland Kitchen RESVERATROL PO Take 2 capsules by mouth daily.    Marland Kitchen warfarin (COUMADIN) 5 MG tablet Take as directed by anticoagulation clinic 90 tablet 1   No current facility-administered medications for this visit.    Allergies:   Crestor; Lipitor; Niaspan; Other; Pravastatin; Ramipril; Welchol; and Zetia    Social History:  The patient  reports that she has never smoked. She has never used smokeless tobacco. She reports that she does not drink alcohol or use illicit drugs.   Family History:  The patient's family history includes CAD (age  of onset: 2674) in her father; CAD (age of onset: 4690) in her mother.    ROS:  Please see the history of present illness.   Otherwise, review of systems are positive for none.   All other systems are reviewed and negative.    PHYSICAL EXAM: VS:  BP 130/62 mmHg  Pulse 73  Ht 5\' 3"  (1.6 m)  Wt 163 lb (73.936 kg)  BMI 28.88 kg/m2 , BMI Body mass index is 28.88 kg/(m^2). GEN: Well nourished, well developed, in no acute distress HEENT: normal Neck: no JVD, carotid bruits, or masses Cardiac: Irregularly irregular; no murmurs, rubs, or gallops,no edema  Respiratory:  clear to auscultation bilaterally, normal work of breathing GI: soft, nontender, nondistended, + BS MS: no deformity or atrophy Skin: warm and dry, no rash Neuro:  Strength and sensation are intact Psych: euthymic  mood, full affect   EKG:  EKG is not ordered today.    Recent Labs: 08/19/2013: Hemoglobin 14.2; Platelets 244.0 05/27/2014: ALT 17; BUN 12; Creatinine 0.87; Potassium 4.4; Sodium 140    Lipid Panel    Component Value Date/Time   CHOL 209* 05/27/2014 0918   TRIG 111.0 05/27/2014 0918   HDL 55.40 05/27/2014 0918   CHOLHDL 4 05/27/2014 0918   VLDL 22.2 05/27/2014 0918   LDLCALC 131* 05/27/2014 0918   LDLDIRECT 147.5 12/17/2012 0920      Wt Readings from Last 3 Encounters:  05/27/14 163 lb (73.936 kg)  08/19/13 161 lb 12.8 oz (73.392 kg)  04/19/13 167 lb (75.751 kg)        ASSESSMENT AND PLAN:  1. Ischemic heart disease status post CABG 2. permanent atrial fibrillation 3. Hypercholesterolemia 4. Post herpetic neuralgia  Plan: Await results of today's labs. Recheck in 6 months for office visit lipid panel hepatic function panel and basal metabolic panel.   Current medicines are reviewed at length with the patient today.  The patient does not have concerns regarding medicines.  The following changes have been made:  no change  Labs/ tests ordered today include:  No orders of the defined types were placed in this encounter.       Karie SchwalbeSigned, Jamian Andujo MD 05/27/2014 7:16 PM    [facility] [address], [place], [place]  [address] Phone: [phone]; Fax: [phone]

## 2014-05-27 NOTE — Patient Instructions (Signed)
Medication Instructions:  TRY TYLENOL 325 MG AS NEEDED   Labwork: BMET/LP/HFP  Testing/Procedures: NONE  Follow-Up: Your physician wants you to follow-up in: 6 months with fasting labs (lp/bmet/hfp)  You will receive a reminder letter in the mail two months in advance. If you don't receive a letter, please call our office to schedule the follow-up appointment.

## 2014-05-28 NOTE — Progress Notes (Signed)
Quick Note:  Please report to patient. The recent labs are stable. Continue same medication and careful diet. LDL is up slightly. Continue to watch diet carefully and keep weight down. ______

## 2014-07-22 ENCOUNTER — Ambulatory Visit (INDEPENDENT_AMBULATORY_CARE_PROVIDER_SITE_OTHER): Payer: Medicare Other | Admitting: *Deleted

## 2014-07-22 DIAGNOSIS — Z5181 Encounter for therapeutic drug level monitoring: Secondary | ICD-10-CM | POA: Diagnosis not present

## 2014-07-22 DIAGNOSIS — I481 Persistent atrial fibrillation: Secondary | ICD-10-CM | POA: Diagnosis not present

## 2014-07-22 DIAGNOSIS — I4891 Unspecified atrial fibrillation: Secondary | ICD-10-CM

## 2014-07-22 DIAGNOSIS — I4819 Other persistent atrial fibrillation: Secondary | ICD-10-CM

## 2014-07-22 LAB — POCT INR: INR: 3.2

## 2014-09-02 ENCOUNTER — Ambulatory Visit (INDEPENDENT_AMBULATORY_CARE_PROVIDER_SITE_OTHER): Payer: Medicare Other | Admitting: *Deleted

## 2014-09-02 DIAGNOSIS — I4891 Unspecified atrial fibrillation: Secondary | ICD-10-CM | POA: Diagnosis not present

## 2014-09-02 DIAGNOSIS — Z5181 Encounter for therapeutic drug level monitoring: Secondary | ICD-10-CM

## 2014-09-02 DIAGNOSIS — I481 Persistent atrial fibrillation: Secondary | ICD-10-CM

## 2014-09-02 DIAGNOSIS — I4819 Other persistent atrial fibrillation: Secondary | ICD-10-CM

## 2014-09-02 LAB — POCT INR: INR: 4.1

## 2014-09-16 ENCOUNTER — Ambulatory Visit (INDEPENDENT_AMBULATORY_CARE_PROVIDER_SITE_OTHER): Payer: Medicare Other | Admitting: *Deleted

## 2014-09-16 DIAGNOSIS — I4891 Unspecified atrial fibrillation: Secondary | ICD-10-CM

## 2014-09-16 DIAGNOSIS — Z5181 Encounter for therapeutic drug level monitoring: Secondary | ICD-10-CM

## 2014-09-16 DIAGNOSIS — I481 Persistent atrial fibrillation: Secondary | ICD-10-CM | POA: Diagnosis not present

## 2014-09-16 DIAGNOSIS — I4819 Other persistent atrial fibrillation: Secondary | ICD-10-CM

## 2014-09-16 LAB — POCT INR: INR: 3.3

## 2014-09-26 ENCOUNTER — Other Ambulatory Visit: Payer: Self-pay | Admitting: Cardiology

## 2014-09-26 NOTE — Telephone Encounter (Signed)
Ellen Barajas at 09/26/2014 12:54 PM     Status: Signed       Expand All Collapse All   Pt calling for Ednah Hammock re medication Atenalol , was told refill expired, has 5 tabs left , needs new refill @ [employer] [phone]        Spoke with patient and advised Atenolol already sent to pharmacy

## 2014-10-02 ENCOUNTER — Ambulatory Visit (INDEPENDENT_AMBULATORY_CARE_PROVIDER_SITE_OTHER): Payer: Medicare Other | Admitting: *Deleted

## 2014-10-02 ENCOUNTER — Encounter (INDEPENDENT_AMBULATORY_CARE_PROVIDER_SITE_OTHER): Payer: Medicare Other | Admitting: Ophthalmology

## 2014-10-02 DIAGNOSIS — I4819 Other persistent atrial fibrillation: Secondary | ICD-10-CM

## 2014-10-02 DIAGNOSIS — H43813 Vitreous degeneration, bilateral: Secondary | ICD-10-CM | POA: Diagnosis not present

## 2014-10-02 DIAGNOSIS — I481 Persistent atrial fibrillation: Secondary | ICD-10-CM

## 2014-10-02 DIAGNOSIS — H3531 Nonexudative age-related macular degeneration: Secondary | ICD-10-CM | POA: Diagnosis not present

## 2014-10-02 DIAGNOSIS — Z5181 Encounter for therapeutic drug level monitoring: Secondary | ICD-10-CM

## 2014-10-02 DIAGNOSIS — I4891 Unspecified atrial fibrillation: Secondary | ICD-10-CM | POA: Diagnosis not present

## 2014-10-02 LAB — POCT INR: INR: 2.3

## 2014-10-14 ENCOUNTER — Ambulatory Visit (INDEPENDENT_AMBULATORY_CARE_PROVIDER_SITE_OTHER): Payer: Medicare Other

## 2014-10-14 DIAGNOSIS — I4891 Unspecified atrial fibrillation: Secondary | ICD-10-CM | POA: Diagnosis not present

## 2014-10-14 DIAGNOSIS — I481 Persistent atrial fibrillation: Secondary | ICD-10-CM | POA: Diagnosis not present

## 2014-10-14 DIAGNOSIS — I4819 Other persistent atrial fibrillation: Secondary | ICD-10-CM

## 2014-10-14 DIAGNOSIS — Z5181 Encounter for therapeutic drug level monitoring: Secondary | ICD-10-CM

## 2014-10-14 LAB — POCT INR: INR: 2.9

## 2014-10-16 ENCOUNTER — Ambulatory Visit (INDEPENDENT_AMBULATORY_CARE_PROVIDER_SITE_OTHER): Payer: Medicare Other | Admitting: Family Medicine

## 2014-10-16 ENCOUNTER — Encounter: Payer: Self-pay | Admitting: Family Medicine

## 2014-10-16 VITALS — BP 110/64 | HR 72 | Temp 97.5°F | Wt 165.8 lb

## 2014-10-16 DIAGNOSIS — Z66 Do not resuscitate: Secondary | ICD-10-CM | POA: Insufficient documentation

## 2014-10-16 DIAGNOSIS — I259 Chronic ischemic heart disease, unspecified: Secondary | ICD-10-CM

## 2014-10-16 DIAGNOSIS — M170 Bilateral primary osteoarthritis of knee: Secondary | ICD-10-CM

## 2014-10-16 DIAGNOSIS — Z0000 Encounter for general adult medical examination without abnormal findings: Secondary | ICD-10-CM | POA: Diagnosis not present

## 2014-10-16 DIAGNOSIS — I2510 Atherosclerotic heart disease of native coronary artery without angina pectoris: Secondary | ICD-10-CM

## 2014-10-16 DIAGNOSIS — Z7189 Other specified counseling: Secondary | ICD-10-CM

## 2014-10-16 DIAGNOSIS — I4819 Other persistent atrial fibrillation: Secondary | ICD-10-CM

## 2014-10-16 DIAGNOSIS — E785 Hyperlipidemia, unspecified: Secondary | ICD-10-CM

## 2014-10-16 DIAGNOSIS — B0229 Other postherpetic nervous system involvement: Secondary | ICD-10-CM

## 2014-10-16 NOTE — Assessment & Plan Note (Signed)
Chronic, stable. Continue coumadin 

## 2014-10-16 NOTE — Assessment & Plan Note (Signed)
Asxs. Sees cards regularly.

## 2014-10-16 NOTE — Assessment & Plan Note (Signed)
Worried about persistent spot L thigh. Initial rash was almost 2.5 yrs ago. planning on getting zostavax when spot resolves.

## 2014-10-16 NOTE — Assessment & Plan Note (Signed)
Advanced planning - working on this. Would want HCPOA to be niece [name]. No prolonged life support. Will bring me copy when she completes this at home. Wants to be DNR - form filled out today.

## 2014-10-16 NOTE — Assessment & Plan Note (Signed)
Mainly affecting R knee. Does not use tylenol.

## 2014-10-16 NOTE — Patient Instructions (Addendum)
Restart red yeast rice twice daily. Nice to see you today, call us with questions. Advanced directive packet provided today. We've filled out DNR (do not resuscitate) form Fill out medicare wellness questionairre and bring back next week. Return as needed or in 1 year for next physical.

## 2014-10-16 NOTE — Assessment & Plan Note (Signed)
No anginal sxs.

## 2014-10-16 NOTE — Progress Notes (Signed)
Pre visit review using our clinic review tool, if applicable. No additional management support is needed unless otherwise documented below in the visit note. 

## 2014-10-16 NOTE — Assessment & Plan Note (Addendum)
I have personally reviewed the Medicare Annual Wellness questionnaire and have noted 1. The patient's medical and social history 2. Their use of alcohol, tobacco or illicit drugs 3. Their current medications and supplements 4. The patient's functional ability including ADL's, fall risks, home safety risks and hearing or visual impairment. Cognitive function has been assessed and addressed as indicated.  5. Diet and physical activity 6. Evidence for depression or mood disorders The patients weight, height, BMI have been recorded in the chart. I have made referrals, counseling and provided education to the patient based on review of the above and I have provided the pt with a written personalized care plan for preventive services. Provider list updated.. See scanned questionairre as needed for further documentation - pt will bring form back next week to review and scan. Reviewed preventative protocols and updated unless pt declined.

## 2014-10-16 NOTE — Assessment & Plan Note (Signed)
She has been off RYR for over a year "did not see an effect". Latest LDL too high - encouraged she restart this BID. Intolerant of other statins, niacin, welchol.

## 2014-10-16 NOTE — Progress Notes (Addendum)
BP 110/64 mmHg  Pulse 72  Temp(Src) 97.5 F (36.4 C) (Oral)  Wt 165 lb 12 oz (75.184 kg)   CC: f/u visit  Subjective:    Patient ID: Ellen Barajas, female    DOB: October 01, 1928, 81 y.o.   MRN: [id number]  HPI: Ellen Barajas is a 81 y.o. female presenting on 10/16/2014 for Follow-up   Not seen here since 06/2012 (established care at that time). Regularly sees Dr Ellen Barajas for atrial fibrillation. H/o ischemic heart disease s/p CABG. H/o afib s/p unsuccessful cardioversion x1 (2011) rate controlled since - on coumadin (followed by cardiology coumadin clinic). On atenolol 1/4 pill daily. Well controlled. HLD - has not tolerated cholesterol meds in past (statins, welchol, nystatin). Complaint with RYR BID.  Would like to discuss zostavax. H/o PHN - 2014. Last visit we provided her with zostavax script but she did not fill. Feels she still has a spot L hip so waiting for this to fully heal.   Some R knee pain presumed osteoarthritis.   Lab Results  Component Value Date   INR 2.9 10/14/2014   INR 2.3 10/02/2014   INR 3.3 09/16/2014     Preventative: Breast exam - with Dr Ellen Barajas  Colon cancer screening - age out. No blood in stool or BM changes Declines flu shot  Td 2007  Pneumonia - declines  zostavax - see above Advanced planning - working on this. Would want HCPOA to be niece Ellen Barajas. No prolonged life support. Will bring me copy when she completes this at home. Wants to be DNR - form filled out today.  Lives and cares for disabled husband (stroke) Occupation: retired, was LPN  Relevant past medical, surgical, family and social history reviewed and updated as indicated. Interim medical history since our last visit reviewed. Allergies and medications reviewed and updated. Current Outpatient Prescriptions on File Prior to Visit  Medication Sig  . aspirin 81 MG tablet Take 81 mg by mouth daily.    Marland Kitchen atenolol (TENORMIN) 25 MG tablet Take by mouth daily. Take 1/4 tablet by  mouth daily  . Cholecalciferol (VITAMIN D-3 PO) Take 10,000 Units by mouth daily.   . fish oil-omega-3 fatty acids 1000 MG capsule Take 1 capsule by mouth 2 (two) times daily.   . Multiple Vitamins-Minerals (EYE VITAMINS) TABS Take 4 tablets by mouth daily. Take 4 daily  . nitroGLYCERIN (NITROSTAT) 0.4 MG SL tablet Place 0.4 mg under the tongue every 5 (five) minutes as needed for chest pain (for chest pain).  . Red Yeast Rice Extract (RED YEAST RICE PO) Take by mouth 2 (two) times daily.    Marland Kitchen warfarin (COUMADIN) 5 MG tablet Take as directed by anticoagulation clinic  . acetaminophen (TYLENOL) 325 MG tablet Take 325 mg by mouth every 6 (six) hours as needed.   No current facility-administered medications on file prior to visit.    Review of Systems  Constitutional: Negative for fever, chills, activity change, appetite change, fatigue and unexpected weight change.  HENT: Negative for hearing loss.   Eyes: Negative for visual disturbance.  Respiratory: Negative for cough, chest tightness, shortness of breath and wheezing.   Cardiovascular: Negative for chest pain, palpitations and leg swelling.  Gastrointestinal: Negative for nausea, vomiting, abdominal pain, diarrhea, constipation, blood in stool and abdominal distention.  Genitourinary: Negative for hematuria and difficulty urinating.  Musculoskeletal: Negative for myalgias, arthralgias and neck pain.  Skin: Negative for rash.  Neurological: Negative for dizziness, seizures, syncope and headaches.  Hematological:  Negative for adenopathy. Does not bruise/bleed easily.  Psychiatric/Behavioral: Negative for dysphoric mood. The patient is not nervous/anxious.    Per HPI unless specifically indicated above     Objective:    BP 110/64 mmHg  Pulse 72  Temp(Src) 97.5 F (36.4 C) (Oral)  Wt 165 lb 12 oz (75.184 kg)  Wt Readings from Last 3 Encounters:  10/16/14 165 lb 12 oz (75.184 kg)  05/27/14 163 lb (73.936 kg)  08/19/13 161 lb 12.8  oz (73.392 kg)    Physical Exam  Constitutional: She is oriented to person, place, and time. She appears well-developed and well-nourished. No distress.  Walks with cane  HENT:  Head: Normocephalic and atraumatic.  Right Ear: Hearing, tympanic membrane, external ear and ear canal normal.  Left Ear: Hearing, tympanic membrane, external ear and ear canal normal.  Nose: Nose normal.  Mouth/Throat: Uvula is midline, oropharynx is clear and moist and mucous membranes are normal. No oropharyngeal exudate, posterior oropharyngeal edema or posterior oropharyngeal erythema.  Eyes: Conjunctivae and EOM are normal. Pupils are equal, round, and reactive to light. No scleral icterus.  Neck: Normal range of motion. Neck supple. Carotid bruit is not present. No thyromegaly present.  Cardiovascular: Normal rate, normal heart sounds and intact distal pulses.  An irregularly irregular rhythm present.  No murmur heard. Pulses:      Radial pulses are 2+ on the right side, and 2+ on the left side.  Pulmonary/Chest: Effort normal and breath sounds normal. No respiratory distress. She has no wheezes. She has no rales.  Abdominal: Soft. Bowel sounds are normal. She exhibits no distension and no mass. There is no tenderness. There is no rebound and no guarding.  Musculoskeletal: Normal range of motion. She exhibits edema (tr).  Lymphadenopathy:    She has no cervical adenopathy.  Neurological: She is alert and oriented to person, place, and time.  CN grossly intact, station and gait intact  Skin: Skin is warm and dry. No rash noted.  Psychiatric: She has a normal mood and affect. Her behavior is normal. Judgment and thought content normal.  Nursing note and vitals reviewed.  Results for orders placed or performed in visit on 10/14/14  POCT INR  Result Value Ref Range   INR 2.9       Assessment & Plan:   Problem List Items Addressed This Visit    Post herpetic neuralgia    Worried about persistent spot L  thigh. Initial rash was almost 2.5 yrs ago. planning on getting zostavax when spot resolves.      Persistent atrial fibrillation (HCC)    Chronic, stable. Continue coumadin.      Osteoarthritis    Mainly affecting R knee. Does not use tylenol.      Medicare annual wellness visit, subsequent - Primary    I have personally reviewed the Medicare Annual Wellness questionnaire and have noted 1. The patient's medical and social history 2. Their use of alcohol, tobacco or illicit drugs 3. Their current medications and supplements 4. The patient's functional ability including ADL's, fall risks, home safety risks and hearing or visual impairment. Cognitive function has been assessed and addressed as indicated.  5. Diet and physical activity 6. Evidence for depression or mood disorders The patients weight, height, BMI have been recorded in the chart. I have made referrals, counseling and provided education to the patient based on review of the above and I have provided the pt with a written personalized care plan for preventive services. Provider  list updated.. See scanned questionairre as needed for further documentation - pt will bring form back next week to review and scan. Reviewed preventative protocols and updated unless pt declined.       IHD (ischemic heart disease)    No anginal sxs.      Dyslipidemia    She has been off RYR for over a year "did not see an effect". Latest LDL too high - encouraged she restart this BID. Intolerant of other statins, niacin, welchol.      DNR (do not resuscitate)   CAD (coronary artery disease)    Asxs. Sees cards regularly.      Advanced care planning/counseling discussion    Advanced planning - working on this. Would want HCPOA to be niece Ellen Barajas. No prolonged life support. Will bring me copy when she completes this at home. Wants to be DNR - form filled out today.          Follow up plan: Return in about 1 year (around  10/16/2015), or as needed, for medicare wellness visit.

## 2014-11-16 NOTE — Addendum Note (Signed)
Addended by: Eustaquio BoydenGUTIERREZ, Rowe Warman on: 11/16/2014 01:19 PM   Modules accepted: Level of Service

## 2014-11-25 ENCOUNTER — Ambulatory Visit (INDEPENDENT_AMBULATORY_CARE_PROVIDER_SITE_OTHER): Payer: Medicare Other | Admitting: *Deleted

## 2014-11-25 DIAGNOSIS — I481 Persistent atrial fibrillation: Secondary | ICD-10-CM | POA: Diagnosis not present

## 2014-11-25 DIAGNOSIS — Z5181 Encounter for therapeutic drug level monitoring: Secondary | ICD-10-CM

## 2014-11-25 DIAGNOSIS — I4819 Other persistent atrial fibrillation: Secondary | ICD-10-CM

## 2014-11-25 DIAGNOSIS — I4891 Unspecified atrial fibrillation: Secondary | ICD-10-CM | POA: Diagnosis not present

## 2014-11-25 LAB — POCT INR: INR: 2.4

## 2014-12-05 ENCOUNTER — Ambulatory Visit (INDEPENDENT_AMBULATORY_CARE_PROVIDER_SITE_OTHER): Payer: Medicare Other | Admitting: Cardiology

## 2014-12-05 ENCOUNTER — Encounter: Payer: Self-pay | Admitting: Cardiology

## 2014-12-05 VITALS — BP 112/70 | HR 88 | Ht 63.0 in

## 2014-12-05 DIAGNOSIS — I259 Chronic ischemic heart disease, unspecified: Secondary | ICD-10-CM

## 2014-12-05 DIAGNOSIS — I481 Persistent atrial fibrillation: Secondary | ICD-10-CM | POA: Diagnosis not present

## 2014-12-05 DIAGNOSIS — I4819 Other persistent atrial fibrillation: Secondary | ICD-10-CM

## 2014-12-05 DIAGNOSIS — E7800 Pure hypercholesterolemia, unspecified: Secondary | ICD-10-CM | POA: Diagnosis not present

## 2014-12-05 DIAGNOSIS — Z951 Presence of aortocoronary bypass graft: Secondary | ICD-10-CM

## 2014-12-05 LAB — HEPATIC FUNCTION PANEL
ALT: 24 U/L (ref 6–29)
AST: 26 U/L (ref 10–35)
Albumin: 4.3 g/dL (ref 3.6–5.1)
Alkaline Phosphatase: 66 U/L (ref 33–130)
BILIRUBIN DIRECT: 0.1 mg/dL (ref <=0.2)
BILIRUBIN INDIRECT: 0.6 mg/dL (ref 0.2–1.2)
BILIRUBIN TOTAL: 0.7 mg/dL (ref 0.2–1.2)
Total Protein: 6.9 g/dL (ref 6.1–8.1)

## 2014-12-05 LAB — BASIC METABOLIC PANEL
BUN: 19 mg/dL (ref 7–25)
CO2: 24 mmol/L (ref 20–31)
Calcium: 9.3 mg/dL (ref 8.6–10.4)
Chloride: 104 mmol/L (ref 98–110)
Creat: 0.9 mg/dL — ABNORMAL HIGH (ref 0.60–0.88)
GLUCOSE: 86 mg/dL (ref 65–99)
POTASSIUM: 4 mmol/L (ref 3.5–5.3)
SODIUM: 140 mmol/L (ref 135–146)

## 2014-12-05 LAB — LIPID PANEL
CHOL/HDL RATIO: 3.8 ratio (ref <=5.0)
CHOLESTEROL: 215 mg/dL — AB (ref 125–200)
HDL: 56 mg/dL (ref >=46)
LDL Cholesterol: 133 mg/dL — ABNORMAL HIGH (ref <130)
Triglycerides: 131 mg/dL (ref <150)
VLDL: 26 mg/dL (ref <30)

## 2014-12-05 NOTE — Progress Notes (Signed)
Cardiology Office Note   Date:  12/05/2014   ID:  Otis Dialsrma D Mcandrew, DOB 09/29/1928, MRN [id number]  PCP:  Eustaquio BoydenJavier Gutierrez, MD  Cardiologist: Cassell Clementhomas Aimie Wagman MD  No chief complaint on file.     History of Present Illness: Ellen Barajas is a 81 y.o. female who presents for scheduled follow-up visit  This pleasant 81 year old woman is seen for a scheduled four-month followup office visit. She has a history of ischemic heart disease. She has had previous CABG. [name] been in chronic atrial fibrillation. She underwent cardioversion on 10/23/09 but 1 week later was back in atrial fibrillation. We have made no further attempts at cardioversion. She is essentially asymptomatic with her rhythm. She has a history of hypercholesterolemia and is on over-the-counter preparations. We are checking lipids today. Since last visit she has had no new cardiac symptoms. She remains on long-term Coumadin. Her post herpetic neuralgia pain is improved. She has osteoarthritis of the knees. She does not take anything for it. I suggested that she try some generic Tylenol Since her last visit she has not been experiencing any TIA or stroke symptoms.  No increased exertional dyspnea.  No chest pain.  Past Medical History  Diagnosis Date  . AF (atrial fibrillation) (HCC)     coumadin  . IHD (ischemic heart disease)   . Dyslipidemia   . Coronary artery disease 2002    s/p CABG  . Post herpetic neuralgia 02/2012  . Arthritis   . Diverticulosis   . History of chicken pox     Past Surgical History  Procedure Laterality Date  . Coronary artery bypass graft  2002    x 4  . Cataract extraction  2013    bilateral  . Breast lumpectomy      left     Current Outpatient Prescriptions  Medication Sig Dispense Refill  . acetaminophen (TYLENOL) 325 MG tablet Take 325 mg by mouth every 6 (six) hours as needed (pain).     Marland Kitchen. aspirin 81 MG tablet Take 81 mg by mouth daily.      Marland Kitchen. atenolol (TENORMIN) 25 MG tablet Take  1/4 tablet by mouth daily    . Cholecalciferol (VITAMIN D-3 PO) Take 10,000 Units by mouth daily.     . fish oil-omega-3 fatty acids 1000 MG capsule Take 1 capsule by mouth 2 (two) times daily.     . Multiple Vitamins-Minerals (EYE VITAMINS) TABS Take 4 tablets by mouth daily.     . nitroGLYCERIN (NITROSTAT) 0.4 MG SL tablet Place 0.4 mg under the tongue every 5 (five) minutes as needed for chest pain (for chest pain).    . psyllium (METAMUCIL) 58.6 % packet Take 1 packet by mouth daily as needed (constipation).     . Red Yeast Rice Extract (RED YEAST RICE PO) Take by mouth 2 (two) times daily.      Marland Kitchen. warfarin (COUMADIN) 5 MG tablet Take as directed by anticoagulation clinic 90 tablet 1   No current facility-administered medications for this visit.    Allergies:   Crestor; Lipitor; Niaspan; Other; Pravastatin; Ramipril; Welchol; and Zetia    Social History:  The patient  reports that she has never smoked. She has never used smokeless tobacco. She reports that she does not drink alcohol or use illicit drugs.   Family History:  The patient's family history includes CAD (age of onset: 8874) in her father; CAD (age of onset: 1390) in her mother.    ROS:  Please see  the history of present illness.   Otherwise, review of systems are positive for none.   All other systems are reviewed and negative.    PHYSICAL EXAM: VS:  BP 112/70 mmHg  Pulse 88  Ht  (1.6 m) , BMI There is no weight on file to calculate BMI. GEN: Well nourished, well developed, in no acute distress HEENT: normal Neck: no JVD, carotid bruits, or masses Cardiac: Irregularly irregular, no murmurs, rubs, or gallops,no edema  Respiratory:  clear to auscultation bilaterally, normal work of breathing GI: soft, nontender, nondistended, + BS MS: no deformity or atrophy Skin: warm and dry, no rash Neuro:  Strength and sensation are intact Psych: euthymic mood, full affect   EKG:  EKG is ordered today. The ekg ordered today  demonstrates atrial fibrillation with controlled ventricular rate of 88 bpm.  Nonspecific ST-T wave changes.   Recent Labs: 05/27/2014: ALT 17; BUN 12; Creatinine, Ser 0.87; Potassium 4.4; Sodium 140    Lipid Panel    Component Value Date/Time   CHOL 209* 05/27/2014 0918   TRIG 111.0 05/27/2014 0918   HDL 55.40 05/27/2014 0918   CHOLHDL 4 05/27/2014 0918   VLDL 22.2 05/27/2014 0918   LDLCALC 131* 05/27/2014 0918   LDLDIRECT 147.5 12/17/2012 0920      Wt Readings from Last 3 Encounters:  10/16/14 165 lb 12 oz (75.184 kg)  05/27/14 163 lb (73.936 kg)  08/19/13 161 lb 12.8 oz (73.392 kg)        ASSESSMENT AND PLAN:  1. Ischemic heart disease status post CABG 2. permanent atrial fibrillation 3. Hypercholesterolemia 4. Post herpetic neuralgia, improved.  Plan: Await results of today's labs. Recheck in 6 months for office visit lipid panel hepatic function panel and basal metabolic panel.  After my retirement she will be followed by Dr.Nahser   Current medicines are reviewed at length with the patient today.  The patient does not have concerns regarding medicines.  The following changes have been made:  no change  Labs/ tests ordered today include:   Orders Placed This Encounter  Procedures  . Lipid panel  . Hepatic function panel  . Basic metabolic panel  . EKG 12-Lead     Signed, Cassell Clement MD 12/05/2014 1:35 PM    [facility] [address] [place], [place], [place]  [address] Phone: [phone]; Fax: [phone]

## 2014-12-05 NOTE — Patient Instructions (Signed)
Medication Instructions:  Your physician recommends that you continue on your current medications as directed. Please refer to the Current Medication list given to you today.  Labwork: LP/BMET/HFP  Testing/Procedures: NONE  Follow-Up: Your physician wants you to follow-up in: 6 months with fasting labs (lp/bmet/hfp) WITH DR NAHSER  You will receive a reminder letter in the mail two months in advance. If you don't receive a letter, please call our office to schedule the follow-up appointment.  If you need a refill on your cardiac medications before your next appointment, please call your pharmacy.  

## 2014-12-07 NOTE — Progress Notes (Signed)
Quick Note:  Please report to patient. The recent labs are stable. Continue same medication and careful diet. LDL still high. Watch diet carefully. ______

## 2015-02-17 ENCOUNTER — Telehealth: Payer: Self-pay | Admitting: Cardiology

## 2015-02-17 NOTE — Telephone Encounter (Signed)
Spoke with pt and she states she has had problems with her well and has been unable to get out to keep her appointment . She has an appt for Monday and we will refill coumadin at that time. She states she has enough coumadin to last until that appt Has not been seen in coumadin clinic since November 2016

## 2015-02-17 NOTE — Telephone Encounter (Signed)
°*  STAT* If patient is at the pharmacy, call can be transferred to refill team.   1. Which medications need to be refilled? (please list name of each medication and dose if known) warfarin    2. Which pharmacy/location (including street and city if local pharmacy) is medication to be sent to? [facility] 3. Do they need a 30 day or 90 day supply? 90

## 2015-02-23 ENCOUNTER — Ambulatory Visit (INDEPENDENT_AMBULATORY_CARE_PROVIDER_SITE_OTHER): Payer: Medicare Other | Admitting: *Deleted

## 2015-02-23 DIAGNOSIS — I481 Persistent atrial fibrillation: Secondary | ICD-10-CM | POA: Diagnosis not present

## 2015-02-23 DIAGNOSIS — I4891 Unspecified atrial fibrillation: Secondary | ICD-10-CM

## 2015-02-23 DIAGNOSIS — Z5181 Encounter for therapeutic drug level monitoring: Secondary | ICD-10-CM

## 2015-02-23 DIAGNOSIS — I4819 Other persistent atrial fibrillation: Secondary | ICD-10-CM

## 2015-02-23 LAB — POCT INR: INR: 2.7

## 2015-02-23 MED ORDER — WARFARIN SODIUM 5 MG PO TABS: ORAL_TABLET | ORAL | Status: DC

## 2015-03-17 DIAGNOSIS — Z9842 Cataract extraction status, left eye: Secondary | ICD-10-CM | POA: Diagnosis not present

## 2015-03-17 DIAGNOSIS — Z9841 Cataract extraction status, right eye: Secondary | ICD-10-CM | POA: Diagnosis not present

## 2015-03-17 DIAGNOSIS — H353132 Nonexudative age-related macular degeneration, bilateral, intermediate dry stage: Secondary | ICD-10-CM | POA: Diagnosis not present

## 2015-04-02 ENCOUNTER — Ambulatory Visit (INDEPENDENT_AMBULATORY_CARE_PROVIDER_SITE_OTHER): Payer: Medicare Other | Admitting: Ophthalmology

## 2015-04-02 DIAGNOSIS — H353211 Exudative age-related macular degeneration, right eye, with active choroidal neovascularization: Secondary | ICD-10-CM

## 2015-04-02 DIAGNOSIS — H33301 Unspecified retinal break, right eye: Secondary | ICD-10-CM | POA: Diagnosis not present

## 2015-04-02 DIAGNOSIS — H43813 Vitreous degeneration, bilateral: Secondary | ICD-10-CM

## 2015-04-02 DIAGNOSIS — H353122 Nonexudative age-related macular degeneration, left eye, intermediate dry stage: Secondary | ICD-10-CM | POA: Diagnosis not present

## 2015-04-06 ENCOUNTER — Ambulatory Visit (INDEPENDENT_AMBULATORY_CARE_PROVIDER_SITE_OTHER): Payer: Medicare Other | Admitting: *Deleted

## 2015-04-06 DIAGNOSIS — I4891 Unspecified atrial fibrillation: Secondary | ICD-10-CM | POA: Diagnosis not present

## 2015-04-06 DIAGNOSIS — I481 Persistent atrial fibrillation: Secondary | ICD-10-CM

## 2015-04-06 DIAGNOSIS — Z5181 Encounter for therapeutic drug level monitoring: Secondary | ICD-10-CM

## 2015-04-06 DIAGNOSIS — I4819 Other persistent atrial fibrillation: Secondary | ICD-10-CM

## 2015-04-06 LAB — POCT INR: INR: 2.5

## 2015-05-18 ENCOUNTER — Other Ambulatory Visit: Payer: Self-pay

## 2015-05-18 ENCOUNTER — Ambulatory Visit (INDEPENDENT_AMBULATORY_CARE_PROVIDER_SITE_OTHER): Payer: Medicare Other

## 2015-05-18 DIAGNOSIS — I4891 Unspecified atrial fibrillation: Secondary | ICD-10-CM | POA: Diagnosis not present

## 2015-05-18 DIAGNOSIS — Z5181 Encounter for therapeutic drug level monitoring: Secondary | ICD-10-CM | POA: Diagnosis not present

## 2015-05-18 DIAGNOSIS — I4819 Other persistent atrial fibrillation: Secondary | ICD-10-CM

## 2015-05-18 DIAGNOSIS — I481 Persistent atrial fibrillation: Secondary | ICD-10-CM | POA: Diagnosis not present

## 2015-05-18 LAB — POCT INR: INR: 3.3

## 2015-05-18 MED ORDER — WARFARIN SODIUM 5 MG PO TABS: ORAL_TABLET | ORAL | Status: DC

## 2015-06-23 ENCOUNTER — Ambulatory Visit (INDEPENDENT_AMBULATORY_CARE_PROVIDER_SITE_OTHER): Payer: Medicare Other | Admitting: Pharmacist

## 2015-06-23 DIAGNOSIS — I4891 Unspecified atrial fibrillation: Secondary | ICD-10-CM

## 2015-06-23 DIAGNOSIS — I481 Persistent atrial fibrillation: Secondary | ICD-10-CM | POA: Diagnosis not present

## 2015-06-23 DIAGNOSIS — Z5181 Encounter for therapeutic drug level monitoring: Secondary | ICD-10-CM | POA: Diagnosis not present

## 2015-06-23 DIAGNOSIS — I4819 Other persistent atrial fibrillation: Secondary | ICD-10-CM

## 2015-06-23 LAB — POCT INR: INR: 3.7

## 2015-07-14 ENCOUNTER — Ambulatory Visit (INDEPENDENT_AMBULATORY_CARE_PROVIDER_SITE_OTHER): Payer: Medicare Other | Admitting: *Deleted

## 2015-07-14 DIAGNOSIS — Z5181 Encounter for therapeutic drug level monitoring: Secondary | ICD-10-CM | POA: Diagnosis not present

## 2015-07-14 DIAGNOSIS — I481 Persistent atrial fibrillation: Secondary | ICD-10-CM | POA: Diagnosis not present

## 2015-07-14 DIAGNOSIS — I4891 Unspecified atrial fibrillation: Secondary | ICD-10-CM | POA: Diagnosis not present

## 2015-07-14 DIAGNOSIS — I4819 Other persistent atrial fibrillation: Secondary | ICD-10-CM

## 2015-07-14 LAB — POCT INR: INR: 2.7

## 2015-08-04 ENCOUNTER — Telehealth: Payer: Self-pay | Admitting: Cardiovascular Disease

## 2015-08-04 NOTE — Telephone Encounter (Signed)
Pt needs refill of Atenolol @ [facility] -pt requesting #65 tablets due to having to cut them in fourths and that tablet is larger

## 2015-08-05 ENCOUNTER — Other Ambulatory Visit: Payer: Self-pay

## 2015-08-05 MED ORDER — ATENOLOL 25 MG PO TABS: ORAL_TABLET | ORAL | Status: DC

## 2015-08-12 ENCOUNTER — Ambulatory Visit (INDEPENDENT_AMBULATORY_CARE_PROVIDER_SITE_OTHER): Payer: Medicare Other | Admitting: *Deleted

## 2015-08-12 ENCOUNTER — Encounter (INDEPENDENT_AMBULATORY_CARE_PROVIDER_SITE_OTHER): Payer: Self-pay

## 2015-08-12 DIAGNOSIS — I4891 Unspecified atrial fibrillation: Secondary | ICD-10-CM | POA: Diagnosis not present

## 2015-08-12 DIAGNOSIS — Z5181 Encounter for therapeutic drug level monitoring: Secondary | ICD-10-CM | POA: Diagnosis not present

## 2015-08-12 DIAGNOSIS — I481 Persistent atrial fibrillation: Secondary | ICD-10-CM | POA: Diagnosis not present

## 2015-08-12 DIAGNOSIS — I4819 Other persistent atrial fibrillation: Secondary | ICD-10-CM

## 2015-08-12 LAB — POCT INR: INR: 2.7

## 2015-09-09 ENCOUNTER — Ambulatory Visit (INDEPENDENT_AMBULATORY_CARE_PROVIDER_SITE_OTHER): Payer: Medicare Other | Admitting: *Deleted

## 2015-09-09 DIAGNOSIS — I481 Persistent atrial fibrillation: Secondary | ICD-10-CM | POA: Diagnosis not present

## 2015-09-09 DIAGNOSIS — I4819 Other persistent atrial fibrillation: Secondary | ICD-10-CM

## 2015-09-09 DIAGNOSIS — Z5181 Encounter for therapeutic drug level monitoring: Secondary | ICD-10-CM

## 2015-09-09 DIAGNOSIS — I4891 Unspecified atrial fibrillation: Secondary | ICD-10-CM

## 2015-09-09 LAB — POCT INR: INR: 3.2

## 2015-09-22 ENCOUNTER — Telehealth: Payer: Self-pay | Admitting: Cardiovascular Disease

## 2015-09-22 MED ORDER — ATENOLOL 25 MG PO TABS: ORAL_TABLET | ORAL | 1 refills | Status: DC

## 2015-09-22 NOTE — Telephone Encounter (Signed)
I spoke with Ellen Barajas and gave her instructions from Dr. Elease HashimotoNahser. She reports she thinks she has enough to last until mail order can obtain medication. [facility] Will send in. She is aware of appt with Dr. Melburn PopperNasher on 12/07/15

## 2015-09-22 NOTE — Telephone Encounter (Signed)
Will route to Dr. Elease HashimotoNahser for review/recommendations.

## 2015-09-22 NOTE — Telephone Encounter (Signed)
She takes 1/4 of a 25 mg tablet  6.25 mg a day I dont think that she needs this .   I would be difficult to dose metoprolol at this level She may hold the beta blocker for now Will address when I seen her in the office

## 2015-09-22 NOTE — Telephone Encounter (Signed)
New message       Pt states she need a refill on her atenolol but the drug store said the manufacturer is on back order.  She has enough to last for 16 days.  Is there anything else she can get in case she runs out of medication?

## 2015-10-01 ENCOUNTER — Encounter (INDEPENDENT_AMBULATORY_CARE_PROVIDER_SITE_OTHER): Payer: Medicare Other | Admitting: Ophthalmology

## 2015-10-01 DIAGNOSIS — H353122 Nonexudative age-related macular degeneration, left eye, intermediate dry stage: Secondary | ICD-10-CM | POA: Diagnosis not present

## 2015-10-01 DIAGNOSIS — H43813 Vitreous degeneration, bilateral: Secondary | ICD-10-CM

## 2015-10-01 DIAGNOSIS — H353211 Exudative age-related macular degeneration, right eye, with active choroidal neovascularization: Secondary | ICD-10-CM

## 2015-10-07 ENCOUNTER — Ambulatory Visit (INDEPENDENT_AMBULATORY_CARE_PROVIDER_SITE_OTHER): Payer: Medicare Other | Admitting: *Deleted

## 2015-10-07 DIAGNOSIS — I4819 Other persistent atrial fibrillation: Secondary | ICD-10-CM

## 2015-10-07 DIAGNOSIS — I4891 Unspecified atrial fibrillation: Secondary | ICD-10-CM

## 2015-10-07 DIAGNOSIS — I481 Persistent atrial fibrillation: Secondary | ICD-10-CM

## 2015-10-07 DIAGNOSIS — Z5181 Encounter for therapeutic drug level monitoring: Secondary | ICD-10-CM

## 2015-10-07 LAB — POCT INR: INR: 2.7

## 2015-10-08 ENCOUNTER — Telehealth: Payer: Self-pay | Admitting: Nurse Practitioner

## 2015-10-08 MED ORDER — ATENOLOL 25 MG PO TABS: ORAL_TABLET | ORAL | 6 refills | Status: DC

## 2015-10-08 NOTE — Telephone Encounter (Signed)
Spoke with patient the paper she brought into the office regarding the Atenolol 25 mg.  I advised that we do not know when the medication will be available and per Dr. Elease HashimotoNahser she may d/c the 1/4 Atenolol 25 mg that she currently takes. She does not monitor heart rate or BP at home.  She states she called local [facility] and [facility] location [address] has Aten[address]ol.  I advised I will send a Rx to that [facility].  I advised her to call back with questions or concerns.

## 2015-11-04 ENCOUNTER — Ambulatory Visit (INDEPENDENT_AMBULATORY_CARE_PROVIDER_SITE_OTHER): Payer: Medicare Other | Admitting: *Deleted

## 2015-11-04 DIAGNOSIS — I4891 Unspecified atrial fibrillation: Secondary | ICD-10-CM | POA: Diagnosis not present

## 2015-11-04 DIAGNOSIS — I481 Persistent atrial fibrillation: Secondary | ICD-10-CM | POA: Diagnosis not present

## 2015-11-04 DIAGNOSIS — I4819 Other persistent atrial fibrillation: Secondary | ICD-10-CM

## 2015-11-04 DIAGNOSIS — Z5181 Encounter for therapeutic drug level monitoring: Secondary | ICD-10-CM

## 2015-11-04 LAB — POCT INR: INR: 2.1

## 2015-12-07 ENCOUNTER — Encounter: Payer: Self-pay | Admitting: Cardiovascular Disease

## 2015-12-07 ENCOUNTER — Ambulatory Visit (INDEPENDENT_AMBULATORY_CARE_PROVIDER_SITE_OTHER): Payer: Medicare Other | Admitting: *Deleted

## 2015-12-07 ENCOUNTER — Ambulatory Visit (INDEPENDENT_AMBULATORY_CARE_PROVIDER_SITE_OTHER): Payer: Medicare Other | Admitting: Cardiovascular Disease

## 2015-12-07 VITALS — BP 122/78 | HR 86 | Ht 63.0 in | Wt 159.8 lb

## 2015-12-07 DIAGNOSIS — I4891 Unspecified atrial fibrillation: Secondary | ICD-10-CM

## 2015-12-07 DIAGNOSIS — I2510 Atherosclerotic heart disease of native coronary artery without angina pectoris: Secondary | ICD-10-CM | POA: Diagnosis not present

## 2015-12-07 DIAGNOSIS — I481 Persistent atrial fibrillation: Secondary | ICD-10-CM

## 2015-12-07 DIAGNOSIS — Z5181 Encounter for therapeutic drug level monitoring: Secondary | ICD-10-CM

## 2015-12-07 DIAGNOSIS — I4819 Other persistent atrial fibrillation: Secondary | ICD-10-CM

## 2015-12-07 LAB — POCT INR: INR: 1.6

## 2015-12-07 NOTE — Progress Notes (Signed)
Cardiology Office Note   Date:  12/07/2015   ID:  Ellen Barajas, DOB 12/18/1928, MRN [id number]  PCP:  Ellen BoydenJavier Gutierrez, MD  Cardiologist: Ellen Clementhomas Brackbill MD --->  Ellen Barajas   Chief Complaint  Patient presents with  . Follow-up    Atrial fib    Problem list 1. Coronary artery disease-status post coronary artery bypass grafting 2. Atrial fibrillation 3. Hyperlipidemia     Previous notes from Ellen CordsBrackbill   Ellen Barajas is a 82 y.o. female who presents for scheduled follow-up visit  This pleasant 82 year old woman is seen for a scheduled four-month followup office visit. She has a history of ischemic heart disease. She has had previous CABG. [name] been in chronic atrial fibrillation. She underwent cardioversion on 10/23/09 but 1 week later was back in atrial fibrillation. We have made no further attempts at cardioversion. She is essentially asymptomatic with her rhythm. She has a history of hypercholesterolemia and is on over-the-counter preparations. We are checking lipids today. Since last visit she has had no new cardiac symptoms. She remains on long-term Coumadin. Her post herpetic neuralgia pain is improved. She has osteoarthritis of the knees. She does not take anything for it. I suggested that she try some generic Tylenol Since her last visit she has not been experiencing any TIA or stroke symptoms.  No increased exertional dyspnea.  No chest pain.  Nov. 13, 2017:  Ellen Barajas is seen today for initial visit .   Seen with husband, Ellen MostCharles.   Ellen Barajas's older sister.  No CP or dyspnea . Is in chronic atrial fib.   She is completely asymptomatic.     Past Medical History:  Diagnosis Date  . AF (atrial fibrillation) (HCC)    coumadin  . Arthritis   . Coronary artery disease 2002   s/p CABG  . Diverticulosis   . Dyslipidemia   . History of chicken pox   . IHD (ischemic heart disease)   . Post herpetic neuralgia 02/2012    Past Surgical History:  Procedure Laterality Date  .  BREAST LUMPECTOMY     left  . CATARACT EXTRACTION  2013   bilateral  . CORONARY ARTERY BYPASS GRAFT  2002   x 4     Current Outpatient Prescriptions  Medication Sig Dispense Refill  . acetaminophen (TYLENOL) 325 MG tablet Take 325 mg by mouth every 6 (six) hours as needed (pain).     Marland Kitchen. aspirin 81 MG tablet Take 81 mg by mouth daily.      Marland Kitchen. atenolol (TENORMIN) 25 MG tablet Take 1/4 tablet by mouth daily 25 tablet 6  . Cholecalciferol (VITAMIN D-3 PO) Take 10,000 Units by mouth daily.     . fish oil-omega-3 fatty acids 1000 MG capsule Take 1 capsule by mouth 2 (two) times daily.     . Multiple Vitamins-Minerals (EYE VITAMINS) TABS Take 4 tablets by mouth daily.     . nitroGLYCERIN (NITROSTAT) 0.4 MG SL tablet Place 0.4 mg under the tongue every 5 (five) minutes as needed for chest pain (for chest pain).    . psyllium (METAMUCIL) 58.6 % packet Take 1 packet by mouth daily as needed (constipation).     . Red Yeast Rice Extract (RED YEAST RICE PO) Take by mouth 2 (two) times daily.      Marland Kitchen. warfarin (COUMADIN) 5 MG tablet Take as directed by anticoagulation clinic 90 tablet 1   No current facility-administered medications for this visit.     Allergies:  Crestor [rosuvastatin calcium]; Lipitor [atorvastatin calcium]; Niaspan [niacin er]; Other; Pravastatin; Ramipril [ramipril]; Welchol [colesevelam hcl]; and Zetia [ezetimibe]    Social History:  The patient  reports that she has never smoked. She has never used smokeless tobacco. She reports that she does not drink alcohol or use drugs.   Family History:  The patient's family history includes CAD (age of onset: 4374) in her father; CAD (age of onset: 8390) in her mother.    ROS:  Please see the history of present illness.   Otherwise, review of systems are positive for none.   All other systems are reviewed and negative.    PHYSICAL EXAM: VS:  BP 122/78   Pulse 86   Ht 5\' 3"  (1.6 m)   Wt 159 lb 12.8 oz (72.5 kg)   SpO2 97%   BMI 28.31  kg/m  , BMI Body mass index is 28.31 kg/m. GEN: Well nourished, well developed, in no acute distress  HEENT: normal  Neck: no JVD, carotid bruits, or masses Cardiac: Irregularly irregular, soft systolic murmur , rubs, or gallops, trace - mild  edema  Respiratory:  clear to auscultation bilaterally, normal work of breathing GI: soft, nontender, nondistended, + BS MS: no deformity or atrophy  Skin: warm and dry, no rash Neuro:  Strength and sensation are intact Psych: euthymic mood, full affect   EKG:  EKG is ordered today. The ekg ordered today demonstrates atrial fibrillation with controlled ventricular rate of 88 bpm.  Nonspecific ST-T wave changes.   Recent Labs: No results found for requested labs within last 8760 hours.    Lipid Panel    Component Value Date/Time   CHOL 215 (H) 12/05/2014 1019   TRIG 131 12/05/2014 1019   HDL 56 12/05/2014 1019   CHOLHDL 3.8 12/05/2014 1019   VLDL 26 12/05/2014 1019   LDLCALC 133 (H) 12/05/2014 1019   LDLDIRECT 147.5 12/17/2012 0920      Wt Readings from Last 3 Encounters:  12/07/15 159 lb 12.8 oz (72.5 kg)  10/16/14 165 lb 12 oz (75.2 kg)  05/27/14 163 lb (73.9 kg)        ASSESSMENT AND PLAN:  1. Ischemic heart disease status post CABG - she's doing very well. [name] not having any episodes of angina. Continue current medications.  2. permanent atrial fibrillation- basically asymptomatic. Continue anticoagulation with Coumadin.  3. Hypercholesterolemia Will check labs today   4. Post herpetic neuralgia, improved.   Ellen MissPhilip Nahser, MD  12/07/2015 2:42 PM    [facility] [address],  [address] [place], [place]  [address] Pager [phone] Phone: [phone]; Fax: [phone]

## 2015-12-07 NOTE — Patient Instructions (Signed)
Your physician recommends that you continue on your current medications as directed. Please refer to the Current Medication list given to you today. Your physician recommends that you return for lab work TODAY (BMET, LIPIDS, LIVER) Your physician wants you to follow-up in: 6 MONTHS WITH DR. Elease HashimotoNAHSER. You will receive a reminder letter in the mail two months in advance. If you don't receive a letter, please call our office to schedule the follow-up appointment.

## 2015-12-08 LAB — LIPID PANEL
Cholesterol: 197 mg/dL (ref <200)
HDL: 61 mg/dL (ref >50)
LDL CALC: 115 mg/dL — AB (ref <100)
Total CHOL/HDL Ratio: 3.2 Ratio (ref <5.0)
Triglycerides: 104 mg/dL (ref <150)
VLDL: 21 mg/dL (ref <30)

## 2015-12-08 LAB — BASIC METABOLIC PANEL
BUN: 14 mg/dL (ref 7–25)
CALCIUM: 9.4 mg/dL (ref 8.6–10.4)
CHLORIDE: 106 mmol/L (ref 98–110)
CO2: 24 mmol/L (ref 20–31)
Creat: 0.98 mg/dL — ABNORMAL HIGH (ref 0.60–0.88)
Glucose, Bld: 101 mg/dL — ABNORMAL HIGH (ref 65–99)
Potassium: 4.1 mmol/L (ref 3.5–5.3)
SODIUM: 140 mmol/L (ref 135–146)

## 2015-12-08 LAB — HEPATIC FUNCTION PANEL
ALK PHOS: 60 U/L (ref 33–130)
ALT: 20 U/L (ref 6–29)
AST: 22 U/L (ref 10–35)
Albumin: 4.3 g/dL (ref 3.6–5.1)
BILIRUBIN DIRECT: 0.1 mg/dL (ref <=0.2)
BILIRUBIN INDIRECT: 0.5 mg/dL (ref 0.2–1.2)
TOTAL PROTEIN: 6.6 g/dL (ref 6.1–8.1)
Total Bilirubin: 0.6 mg/dL (ref 0.2–1.2)

## 2015-12-21 ENCOUNTER — Ambulatory Visit (INDEPENDENT_AMBULATORY_CARE_PROVIDER_SITE_OTHER): Payer: Medicare Other | Admitting: *Deleted

## 2015-12-21 DIAGNOSIS — Z5181 Encounter for therapeutic drug level monitoring: Secondary | ICD-10-CM | POA: Diagnosis not present

## 2015-12-21 DIAGNOSIS — I4819 Other persistent atrial fibrillation: Secondary | ICD-10-CM

## 2015-12-21 DIAGNOSIS — I481 Persistent atrial fibrillation: Secondary | ICD-10-CM

## 2015-12-21 DIAGNOSIS — I4891 Unspecified atrial fibrillation: Secondary | ICD-10-CM

## 2015-12-21 LAB — POCT INR: INR: 1.9

## 2015-12-21 MED ORDER — WARFARIN SODIUM 5 MG PO TABS: ORAL_TABLET | ORAL | 1 refills | Status: DC

## 2016-01-06 ENCOUNTER — Ambulatory Visit (INDEPENDENT_AMBULATORY_CARE_PROVIDER_SITE_OTHER): Payer: Medicare Other | Admitting: *Deleted

## 2016-01-06 DIAGNOSIS — Z5181 Encounter for therapeutic drug level monitoring: Secondary | ICD-10-CM | POA: Diagnosis not present

## 2016-01-06 DIAGNOSIS — I4819 Other persistent atrial fibrillation: Secondary | ICD-10-CM

## 2016-01-06 DIAGNOSIS — I4891 Unspecified atrial fibrillation: Secondary | ICD-10-CM

## 2016-01-06 DIAGNOSIS — I481 Persistent atrial fibrillation: Secondary | ICD-10-CM | POA: Diagnosis not present

## 2016-01-06 LAB — POCT INR: INR: 2

## 2016-01-27 ENCOUNTER — Ambulatory Visit (INDEPENDENT_AMBULATORY_CARE_PROVIDER_SITE_OTHER): Payer: Medicare Other | Admitting: *Deleted

## 2016-01-27 DIAGNOSIS — Z5181 Encounter for therapeutic drug level monitoring: Secondary | ICD-10-CM

## 2016-01-27 DIAGNOSIS — I481 Persistent atrial fibrillation: Secondary | ICD-10-CM

## 2016-01-27 DIAGNOSIS — I4891 Unspecified atrial fibrillation: Secondary | ICD-10-CM

## 2016-01-27 DIAGNOSIS — I4819 Other persistent atrial fibrillation: Secondary | ICD-10-CM

## 2016-01-27 LAB — POCT INR: INR: 1.7

## 2016-02-17 ENCOUNTER — Ambulatory Visit (INDEPENDENT_AMBULATORY_CARE_PROVIDER_SITE_OTHER): Payer: Medicare Other | Admitting: *Deleted

## 2016-02-17 DIAGNOSIS — Z5181 Encounter for therapeutic drug level monitoring: Secondary | ICD-10-CM | POA: Diagnosis not present

## 2016-02-17 DIAGNOSIS — I481 Persistent atrial fibrillation: Secondary | ICD-10-CM | POA: Diagnosis not present

## 2016-02-17 DIAGNOSIS — I4891 Unspecified atrial fibrillation: Secondary | ICD-10-CM | POA: Diagnosis not present

## 2016-02-17 DIAGNOSIS — I4819 Other persistent atrial fibrillation: Secondary | ICD-10-CM

## 2016-02-17 LAB — POCT INR: INR: 2.2

## 2016-03-16 ENCOUNTER — Ambulatory Visit (INDEPENDENT_AMBULATORY_CARE_PROVIDER_SITE_OTHER): Payer: Medicare Other | Admitting: *Deleted

## 2016-03-16 DIAGNOSIS — I4819 Other persistent atrial fibrillation: Secondary | ICD-10-CM

## 2016-03-16 DIAGNOSIS — I481 Persistent atrial fibrillation: Secondary | ICD-10-CM | POA: Diagnosis not present

## 2016-03-16 DIAGNOSIS — Z5181 Encounter for therapeutic drug level monitoring: Secondary | ICD-10-CM | POA: Diagnosis not present

## 2016-03-16 DIAGNOSIS — I4891 Unspecified atrial fibrillation: Secondary | ICD-10-CM

## 2016-03-16 LAB — POCT INR: INR: 3

## 2016-03-30 ENCOUNTER — Ambulatory Visit (INDEPENDENT_AMBULATORY_CARE_PROVIDER_SITE_OTHER): Payer: Medicare Other | Admitting: Ophthalmology

## 2016-04-08 ENCOUNTER — Ambulatory Visit (INDEPENDENT_AMBULATORY_CARE_PROVIDER_SITE_OTHER): Payer: Medicare Other | Admitting: Ophthalmology

## 2016-04-08 DIAGNOSIS — H353122 Nonexudative age-related macular degeneration, left eye, intermediate dry stage: Secondary | ICD-10-CM | POA: Diagnosis not present

## 2016-04-08 DIAGNOSIS — H353211 Exudative age-related macular degeneration, right eye, with active choroidal neovascularization: Secondary | ICD-10-CM

## 2016-04-08 DIAGNOSIS — H43813 Vitreous degeneration, bilateral: Secondary | ICD-10-CM

## 2016-04-11 ENCOUNTER — Ambulatory Visit (INDEPENDENT_AMBULATORY_CARE_PROVIDER_SITE_OTHER): Payer: Medicare Other | Admitting: Family Medicine

## 2016-04-11 ENCOUNTER — Encounter: Payer: Self-pay | Admitting: Family Medicine

## 2016-04-11 VITALS — BP 171/64 | HR 70 | Temp 98.0°F | Ht 63.0 in | Wt 158.5 lb

## 2016-04-11 DIAGNOSIS — I10 Essential (primary) hypertension: Secondary | ICD-10-CM

## 2016-04-11 DIAGNOSIS — I4819 Other persistent atrial fibrillation: Secondary | ICD-10-CM

## 2016-04-11 DIAGNOSIS — I481 Persistent atrial fibrillation: Secondary | ICD-10-CM | POA: Diagnosis not present

## 2016-04-11 MED ORDER — ATENOLOL 25 MG PO TABS: ORAL_TABLET | ORAL | 3 refills | Status: DC

## 2016-04-11 NOTE — Progress Notes (Signed)
Dr. Karleen Hampshire T. Renaldo Gornick, MD, CAQ Sports Medicine [facility] [address] [place], [address] Phone: [phone] Fax: [phone]  04/11/2016  Patient: Ellen Barajas, MRN: [id number], DOB: 1928-02-03, 83 y.o.  Primary Physician:  Eustaquio Boyden, MD   Chief Complaint  Patient presents with  . Follow-up    Blood Pressure per Eye Doctor   Subjective:   Ellen Barajas is a 83 y.o. very pleasant female patient who presents with the following:  171/64 on BP  BP Readings from Last 3 Encounters:  04/11/16 (!) 171/64  12/07/15 122/78  12/05/14 112/70    160/70  150's systolic at the eye doctor.   She does have AF and on Coumadin, but she only takes Atenolol 25 mg, 1/4 tablet a day.  Past Medical History, Surgical History, Social History, Family History, Problem List, Medications, and Allergies have been reviewed and updated if relevant.  Patient Active Problem List   Diagnosis Date Noted  . Medicare annual wellness visit, subsequent 10/16/2014  . Advanced care planning/counseling discussion 10/16/2014  . DNR (do not resuscitate) 10/16/2014  . Encounter for therapeutic drug monitoring 05/27/2014  . Osteoarthritis 04/19/2013  . Hx of CABG 12/17/2012  . Post herpetic neuralgia 02/25/2012  . IHD (ischemic heart disease)   . Dyslipidemia   . CAD (coronary artery disease)   . Persistent atrial fibrillation (HCC) 04/19/2010    Past Medical History:  Diagnosis Date  . AF (atrial fibrillation) (HCC)    coumadin  . Arthritis   . Coronary artery disease 2002   s/p CABG  . Diverticulosis   . Dyslipidemia   . History of chicken pox   . IHD (ischemic heart disease)   . Post herpetic neuralgia 02/2012    Past Surgical History:  Procedure Laterality Date  . BREAST LUMPECTOMY     left  . CATARACT EXTRACTION  2013   bilateral  . CORONARY ARTERY BYPASS GRAFT  2002   x 4    Social History   Social History  . Marital status: Married    Spouse  name: N/A  . Number of children: N/A  . Years of education: N/A   Occupational History  . Not on file.   Social History Main Topics  . Smoking status: Never Smoker  . Smokeless tobacco: Never Used  . Alcohol use No  . Drug use: No  . Sexual activity: Not on file   Other Topics Concern  . Not on file   Social History Narrative   Lives and cares for disabled husband (stroke)   Occupation: retired, was LPN    Family History  Problem Relation Age of Onset  . CAD Father 43    MI  . CAD Mother 33    MI    Allergies  Allergen Reactions  . Crestor [Rosuvastatin Calcium]   . Lipitor [Atorvastatin Calcium]   . Niaspan [Niacin Er]   . Other     PER PATIENT SHE DOES NOT TOLERATE NARCOTICS  . Pravastatin   . Ramipril [Ramipril]     Hyperkalemia  . Welchol [Colesevelam Hcl]   . Zetia [Ezetimibe]     Medication list reviewed and updated in full in [facility].   GEN: No acute illnesses, no fevers, chills. GI: No n/v/d, eating normally Pulm: No SOB Interactive and getting along well at home.  Otherwise, ROS is as per the HPI.  Objective:   BP (!) 171/64   Pulse 70  Temp 98 F (36.7 C) (Oral)   Ht 5\' 3"  (1.6 m)   Wt 158 lb 8 oz (71.9 kg)   BMI 28.08 kg/m   GEN: WDWN, NAD, Non-toxic, A & O x 3 HEENT: Atraumatic, Normocephalic. Neck supple. No masses, No LAD. Ears and Nose: No external deformity. CV: RRR, No M/G/R. No JVD. No thrill. No extra heart sounds. PULM: CTA B, no wheezes, crackles, rhonchi. No retractions. No resp. distress. No accessory muscle use. EXTR: No c/c/e NEURO Normal gait.  PSYCH: Normally interactive. Conversant. Not depressed or anxious appearing.  Calm demeanor.   Laboratory and Imaging Data:  Assessment and Plan:   Essential hypertension  Persistent atrial fibrillation (HCC)  New higher BP Pulse at 80's on my check  Increase Atenolol dose to 25 mg  [facility]  Follow-up: with PCP  Meds  ordered this encounter  Medications  . atenolol (TENORMIN) 25 MG tablet    Sig: Take 1 tablet by mouth daily    Dispense:  90 tablet    Refill:  3   Medications Discontinued During This Encounter  Medication Reason  . atenolol (TENORMIN) 25 MG tablet Reorder   No orders of the defined types were placed in this encounter.   Signed,  Elpidio GaleaSpencer T. Savhanna Sliva, MD   Allergies as of 04/11/2016      Reactions   Crestor [rosuvastatin Calcium]    Lipitor [atorvastatin Calcium]    Niaspan [niacin Er]    Other    PER PATIENT SHE DOES NOT TOLERATE NARCOTICS   Pravastatin    Ramipril [ramipril]    Hyperkalemia   Welchol [colesevelam Hcl]    Zetia [ezetimibe]       Medication List       Accurate as of 04/11/16  2:03 PM. Always use your most recent med list.          acetaminophen 325 MG tablet Commonly known as:  TYLENOL Take 325 mg by mouth every 6 (six) hours as needed (pain).   aspirin 81 MG tablet Take 81 mg by mouth daily.   atenolol 25 MG tablet Commonly known as:  TENORMIN Take 1 tablet by mouth daily   EYE VITAMINS Tabs Take 4 tablets by mouth daily.   fish oil-omega-3 fatty acids 1000 MG capsule Take 1 capsule by mouth 2 (two) times daily.   nitroGLYCERIN 0.4 MG SL tablet Commonly known as:  NITROSTAT Place 0.4 mg under the tongue every 5 (five) minutes as needed for chest pain (for chest pain).   psyllium 58.6 % packet Commonly known as:  METAMUCIL Take 1 packet by mouth daily as needed (constipation).   RED YEAST RICE PO Take by mouth 2 (two) times daily.   VITAMIN D-3 PO Take 10,000 Units by mouth daily.   warfarin 5 MG tablet Commonly known as:  COUMADIN Take as directed by anticoagulation clinic

## 2016-04-11 NOTE — Progress Notes (Signed)
Pre visit review using our clinic review tool, if applicable. No additional management support is needed unless otherwise documented below in the visit note. 

## 2016-04-13 ENCOUNTER — Ambulatory Visit (INDEPENDENT_AMBULATORY_CARE_PROVIDER_SITE_OTHER): Payer: Medicare Other | Admitting: *Deleted

## 2016-04-13 DIAGNOSIS — Z5181 Encounter for therapeutic drug level monitoring: Secondary | ICD-10-CM

## 2016-04-13 DIAGNOSIS — I4891 Unspecified atrial fibrillation: Secondary | ICD-10-CM | POA: Diagnosis not present

## 2016-04-13 DIAGNOSIS — I4819 Other persistent atrial fibrillation: Secondary | ICD-10-CM

## 2016-04-13 DIAGNOSIS — I481 Persistent atrial fibrillation: Secondary | ICD-10-CM

## 2016-04-13 LAB — POCT INR: INR: 2.3

## 2016-05-04 ENCOUNTER — Ambulatory Visit (INDEPENDENT_AMBULATORY_CARE_PROVIDER_SITE_OTHER): Payer: Medicare Other | Admitting: *Deleted

## 2016-05-04 DIAGNOSIS — Z5181 Encounter for therapeutic drug level monitoring: Secondary | ICD-10-CM | POA: Diagnosis not present

## 2016-05-04 DIAGNOSIS — I4891 Unspecified atrial fibrillation: Secondary | ICD-10-CM | POA: Diagnosis not present

## 2016-05-04 DIAGNOSIS — I4819 Other persistent atrial fibrillation: Secondary | ICD-10-CM

## 2016-05-04 DIAGNOSIS — I481 Persistent atrial fibrillation: Secondary | ICD-10-CM

## 2016-05-04 LAB — POCT INR: INR: 1.9

## 2016-05-25 ENCOUNTER — Ambulatory Visit (INDEPENDENT_AMBULATORY_CARE_PROVIDER_SITE_OTHER): Payer: Medicare Other | Admitting: *Deleted

## 2016-05-25 DIAGNOSIS — I481 Persistent atrial fibrillation: Secondary | ICD-10-CM | POA: Diagnosis not present

## 2016-05-25 DIAGNOSIS — Z5181 Encounter for therapeutic drug level monitoring: Secondary | ICD-10-CM | POA: Diagnosis not present

## 2016-05-25 DIAGNOSIS — I4891 Unspecified atrial fibrillation: Secondary | ICD-10-CM

## 2016-05-25 DIAGNOSIS — I4819 Other persistent atrial fibrillation: Secondary | ICD-10-CM

## 2016-05-25 LAB — POCT INR: INR: 2.5

## 2016-06-03 DIAGNOSIS — Z9841 Cataract extraction status, right eye: Secondary | ICD-10-CM | POA: Diagnosis not present

## 2016-06-03 DIAGNOSIS — Z9842 Cataract extraction status, left eye: Secondary | ICD-10-CM | POA: Diagnosis not present

## 2016-06-03 DIAGNOSIS — H353132 Nonexudative age-related macular degeneration, bilateral, intermediate dry stage: Secondary | ICD-10-CM | POA: Diagnosis not present

## 2016-06-13 ENCOUNTER — Ambulatory Visit (INDEPENDENT_AMBULATORY_CARE_PROVIDER_SITE_OTHER): Payer: Medicare Other | Admitting: *Deleted

## 2016-06-13 ENCOUNTER — Ambulatory Visit (INDEPENDENT_AMBULATORY_CARE_PROVIDER_SITE_OTHER): Payer: Medicare Other | Admitting: Cardiovascular Disease

## 2016-06-13 ENCOUNTER — Encounter (INDEPENDENT_AMBULATORY_CARE_PROVIDER_SITE_OTHER): Payer: Self-pay

## 2016-06-13 ENCOUNTER — Encounter: Payer: Self-pay | Admitting: Cardiovascular Disease

## 2016-06-13 VITALS — BP 174/68 | HR 90 | Ht 63.0 in | Wt 158.1 lb

## 2016-06-13 DIAGNOSIS — I2510 Atherosclerotic heart disease of native coronary artery without angina pectoris: Secondary | ICD-10-CM | POA: Diagnosis not present

## 2016-06-13 DIAGNOSIS — I4891 Unspecified atrial fibrillation: Secondary | ICD-10-CM

## 2016-06-13 DIAGNOSIS — Z5181 Encounter for therapeutic drug level monitoring: Secondary | ICD-10-CM

## 2016-06-13 DIAGNOSIS — I10 Essential (primary) hypertension: Secondary | ICD-10-CM | POA: Diagnosis not present

## 2016-06-13 DIAGNOSIS — I4819 Other persistent atrial fibrillation: Secondary | ICD-10-CM

## 2016-06-13 DIAGNOSIS — I481 Persistent atrial fibrillation: Secondary | ICD-10-CM

## 2016-06-13 LAB — POCT INR: INR: 2.9

## 2016-06-13 MED ORDER — LOSARTAN POTASSIUM 50 MG PO TABS: 50.0000 mg | ORAL_TABLET | Freq: Every day | ORAL | 3 refills | Status: DC

## 2016-06-13 NOTE — Patient Instructions (Addendum)
Medication Instructions:  START Losartan (Cozaar) 50 mg once daily   Labwork: Your physician recommends that you return for lab work on Monday June 11 for basic metabolic panel   Testing/Procedures: None Ordered   Follow-Up: Your physician wants you to follow-up in: 6 months with Dr. Elease HashimotoNahser.  You will receive a reminder letter in the mail two months in advance. If you don't receive a letter, please call our office to schedule the follow-up appointment.   If you need a refill on your cardiac medications before your next appointment, please call your pharmacy.   Thank you for choosing [facility]! Eligha BridegroomMichelle Swinyer, RN [phone]

## 2016-06-13 NOTE — Progress Notes (Signed)
Cardiology Office Note   Date:  06/13/2016   ID:  Ellen Barajas, DOB 1928/09/25, MRN [id number]  PCP:  Eustaquio Boyden, MD  Cardiologist: Cassell Clement MD --->  Nahser   Chief Complaint  Patient presents with  . Atrial Fibrillation   Problem list 1. Coronary artery disease-status post coronary artery bypass grafting 2. Atrial fibrillation - persistent  3. Hyperlipidemia     Previous notes from Ellen Barajas is a 83 y.o. female who presents for scheduled follow-up visit  This pleasant 83 year old woman is seen for a scheduled four-month followup office visit. She has a history of ischemic heart disease. She has had previous CABG. [name] been in chronic atrial fibrillation. She underwent cardioversion on 10/23/09 but 1 week later was back in atrial fibrillation. We have made no further attempts at cardioversion. She is essentially asymptomatic with her rhythm. She has a history of hypercholesterolemia and is on over-the-counter preparations. We are checking lipids today. Since last visit she has had no new cardiac symptoms. She remains on long-term Coumadin. Her post herpetic neuralgia pain is improved. She has osteoarthritis of the knees. She does not take anything for it. I suggested that she try some generic Tylenol Since her last visit she has not been experiencing any TIA or stroke symptoms.  No increased exertional dyspnea.  No chest pain.  Nov. 13, 2017:  Ellen Barajas is seen today for initial visit .   Seen with husband, Leonette Most.   [name]'s older sister.  No CP or dyspnea . Is in chronic atrial fib.   She is completely asymptomatic.     Jun 13, 2016:    Seen back today for follow up HTN BP is elevated.   Past Medical History:  Diagnosis Date  . AF (atrial fibrillation) (HCC)    coumadin  . Arthritis   . Coronary artery disease 2002   s/p CABG  . Diverticulosis   . Dyslipidemia   . History of chicken pox   . IHD (ischemic heart disease)   . Post  herpetic neuralgia 02/2012    Past Surgical History:  Procedure Laterality Date  . BREAST LUMPECTOMY     left  . CATARACT EXTRACTION  2013   bilateral  . CORONARY ARTERY BYPASS GRAFT  2002   x 4     Current Outpatient Prescriptions  Medication Sig Dispense Refill  . aspirin 81 MG tablet Take 81 mg by mouth daily.      Marland Kitchen atenolol (TENORMIN) 25 MG tablet Take 1 tablet by mouth daily 90 tablet 3  . fish oil-omega-3 fatty acids 1000 MG capsule Take 1 capsule by mouth 2 (two) times daily.     . Multiple Vitamins-Minerals (EYE VITAMINS) TABS Take 4 tablets by mouth daily.     . nitroGLYCERIN (NITROSTAT) 0.4 MG SL tablet Place 0.4 mg under the tongue every 5 (five) minutes as needed for chest pain (for chest pain).    . Red Yeast Rice Extract (RED YEAST RICE PO) Take by mouth 2 (two) times daily.      Marland Kitchen warfarin (COUMADIN) 5 MG tablet Take as directed by anticoagulation clinic 90 tablet 1   No current facility-administered medications for this visit.     Allergies:   Crestor [rosuvastatin calcium]; Lipitor [atorvastatin calcium]; Niaspan [niacin er]; Other; Pravastatin; Ramipril [ramipril]; Welchol [colesevelam hcl]; and Zetia [ezetimibe]    Social History:  The patient  reports that she has never smoked. She has never used smokeless  tobacco. She reports that she does not drink alcohol or use drugs.   Family History:  The patient's family history includes CAD (age of onset: 4474) in her father; CAD (age of onset: 5690) in her mother.    ROS:  Please see the history of present illness.   Otherwise, review of systems are positive for none.   All other systems are reviewed and negative.    PHYSICAL EXAM: VS:  BP (!) 174/68 (BP Location: Right Arm, Patient Position: Sitting, Cuff Size: Large)   Pulse 90   Ht 5\' 3"  (1.6 m)   Wt 158 lb 1.9 oz (71.7 kg)   SpO2 97%   BMI 28.01 kg/m  , BMI Body mass index is 28.01 kg/m. GEN: Well nourished, well developed, in no acute distress  HEENT:  normal  Neck: no JVD, carotid bruits, or masses Cardiac: Irregularly irregular, soft systolic murmur , rubs, or gallops, trace - mild  edema  Respiratory:  clear to auscultation bilaterally, normal work of breathing GI: soft, nontender, nondistended, + BS MS: no deformity or atrophy  Skin: warm and dry, no rash Neuro:  Strength and sensation are intact Psych: euthymic mood, full affect   EKG:  EKG is ordered today. The ekg ordered today demonstrates atrial fibrillation with controlled ventricular rate of 88 bpm.  Nonspecific ST-T wave changes.   Recent Labs: 12/07/2015: ALT 20; BUN 14; Creat 0.98; Potassium 4.1; Sodium 140    Lipid Panel    Component Value Date/Time   CHOL 197 12/07/2015 1501   TRIG 104 12/07/2015 1501   HDL 61 12/07/2015 1501   CHOLHDL 3.2 12/07/2015 1501   VLDL 21 12/07/2015 1501   LDLCALC 115 (H) 12/07/2015 1501   LDLDIRECT 147.5 12/17/2012 0920      Wt Readings from Last 3 Encounters:  06/13/16 158 lb 1.9 oz (71.7 kg)  04/11/16 158 lb 8 oz (71.9 kg)  12/07/15 159 lb 12.8 oz (72.5 kg)        ASSESSMENT AND PLAN:  1. Ischemic heart disease status post CABG - she's doing very well. [name] not having any episodes of angina. Continue current medications.  2. permanent atrial fibrillation- basically asymptomatic. Continue anticoagulation with Coumadin.  3. Hypercholesterolemia Will check labs today   4.  HTN:   Will add Losartan 50 mg a day  Advised her to avoid salt .      Kristeen MissPhilip Nahser, MD  06/13/2016 3:32 PM    [facility] [address],  [address] [place], [place]  [address] Pager [phone] Phone: [phone]; Fax: [phone]

## 2016-07-04 ENCOUNTER — Ambulatory Visit (INDEPENDENT_AMBULATORY_CARE_PROVIDER_SITE_OTHER): Payer: Medicare Other

## 2016-07-04 ENCOUNTER — Other Ambulatory Visit: Payer: Medicare Other

## 2016-07-04 DIAGNOSIS — I4891 Unspecified atrial fibrillation: Secondary | ICD-10-CM

## 2016-07-04 DIAGNOSIS — I2510 Atherosclerotic heart disease of native coronary artery without angina pectoris: Secondary | ICD-10-CM

## 2016-07-04 DIAGNOSIS — I481 Persistent atrial fibrillation: Secondary | ICD-10-CM | POA: Diagnosis not present

## 2016-07-04 DIAGNOSIS — I4819 Other persistent atrial fibrillation: Secondary | ICD-10-CM

## 2016-07-04 DIAGNOSIS — I10 Essential (primary) hypertension: Secondary | ICD-10-CM

## 2016-07-04 DIAGNOSIS — Z5181 Encounter for therapeutic drug level monitoring: Secondary | ICD-10-CM

## 2016-07-04 LAB — POCT INR: INR: 2.2

## 2016-07-25 ENCOUNTER — Ambulatory Visit (INDEPENDENT_AMBULATORY_CARE_PROVIDER_SITE_OTHER): Payer: Medicare Other | Admitting: *Deleted

## 2016-07-25 ENCOUNTER — Encounter (INDEPENDENT_AMBULATORY_CARE_PROVIDER_SITE_OTHER): Payer: Self-pay

## 2016-07-25 DIAGNOSIS — Z5181 Encounter for therapeutic drug level monitoring: Secondary | ICD-10-CM

## 2016-07-25 DIAGNOSIS — I4891 Unspecified atrial fibrillation: Secondary | ICD-10-CM | POA: Diagnosis not present

## 2016-07-25 DIAGNOSIS — I481 Persistent atrial fibrillation: Secondary | ICD-10-CM

## 2016-07-25 DIAGNOSIS — I4819 Other persistent atrial fibrillation: Secondary | ICD-10-CM

## 2016-07-25 LAB — POCT INR: INR: 2.3

## 2016-08-25 NOTE — Progress Notes (Signed)
Pre visit review using our clinic review tool, if applicable. No additional management support is needed unless otherwise documented below in the visit note. 

## 2016-08-29 ENCOUNTER — Other Ambulatory Visit: Payer: Self-pay | Admitting: Family Medicine

## 2016-08-29 DIAGNOSIS — E785 Hyperlipidemia, unspecified: Secondary | ICD-10-CM

## 2016-08-29 DIAGNOSIS — I10 Essential (primary) hypertension: Secondary | ICD-10-CM

## 2016-08-29 DIAGNOSIS — I4819 Other persistent atrial fibrillation: Secondary | ICD-10-CM

## 2016-09-01 ENCOUNTER — Other Ambulatory Visit: Payer: Medicare Other

## 2016-09-01 ENCOUNTER — Ambulatory Visit (INDEPENDENT_AMBULATORY_CARE_PROVIDER_SITE_OTHER): Payer: Medicare Other

## 2016-09-01 VITALS — BP 140/60 | HR 82 | Temp 97.8°F | Ht 62.0 in | Wt 153.0 lb

## 2016-09-01 DIAGNOSIS — E785 Hyperlipidemia, unspecified: Secondary | ICD-10-CM

## 2016-09-01 DIAGNOSIS — Z0000 Encounter for general adult medical examination without abnormal findings: Secondary | ICD-10-CM | POA: Diagnosis not present

## 2016-09-01 DIAGNOSIS — I10 Essential (primary) hypertension: Secondary | ICD-10-CM | POA: Diagnosis not present

## 2016-09-01 DIAGNOSIS — I481 Persistent atrial fibrillation: Secondary | ICD-10-CM

## 2016-09-01 DIAGNOSIS — I4819 Other persistent atrial fibrillation: Secondary | ICD-10-CM

## 2016-09-01 LAB — BASIC METABOLIC PANEL
BUN: 22 mg/dL (ref 6–23)
CALCIUM: 9.1 mg/dL (ref 8.4–10.5)
CO2: 29 meq/L (ref 19–32)
CREATININE: 0.91 mg/dL (ref 0.40–1.20)
Chloride: 108 mEq/L (ref 96–112)
GFR: 62.3 mL/min (ref >60.00)
GLUCOSE: 100 mg/dL — AB (ref 70–99)
Potassium: 4.5 mEq/L (ref 3.5–5.1)
Sodium: 141 mEq/L (ref 135–145)

## 2016-09-01 LAB — CBC WITH DIFFERENTIAL/PLATELET
BASOS PCT: 0.4 % (ref 0.0–3.0)
Basophils Absolute: 0 10*3/uL (ref 0.0–0.1)
EOS PCT: 5.5 % — AB (ref 0.0–5.0)
Eosinophils Absolute: 0.4 10*3/uL (ref 0.0–0.7)
HEMATOCRIT: 42.1 % (ref 36.0–46.0)
HEMOGLOBIN: 13.5 g/dL (ref 12.0–15.0)
Lymphocytes Relative: 20.4 % (ref 12.0–46.0)
Lymphs Abs: 1.4 10*3/uL (ref 0.7–4.0)
MCHC: 32 g/dL (ref 30.0–36.0)
MCV: 93.7 fl (ref 78.0–100.0)
MONOS PCT: 9.1 % (ref 3.0–12.0)
Monocytes Absolute: 0.6 10*3/uL (ref 0.1–1.0)
Neutro Abs: 4.3 10*3/uL (ref 1.4–7.7)
Neutrophils Relative %: 64.6 % (ref 43.0–77.0)
Platelets: 237 10*3/uL (ref 150.0–400.0)
RBC: 4.49 Mil/uL (ref 3.87–5.11)
RDW: 13.6 % (ref 11.5–15.5)
WBC: 6.6 10*3/uL (ref 4.0–10.5)

## 2016-09-01 LAB — LIPID PANEL
CHOLESTEROL: 174 mg/dL (ref 0–200)
HDL: 50.2 mg/dL (ref >39.00)
LDL CALC: 111 mg/dL — AB (ref 0–99)
NONHDL: 123.38
TRIGLYCERIDES: 64 mg/dL (ref 0.0–149.0)
Total CHOL/HDL Ratio: 3
VLDL: 12.8 mg/dL (ref 0.0–40.0)

## 2016-09-01 NOTE — Patient Instructions (Signed)
Ellen Barajas , Thank you for taking time to come for your Medicare Wellness Visit. I appreciate your ongoing commitment to your health goals. Please review the following plan we discussed and let me know if I can assist you in the future.   These are the goals we discussed: Goals    . SAFETY          Starting 09/01/2016, I will continue to use cane as needed to reduce risk of falls.        This is a list of the screening recommended for you and due dates:  Health Maintenance  Topic Date Due  . DTaP/Tdap/Td vaccine (1 - Tdap) 09/01/2048*  . Flu Shot  09/01/2048*  . DEXA scan (bone density measurement)  09/01/2048*  . Tetanus Vaccine  09/01/2048*  . Pneumonia vaccines (1 of 2 - PCV13) 09/01/2048*  *Topic was postponed. The date shown is not the original due date.   Preventive Care for Adults  A healthy lifestyle and preventive care can promote health and wellness. Preventive health guidelines for adults include the following key practices.  . A routine yearly physical is a good way to check with your health care provider about your health and preventive screening. It is a chance to share any concerns and updates on your health and to receive a thorough exam.  . Visit your dentist for a routine exam and preventive care every 6 months. Brush your teeth twice a day and floss once a day. Good oral hygiene prevents tooth decay and gum disease.  . The frequency of eye exams is based on your age, health, family medical history, use  of contact lenses, and other factors. Follow your health care provider's ecommendations for frequency of eye exams.  . Eat a healthy diet. Foods like vegetables, fruits, whole grains, low-fat dairy products, and lean protein foods contain the nutrients you need without too many calories. Decrease your intake of foods high in solid fats, added sugars, and salt. Eat the right amount of calories for you. Get information about a proper diet from your health care provider,  if necessary.  . Regular physical exercise is one of the most important things you can do for your health. Most adults should get at least 150 minutes of moderate-intensity exercise (any activity that increases your heart rate and causes you to sweat) each week. In addition, most adults need muscle-strengthening exercises on 2 or more days a week.  Silver Sneakers may be a benefit available to you. To determine eligibility, you may visit the website: www.silversneakers.com or contact program at 234-307-1195 Mon-Fri between 8AM-8PM.   . Maintain a healthy weight. The body mass index (BMI) is a screening tool to identify possible weight problems. It provides an estimate of body fat based on height and weight. Your health care provider can find your BMI and can help you achieve or maintain a healthy weight.   For adults 20 years and older: ? A BMI below 18.5 is considered underweight. ? A BMI of 18.5 to 24.9 is normal. ? A BMI of 25 to 29.9 is considered overweight. ? A BMI of 30 and above is considered obese.   . Maintain normal blood lipids and cholesterol levels by exercising and minimizing your intake of saturated fat. Eat a balanced diet with plenty of fruit and vegetables. Blood tests for lipids and cholesterol should begin at age 58 and be repeated every 5 years. If your lipid or cholesterol levels are high, you are over  50, or you are at high risk for heart disease, you may need your cholesterol levels checked more frequently. Ongoing high lipid and cholesterol levels should be treated with medicines if diet and exercise are not working.  . If you smoke, find out from your health care provider how to quit. If you do not use tobacco, please do not start.  . If you choose to drink alcohol, please do not consume more than 2 drinks per day. One drink is considered to be 12 ounces (355 mL) of beer, 5 ounces (148 mL) of wine, or 1.5 ounces (44 mL) of liquor.  . If you are 4255-81 years old, ask  your health care provider if you should take aspirin to prevent strokes.  . Use sunscreen. Apply sunscreen liberally and repeatedly throughout the day. You should seek shade when your shadow is shorter than you. Protect yourself by wearing long sleeves, pants, a wide-brimmed hat, and sunglasses year round, whenever you are outdoors.  . Once a month, do a whole body skin exam, using a mirror to look at the skin on your back. Tell your health care provider of new moles, moles that have irregular borders, moles that are larger than a pencil eraser, or moles that have changed in shape or color.

## 2016-09-01 NOTE — Progress Notes (Signed)
Subjective:   Ellen Barajas is a 83 y.o. female who presents for Medicare Annual (Subsequent) preventive examination.  Review of Systems:  N/A Cardiac Risk Factors include: advanced age (>4955men, 11>65 women);hypertension;dyslipidemia     Objective:     Vitals: BP 140/60 (BP Location: Right Arm, Patient Position: Sitting, Cuff Size: Normal)   Pulse 82   Temp 97.8 F (36.6 C) (Oral)   Ht 5\' 2"  (1.575 m) Comment: no shoes  Wt 153 lb (69.4 kg)   SpO2 94%   BMI 27.98 kg/m   Body mass index is 27.98 kg/m.   Tobacco History  Smoking Status  . Never Smoker  Smokeless Tobacco  . Never Used     Counseling given: No   Past Medical History:  Diagnosis Date  . AF (atrial fibrillation) (HCC)    coumadin  . Arthritis   . Coronary artery disease 2002   s/p CABG  . Diverticulosis   . Dyslipidemia   . History of chicken pox   . IHD (ischemic heart disease)   . Post herpetic neuralgia 02/2012   Past Surgical History:  Procedure Laterality Date  . BREAST LUMPECTOMY     left  . CATARACT EXTRACTION  2013   bilateral  . CORONARY ARTERY BYPASS GRAFT  2002   x 4   Family History  Problem Relation Age of Onset  . CAD Father 174       MI  . CAD Mother 2490       MI   History  Sexual Activity  . Sexual activity: Not on file    Outpatient Encounter Prescriptions as of 09/01/2016  Medication Sig  . aspirin 81 MG tablet Take 81 mg by mouth daily.    Marland Kitchen. atenolol (TENORMIN) 25 MG tablet Take 1 tablet by mouth daily  . fish oil-omega-3 fatty acids 1000 MG capsule Take 1 capsule by mouth 2 (two) times daily.   Marland Kitchen. losartan (COZAAR) 50 MG tablet Take 1 tablet (50 mg total) by mouth daily.  . Red Yeast Rice Extract (RED YEAST RICE PO) Take by mouth 2 (two) times daily.    Marland Kitchen. warfarin (COUMADIN) 5 MG tablet Take as directed by anticoagulation clinic  . [DISCONTINUED] Multiple Vitamins-Minerals (EYE VITAMINS) TABS Take 4 tablets by mouth daily.   . nitroGLYCERIN (NITROSTAT) 0.4 MG SL  tablet Place 0.4 mg under the tongue every 5 (five) minutes as needed for chest pain (for chest pain).   No facility-administered encounter medications on file as of 09/01/2016.     Activities of Daily Living In your present state of health, do you have any difficulty performing the following activities: 09/01/2016  Hearing? Y  Vision? N  Difficulty concentrating or making decisions? N  Walking or climbing stairs? N  Dressing or bathing? N  Doing errands, shopping? N  Preparing Food and eating ? N  Using the Toilet? N  In the past six months, have you accidently leaked urine? N  Comment pt has a catheter  Do you have problems with loss of bowel control? N  Managing your Medications? N  Managing your Finances? N  Housekeeping or managing your Housekeeping? N  Some recent data might be hidden    Patient Care Team: Eustaquio BoydenGutierrez, Javier, MD as PCP - General (Family Medicine)    Assessment:     Hearing Screening   125Hz  250Hz  500Hz  1000Hz  2000Hz  3000Hz  4000Hz  6000Hz  8000Hz   Right ear:           Left  ear:   0 0 0  0    Comments: Hearing aid (right ear)   Visual Acuity Screening   Right eye Left eye Both eyes  Without correction: 20/40 20/40 20/40   With correction:       Exercise Activities and Dietary recommendations Current Exercise Habits: The patient does not participate in regular exercise at present, Exercise limited by: None identified  Goals    . SAFETY          Starting 09/01/2016, I will continue to use cane as needed to reduce risk of falls.       Fall Risk Fall Risk  09/01/2016 10/16/2014  Falls in the past year? No No   Depression Screen PHQ 2/9 Scores 09/01/2016 10/16/2014  PHQ - 2 Score 0 0     Cognitive Function MMSE - Mini Mental State Exam 09/01/2016  Orientation to time 5  Orientation to Place 5  Registration 3  Attention/ Calculation 0  Recall 0  Language- name 2 objects 0  Language- repeat 1  Language- follow 3 step command 3  Language- read & follow  direction 0  Write a sentence 0  Copy design 0  Total score 17     PLEASE NOTE: A Mini-Cog screen was completed. Maximum score is 20. A value of 0 denotes this part of Folstein MMSE was not completed or the patient failed this part of the Mini-Cog screening.   Mini-Cog Screening Orientation to Time - Max 5 pts Orientation to Place - Max 5 pts Registration - Max 3 pts Recall - Max 3 pts Language Repeat - Max 1 pts Language Follow 3 Step Command - Max 3 pts     Immunization History  Administered Date(s) Administered  . Td 01/24/2005   Screening Tests Health Maintenance  Topic Date Due  . DTaP/Tdap/Td (1 - Tdap) 09/01/2048 (Originally 01/25/2005)  . INFLUENZA VACCINE  09/01/2048 (Originally 08/24/2016)  . DEXA SCAN  09/01/2048 (Originally 08/31/1995)  . TETANUS/TDAP  09/01/2048 (Originally 01/25/2015)  . PNA vac Low Risk Adult (1 of 2 - PCV13) 09/01/2048 (Originally 08/31/1995)      Plan:     I have personally reviewed and addressed the Medicare Annual Wellness questionnaire and have noted the following in the patient's chart:  A. Medical and social history B. Use of alcohol, tobacco or illicit drugs  C. Current medications and supplements D. Functional ability and status E.  Nutritional status F.  Physical activity G. Advance directives H. List of other physicians I.  Hospitalizations, surgeries, and ER visits in previous 12 months J.  Vitals K. Screenings to include hearing, vision, cognitive, depression L. Referrals and appointments - none  In addition, I have reviewed and discussed with patient certain preventive protocols, quality metrics, and best practice recommendations. A written personalized care plan for preventive services as well as general preventive health recommendations were provided to patient.  See attached scanned questionnaire for additional information.   Signed,   Randa Evens, MHA, BS, LPN Health Coach

## 2016-09-01 NOTE — Progress Notes (Signed)
PCP notes:   Health maintenance:  Vaccines - pt declined all vaccinations Bone density - pt declined  Abnormal screenings:   Mini-Cog score: 17/20 Hearing (left ear) - failed  Patient concerns:   None  Nurse concerns:  None  Next PCP appt:   09/21/16 @ 1200  I reviewed health advisor's note, was available for consultation on the day of service listed in this note, and agree with documentation and plan. Crawford GivensGraham Duncan, MD.

## 2016-09-08 ENCOUNTER — Encounter: Payer: Medicare Other | Admitting: Family Medicine

## 2016-09-14 ENCOUNTER — Ambulatory Visit (INDEPENDENT_AMBULATORY_CARE_PROVIDER_SITE_OTHER): Payer: Medicare Other | Admitting: *Deleted

## 2016-09-14 DIAGNOSIS — I481 Persistent atrial fibrillation: Secondary | ICD-10-CM

## 2016-09-14 DIAGNOSIS — I4819 Other persistent atrial fibrillation: Secondary | ICD-10-CM

## 2016-09-14 DIAGNOSIS — Z5181 Encounter for therapeutic drug level monitoring: Secondary | ICD-10-CM

## 2016-09-14 DIAGNOSIS — I4891 Unspecified atrial fibrillation: Secondary | ICD-10-CM

## 2016-09-14 LAB — POCT INR: INR: 2.1

## 2016-09-14 MED ORDER — WARFARIN SODIUM 5 MG PO TABS: ORAL_TABLET | ORAL | 0 refills | Status: DC

## 2016-09-21 ENCOUNTER — Encounter: Payer: Self-pay | Admitting: Family Medicine

## 2016-09-21 ENCOUNTER — Ambulatory Visit (INDEPENDENT_AMBULATORY_CARE_PROVIDER_SITE_OTHER): Payer: Medicare Other | Admitting: Family Medicine

## 2016-09-21 VITALS — BP 152/68 | HR 75 | Temp 97.8°F | Ht 62.0 in | Wt 154.2 lb

## 2016-09-21 DIAGNOSIS — Z66 Do not resuscitate: Secondary | ICD-10-CM

## 2016-09-21 DIAGNOSIS — Z7189 Other specified counseling: Secondary | ICD-10-CM | POA: Diagnosis not present

## 2016-09-21 DIAGNOSIS — I4819 Other persistent atrial fibrillation: Secondary | ICD-10-CM

## 2016-09-21 DIAGNOSIS — I10 Essential (primary) hypertension: Secondary | ICD-10-CM

## 2016-09-21 DIAGNOSIS — E785 Hyperlipidemia, unspecified: Secondary | ICD-10-CM

## 2016-09-21 DIAGNOSIS — Z0000 Encounter for general adult medical examination without abnormal findings: Secondary | ICD-10-CM | POA: Diagnosis not present

## 2016-09-21 DIAGNOSIS — I481 Persistent atrial fibrillation: Secondary | ICD-10-CM | POA: Diagnosis not present

## 2016-09-21 NOTE — Assessment & Plan Note (Addendum)
BP better controlled with recent addition of losartan by cardiology - continue this.

## 2016-09-21 NOTE — Assessment & Plan Note (Signed)
Chronic, stable. Sounds regular today. Followed by cardiology coumadin clinic.

## 2016-09-21 NOTE — Assessment & Plan Note (Signed)
Chronic, overall stable on current regimen of RYR BID and fish oil BID

## 2016-09-21 NOTE — Progress Notes (Signed)
BP (!) 152/68   Pulse 75   Temp 97.8 F (36.6 C) (Oral)   Ht 5\' 2"  (1.575 m)   Wt 154 lb 4 oz (70 kg)   SpO2 96%   BMI 28.21 kg/m    CC: CPE Subjective:    Patient ID: Ellen Barajas, female    DOB: [birth year], 83 y.o.   MRN: [id number]  HPI: Ellen Barajas is a 83 y.o. female presenting on 09/21/2016 for Annual Exam (Medicare pt 2)   Here with niece [name].   Saw Lesia earlier in the month for medicare wellness visit. Note reviewed. Declines vaccinations. minicog 17/20. Failed hearing screen.   I last saw patient 2016.  She cares for disabled husband.  Saw Dr Elease Hashimoto 05/2016 - losartan 50mg  was started.  L eye vision changes over the past week - planning to see ophtho for this.  Preventative: Breast exam - with Dr Patty Sermons  Colon cancer screening - age out. No blood in stool or BM changes  Declines flu shot  Td 2007  Pneumonia - declines  zostavax - previously provided Rx Advanced planning - working on finalizing this. HCPOA is niece [name]. No prolonged life support. Wants to be DNR. Form filled out today.   Lives and cares for disabled husband (stroke) Occupation: retired, was LPN  Relevant past medical, surgical, family and social history reviewed and updated as indicated. Interim medical history since our last visit reviewed. Allergies and medications reviewed and updated. Outpatient Medications Prior to Visit  Medication Sig Dispense Refill  . aspirin 81 MG tablet Take 81 mg by mouth daily.      Marland Kitchen atenolol (TENORMIN) 25 MG tablet Take 1 tablet by mouth daily 90 tablet 3  . fish oil-omega-3 fatty acids 1000 MG capsule Take 1 capsule by mouth 2 (two) times daily.     . Red Yeast Rice Extract (RED YEAST RICE PO) Take by mouth 2 (two) times daily.      Marland Kitchen warfarin (COUMADIN) 5 MG tablet Take as directed by anticoagulation clinic 90 tablet 0  . losartan (COZAAR) 50 MG tablet Take 1 tablet (50 mg total) by mouth daily. 90 tablet 3  . nitroGLYCERIN (NITROSTAT) 0.4 MG  SL tablet Place 0.4 mg under the tongue every 5 (five) minutes as needed for chest pain (for chest pain).     No facility-administered medications prior to visit.      Per HPI unless specifically indicated in ROS section below Review of Systems  Constitutional: Negative for activity change, appetite change, chills, fatigue, fever and unexpected weight change.  HENT: Negative for hearing loss.   Eyes: Negative for visual disturbance.  Respiratory: Negative for cough, chest tightness, shortness of breath and wheezing.   Cardiovascular: Negative for chest pain, palpitations and leg swelling.  Gastrointestinal: Negative for abdominal distention, abdominal pain, blood in stool, constipation, diarrhea, nausea and vomiting.  Genitourinary: Negative for difficulty urinating and hematuria.  Musculoskeletal: Negative for arthralgias, myalgias and neck pain.  Skin: Negative for rash.  Neurological: Negative for dizziness, seizures, syncope and headaches.  Hematological: Negative for adenopathy. Does not bruise/bleed easily.  Psychiatric/Behavioral: Negative for dysphoric mood. The patient is not nervous/anxious.        Objective:    BP (!) 152/68   Pulse 75   Temp 97.8 F (36.6 C) (Oral)   Ht 5\' 2"  (1.575 m)   Wt 154 lb 4 oz (70 kg)   SpO2 96%   BMI 28.21 kg/m  Wt Readings from Last 3 Encounters:  09/21/16 154 lb 4 oz (70 kg)  09/01/16 153 lb (69.4 kg)  06/13/16 158 lb 1.9 oz (71.7 kg)    Physical Exam  Constitutional: She is oriented to person, place, and time. She appears well-developed and well-nourished. No distress.  HENT:  Head: Normocephalic and atraumatic.  Right Ear: Hearing, tympanic membrane, external ear and ear canal normal.  Left Ear: Hearing, tympanic membrane, external ear and ear canal normal.  Nose: Nose normal.  Mouth/Throat: Uvula is midline, oropharynx is clear and moist and mucous membranes are normal. No oropharyngeal exudate, posterior oropharyngeal edema or  posterior oropharyngeal erythema.  Eyes: Pupils are equal, round, and reactive to light. Conjunctivae and EOM are normal. No scleral icterus.  Neck: Normal range of motion. Neck supple.  Cardiovascular: Normal rate, regular rhythm and intact distal pulses.   Murmur (3/6 systolic) heard. Pulses:      Radial pulses are 2+ on the right side, and 2+ on the left side.  Pulmonary/Chest: Effort normal and breath sounds normal. No respiratory distress. She has no wheezes. She has no rales.  Abdominal: Soft. Bowel sounds are normal. She exhibits no distension and no mass. There is no tenderness. There is no rebound and no guarding.  Musculoskeletal: Normal range of motion. She exhibits edema (tr).  Lymphadenopathy:    She has no cervical adenopathy.  Neurological: She is alert and oriented to person, place, and time.  CN grossly intact, station and gait intact  Skin: Skin is warm and dry. No rash noted.  Psychiatric: She has a normal mood and affect. Her behavior is normal. Judgment and thought content normal.  Nursing note and vitals reviewed.  Results for orders placed or performed in visit on 09/14/16  POCT INR  Result Value Ref Range   INR 2.1       Assessment & Plan:   Problem List Items Addressed This Visit    Advanced care planning/counseling discussion    Advanced planning - working on finalizing this. HCPOA is niece Ashley MurrainCarla Chambers. No prolonged life support. Wants to be DNR. Form filled out today.       DNR (do not resuscitate)    Updated today.       Dyslipidemia    Chronic, overall stable on current regimen of RYR BID and fish oil BID      Essential hypertension    BP better controlled with recent addition of losartan by cardiology - continue this.       Health maintenance examination - Primary    Preventative protocols reviewed and updated unless pt declined. Discussed healthy diet and lifestyle.       Persistent atrial fibrillation (HCC)    Chronic, stable. Sounds  regular today. Followed by cardiology coumadin clinic.           Follow up plan: Return in about 1 year (around 09/21/2017) for annual exam, prior fasting for blood work, medicare wellness visit.  Eustaquio BoydenJavier Levante Simones, MD

## 2016-09-21 NOTE — Assessment & Plan Note (Signed)
Advanced planning - working on finalizing this. HCPOA is niece Ashley MurrainCarla Chambers. No prolonged life support. Wants to be DNR. Form filled out today.

## 2016-09-21 NOTE — Patient Instructions (Addendum)
You are doing well today. Continue current medicines. Return as needed or in 1 year for next medicare wellness visit with Virl Axe and physical with me.  DNR form filled out today.

## 2016-09-21 NOTE — Assessment & Plan Note (Signed)
Preventative protocols reviewed and updated unless pt declined. Discussed healthy diet and lifestyle.  

## 2016-09-21 NOTE — Assessment & Plan Note (Signed)
Updated today.

## 2016-10-03 DIAGNOSIS — H353221 Exudative age-related macular degeneration, left eye, with active choroidal neovascularization: Secondary | ICD-10-CM | POA: Diagnosis not present

## 2016-10-03 DIAGNOSIS — H524 Presbyopia: Secondary | ICD-10-CM | POA: Diagnosis not present

## 2016-10-06 ENCOUNTER — Encounter (INDEPENDENT_AMBULATORY_CARE_PROVIDER_SITE_OTHER): Payer: Medicare Other | Admitting: Ophthalmology

## 2016-10-06 DIAGNOSIS — H43813 Vitreous degeneration, bilateral: Secondary | ICD-10-CM | POA: Diagnosis not present

## 2016-10-06 DIAGNOSIS — H33301 Unspecified retinal break, right eye: Secondary | ICD-10-CM | POA: Diagnosis not present

## 2016-10-06 DIAGNOSIS — H35033 Hypertensive retinopathy, bilateral: Secondary | ICD-10-CM

## 2016-10-06 DIAGNOSIS — I10 Essential (primary) hypertension: Secondary | ICD-10-CM

## 2016-10-06 DIAGNOSIS — H353231 Exudative age-related macular degeneration, bilateral, with active choroidal neovascularization: Secondary | ICD-10-CM

## 2016-10-07 ENCOUNTER — Encounter (INDEPENDENT_AMBULATORY_CARE_PROVIDER_SITE_OTHER): Payer: Medicare Other | Admitting: Ophthalmology

## 2016-10-07 ENCOUNTER — Ambulatory Visit (INDEPENDENT_AMBULATORY_CARE_PROVIDER_SITE_OTHER): Payer: Medicare Other | Admitting: Ophthalmology

## 2016-10-19 ENCOUNTER — Encounter (INDEPENDENT_AMBULATORY_CARE_PROVIDER_SITE_OTHER): Payer: Medicare Other | Admitting: Ophthalmology

## 2016-10-19 DIAGNOSIS — H353231 Exudative age-related macular degeneration, bilateral, with active choroidal neovascularization: Secondary | ICD-10-CM

## 2016-10-26 ENCOUNTER — Ambulatory Visit (INDEPENDENT_AMBULATORY_CARE_PROVIDER_SITE_OTHER): Payer: Medicare Other

## 2016-10-26 DIAGNOSIS — Z5181 Encounter for therapeutic drug level monitoring: Secondary | ICD-10-CM

## 2016-10-26 DIAGNOSIS — I4891 Unspecified atrial fibrillation: Secondary | ICD-10-CM

## 2016-10-26 DIAGNOSIS — I481 Persistent atrial fibrillation: Secondary | ICD-10-CM | POA: Diagnosis not present

## 2016-10-26 DIAGNOSIS — I4819 Other persistent atrial fibrillation: Secondary | ICD-10-CM

## 2016-10-26 LAB — POCT INR: INR: 2.6

## 2016-11-04 ENCOUNTER — Encounter (INDEPENDENT_AMBULATORY_CARE_PROVIDER_SITE_OTHER): Payer: Medicare Other | Admitting: Ophthalmology

## 2016-11-04 DIAGNOSIS — H35033 Hypertensive retinopathy, bilateral: Secondary | ICD-10-CM | POA: Diagnosis not present

## 2016-11-04 DIAGNOSIS — H43813 Vitreous degeneration, bilateral: Secondary | ICD-10-CM | POA: Diagnosis not present

## 2016-11-04 DIAGNOSIS — I10 Essential (primary) hypertension: Secondary | ICD-10-CM

## 2016-11-04 DIAGNOSIS — H353231 Exudative age-related macular degeneration, bilateral, with active choroidal neovascularization: Secondary | ICD-10-CM | POA: Diagnosis not present

## 2016-11-28 ENCOUNTER — Encounter (INDEPENDENT_AMBULATORY_CARE_PROVIDER_SITE_OTHER): Payer: Medicare Other | Admitting: Ophthalmology

## 2016-11-28 DIAGNOSIS — H43813 Vitreous degeneration, bilateral: Secondary | ICD-10-CM | POA: Diagnosis not present

## 2016-11-28 DIAGNOSIS — I10 Essential (primary) hypertension: Secondary | ICD-10-CM

## 2016-11-28 DIAGNOSIS — H353231 Exudative age-related macular degeneration, bilateral, with active choroidal neovascularization: Secondary | ICD-10-CM | POA: Diagnosis not present

## 2016-11-28 DIAGNOSIS — H35033 Hypertensive retinopathy, bilateral: Secondary | ICD-10-CM | POA: Diagnosis not present

## 2016-12-02 ENCOUNTER — Encounter: Payer: Self-pay | Admitting: Cardiovascular Disease

## 2016-12-13 ENCOUNTER — Encounter: Payer: Self-pay | Admitting: Cardiovascular Disease

## 2016-12-13 ENCOUNTER — Ambulatory Visit: Payer: Medicare Other | Admitting: Cardiovascular Disease

## 2016-12-13 ENCOUNTER — Ambulatory Visit (INDEPENDENT_AMBULATORY_CARE_PROVIDER_SITE_OTHER): Payer: Medicare Other | Admitting: *Deleted

## 2016-12-13 ENCOUNTER — Encounter (INDEPENDENT_AMBULATORY_CARE_PROVIDER_SITE_OTHER): Payer: Self-pay

## 2016-12-13 VITALS — BP 130/75 | HR 60 | Ht 62.0 in | Wt 149.0 lb

## 2016-12-13 DIAGNOSIS — I4819 Other persistent atrial fibrillation: Secondary | ICD-10-CM

## 2016-12-13 DIAGNOSIS — Z5181 Encounter for therapeutic drug level monitoring: Secondary | ICD-10-CM

## 2016-12-13 DIAGNOSIS — I2510 Atherosclerotic heart disease of native coronary artery without angina pectoris: Secondary | ICD-10-CM

## 2016-12-13 DIAGNOSIS — I481 Persistent atrial fibrillation: Secondary | ICD-10-CM | POA: Diagnosis not present

## 2016-12-13 DIAGNOSIS — I4891 Unspecified atrial fibrillation: Secondary | ICD-10-CM | POA: Diagnosis not present

## 2016-12-13 LAB — POCT INR: INR: 1.8

## 2016-12-13 NOTE — Progress Notes (Signed)
Cardiology Office Note   Date:  12/13/2016   ID:  Otis Dialsrma D Oleski, DOB 05/14/1928, MRN [id number]  PCP:  Eustaquio BoydenGutierrez, Javier, MD  Cardiologist: Cassell Clementhomas Brackbill MD --->  Rue Tinnel   Chief Complaint  Patient presents with  . Coronary Artery Disease  . Atrial Fibrillation   Problem list 1. Coronary artery disease-status post coronary artery bypass grafting 2. Atrial fibrillation - persistent  3. Hyperlipidemia    Previous notes from Kathrine CordsBrackbill   Masyn D Hickson is a 83 y.o. female who presents for scheduled follow-up visit  This pleasant 83 year old woman is seen for a scheduled four-month followup office visit. She has a history of ischemic heart disease. She has had previous CABG. [name] been in chronic atrial fibrillation. She underwent cardioversion on 10/23/09 but 1 week later was back in atrial fibrillation. We have made no further attempts at cardioversion. She is essentially asymptomatic with her rhythm. She has a history of hypercholesterolemia and is on over-the-counter preparations. We are checking lipids today. Since last visit she has had no new cardiac symptoms. She remains on long-term Coumadin. Her post herpetic neuralgia pain is improved. She has osteoarthritis of the knees. She does not take anything for it. I suggested that she try some generic Tylenol Since her last visit she has not been experiencing any TIA or stroke symptoms.  No increased exertional dyspnea.  No chest pain.  Nov. 13, 2017:  Alexxa is seen today for initial visit .   Seen with husband, [name].   [name]'s older sister.  No CP or dyspnea . Is in chronic atrial fib.   She is completely asymptomatic.     Jun 13, 2016:    Seen back today for follow up HTN BP is elevated.   December 13, 2016: Ms. Earlene PlaterDavis is seen back today for follow-up visit of her coronary artery disease, hypertension, atrial fibrillation. No CP or dyspnea Having some eye issues.   Getting laser surgery   Past Medical History:    Diagnosis Date  . AF (atrial fibrillation) (HCC)    coumadin  . Arthritis   . Coronary artery disease 2002   s/p CABG  . Diverticulosis   . Dyslipidemia   . History of chicken pox   . IHD (ischemic heart disease)   . Post herpetic neuralgia 02/2012    Past Surgical History:  Procedure Laterality Date  . BREAST LUMPECTOMY     left  . CATARACT EXTRACTION  2013   bilateral  . CORONARY ARTERY BYPASS GRAFT  2002   x 4     Current Outpatient Medications  Medication Sig Dispense Refill  . aspirin 81 MG tablet Take 81 mg by mouth daily.      Marland Kitchen. atenolol (TENORMIN) 25 MG tablet Take 1 tablet by mouth daily 90 tablet 3  . fish oil-omega-3 fatty acids 1000 MG capsule Take 1 capsule by mouth 2 (two) times daily.     Marland Kitchen. losartan (COZAAR) 50 MG tablet Take 1 tablet (50 mg total) by mouth daily. 90 tablet 3  . Multiple Vitamins-Minerals (WOMENS MULTI VITAMIN & MINERAL) TABS Take 1 tablet by mouth daily.    . nitroGLYCERIN (NITROSTAT) 0.4 MG SL tablet Place 0.4 mg under the tongue every 5 (five) minutes as needed for chest pain (for chest pain).    . Red Yeast Rice Extract (RED YEAST RICE PO) Take by mouth 2 (two) times daily.      Marland Kitchen. warfarin (COUMADIN) 5 MG tablet Take as directed  by anticoagulation clinic 90 tablet 0   No current facility-administered medications for this visit.     Allergies:   Crestor [rosuvastatin calcium]; Lipitor [atorvastatin calcium]; Niaspan [niacin er]; Other; Pravastatin; Ramipril [ramipril]; Welchol [colesevelam hcl]; and Zetia [ezetimibe]    Social History:  The patient  reports that  has never smoked. she has never used smokeless tobacco. She reports that she does not drink alcohol or use drugs.   Family History:  The patient's family history includes CAD (age of onset: 3074) in her father; CAD (age of onset: 8390) in her mother.    ROS:  Please see the history of present illness.   Otherwise, review of systems are positive for none.   All other systems are  reviewed and negative.   Physical Exam: Blood pressure 130/75, pulse 60, height 5\' 2"  (1.575 m), weight 149 lb (67.6 kg), SpO2 96 %.  GEN: Elderly female.  No acute distress HEENT: Normal NECK: No JVD; No carotid bruits LYMPHATICS: No lymphadenopathy CARDIAC: Irreg. Irreg , no murmurs, rubs, gallops RESPIRATORY:  Clear to auscultation without rales, wheezing or rhonchi  ABDOMEN: Soft, non-tender, non-distended MUSCULOSKELETAL:  No edema; No deformity  SKIN: Warm and dry NEUROLOGIC:  Alert and oriented x 3   EKG: December 13, 2016: Atrial fibrillation at 61 beats a minute.  Recent Labs: 09/01/2016: BUN 22; Creatinine, Ser 0.91; Hemoglobin 13.5; Platelets 237.0; Potassium 4.5; Sodium 141    Lipid Panel    Component Value Date/Time   CHOL 174 09/01/2016 1102   TRIG 64.0 09/01/2016 1102   HDL 50.20 09/01/2016 1102   CHOLHDL 3 09/01/2016 1102   VLDL 12.8 09/01/2016 1102   LDLCALC 111 (H) 09/01/2016 1102   LDLDIRECT 147.5 12/17/2012 0920      Wt Readings from Last 3 Encounters:  12/13/16 149 lb (67.6 kg)  09/21/16 154 lb 4 oz (70 kg)  09/01/16 153 lb (69.4 kg)        ASSESSMENT AND PLAN:  1. Ischemic heart disease status post CABG -  . No angina , no dyspnea  2. permanent atrial fibrillation-   Rate is well controlled  Tolerate the A. fib quite well.  Continue Coumadin.  3. Hypercholesterolemia- intolerent to statins  Doing ok .    4.  HTN:     BP  Well controlled     Kristeen MissPhilip Adley Mazurowski, MD  12/13/2016 4:29 PM    [facility] [address],  [address] [place], [place]  [address] Pager [phone] Phone: [phone]; Fax: [phone]

## 2016-12-13 NOTE — Patient Instructions (Signed)
Today take 1.5 tablets, then Continue taking 1 tablet every day except 1/2 tablet on Mondays, Wednesdays, and Fridays. Recheck INR in 4 weeks.

## 2016-12-13 NOTE — Patient Instructions (Signed)

## 2017-01-06 ENCOUNTER — Other Ambulatory Visit: Payer: Self-pay | Admitting: *Deleted

## 2017-01-06 MED ORDER — WARFARIN SODIUM 5 MG PO TABS: ORAL_TABLET | ORAL | 0 refills | Status: DC

## 2017-01-09 ENCOUNTER — Encounter (INDEPENDENT_AMBULATORY_CARE_PROVIDER_SITE_OTHER): Payer: Medicare Other | Admitting: Ophthalmology

## 2017-01-09 DIAGNOSIS — I10 Essential (primary) hypertension: Secondary | ICD-10-CM

## 2017-01-09 DIAGNOSIS — H353231 Exudative age-related macular degeneration, bilateral, with active choroidal neovascularization: Secondary | ICD-10-CM

## 2017-01-09 DIAGNOSIS — H43813 Vitreous degeneration, bilateral: Secondary | ICD-10-CM

## 2017-01-09 DIAGNOSIS — H35033 Hypertensive retinopathy, bilateral: Secondary | ICD-10-CM

## 2017-01-10 ENCOUNTER — Ambulatory Visit (INDEPENDENT_AMBULATORY_CARE_PROVIDER_SITE_OTHER): Payer: Medicare Other | Admitting: Pharmacist

## 2017-01-10 DIAGNOSIS — Z5181 Encounter for therapeutic drug level monitoring: Secondary | ICD-10-CM | POA: Diagnosis not present

## 2017-01-10 DIAGNOSIS — I481 Persistent atrial fibrillation: Secondary | ICD-10-CM

## 2017-01-10 DIAGNOSIS — I4819 Other persistent atrial fibrillation: Secondary | ICD-10-CM

## 2017-01-10 DIAGNOSIS — I4891 Unspecified atrial fibrillation: Secondary | ICD-10-CM

## 2017-01-10 LAB — POCT INR: INR: 1.7

## 2017-01-10 NOTE — Patient Instructions (Signed)
Description   Today take 1.5 tablets, then continue taking 1 tablet every day except 1/2 tablet on Mondays, Wednesdays, and Fridays. Recheck INR in 3 weeks.

## 2017-01-31 ENCOUNTER — Ambulatory Visit (INDEPENDENT_AMBULATORY_CARE_PROVIDER_SITE_OTHER): Payer: Medicare Other | Admitting: *Deleted

## 2017-01-31 DIAGNOSIS — Z5181 Encounter for therapeutic drug level monitoring: Secondary | ICD-10-CM | POA: Diagnosis not present

## 2017-01-31 DIAGNOSIS — I4891 Unspecified atrial fibrillation: Secondary | ICD-10-CM | POA: Diagnosis not present

## 2017-01-31 DIAGNOSIS — I481 Persistent atrial fibrillation: Secondary | ICD-10-CM

## 2017-01-31 DIAGNOSIS — I4819 Other persistent atrial fibrillation: Secondary | ICD-10-CM

## 2017-01-31 LAB — POCT INR: INR: 2.5

## 2017-01-31 NOTE — Patient Instructions (Signed)
Description   Continue taking 1 tablet every day except 1/2 tablet on Mondays, Wednesdays, and Fridays. Recheck INR in 4 weeks but pt wants INR checked when husband has his checked so appt made for 02/07/2017.

## 2017-02-13 ENCOUNTER — Encounter (INDEPENDENT_AMBULATORY_CARE_PROVIDER_SITE_OTHER): Payer: Medicare Other | Admitting: Ophthalmology

## 2017-02-13 DIAGNOSIS — H353231 Exudative age-related macular degeneration, bilateral, with active choroidal neovascularization: Secondary | ICD-10-CM | POA: Diagnosis not present

## 2017-02-13 DIAGNOSIS — H35033 Hypertensive retinopathy, bilateral: Secondary | ICD-10-CM

## 2017-02-13 DIAGNOSIS — H43813 Vitreous degeneration, bilateral: Secondary | ICD-10-CM

## 2017-02-13 DIAGNOSIS — I10 Essential (primary) hypertension: Secondary | ICD-10-CM

## 2017-02-15 ENCOUNTER — Ambulatory Visit (INDEPENDENT_AMBULATORY_CARE_PROVIDER_SITE_OTHER): Payer: Medicare Other | Admitting: *Deleted

## 2017-02-15 DIAGNOSIS — I481 Persistent atrial fibrillation: Secondary | ICD-10-CM | POA: Diagnosis not present

## 2017-02-15 DIAGNOSIS — I4891 Unspecified atrial fibrillation: Secondary | ICD-10-CM | POA: Diagnosis not present

## 2017-02-15 DIAGNOSIS — Z5181 Encounter for therapeutic drug level monitoring: Secondary | ICD-10-CM

## 2017-02-15 DIAGNOSIS — I4819 Other persistent atrial fibrillation: Secondary | ICD-10-CM

## 2017-02-15 LAB — POCT INR: INR: 2.5

## 2017-02-15 NOTE — Patient Instructions (Signed)
Description   Continue taking 1 tablet every day except 1/2 tablet on Mondays, Wednesdays, and Fridays. Recheck INR in 6 weeks. Call us with any medication changes or concerns # [phone].

## 2017-03-13 ENCOUNTER — Encounter (INDEPENDENT_AMBULATORY_CARE_PROVIDER_SITE_OTHER): Payer: Medicare Other | Admitting: Ophthalmology

## 2017-03-13 DIAGNOSIS — I10 Essential (primary) hypertension: Secondary | ICD-10-CM

## 2017-03-13 DIAGNOSIS — H353231 Exudative age-related macular degeneration, bilateral, with active choroidal neovascularization: Secondary | ICD-10-CM

## 2017-03-13 DIAGNOSIS — H35033 Hypertensive retinopathy, bilateral: Secondary | ICD-10-CM | POA: Diagnosis not present

## 2017-03-13 DIAGNOSIS — H43813 Vitreous degeneration, bilateral: Secondary | ICD-10-CM

## 2017-03-29 ENCOUNTER — Other Ambulatory Visit: Payer: Self-pay

## 2017-03-29 ENCOUNTER — Ambulatory Visit (INDEPENDENT_AMBULATORY_CARE_PROVIDER_SITE_OTHER): Payer: Medicare Other | Admitting: *Deleted

## 2017-03-29 DIAGNOSIS — Z5181 Encounter for therapeutic drug level monitoring: Secondary | ICD-10-CM | POA: Diagnosis not present

## 2017-03-29 DIAGNOSIS — I481 Persistent atrial fibrillation: Secondary | ICD-10-CM | POA: Diagnosis not present

## 2017-03-29 DIAGNOSIS — I4891 Unspecified atrial fibrillation: Secondary | ICD-10-CM

## 2017-03-29 DIAGNOSIS — I4819 Other persistent atrial fibrillation: Secondary | ICD-10-CM

## 2017-03-29 LAB — POCT INR: INR: 2.6

## 2017-03-29 MED ORDER — ATENOLOL 25 MG PO TABS: ORAL_TABLET | ORAL | 1 refills | Status: DC

## 2017-03-29 NOTE — Patient Instructions (Signed)
Description   Continue taking 1 tablet every day except 1/2 tablet on Mondays, Wednesdays, and Fridays. Recheck INR in 6 weeks. Call us with any medication changes or concerns # [phone].

## 2017-04-10 ENCOUNTER — Encounter (INDEPENDENT_AMBULATORY_CARE_PROVIDER_SITE_OTHER): Payer: Medicare Other | Admitting: Ophthalmology

## 2017-04-10 DIAGNOSIS — H35033 Hypertensive retinopathy, bilateral: Secondary | ICD-10-CM | POA: Diagnosis not present

## 2017-04-10 DIAGNOSIS — I10 Essential (primary) hypertension: Secondary | ICD-10-CM

## 2017-04-10 DIAGNOSIS — H43813 Vitreous degeneration, bilateral: Secondary | ICD-10-CM | POA: Diagnosis not present

## 2017-04-10 DIAGNOSIS — H353231 Exudative age-related macular degeneration, bilateral, with active choroidal neovascularization: Secondary | ICD-10-CM | POA: Diagnosis not present

## 2017-05-08 ENCOUNTER — Encounter (INDEPENDENT_AMBULATORY_CARE_PROVIDER_SITE_OTHER): Payer: Medicare Other | Admitting: Ophthalmology

## 2017-05-08 DIAGNOSIS — H353231 Exudative age-related macular degeneration, bilateral, with active choroidal neovascularization: Secondary | ICD-10-CM

## 2017-05-08 DIAGNOSIS — H43813 Vitreous degeneration, bilateral: Secondary | ICD-10-CM

## 2017-05-08 DIAGNOSIS — I10 Essential (primary) hypertension: Secondary | ICD-10-CM

## 2017-05-08 DIAGNOSIS — H35033 Hypertensive retinopathy, bilateral: Secondary | ICD-10-CM

## 2017-05-10 ENCOUNTER — Ambulatory Visit (INDEPENDENT_AMBULATORY_CARE_PROVIDER_SITE_OTHER): Payer: Medicare Other | Admitting: *Deleted

## 2017-05-10 DIAGNOSIS — I4891 Unspecified atrial fibrillation: Secondary | ICD-10-CM | POA: Diagnosis not present

## 2017-05-10 DIAGNOSIS — I481 Persistent atrial fibrillation: Secondary | ICD-10-CM | POA: Diagnosis not present

## 2017-05-10 DIAGNOSIS — I4819 Other persistent atrial fibrillation: Secondary | ICD-10-CM

## 2017-05-10 DIAGNOSIS — Z5181 Encounter for therapeutic drug level monitoring: Secondary | ICD-10-CM | POA: Diagnosis not present

## 2017-05-10 LAB — POCT INR: INR: 2

## 2017-05-10 NOTE — Patient Instructions (Signed)
Description   Continue taking 1 tablet every day except 1/2 tablet on Mondays, Wednesdays, and Fridays. Recheck INR in 6 weeks. Call us with any medication changes or concerns # [phone].     

## 2017-05-25 ENCOUNTER — Encounter: Payer: Self-pay | Admitting: Family Medicine

## 2017-05-30 ENCOUNTER — Other Ambulatory Visit: Payer: Self-pay | Admitting: Cardiovascular Disease

## 2017-06-12 ENCOUNTER — Encounter (INDEPENDENT_AMBULATORY_CARE_PROVIDER_SITE_OTHER): Payer: Medicare Other | Admitting: Ophthalmology

## 2017-06-12 DIAGNOSIS — H35033 Hypertensive retinopathy, bilateral: Secondary | ICD-10-CM

## 2017-06-12 DIAGNOSIS — H353231 Exudative age-related macular degeneration, bilateral, with active choroidal neovascularization: Secondary | ICD-10-CM

## 2017-06-12 DIAGNOSIS — I10 Essential (primary) hypertension: Secondary | ICD-10-CM | POA: Diagnosis not present

## 2017-06-12 DIAGNOSIS — H43813 Vitreous degeneration, bilateral: Secondary | ICD-10-CM | POA: Diagnosis not present

## 2017-07-04 ENCOUNTER — Telehealth: Payer: Self-pay | Admitting: Cardiovascular Disease

## 2017-07-04 NOTE — Telephone Encounter (Signed)
Spoke with patient's niece and gave her information on alternative blood thinning medications other than coumadin. I advised her to contact their insurance company for cost information and then to let us know how they would like to proceed. Patient has CVRR appointment on 6/19; niece states she will either call me back or will discuss with pharmacist at that appointment. She thanked me for my help.   

## 2017-07-04 NOTE — Telephone Encounter (Signed)
New Message: ° ° ° ° ° °Pt's niece is calling and states the pt would like to be on a different blood thinner instead of the coumadin. °

## 2017-07-21 ENCOUNTER — Encounter (INDEPENDENT_AMBULATORY_CARE_PROVIDER_SITE_OTHER): Payer: Medicare Other | Admitting: Ophthalmology

## 2017-07-21 DIAGNOSIS — H43813 Vitreous degeneration, bilateral: Secondary | ICD-10-CM

## 2017-07-21 DIAGNOSIS — H353231 Exudative age-related macular degeneration, bilateral, with active choroidal neovascularization: Secondary | ICD-10-CM | POA: Diagnosis not present

## 2017-07-26 ENCOUNTER — Encounter: Payer: Self-pay | Admitting: Cardiovascular Disease

## 2017-07-26 ENCOUNTER — Ambulatory Visit (INDEPENDENT_AMBULATORY_CARE_PROVIDER_SITE_OTHER): Payer: Medicare Other | Admitting: *Deleted

## 2017-07-26 ENCOUNTER — Ambulatory Visit: Payer: Medicare Other | Admitting: Cardiovascular Disease

## 2017-07-26 VITALS — BP 138/66 | HR 103 | Ht 62.0 in | Wt 148.8 lb

## 2017-07-26 DIAGNOSIS — I2510 Atherosclerotic heart disease of native coronary artery without angina pectoris: Secondary | ICD-10-CM | POA: Diagnosis not present

## 2017-07-26 DIAGNOSIS — Z5181 Encounter for therapeutic drug level monitoring: Secondary | ICD-10-CM

## 2017-07-26 DIAGNOSIS — I481 Persistent atrial fibrillation: Secondary | ICD-10-CM | POA: Diagnosis not present

## 2017-07-26 DIAGNOSIS — I4819 Other persistent atrial fibrillation: Secondary | ICD-10-CM

## 2017-07-26 DIAGNOSIS — I10 Essential (primary) hypertension: Secondary | ICD-10-CM

## 2017-07-26 LAB — POCT INR: INR: 1.7 — AB (ref 2.0–3.0)

## 2017-07-26 NOTE — Patient Instructions (Signed)
Description   Today take 1 tablet, then Continue taking 1 tablet every day except 1/2 tablet on Mondays, Wednesdays, and Fridays. Recheck INR in 2 weeks. Call us with any medication changes or concerns # [phone].

## 2017-07-26 NOTE — Progress Notes (Signed)
Cardiology Office Note   Date:  07/26/2017   ID:  Ellen Barajas, DOB 03/29/1928, MRN [id number]  PCP:  Ellen Barajas, Javier, MD  Cardiologist: Ellen Clementhomas Brackbill MD --->  Nahser   Chief Complaint  Patient presents with  . Coronary Artery Disease  . Atrial Fibrillation   Problem list 1. Coronary artery disease-status post coronary artery bypass grafting 2. Atrial fibrillation - persistent  3. Hyperlipidemia    Previous notes from Ellen Barajas   Ellen Barajas is a 84 y.o. female who presents for scheduled follow-up visit  This pleasant 84 year old woman is seen for a scheduled four-month followup office visit. She has a history of ischemic heart disease. She has had previous CABG. [name] been in chronic atrial fibrillation. She underwent cardioversion on 10/23/09 but 1 week later was back in atrial fibrillation. We have made no further attempts at cardioversion. She is essentially asymptomatic with her rhythm. She has a history of hypercholesterolemia and is on over-the-counter preparations. We are checking lipids today. Since last visit she has had no new cardiac symptoms. She remains on long-term Coumadin. Her post herpetic neuralgia pain is improved. She has osteoarthritis of the knees. She does not take anything for it. I suggested that she try some generic Tylenol Since her last visit she has not been experiencing any TIA or stroke symptoms.  No increased exertional dyspnea.  No chest pain.  Nov. 13, 2017:  Ellen Barajas is seen today for initial visit .   Seen with husband, Ellen Barajas.   Ellen Barajas's older sister.  No CP or dyspnea . Is in chronic atrial fib.   She is completely asymptomatic.     Jun 13, 2016:    Seen back today for follow up HTN BP is elevated.   December 13, 2016: Ms. Ellen Barajas is seen back today for follow-up visit of her coronary artery disease, hypertension, atrial fibrillation. No CP or dyspnea Having some eye issues.   Getting laser surgery   July 26, 2017:  Ellen Barajas is   seen today for a follow-up visit.  Has a history of coronary artery disease and is status post coronary artery bypass grafting.  History of atrial fibrillation and is on warfarin. She [facility]   Plans on going home when she can  Doing great.    Past Medical History:  Diagnosis Date  . AF (atrial fibrillation) (HCC)    coumadin  . Arthritis   . Coronary artery disease 2002   s/p CABG  . Diverticulosis   . Dyslipidemia   . History of chicken pox   . IHD (ischemic heart disease)   . Post herpetic neuralgia 02/2012    Past Surgical History:  Procedure Laterality Date  . BREAST LUMPECTOMY     left  . CATARACT EXTRACTION  2013   bilateral  . CORONARY ARTERY BYPASS GRAFT  2002   x 4     Current Outpatient Medications  Medication Sig Dispense Refill  . aspirin 81 MG tablet Take 81 mg by mouth daily.      Marland Kitchen. atenolol (TENORMIN) 25 MG tablet Take 1 tablet by mouth daily 90 tablet 1  . fish oil-omega-3 fatty acids 1000 MG capsule Take 1 capsule by mouth 2 (two) times daily.     Marland Kitchen. losartan (COZAAR) 50 MG tablet TAKE 1 TABLET BY MOUTH ONCE DAILY 90 tablet 2  . Multiple Vitamins-Minerals (WOMENS MULTI VITAMIN & MINERAL) TABS Take 1 tablet by mouth daily.    .Marland Kitchen  nitroGLYCERIN (NITROSTAT) 0.4 MG SL tablet Place 0.4 mg under the tongue every 5 (five) minutes as needed for chest pain (for chest pain).    . Red Yeast Rice Extract (RED YEAST RICE PO) Take by mouth 2 (two) times daily.      Marland Kitchen warfarin (COUMADIN) 5 MG tablet Take as directed by anticoagulation clinic 90 tablet 0   No current facility-administered medications for this visit.     Allergies:   Crestor [rosuvastatin calcium]; Lipitor [atorvastatin calcium]; Niaspan [niacin er]; Other; Pravastatin; Ramipril [ramipril]; Welchol [colesevelam hcl]; and Zetia [ezetimibe]    Social History:  The patient  reports that she has never smoked. She has never used smokeless tobacco. She reports that she does  not drink alcohol or use drugs.   Family History:  The patient's family history includes CAD (age of onset: 35) in her father; CAD (age of onset: 51) in her mother.    ROS:  Please see the history of present illness.   Otherwise, review of systems are positive for none.   All other systems are reviewed and negative.   Physical Exam: Blood pressure 138/66, pulse (!) 103, height 5\' 2"  (1.575 m), weight 148 lb 12.8 oz (67.5 kg), SpO2 98 %.  GEN:  Well nourished, well developed in no acute distress HEENT: Normal NECK: No JVD; No carotid bruits LYMPHATICS: No lymphadenopathy CARDIAC:   Irreg. Irreg. , no murmurs, rubs, gallops RESPIRATORY:  Clear to auscultation without rales, wheezing or rhonchi  ABDOMEN: Soft, non-tender, non-distended MUSCULOSKELETAL:  No edema; No deformity  SKIN: Warm and dry NEUROLOGIC:  Alert and oriented x 3   EKG: .  Recent Labs: 09/01/2016: BUN 22; Creatinine, Ser 0.91; Hemoglobin 13.5; Platelets 237.0; Potassium 4.5; Sodium 141    Lipid Panel    Component Value Date/Time   CHOL 174 09/01/2016 1102   TRIG 64.0 09/01/2016 1102   HDL 50.20 09/01/2016 1102   CHOLHDL 3 09/01/2016 1102   VLDL 12.8 09/01/2016 1102   LDLCALC 111 (H) 09/01/2016 1102   LDLDIRECT 147.5 12/17/2012 0920      Wt Readings from Last 3 Encounters:  07/26/17 148 lb 12.8 oz (67.5 kg)  12/13/16 149 lb (67.6 kg)  09/21/16 154 lb 4 oz (70 kg)        ASSESSMENT AND PLAN:  1. Ischemic heart disease status post CABG -  .  No angina    Doing great    2. permanent atrial fibrillation-    On coumadin   3. Hypercholesterolemia-    Stable    4.  HTN:         Ellen Miss, MD  07/26/2017 5:33 PM    [facility] [address] [place],  [address] [place], [place]  [address] Pager [phone] Phone: [phone]; Fax: [phone]

## 2017-07-26 NOTE — Patient Instructions (Signed)
Medication Instructions:  Your physician recommends that you continue on your current medications as directed. Please refer to the Current Medication list given to you today.   Labwork: None Ordered   Testing/Procedures: None Ordered   Follow-Up: Your physician wants you to follow-up in: 6 months with Dr. Nahser.  You will receive a reminder letter in the mail two months in advance. If you don't receive a letter, please call our office to schedule the follow-up appointment.   If you need a refill on your cardiac medications before your next appointment, please call your pharmacy.   Thank you for choosing [facility]! Hadley Soileau, RN [phone]    

## 2017-08-16 ENCOUNTER — Telehealth: Payer: Self-pay | Admitting: *Deleted

## 2017-08-16 NOTE — Telephone Encounter (Signed)
Spoke with pt because received a refill request for her Warfarin/Coumadin; pt is overdue for an appt and states she will call back for an appt after consulting her niece for transportation. Gave her the CVRR number and advised to update us soon so we can fill her RX soon.

## 2017-08-22 ENCOUNTER — Encounter (INDEPENDENT_AMBULATORY_CARE_PROVIDER_SITE_OTHER): Payer: Self-pay

## 2017-08-22 ENCOUNTER — Ambulatory Visit: Payer: Medicare Other | Admitting: *Deleted

## 2017-08-22 DIAGNOSIS — I4819 Other persistent atrial fibrillation: Secondary | ICD-10-CM

## 2017-08-22 DIAGNOSIS — I481 Persistent atrial fibrillation: Secondary | ICD-10-CM

## 2017-08-22 DIAGNOSIS — Z5181 Encounter for therapeutic drug level monitoring: Secondary | ICD-10-CM

## 2017-08-22 LAB — POCT INR: INR: 2.4 (ref 2.0–3.0)

## 2017-08-22 MED ORDER — WARFARIN SODIUM 5 MG PO TABS: ORAL_TABLET | ORAL | 0 refills | Status: DC

## 2017-08-22 NOTE — Patient Instructions (Signed)
Description   Continue taking 1 tablet every day except 1/2 tablet on Mondays, Wednesdays, and Fridays. Recheck INR in 4 weeks. Call us with any medication changes or concerns # [phone].

## 2017-08-31 ENCOUNTER — Encounter (INDEPENDENT_AMBULATORY_CARE_PROVIDER_SITE_OTHER): Payer: Medicare Other | Admitting: Ophthalmology

## 2017-08-31 DIAGNOSIS — I10 Essential (primary) hypertension: Secondary | ICD-10-CM | POA: Diagnosis not present

## 2017-08-31 DIAGNOSIS — H43813 Vitreous degeneration, bilateral: Secondary | ICD-10-CM | POA: Diagnosis not present

## 2017-08-31 DIAGNOSIS — H35033 Hypertensive retinopathy, bilateral: Secondary | ICD-10-CM

## 2017-08-31 DIAGNOSIS — H33301 Unspecified retinal break, right eye: Secondary | ICD-10-CM | POA: Diagnosis not present

## 2017-08-31 DIAGNOSIS — H353231 Exudative age-related macular degeneration, bilateral, with active choroidal neovascularization: Secondary | ICD-10-CM | POA: Diagnosis not present

## 2017-09-19 ENCOUNTER — Ambulatory Visit (INDEPENDENT_AMBULATORY_CARE_PROVIDER_SITE_OTHER): Payer: Medicare Other | Admitting: *Deleted

## 2017-09-19 DIAGNOSIS — I4819 Other persistent atrial fibrillation: Secondary | ICD-10-CM

## 2017-09-19 DIAGNOSIS — Z5181 Encounter for therapeutic drug level monitoring: Secondary | ICD-10-CM | POA: Diagnosis not present

## 2017-09-19 DIAGNOSIS — I481 Persistent atrial fibrillation: Secondary | ICD-10-CM

## 2017-09-19 LAB — POCT INR: INR: 3.6 — AB (ref 2.0–3.0)

## 2017-09-19 NOTE — Patient Instructions (Signed)
Description   Skip today's dose, then Continue taking 1 tablet every day except 1/2 tablet on Mondays, Wednesdays, and Fridays. Recheck INR in 2 weeks. Call us with any medication changes or concerns # [phone].

## 2017-09-20 ENCOUNTER — Ambulatory Visit: Payer: Medicare Other

## 2017-09-27 ENCOUNTER — Other Ambulatory Visit: Payer: Self-pay

## 2017-09-27 ENCOUNTER — Encounter (HOSPITAL_COMMUNITY): Payer: Self-pay | Admitting: Emergency Medicine

## 2017-09-27 ENCOUNTER — Emergency Department (HOSPITAL_COMMUNITY): Payer: Medicare Other

## 2017-09-27 ENCOUNTER — Inpatient Hospital Stay (HOSPITAL_COMMUNITY)
Admission: EM | Admit: 2017-09-27 | Discharge: 2017-10-03 | DRG: 481 | Disposition: A | Discharge status: Skilled Nursing Facility | Payer: Medicare Other | Attending: Internal Medicine | Admitting: Internal Medicine

## 2017-09-27 ENCOUNTER — Encounter: Payer: Medicare Other | Admitting: Family Medicine

## 2017-09-27 DIAGNOSIS — Y9389 Activity, other specified: Secondary | ICD-10-CM

## 2017-09-27 DIAGNOSIS — I481 Persistent atrial fibrillation: Secondary | ICD-10-CM | POA: Diagnosis not present

## 2017-09-27 DIAGNOSIS — Z7901 Long term (current) use of anticoagulants: Secondary | ICD-10-CM

## 2017-09-27 DIAGNOSIS — R0902 Hypoxemia: Secondary | ICD-10-CM | POA: Diagnosis not present

## 2017-09-27 DIAGNOSIS — S72002A Fracture of unspecified part of neck of left femur, initial encounter for closed fracture: Secondary | ICD-10-CM

## 2017-09-27 DIAGNOSIS — I10 Essential (primary) hypertension: Secondary | ICD-10-CM | POA: Diagnosis not present

## 2017-09-27 DIAGNOSIS — Z419 Encounter for procedure for purposes other than remedying health state, unspecified: Secondary | ICD-10-CM

## 2017-09-27 DIAGNOSIS — I252 Old myocardial infarction: Secondary | ICD-10-CM

## 2017-09-27 DIAGNOSIS — I259 Chronic ischemic heart disease, unspecified: Secondary | ICD-10-CM | POA: Diagnosis not present

## 2017-09-27 DIAGNOSIS — R52 Pain, unspecified: Secondary | ICD-10-CM

## 2017-09-27 DIAGNOSIS — I2510 Atherosclerotic heart disease of native coronary artery without angina pectoris: Secondary | ICD-10-CM | POA: Diagnosis present

## 2017-09-27 DIAGNOSIS — M2550 Pain in unspecified joint: Secondary | ICD-10-CM | POA: Diagnosis not present

## 2017-09-27 DIAGNOSIS — Z7401 Bed confinement status: Secondary | ICD-10-CM | POA: Diagnosis not present

## 2017-09-27 DIAGNOSIS — S72142A Displaced intertrochanteric fracture of left femur, initial encounter for closed fracture: Secondary | ICD-10-CM

## 2017-09-27 DIAGNOSIS — I4811 Longstanding persistent atrial fibrillation: Secondary | ICD-10-CM | POA: Diagnosis present

## 2017-09-27 DIAGNOSIS — Z66 Do not resuscitate: Secondary | ICD-10-CM | POA: Diagnosis not present

## 2017-09-27 DIAGNOSIS — E785 Hyperlipidemia, unspecified: Secondary | ICD-10-CM | POA: Diagnosis present

## 2017-09-27 DIAGNOSIS — W010XXA Fall on same level from slipping, tripping and stumbling without subsequent striking against object, initial encounter: Secondary | ICD-10-CM | POA: Diagnosis present

## 2017-09-27 DIAGNOSIS — Z951 Presence of aortocoronary bypass graft: Secondary | ICD-10-CM

## 2017-09-27 DIAGNOSIS — W19XXXA Unspecified fall, initial encounter: Secondary | ICD-10-CM | POA: Diagnosis not present

## 2017-09-27 DIAGNOSIS — S72009A Fracture of unspecified part of neck of unspecified femur, initial encounter for closed fracture: Secondary | ICD-10-CM | POA: Diagnosis present

## 2017-09-27 DIAGNOSIS — S299XXA Unspecified injury of thorax, initial encounter: Secondary | ICD-10-CM | POA: Diagnosis not present

## 2017-09-27 DIAGNOSIS — Z79899 Other long term (current) drug therapy: Secondary | ICD-10-CM | POA: Diagnosis not present

## 2017-09-27 DIAGNOSIS — Z7982 Long term (current) use of aspirin: Secondary | ICD-10-CM

## 2017-09-27 DIAGNOSIS — T148XXA Other injury of unspecified body region, initial encounter: Secondary | ICD-10-CM

## 2017-09-27 DIAGNOSIS — S72142D Displaced intertrochanteric fracture of left femur, subsequent encounter for closed fracture with routine healing: Secondary | ICD-10-CM | POA: Diagnosis not present

## 2017-09-27 DIAGNOSIS — Y92512 Supermarket, store or market as the place of occurrence of the external cause: Secondary | ICD-10-CM

## 2017-09-27 DIAGNOSIS — M791 Myalgia, unspecified site: Secondary | ICD-10-CM | POA: Diagnosis not present

## 2017-09-27 DIAGNOSIS — I4819 Other persistent atrial fibrillation: Secondary | ICD-10-CM | POA: Diagnosis present

## 2017-09-27 DIAGNOSIS — I959 Hypotension, unspecified: Secondary | ICD-10-CM | POA: Diagnosis not present

## 2017-09-27 HISTORY — DX: Acute myocardial infarction, unspecified: I21.9

## 2017-09-27 LAB — CBC WITH DIFFERENTIAL/PLATELET
Abs Immature Granulocytes: 0 10*3/uL (ref 0.0–0.1)
BASOS PCT: 0 %
Basophils Absolute: 0 10*3/uL (ref 0.0–0.1)
EOS ABS: 0.2 10*3/uL (ref 0.0–0.7)
EOS PCT: 2 %
HEMATOCRIT: 41.2 % (ref 36.0–46.0)
Hemoglobin: 12.9 g/dL (ref 12.0–15.0)
Immature Granulocytes: 0 %
LYMPHS ABS: 1 10*3/uL (ref 0.7–4.0)
Lymphocytes Relative: 14 %
MCH: 30 pg (ref 26.0–34.0)
MCHC: 31.3 g/dL (ref 30.0–36.0)
MCV: 95.8 fL (ref 78.0–100.0)
MONO ABS: 0.5 10*3/uL (ref 0.1–1.0)
MONOS PCT: 8 %
NEUTROS PCT: 76 %
Neutro Abs: 5.1 10*3/uL (ref 1.7–7.7)
PLATELETS: 218 10*3/uL (ref 150–400)
RBC: 4.3 MIL/uL (ref 3.87–5.11)
RDW: 14 % (ref 11.5–15.5)
WBC: 6.9 10*3/uL (ref 4.0–10.5)

## 2017-09-27 LAB — BASIC METABOLIC PANEL
ANION GAP: 11 (ref 5–15)
BUN: 17 mg/dL (ref 8–23)
CALCIUM: 9.2 mg/dL (ref 8.9–10.3)
CO2: 23 mmol/L (ref 22–32)
CREATININE: 0.83 mg/dL (ref 0.44–1.00)
Chloride: 108 mmol/L (ref 98–111)
GFR calc Af Amer: >60 mL/min (ref >60)
GFR calc non Af Amer: >60 mL/min (ref >60)
Glucose, Bld: 93 mg/dL (ref 70–99)
Potassium: 4.3 mmol/L (ref 3.5–5.1)
Sodium: 142 mmol/L (ref 135–145)

## 2017-09-27 LAB — PROTIME-INR
INR: 1.58
PROTHROMBIN TIME: 18.7 s — AB (ref 11.4–15.2)

## 2017-09-27 LAB — MRSA PCR SCREENING: MRSA by PCR: NEGATIVE

## 2017-09-27 MED ORDER — ACETAMINOPHEN 500 MG PO TABS
1000.0000 mg | ORAL_TABLET | Freq: Once | ORAL | Status: AC
  Administered 2017-09-27: 1000 mg via ORAL
  Filled 2017-09-27: qty 2

## 2017-09-27 MED ORDER — MORPHINE SULFATE (PF) 2 MG/ML IV SOLN: 0.5000 mg | INTRAVENOUS | Status: DC | PRN

## 2017-09-27 MED ORDER — ONDANSETRON HCL 4 MG/2ML IJ SOLN
4.0000 mg | Freq: Once | INTRAMUSCULAR | Status: AC
  Administered 2017-09-27: 4 mg via INTRAVENOUS
  Filled 2017-09-27: qty 2

## 2017-09-27 MED ORDER — MORPHINE SULFATE (PF) 2 MG/ML IV SOLN
2.0000 mg | Freq: Once | INTRAVENOUS | Status: AC
  Administered 2017-09-27: 2 mg via INTRAVENOUS
  Filled 2017-09-27: qty 1

## 2017-09-27 MED ORDER — HYDROCODONE-ACETAMINOPHEN 5-325 MG PO TABS: 1.0000 | ORAL_TABLET | Freq: Four times a day (QID) | ORAL | Status: DC | PRN

## 2017-09-27 MED ORDER — NITROGLYCERIN 0.4 MG SL SUBL: 0.4000 mg | SUBLINGUAL_TABLET | SUBLINGUAL | Status: DC | PRN

## 2017-09-27 MED ORDER — POLYETHYLENE GLYCOL 3350 17 G PO PACK: 17.0000 g | PACK | Freq: Every day | ORAL | Status: DC | PRN

## 2017-09-27 NOTE — H&P (Signed)
History and Physical  Ellen Barajas:[id number] DOB: 08/27/1928 DOA: 09/27/2017  PCP: Eustaquio Boyden, MD   Chief Complaint: fall  HPI:  84 year old woman PMH atrial fibrillation, CABG, presented after a fall at the grocery store resulting in difficulty moving her left leg.  Imaging revealed left hip fracture.  Patient was in the parking lot putting away her groceries when she turned around and fell to the ground, immediately afterwards she had great difficulty moving her leg although she had no pain.  Despite rest her symptoms did not improve and eventually retired Engineer, civil (consulting) convinced her that she needed to go to the emergency department.  No specific aggravating or alleviating factors noted, again no pain noted.  She is a history of coronary artery disease but has had no recent chest pain or shortness of breath and is quite active.  ED Course: She was treated with Tylenol, morphine, Zofran.  Vitals were stable.  Review of Systems:  Negative for fever, visual changes, sore throat, rash, new muscle aches, chest pain, SOB, dysuria, bleeding, n/v/abdominal pain.  Past Medical History:  Diagnosis Date  . AF (atrial fibrillation) (HCC)    coumadin  . Arthritis   . Coronary artery disease 2002   s/p CABG  . Diverticulosis   . Dyslipidemia   . History of chicken pox   . IHD (ischemic heart disease)   . Post herpetic neuralgia 02/2012    Past Surgical History:  Procedure Laterality Date  . BREAST LUMPECTOMY     left  . CATARACT EXTRACTION  2013   bilateral  . CORONARY ARTERY BYPASS GRAFT  2002   x 4     reports that she has never smoked. She has never used smokeless tobacco. She reports that she does not drink alcohol or use drugs. Mobility: walks with walking stick  Allergies  Allergen Reactions  . Crestor [Rosuvastatin Calcium]   . Lipitor [Atorvastatin Calcium]   . Niaspan [Niacin Er]   . Other     PER PATIENT SHE DOES NOT TOLERATE NARCOTICS  . Pravastatin   . Ramipril  [Ramipril]     Hyperkalemia  . Welchol [Colesevelam Hcl]   . Zetia [Ezetimibe]     Family History  Problem Relation Age of Onset  . CAD Father 51       MI  . CAD Mother 17       MI     Prior to Admission medications   Medication Sig Start Date End Date Taking? Authorizing Provider  aspirin 81 MG tablet Take 81 mg by mouth daily.      [provider]  atenolol (TENORMIN) 25 MG tablet Take 1 tablet by mouth daily 03/29/17   Nahser, Deloris Ping, MD  fish oil-omega-3 fatty acids 1000 MG capsule Take 1 capsule by mouth 2 (two) times daily.     [provider]  losartan (COZAAR) 50 MG tablet TAKE 1 TABLET BY MOUTH ONCE DAILY 05/30/17   Nahser, Deloris Ping, MD  Multiple Vitamins-Minerals (WOMENS MULTI VITAMIN & MINERAL) TABS Take 1 tablet by mouth daily.    [provider]  nitroGLYCERIN (NITROSTAT) 0.4 MG SL tablet Place 0.4 mg under the tongue every 5 (five) minutes as needed for chest pain (for chest pain).    [provider]  Red Yeast Rice Extract (RED YEAST RICE PO) Take by mouth 2 (two) times daily.      [provider]  warfarin (COUMADIN) 5 MG tablet Take as directed by anticoagulation clinic 08/22/17  Nahser, Deloris Ping, MD    Physical Exam: Vitals:   09/27/17 1715 09/27/17 1730  BP: (!) 153/60 (!) 156/60  Pulse: 83 88  Resp: 16 (!) 22  Temp:    SpO2: 99% 99%    Constitutional:   . Appears calm and comfortable Eyes:  . pupils and irises appear normal . Normal lids  ENMT:  . grossly normal hearing  . Lips appear normal Neck:  . neck appears normal, no masses . no thyromegaly Respiratory:  . CTA bilaterally, no w/r/r.  . Respiratory effort normal Cardiovascular:  . RRR, no m/r/g . No LE extremity edema   . Left dorsalis pedis pulse 2+. Abdomen:  . Abdomen appears normal; no tenderness or masses . No hepatomegaly Musculoskeletal:  . Digits/nails BUE: no clubbing, cyanosis, petechiae, infection . RUE, LUE, RLE, LLE    o strength and tone normal, no atrophy, no abnormal movements . Left leg is shortened and externally rotated Skin:  . No rashes, lesions, ulcers . palpation of skin: no induration or nodules Neurologic:  . Sensation in left leg intact. Psychiatric:  . Mental status o Mood, affect appropriate . judgment and insight appear intact   I have personally reviewed following labs and imaging studies  Labs:   Basic metabolic panel unremarkable  CBC unremarkable  INR 1.58  Imaging studies:   Chest x-ray independently reviewed, no acute disease  Medical tests:   EKG independently reviewed: afib, no acute changes    Active Problems:   Persistent atrial fibrillation (HCC)   IHD (ischemic heart disease)   Hx of CABG   Hip fracture (HCC)   Assessment/Plan Left hip fx s/p mechanical fall --RCRI 1.  Very active, takes care of her husband at home, no difficulty walking, no exertional symptoms.  Appears stable for surgery without further evaluation.  Of note she denies being on atenolol/Tenormin. --Surgery planned for tomorrow.  N.p.o. after midnight.  PRN analgesia.  Atrial fibrillation.  Rate controlled. --Warfarin on hold.  Coronary artery disease, status post CABG 2002. --No recurrent symptoms.  EKG nonacute.  No further evaluation suggested.  Severity of Illness: The appropriate patient status for this patient is INPATIENT. Inpatient status is judged to be reasonable and necessary in order to provide the required intensity of service to ensure the patient's safety. The patient's presenting symptoms, physical exam findings, and initial radiographic and laboratory data in the context of their chronic comorbidities is felt to place them at high risk for further clinical deterioration. Furthermore, it is not anticipated that the patient will be medically stable for discharge from the hospital within 2 midnights of admission. The following factors support the patient status of  inpatient.   See above  * I certify that at the point of admission it is my clinical judgment that the patient will require inpatient hospital care spanning beyond 2 midnights from the point of admission due to high intensity of service, high risk for further deterioration and high frequency of surveillance required.*  DVT prophylaxis: warfarin on hold Code Status: Full Family Communication: none Consults called: ortho Dr. Eulah Pont by Dr. Adela Lank    Time spent: 60 minutes  Brendia Sacks, MD  [facility] Direct contact: [phone] --Via amion app OR  --[email or web address]; password [email or web address]  7PM-7AM contact night coverage as above  09/27/2017, 6:33 PM

## 2017-09-27 NOTE — ED Provider Notes (Signed)
[facility] EMERGENCY DEPARTMENT Provider Note   CSN: [id number] Arrival date & time: 09/27/17  1426     History   Chief Complaint Chief Complaint  Patient presents with  . Fall    HPI Ellen Barajas is a 84 y.o. female.  84 yo F with a chief complaint of left hip pain.  This started when she was trying to load groceries in her car.  She said someone was helping her and she asked them to not help her when she did she turned and lost her balance and fell.  Landed on her left side of her bottom.  She was unable to get up and walk afterwards.  Complaining of pain with movement of the left leg.  She also had trouble getting into her car.  Denies head injury denies loss of consciousness denies back pain.  Denies chest pain or abdominal pain.  The history is provided by the patient.  Fall  This is a new problem. The current episode started yesterday. The problem occurs constantly. The problem has not changed since onset.Pertinent negatives include no chest pain, no headaches and no shortness of breath. The symptoms are aggravated by walking. Nothing relieves the symptoms. She has tried nothing for the symptoms. The treatment provided no relief.    Past Medical History:  Diagnosis Date  . AF (atrial fibrillation) (HCC)    coumadin  . Arthritis   . Coronary artery disease 2002   s/p CABG  . Diverticulosis   . Dyslipidemia   . History of chicken pox   . IHD (ischemic heart disease)   . Post herpetic neuralgia 02/2012    Patient Active Problem List   Diagnosis Date Noted  . Health maintenance examination 09/21/2016  . Essential hypertension 04/11/2016  . Medicare annual wellness visit, subsequent 10/16/2014  . Advanced care planning/counseling discussion 10/16/2014  . DNR (do not resuscitate) 10/16/2014  . Encounter for therapeutic drug monitoring 05/27/2014  . Osteoarthritis 04/19/2013  . Hx of CABG 12/17/2012  . Post herpetic neuralgia 02/25/2012  . IHD  (ischemic heart disease)   . Dyslipidemia   . CAD (coronary artery disease)   . Persistent atrial fibrillation (HCC) 04/19/2010    Past Surgical History:  Procedure Laterality Date  . BREAST LUMPECTOMY     left  . CATARACT EXTRACTION  2013   bilateral  . CORONARY ARTERY BYPASS GRAFT  2002   x 4     OB History   None      Home Medications    Prior to Admission medications   Medication Sig Start Date End Date Taking? Authorizing Provider  aspirin 81 MG tablet Take 81 mg by mouth daily.      [provider]  atenolol (TENORMIN) 25 MG tablet Take 1 tablet by mouth daily 03/29/17   Nahser, Deloris Ping, MD  fish oil-omega-3 fatty acids 1000 MG capsule Take 1 capsule by mouth 2 (two) times daily.     [provider]  losartan (COZAAR) 50 MG tablet TAKE 1 TABLET BY MOUTH ONCE DAILY 05/30/17   Nahser, Deloris Ping, MD  Multiple Vitamins-Minerals (WOMENS MULTI VITAMIN & MINERAL) TABS Take 1 tablet by mouth daily.    [provider]  nitroGLYCERIN (NITROSTAT) 0.4 MG SL tablet Place 0.4 mg under the tongue every 5 (five) minutes as needed for chest pain (for chest pain).    [provider]  Red Yeast Rice Extract (RED YEAST RICE PO) Take by mouth 2 (two)  times daily.      [provider]  warfarin (COUMADIN) 5 MG tablet Take as directed by anticoagulation clinic 08/22/17   Nahser, Deloris Ping, MD    Family History Family History  Problem Relation Age of Onset  . CAD Father 64       MI  . CAD Mother 81       MI    Social History Social History   Tobacco Use  . Smoking status: Never Smoker  . Smokeless tobacco: Never Used  Substance Use Topics  . Alcohol use: No  . Drug use: No     Allergies   Crestor [rosuvastatin calcium]; Lipitor [atorvastatin calcium]; Niaspan [niacin er]; Other; Pravastatin; Ramipril [ramipril]; Welchol [colesevelam hcl]; and Zetia [ezetimibe]   Review of Systems Review of Systems  Constitutional: Negative for chills  and fever.  HENT: Negative for congestion and rhinorrhea.   Eyes: Negative for redness and visual disturbance.  Respiratory: Negative for shortness of breath and wheezing.   Cardiovascular: Negative for chest pain and palpitations.  Gastrointestinal: Negative for nausea and vomiting.  Genitourinary: Negative for dysuria and urgency.  Musculoskeletal: Positive for arthralgias, gait problem and myalgias.  Skin: Negative for pallor and wound.  Neurological: Negative for dizziness and headaches.     Physical Exam Updated Vital Signs BP 122/89 (BP Location: Left Arm)   Pulse 89   Temp 98.3 F (36.8 C)   Resp 17   SpO2 98%   Physical Exam  Constitutional: She is oriented to person, place, and time. She appears well-developed and well-nourished. No distress.  HENT:  Head: Normocephalic and atraumatic.  Eyes: Pupils are equal, round, and reactive to light. EOM are normal.  Neck: Normal range of motion. Neck supple.  Cardiovascular: Normal rate and regular rhythm. Exam reveals no gallop and no friction rub.  No murmur heard. Pulmonary/Chest: Effort normal. She has no wheezes. She has no rales.  Abdominal: Soft. She exhibits no distension and no mass. There is no tenderness. There is no guarding.  Musculoskeletal: She exhibits tenderness. She exhibits no edema.  Pulse motor and sensation is intact to bilateral lower extremities.  Patient having pain with internal and external rotation of the left hip.  No pain with pressure to the pelvis.  No midline spinal tenderness.  Neurological: She is alert and oriented to person, place, and time.  Skin: Skin is warm and dry. She is not diaphoretic.  Psychiatric: She has a normal mood and affect. Her behavior is normal.  Nursing note and vitals reviewed.    ED Treatments / Results  Labs (all labs ordered are listed, but only abnormal results are displayed) Labs Reviewed  PROTIME-INR - Abnormal; Notable for the following components:       Result Value   Prothrombin Time 18.7 (*)    All other components within normal limits  CBC WITH DIFFERENTIAL/PLATELET  BASIC METABOLIC PANEL    EKG EKG Interpretation  Date/Time:  Wednesday September 27 2017 14:53:58 EDT Ventricular Rate:  88 PR Interval:    QRS Duration: 81 QT Interval:  372 QTC Calculation: 451 R Axis:   72 Text Interpretation:  Atrial fibrillation Consider left ventricular hypertrophy Since last tracing rate faster Otherwise no significant change Confirmed by Melene Plan [phone]) on 09/27/2017 3:11:52 PM   Radiology Dg Chest 1 View  Result Date: 09/27/2017 CLINICAL DATA:  Pt fell behind her car today and c/o left hip pain. No chest complaints. Hx of AFIB AND CAD. Pt is a nonsmoker.  EXAM: CHEST  1 VIEW COMPARISON:  10/02/2009 FINDINGS: Cardiac silhouette is mildly enlarged. There stable changes from prior CABG surgery. No mediastinal or hilar masses. No convincing adenopathy. Lungs are hyperexpanded, but clear. No pleural effusion.  No pneumothorax. Skeletal structures are demineralized but grossly intact. IMPRESSION: No acute cardiopulmonary disease. Electronically Signed   By: Amie Portland M.D.   On: 09/27/2017 16:12   Dg Hip Unilat W Or Wo Pelvis 2-3 Views Left  Result Date: 09/27/2017 CLINICAL DATA:  Status post fall today. Left hip pain. Initial encounter. EXAM: DG HIP (WITH OR WITHOUT PELVIS) 2-3V LEFT COMPARISON:  None. FINDINGS: The patient has an acute left intertrochanteric fracture. No other acute abnormality is identified. Bones are osteopenic. IMPRESSION: Acute left intertrochanteric fracture. Osteopenia. Electronically Signed   By: Drusilla Kanner M.D.   On: 09/27/2017 16:00    Procedures Procedures (including critical care time)  Medications Ordered in ED Medications  acetaminophen (TYLENOL) tablet 1,000 mg (1,000 mg Oral Given 09/27/17 1643)  morphine 2 MG/ML injection 2 mg (2 mg Intravenous Given 09/27/17 1643)  ondansetron (ZOFRAN) injection 4 mg (4  mg Intravenous Given 09/27/17 1643)     Initial Impression / Assessment and Plan / ED Course  I have reviewed the triage vital signs and the nursing notes.  Pertinent labs & imaging results that were available during my care of the patient were reviewed by me and considered in my medical decision making (see chart for details).     84 yo F with a fall, occurred just prior to arrival.  Ecg with afib, chronically on coumadin for the same.  Concern for fx with pain with internal and external rotation, will obtain xray.   X-ray with a intertrochanteric fx. I discussed the case with Dr. Eulah Pont, orthopedics.  He recommended making the patient n.p.o. at midnight for likely operative repair tomorrow.  He recommended holding the Coumadin.  Will discuss with the hospitalist.  The patients results and plan were reviewed and discussed.   Any x-rays performed were independently reviewed by myself.   Differential diagnosis were considered with the presenting HPI.  Medications  acetaminophen (TYLENOL) tablet 1,000 mg (1,000 mg Oral Given 09/27/17 1643)  morphine 2 MG/ML injection 2 mg (2 mg Intravenous Given 09/27/17 1643)  ondansetron (ZOFRAN) injection 4 mg (4 mg Intravenous Given 09/27/17 1643)    Vitals:   09/27/17 1454  BP: 122/89  Pulse: 89  Resp: 17  Temp: 98.3 F (36.8 C)  SpO2: 98%    Final diagnoses:  Intertrochanteric fracture of left hip, closed, initial encounter [facility])    Admission/ observation were discussed with the admitting physician, patient and/or family and they are comfortable with the plan.    Final Clinical Impressions(s) / ED Diagnoses   Final diagnoses:  Intertrochanteric fracture of left hip, closed, initial encounter [facility])    ED Discharge Orders    None       Melene Plan, DO 09/27/17 1654

## 2017-09-27 NOTE — ED Triage Notes (Signed)
Pt here after falling in a parking lot , pt c/o left hip pain , no loc , pt did not  hit her head

## 2017-09-28 ENCOUNTER — Encounter (HOSPITAL_COMMUNITY)
Admission: EM | Disposition: A | Discharge status: Skilled Nursing Facility | Payer: Self-pay | Source: Home / Self Care | Attending: Internal Medicine

## 2017-09-28 ENCOUNTER — Encounter (HOSPITAL_COMMUNITY): Payer: Self-pay | Admitting: Certified Registered Nurse Anesthetist

## 2017-09-28 ENCOUNTER — Inpatient Hospital Stay (HOSPITAL_COMMUNITY): Payer: Medicare Other | Admitting: Certified Registered"

## 2017-09-28 ENCOUNTER — Inpatient Hospital Stay (HOSPITAL_COMMUNITY): Payer: Medicare Other

## 2017-09-28 DIAGNOSIS — S72142A Displaced intertrochanteric fracture of left femur, initial encounter for closed fracture: Principal | ICD-10-CM

## 2017-09-28 DIAGNOSIS — Z7901 Long term (current) use of anticoagulants: Secondary | ICD-10-CM

## 2017-09-28 DIAGNOSIS — Z951 Presence of aortocoronary bypass graft: Secondary | ICD-10-CM

## 2017-09-28 DIAGNOSIS — I481 Persistent atrial fibrillation: Secondary | ICD-10-CM

## 2017-09-28 HISTORY — PX: FEMUR IM NAIL: SHX1597

## 2017-09-28 LAB — CBC
HCT: 36.4 % (ref 36.0–46.0)
HEMOGLOBIN: 11.5 g/dL — AB (ref 12.0–15.0)
MCH: 29.7 pg (ref 26.0–34.0)
MCHC: 31.6 g/dL (ref 30.0–36.0)
MCV: 94.1 fL (ref 78.0–100.0)
PLATELETS: 197 10*3/uL (ref 150–400)
RBC: 3.87 MIL/uL (ref 3.87–5.11)
RDW: 14 % (ref 11.5–15.5)
WBC: 8 10*3/uL (ref 4.0–10.5)

## 2017-09-28 LAB — SURGICAL PCR SCREEN
MRSA, PCR: NEGATIVE
Staphylococcus aureus: NEGATIVE

## 2017-09-28 SURGERY — INSERTION, INTRAMEDULLARY ROD, FEMUR
Anesthesia: General | Site: Hip | Laterality: Left

## 2017-09-28 MED ORDER — DEXAMETHASONE SODIUM PHOSPHATE 10 MG/ML IJ SOLN
INTRAMUSCULAR | Status: AC
  Filled 2017-09-28: qty 2

## 2017-09-28 MED ORDER — BISACODYL 10 MG RE SUPP: 10.0000 mg | Freq: Every day | RECTAL | Status: DC | PRN

## 2017-09-28 MED ORDER — ROCURONIUM BROMIDE 50 MG/5ML IV SOSY
PREFILLED_SYRINGE | INTRAVENOUS | Status: AC
  Filled 2017-09-28: qty 5

## 2017-09-28 MED ORDER — WARFARIN - PHARMACIST DOSING INPATIENT
Freq: Every day | Status: DC
  Administered 2017-10-02: 18:00:00

## 2017-09-28 MED ORDER — ONDANSETRON HCL 4 MG/2ML IJ SOLN
INTRAMUSCULAR | Status: AC
  Filled 2017-09-28: qty 2

## 2017-09-28 MED ORDER — ONDANSETRON HCL 4 MG/2ML IJ SOLN: 4.0000 mg | Freq: Four times a day (QID) | INTRAMUSCULAR | Status: DC | PRN

## 2017-09-28 MED ORDER — PROPOFOL 10 MG/ML IV BOLUS
INTRAVENOUS | Status: DC | PRN
  Administered 2017-09-28: 80 mg via INTRAVENOUS

## 2017-09-28 MED ORDER — LIDOCAINE HCL (CARDIAC) PF 100 MG/5ML IV SOSY
PREFILLED_SYRINGE | INTRAVENOUS | Status: DC | PRN
  Administered 2017-09-28: 80 mg via INTRAVENOUS

## 2017-09-28 MED ORDER — METOCLOPRAMIDE HCL 5 MG PO TABS: 5.0000 mg | ORAL_TABLET | Freq: Three times a day (TID) | ORAL | Status: DC | PRN

## 2017-09-28 MED ORDER — SUCCINYLCHOLINE CHLORIDE 200 MG/10ML IV SOSY
PREFILLED_SYRINGE | INTRAVENOUS | Status: AC
  Filled 2017-09-28: qty 10

## 2017-09-28 MED ORDER — CEFAZOLIN SODIUM-DEXTROSE 2-3 GM-%(50ML) IV SOLR
INTRAVENOUS | Status: DC | PRN
  Administered 2017-09-28: 2 g via INTRAVENOUS

## 2017-09-28 MED ORDER — POLYETHYLENE GLYCOL 3350 17 G PO PACK: 17.0000 g | PACK | Freq: Every day | ORAL | Status: DC | PRN

## 2017-09-28 MED ORDER — PHENYLEPHRINE HCL 10 MG/ML IJ SOLN
INTRAMUSCULAR | Status: DC | PRN
  Administered 2017-09-28 (×2): 120 ug via INTRAVENOUS

## 2017-09-28 MED ORDER — LIDOCAINE 2% (20 MG/ML) 5 ML SYRINGE
INTRAMUSCULAR | Status: AC
  Filled 2017-09-28: qty 5

## 2017-09-28 MED ORDER — FENTANYL CITRATE (PF) 100 MCG/2ML IJ SOLN
INTRAMUSCULAR | Status: DC | PRN
  Administered 2017-09-28: 50 ug via INTRAVENOUS
  Administered 2017-09-28: 25 ug via INTRAVENOUS
  Administered 2017-09-28: 50 ug via INTRAVENOUS

## 2017-09-28 MED ORDER — PROPOFOL 10 MG/ML IV BOLUS
INTRAVENOUS | Status: AC
  Filled 2017-09-28: qty 20

## 2017-09-28 MED ORDER — FENTANYL CITRATE (PF) 100 MCG/2ML IJ SOLN
25.0000 ug | INTRAMUSCULAR | Status: DC | PRN
  Administered 2017-09-28: 25 ug via INTRAVENOUS
  Administered 2017-09-28: 50 ug via INTRAVENOUS
  Administered 2017-09-28: 25 ug via INTRAVENOUS

## 2017-09-28 MED ORDER — ONDANSETRON HCL 4 MG/2ML IJ SOLN: 4.0000 mg | Freq: Once | INTRAMUSCULAR | Status: DC | PRN

## 2017-09-28 MED ORDER — CEFAZOLIN SODIUM-DEXTROSE 2-4 GM/100ML-% IV SOLN
INTRAVENOUS | Status: AC
  Filled 2017-09-28: qty 100

## 2017-09-28 MED ORDER — EPHEDRINE 5 MG/ML INJ
INTRAVENOUS | Status: AC
  Filled 2017-09-28: qty 10

## 2017-09-28 MED ORDER — OXYCODONE HCL 5 MG PO TABS
ORAL_TABLET | ORAL | Status: AC
  Filled 2017-09-28: qty 1

## 2017-09-28 MED ORDER — ONDANSETRON HCL 4 MG/2ML IJ SOLN
INTRAMUSCULAR | Status: DC | PRN
  Administered 2017-09-28: 4 mg via INTRAVENOUS

## 2017-09-28 MED ORDER — LACTATED RINGERS IV SOLN
INTRAVENOUS | Status: DC
  Administered 2017-09-28: 09:00:00 via INTRAVENOUS

## 2017-09-28 MED ORDER — PHENYLEPHRINE 40 MCG/ML (10ML) SYRINGE FOR IV PUSH (FOR BLOOD PRESSURE SUPPORT)
PREFILLED_SYRINGE | INTRAVENOUS | Status: AC
  Filled 2017-09-28: qty 10

## 2017-09-28 MED ORDER — OXYCODONE HCL 5 MG PO TABS
5.0000 mg | ORAL_TABLET | Freq: Once | ORAL | Status: AC | PRN
  Administered 2017-09-28: 5 mg via ORAL

## 2017-09-28 MED ORDER — ONDANSETRON HCL 4 MG PO TABS: 4.0000 mg | ORAL_TABLET | Freq: Four times a day (QID) | ORAL | Status: DC | PRN

## 2017-09-28 MED ORDER — DOCUSATE SODIUM 100 MG PO CAPS
100.0000 mg | ORAL_CAPSULE | Freq: Two times a day (BID) | ORAL | Status: DC
  Administered 2017-09-28 – 2017-10-01 (×6): 100 mg via ORAL
  Filled 2017-09-28 (×9): qty 1

## 2017-09-28 MED ORDER — MORPHINE SULFATE (PF) 2 MG/ML IV SOLN: 0.5000 mg | INTRAVENOUS | Status: DC | PRN

## 2017-09-28 MED ORDER — CEFAZOLIN SODIUM-DEXTROSE 2-4 GM/100ML-% IV SOLN
2.0000 g | Freq: Four times a day (QID) | INTRAVENOUS | Status: AC
  Administered 2017-09-28 (×2): 2 g via INTRAVENOUS
  Filled 2017-09-28 (×3): qty 100

## 2017-09-28 MED ORDER — 0.9 % SODIUM CHLORIDE (POUR BTL) OPTIME
TOPICAL | Status: DC | PRN
  Administered 2017-09-28: 1000 mL

## 2017-09-28 MED ORDER — FENTANYL CITRATE (PF) 250 MCG/5ML IJ SOLN
INTRAMUSCULAR | Status: AC
  Filled 2017-09-28: qty 5

## 2017-09-28 MED ORDER — EPHEDRINE SULFATE 50 MG/ML IJ SOLN
INTRAMUSCULAR | Status: DC | PRN
  Administered 2017-09-28: 10 mg via INTRAVENOUS

## 2017-09-28 MED ORDER — METOCLOPRAMIDE HCL 5 MG/ML IJ SOLN: 5.0000 mg | Freq: Three times a day (TID) | INTRAMUSCULAR | Status: DC | PRN

## 2017-09-28 MED ORDER — FENTANYL CITRATE (PF) 100 MCG/2ML IJ SOLN
INTRAMUSCULAR | Status: AC
  Filled 2017-09-28: qty 2

## 2017-09-28 MED ORDER — OXYCODONE HCL 5 MG/5ML PO SOLN: 5.0000 mg | Freq: Once | ORAL | Status: AC | PRN

## 2017-09-28 MED ORDER — WARFARIN SODIUM 6 MG PO TABS
6.0000 mg | ORAL_TABLET | Freq: Once | ORAL | Status: AC
  Administered 2017-09-28: 6 mg via ORAL
  Filled 2017-09-28: qty 1

## 2017-09-28 MED ORDER — HYDROCODONE-ACETAMINOPHEN 5-325 MG PO TABS
1.0000 | ORAL_TABLET | ORAL | Status: DC | PRN
  Administered 2017-09-29 – 2017-09-30 (×3): 1 via ORAL
  Filled 2017-09-28 (×4): qty 1

## 2017-09-28 MED ORDER — FLEET ENEMA 7-19 GM/118ML RE ENEM: 1.0000 | ENEMA | Freq: Once | RECTAL | Status: DC | PRN

## 2017-09-28 MED ORDER — ACETAMINOPHEN 500 MG PO TABS
500.0000 mg | ORAL_TABLET | Freq: Four times a day (QID) | ORAL | Status: AC
  Administered 2017-09-28 – 2017-09-29 (×3): 500 mg via ORAL
  Filled 2017-09-28 (×3): qty 1

## 2017-09-28 SURGICAL SUPPLY — 38 items
ADH SKN CLS LQ APL DERMABOND (GAUZE/BANDAGES/DRESSINGS) ×1
CLSR STERI-STRIP ANTIMIC 1/2X4 (GAUZE/BANDAGES/DRESSINGS) ×2 IMPLANT
COVER PERINEAL POST (MISCELLANEOUS) ×2 IMPLANT
COVER SURGICAL LIGHT HANDLE (MISCELLANEOUS) ×2 IMPLANT
DERMABOND ADHESIVE PROPEN (GAUZE/BANDAGES/DRESSINGS) ×1
DERMABOND ADVANCED .7 DNX6 (GAUZE/BANDAGES/DRESSINGS) IMPLANT
DRAPE STERI IOBAN 125X83 (DRAPES) ×2 IMPLANT
DRSG MEPILEX BORDER 4X4 (GAUZE/BANDAGES/DRESSINGS) ×3 IMPLANT
DURAPREP 26ML APPLICATOR (WOUND CARE) ×2 IMPLANT
ELECT REM PT RETURN 9FT ADLT (ELECTROSURGICAL) ×2
ELECTRODE REM PT RTRN 9FT ADLT (ELECTROSURGICAL) ×1 IMPLANT
GLOVE BIO SURGEON STRL SZ7.5 (GLOVE) ×4 IMPLANT
GLOVE BIOGEL PI IND STRL 8 (GLOVE) ×2 IMPLANT
GLOVE BIOGEL PI INDICATOR 8 (GLOVE) ×2
GOWN STRL REUS W/ TWL LRG LVL3 (GOWN DISPOSABLE) ×3 IMPLANT
GOWN STRL REUS W/TWL LRG LVL3 (GOWN DISPOSABLE) ×6
GUIDEROD T2 3X1000 (ROD) ×1 IMPLANT
K-WIRE  3.2X450M STR (WIRE) ×1
K-WIRE 3.2X450M STR (WIRE) ×1
KIT BASIN OR (CUSTOM PROCEDURE TRAY) ×2 IMPLANT
KIT TURNOVER KIT B (KITS) ×2 IMPLANT
KWIRE 3.2X450M STR (WIRE) IMPLANT
MANIFOLD NEPTUNE II (INSTRUMENTS) ×2 IMPLANT
NAIL LONG RT 1.5 10X340-125 (Nail) ×1 IMPLANT
NS IRRIG 1000ML POUR BTL (IV SOLUTION) ×2 IMPLANT
PACK GENERAL/GYN (CUSTOM PROCEDURE TRAY) ×2 IMPLANT
PAD ARMBOARD 7.5X6 YLW CONV (MISCELLANEOUS) ×4 IMPLANT
SCREW LAG GAMMA 3 TI 10.5X90MM (Screw) ×1 IMPLANT
SUT MNCRL AB 3-0 PS2 18 (SUTURE) ×1 IMPLANT
SUT MNCRL AB 4-0 PS2 18 (SUTURE) IMPLANT
SUT MON AB 2-0 CT1 36 (SUTURE) IMPLANT
SUT VIC AB 0 CT1 27 (SUTURE) ×4
SUT VIC AB 0 CT1 27XBRD ANBCTR (SUTURE) ×1 IMPLANT
SUT VIC AB 2-0 CT1 27 (SUTURE) ×2
SUT VIC AB 2-0 CT1 TAPERPNT 27 (SUTURE) IMPLANT
TOWEL OR 17X24 6PK STRL BLUE (TOWEL DISPOSABLE) ×2 IMPLANT
TOWEL OR 17X26 10 PK STRL BLUE (TOWEL DISPOSABLE) ×2 IMPLANT
WATER STERILE IRR 1000ML POUR (IV SOLUTION) ×2 IMPLANT

## 2017-09-28 NOTE — Transfer of Care (Signed)
Immediate Anesthesia Transfer of Care Note  Patient: Ellen Barajas  Procedure(s) Performed: INTRAMEDULLARY (IM) NAIL FEMORAL (Left Hip)  Patient Location: PACU  Anesthesia Type:General  Level of Consciousness: awake, alert , oriented and patient cooperative  Airway & Oxygen Therapy: Patient Spontanous Breathing and Patient connected to nasal cannula oxygen  Post-op Assessment: Report given to RN and Post -op Vital signs reviewed and stable  Post vital signs: Reviewed and stable  Last Vitals:  Vitals Value Taken Time  BP 174/58 09/28/2017 10:11 AM  Temp    Pulse 89 09/28/2017 10:12 AM  Resp 13 09/28/2017 10:12 AM  SpO2 100 % 09/28/2017 10:12 AM  Vitals shown include unvalidated device data.  Last Pain:  Vitals:   09/28/17 0614  TempSrc: Oral  PainSc:          Complications: No apparent anesthesia complications

## 2017-09-28 NOTE — Progress Notes (Signed)
ANTICOAGULATION CONSULT NOTE - Initial Consult  Pharmacy Consult:  Coumadin Indication:  Afib  Allergies  Allergen Reactions  . Crestor [Rosuvastatin Calcium]   . Lipitor [Atorvastatin Calcium]   . Niaspan [Niacin Er]   . Other     PER PATIENT SHE DOES NOT TOLERATE NARCOTICS  . Pravastatin   . Ramipril [Ramipril]     Hyperkalemia  . Welchol [Colesevelam Hcl]   . Zetia [Ezetimibe]     Patient Measurements: Height: 5' (152.4 cm) Weight: 148 lb 13 oz (67.5 kg) IBW/kg (Calculated) : 45.5  Vital Signs: Temp: 97.5 F (36.4 C) (09/05 1338) Temp Source: Oral (09/05 1338) BP: 136/65 (09/05 1338) Pulse Rate: 93 (09/05 1338)  Labs: Recent Labs    09/27/17 1522 09/28/17 0414  HGB 12.9 11.5*  HCT 41.2 36.4  PLT 218 197  LABPROT 18.7*  --   INR 1.58  --   CREATININE 0.83  --     Estimated Creatinine Clearance: 40.9 mL/min (by C-G formula based on SCr of 0.83 mg/dL).   Medical History: Past Medical History:  Diagnosis Date  . AF (atrial fibrillation) (HCC)    coumadin  . Arthritis    "hands" (09/27/2017)  . Coronary artery disease 2002   s/p CABG  . Diverticulosis   . Dyslipidemia   . History of chicken pox   . IHD (ischemic heart disease)   . Myocardial infarction (HCC) ~ 2002  . Post herpetic neuralgia 02/2012      Assessment: 89 YOF to resume Coumadin for history of Afib and VTE prophylaxis s/p IM nail left hip.  INR currently sub-therapeutic.  No bleeding reported.  Home Coumadin regimen:  5mg  daily except 2.5mg  on MWF    Goal of Therapy:  INR 2-3 Monitor platelets by anticoagulation protocol: Yes    Plan:  Coumadin 6mg  PO today Daily PT / INR   Kamarri Fischetti D. Laney Potash, PharmD, BCPS, BCCCP 09/28/2017, 1:45 PM

## 2017-09-28 NOTE — Anesthesia Postprocedure Evaluation (Signed)
Anesthesia Post Note  Patient: Ellen Barajas  Procedure(s) Performed: INTRAMEDULLARY (IM) NAIL FEMORAL (Left Hip)     Patient location during evaluation: PACU Anesthesia Type: General Level of consciousness: awake and alert Pain management: pain level controlled Vital Signs Assessment: post-procedure vital signs reviewed and stable Respiratory status: spontaneous breathing, nonlabored ventilation and respiratory function stable Cardiovascular status: blood pressure returned to baseline and stable Postop Assessment: no apparent nausea or vomiting Anesthetic complications: no    Last Vitals:  Vitals:   09/28/17 1025 09/28/17 1040  BP: (!) 137/59 (!) 165/71  Pulse: 93 (!) 58  Resp: 12 13  Temp:    SpO2: 100% 100%    Last Pain:  Vitals:   09/28/17 1040  TempSrc:   PainSc: 0-No pain                 Beryle Lathe

## 2017-09-28 NOTE — Progress Notes (Signed)
Patient returns to room 5N31 at this time.

## 2017-09-28 NOTE — Consult Note (Signed)
ORTHOPAEDIC CONSULTATION  REQUESTING PHYSICIAN: Florencia Reasons, MD  Chief Complaint: left hip fracture  HPI: 84 year old woman PMH atrial fibrillation, CABG, presented after a fall at the grocery store resulting in difficulty moving her left leg.  Imaging revealed left hip fracture.  Patient was in the parking lot putting away her groceries when she turned around and fell to the ground, immediately afterwards she had great difficulty moving her leg although she had no pain.  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  No specific aggravating or alleviating factors noted, again no pain noted.  She is a history of coronary artery disease but has had no recent chest pain or shortness of breath and is quite active.  Above reviewed with patient and family  Past Medical History:  Diagnosis Date  . AF (atrial fibrillation) (HCC)    coumadin  . Arthritis    "hands" (09/27/2017)  . Coronary artery disease 2002   s/p CABG  . Diverticulosis   . Dyslipidemia   . History of chicken pox   . IHD (ischemic heart disease)   . Myocardial infarction ([place]) ~ 2002  . Post herpetic neuralgia 02/2012   Past Surgical History:  Procedure Laterality Date  . BREAST LUMPECTOMY Left 1992   excision of fibrous tumor/notes 06/19/2000  . CARDIAC CATHETERIZATION  06/08/2000  . CATARACT EXTRACTION W/ INTRAOCULAR LENS  IMPLANT, BILATERAL Bilateral 2013  . CORONARY ARTERY BYPASS GRAFT  2002   x 4  . EYE SURGERY Bilateral 1989   Social History   Socioeconomic History  . Marital status: Married    Spouse name: Not on file  . Number of children: Not on file  . Years of education: Not on file  . Highest education level: Not on file  Occupational History  . Not on file  Social Needs  . Financial resource strain: Not on file  . Food insecurity:    Worry: Not on file    Inability: Not on file  . Transportation  needs:    Medical: Not on file    Non-medical: Not on file  Tobacco Use  . Smoking status: Never Smoker  . Smokeless tobacco: Never Used  Substance and Sexual Activity  . Alcohol use: Never    Frequency: Never  . Drug use: Never  . Sexual activity: Not on file  Lifestyle  . Physical activity:    Days per week: Not on file    Minutes per session: Not on file  . Stress: Not on file  Relationships  . Social connections:    Talks on phone: Not on file    Gets together: Not on file    Attends religious service: Not on file    Active member of club or organization: Not on file    Attends meetings of clubs or organizations: Not on file    Relationship status: Not on file  Other Topics Concern  . Not on file  Social History Narrative   Lives and cares for disabled husband (stroke)   Occupation: retired, was LPN   Family History  Problem Relation Age of Onset  . CAD Father 43       MI  . CAD Mother 27       MI   Allergies  Allergen Reactions  . Crestor [Rosuvastatin Calcium]   . Lipitor [Atorvastatin Calcium]   . Niaspan [Niacin Er]   . Other  PER PATIENT SHE DOES NOT TOLERATE NARCOTICS  . Pravastatin   . Ramipril [Ramipril]     Hyperkalemia  . Welchol [Colesevelam Hcl]   . Zetia [Ezetimibe]    Prior to Admission medications   Medication Sig Start Date End Date Taking? Authorizing Provider  aspirin 81 MG tablet Take 81 mg by mouth daily.      [provider]  atenolol (TENORMIN) 25 MG tablet Take 1 tablet by mouth daily 03/29/17   Nahser, Wonda Cheng, MD  fish oil-omega-3 fatty acids 1000 MG capsule Take 1 capsule by mouth 2 (two) times daily.     [provider]  losartan (COZAAR) 50 MG tablet TAKE 1 TABLET BY MOUTH ONCE DAILY 05/30/17   Nahser, Wonda Cheng, MD  Multiple Vitamins-Minerals (WOMENS MULTI VITAMIN & MINERAL) TABS Take 1 tablet by mouth daily.    [provider]  nitroGLYCERIN (NITROSTAT) 0.4 MG SL tablet Place 0.4 mg under the tongue  every 5 (five) minutes as needed for chest pain (for chest pain).    [provider]  Red Yeast Rice Extract (RED YEAST RICE PO) Take by mouth 2 (two) times daily.      [provider]  warfarin (COUMADIN) 5 MG tablet Take as directed by anticoagulation clinic 08/22/17   Nahser, Wonda Cheng, MD   Dg Chest 1 View  Result Date: 09/27/2017 CLINICAL DATA:  Pt fell behind her car today and c/o left hip pain. No chest complaints. Hx of AFIB AND CAD. Pt is a nonsmoker. EXAM: CHEST  1 VIEW COMPARISON:  10/02/2009 FINDINGS: Cardiac silhouette is mildly enlarged. There stable changes from prior CABG surgery. No mediastinal or hilar masses. No convincing adenopathy. Lungs are hyperexpanded, but clear. No pleural effusion.  No pneumothorax. Skeletal structures are demineralized but grossly intact. IMPRESSION: No acute cardiopulmonary disease. Electronically Signed   By: Lajean Manes M.D.   On: 09/27/2017 16:12   Dg Hip Unilat W Or Wo Pelvis 2-3 Views Left  Result Date: 09/27/2017 CLINICAL DATA:  Status post fall today. Left hip pain. Initial encounter. EXAM: DG HIP (WITH OR WITHOUT PELVIS) 2-3V LEFT COMPARISON:  None. FINDINGS: The patient has an acute left intertrochanteric fracture. No other acute abnormality is identified. Bones are osteopenic. IMPRESSION: Acute left intertrochanteric fracture. Osteopenia. Electronically Signed   By: Inge Rise M.D.   On: 09/27/2017 16:00    Positive ROS: All other systems have been reviewed and were otherwise negative with the exception of those mentioned in the HPI and as above.  Labs cbc Recent Labs    09/27/17 1522 09/28/17 0414  WBC 6.9 8.0  HGB 12.9 11.5*  HCT 41.2 36.4  PLT 218 197    Labs inflam No results for input(s): CRP in the last 72 hours.  Invalid input(s): ESR  Labs coag Recent Labs    09/27/17 1522  INR 1.58    Recent Labs    09/27/17 1522  NA 142  K 4.3  CL 108  CO2 23  GLUCOSE 93  BUN 17  CREATININE 0.83    CALCIUM 9.2    Physical Exam: Vitals:   09/27/17 2049 09/28/17 0614  BP: (!) 141/65 (!) 142/60  Pulse: 80 63  Resp: 18 17  Temp: 97.9 F (36.6 C) 98 F (36.7 C)  SpO2: 98% 99%   General: Alert, no acute distress Cardiovascular: No pedal edema Respiratory: No cyanosis, no use of accessory musculature GI: No organomegaly, abdomen is soft and non-tender Skin: No lesions in  the area of chief complaint other than those listed below in MSK exam.  Neurologic: Sensation intact distally save for the below mentioned MSK exam Psychiatric: Patient is competent for consent with normal mood and affect Lymphatic: No axillary or cervical lymphadenopathy  MUSCULOSKELETAL:  LLE: NVI, pain with ROM Other extremities are atraumatic with painless ROM and NVI.  Assessment: L hip fracture  Plan: OR today for IM nail   Renette Butters, MD Cell [phone]   09/28/2017 9:08 AM

## 2017-09-28 NOTE — Op Note (Signed)
DATE OF SURGERY:  09/28/2017  TIME: 9:52 AM  PATIENT NAME:  Ellen Barajas  AGE: 84 y.o.  PRE-OPERATIVE DIAGNOSIS:  left hip fracture  POST-OPERATIVE DIAGNOSIS:  SAME  PROCEDURE:  INTRAMEDULLARY (IM) NAIL FEMORAL  SURGEON:  Sheral Apley  ASSISTANT:  none  OPERATIVE IMPLANTS: Stryker Gamma Nail  PREOPERATIVE INDICATIONS:  [name] is a 84 y.o. year old who fell and suffered a hip fracture. She was brought into the ER and then admitted and optimized and then elected for surgical intervention.    The risks benefits and alternatives were discussed with the patient including but not limited to the risks of nonoperative treatment, versus surgical intervention including infection, bleeding, nerve injury, malunion, nonunion, hardware prominence, hardware failure, need for hardware removal, blood clots, cardiopulmonary complications, morbidity, mortality, among others, and they were willing to proceed.    OPERATIVE PROCEDURE:  The patient was brought to the operating room and placed in the supine position. General anesthesia was administered. She was placed on the fracture table.  Closed reduction was performed under C-arm guidance. Time out was then performed after sterile prep and drape. She received preoperative antibiotics.  Incision was made proximal to the greater trochanter. A guidewire was placed in the appropriate position. Confirmation was made on AP and lateral views. The above-named nail was opened. I opened the proximal femur with a reamer. I then placed the nail by hand easily down. I did not need to ream the femur.  Once the nail was completely seated, I placed a guidepin into the femoral head into the center center position. I measured the length, and then reamed the lateral cortex and up into the head. I then placed the lag screw. Slight compression was applied. Anatomic fixation achieved. Bone quality was mediocre.  I then secured the proximal interlocking bolt, and took  off a half a turn, and then removed the instruments, and took final C-arm pictures AP and lateral the entire length of the leg.   Anatomic reconstruction was achieved, and the wounds were irrigated copiously and closed with Vicryl followed by staples and sterile gauze for the skin. The patient was awakened and returned to PACU in stable and satisfactory condition. There no complications and the patient tolerated the procedure well.  She will be weightbearing as tolerated, and will be on chemical px  for a period of four weeks after discharge.   Margarita Rana, M.D.

## 2017-09-28 NOTE — Progress Notes (Signed)
PROGRESS NOTE  Ellen Barajas NWG:[id number] DOB: October 23, 1928 DOA: 09/27/2017 PCP: Eustaquio Boyden, MD  Brief Summary:  patient fell in the parking lot while trying to load groceries in her car.  She said someone was helping her and she asked them to not help her when she did she turned and lost her balance and fell.  Landed on her left side of her bottom.    HPI/Recap of past 24 hours:  Patient is seen after returning from the OR, she is awake, currently denies pain Family at bedside  Assessment/Plan: Active Problems:   Persistent atrial fibrillation (HCC)   IHD (ischemic heart disease)   Hx of CABG   Hip fracture (HCC)  Left hip fx s/p mechanical fall -patient at baseline Very active, takes care of her husband at home, no difficulty walking, no exertional symptoms. RCRI 1. -IM nailing on 9/5 -ortho to kindly address post op weight bearing status, wound care, pain management, when to resume coumadin   Chronic afib on coumadin: -rate controlled -INR on presentation is 1.58 -coumadin held in preparation for surgery, resume coumadin when ok with ortho  CAD s/p CABG in 2002 -stable, no chest pain, no sob, no acute EKG changes -continue home meds   Code Status: full  Family Communication: patient and family  Disposition Plan: pending PT eval   Consultants:  ortho  Procedures:  IM nailing to left hip on 9/5  Antibiotics:  perioperative   Objective: BP (!) 142/60 (BP Location: Left Arm)   Pulse 63   Temp 98 F (36.7 C) (Oral)   Resp 17   Ht 5' (1.524 m)   Wt 67.5 kg   SpO2 99%   BMI 29.06 kg/m   Intake/Output Summary (Last 24 hours) at 09/28/2017 5784 Last data filed at 09/28/2017 6962 Gross per 24 hour  Intake 240 ml  Output 400 ml  Net -160 ml   Filed Weights   09/27/17 2000  Weight: 67.5 kg    Exam: Patient is examined daily including today on 09/28/2017, exams remain the same as of yesterday except that has changed    General:  NAD, hard of  hearing  Cardiovascular: IRRR, + murmur ( reports chronic)  Respiratory: CTABL  Abdomen: Soft/ND/NT, positive BS  Musculoskeletal: No Edema  Neuro: alert, oriented   Data Reviewed: Basic Metabolic Panel: Recent Labs  Lab 09/27/17 1522  NA 142  K 4.3  CL 108  CO2 23  GLUCOSE 93  BUN 17  CREATININE 0.83  CALCIUM 9.2   Liver Function Tests: No results for input(s): AST, ALT, ALKPHOS, BILITOT, PROT, ALBUMIN in the last 168 hours. No results for input(s): LIPASE, AMYLASE in the last 168 hours. No results for input(s): AMMONIA in the last 168 hours. CBC: Recent Labs  Lab 09/27/17 1522 09/28/17 0414  WBC 6.9 8.0  NEUTROABS 5.1  --   HGB 12.9 11.5*  HCT 41.2 36.4  MCV 95.8 94.1  PLT 218 197   Cardiac Enzymes:   No results for input(s): CKTOTAL, CKMB, CKMBINDEX, TROPONINI in the last 168 hours. BNP (last 3 results) No results for input(s): BNP in the last 8760 hours.  ProBNP (last 3 results) No results for input(s): PROBNP in the last 8760 hours.  CBG: No results for input(s): GLUCAP in the last 168 hours.  Recent Results (from the past 240 hour(s))  MRSA PCR Screening     Status: None   Collection Time: 09/27/17  6:46 PM  Result Value Ref Range  Status   MRSA by PCR NEGATIVE NEGATIVE Final    Comment:        The GeneXpert MRSA Assay (FDA approved for NASAL specimens only), is one component of a comprehensive MRSA colonization surveillance program. It is not intended to diagnose MRSA infection nor to guide or monitor treatment for MRSA infections. Performed at [facility], [address], [place], [place] [address]   Surgical pcr screen     Status: None   Collection Time: 09/28/17  1:16 AM  Result Value Ref Range Status   MRSA, PCR NEGATIVE NEGATIVE Final   Staphylococcus aureus NEGATIVE NEGATIVE Final    Comment: (NOTE) The Xpert SA Assay (FDA approved for NASAL specimens in patients 27 years of age and older), is one component of a  comprehensive surveillance program. It is not intended to diagnose infection nor to guide or monitor treatment. Performed at [facility], [address] [place], [place] [address]      Studies: Dg Chest 1 View  Result Date: 09/27/2017 CLINICAL DATA:  Pt fell behind her car today and c/o left hip pain. No chest complaints. Hx of AFIB AND CAD. Pt is a nonsmoker. EXAM: CHEST  1 VIEW COMPARISON:  10/02/2009 FINDINGS: Cardiac silhouette is mildly enlarged. There stable changes from prior CABG surgery. No mediastinal or hilar masses. No convincing adenopathy. Lungs are hyperexpanded, but clear. No pleural effusion.  No pneumothorax. Skeletal structures are demineralized but grossly intact. IMPRESSION: No acute cardiopulmonary disease. Electronically Signed   By: Amie Portland M.D.   On: 09/27/2017 16:12   Dg Hip Unilat W Or Wo Pelvis 2-3 Views Left  Result Date: 09/27/2017 CLINICAL DATA:  Status post fall today. Left hip pain. Initial encounter. EXAM: DG HIP (WITH OR WITHOUT PELVIS) 2-3V LEFT COMPARISON:  None. FINDINGS: The patient has an acute left intertrochanteric fracture. No other acute abnormality is identified. Bones are osteopenic. IMPRESSION: Acute left intertrochanteric fracture. Osteopenia. Electronically Signed   By: Drusilla Kanner M.D.   On: 09/27/2017 16:00    Scheduled Meds:  Continuous Infusions:   Time spent: I have personally reviewed and interpreted on  09/28/2017 daily labs,  imagings as discussed above under date review session and assessment and plans.  I reviewed all nursing notes, pharmacy notes, consultant notes,  vitals, pertinent old records  I have discussed plan of care as described above with RN , patient and family on 09/28/2017   Albertine Grates MD, PhD  [facility] Pager [phone]. If 7PM-7AM, please contact night-coverage at [email or web address], password [facility] 09/28/2017, 7:33 AM  LOS: 1 day

## 2017-09-28 NOTE — Discharge Instructions (Signed)

## 2017-09-28 NOTE — Anesthesia Preprocedure Evaluation (Addendum)
Anesthesia Evaluation  Patient identified by MRN, date of birth, ID band Patient awake    Reviewed: Allergy & Precautions, NPO status , Patient's Chart, lab work & pertinent test results  History of Anesthesia Complications Negative for: history of anesthetic complications  Airway Mallampati: II  TM Distance: >3 FB Neck ROM: Full    Dental  (+) Dental Advisory Given, Teeth Intact   Pulmonary neg pulmonary ROS,    breath sounds clear to auscultation       Cardiovascular hypertension, Pt. on medications and Pt. on home beta blockers + CAD, + Past MI and + CABG (2002)  + dysrhythmias Atrial Fibrillation + Valvular Problems/Murmurs MR  Rhythm:Regular Rate:Normal + Systolic murmurs  '11 TTE - Normal EF, mild MR, trivial AI, traceTR, trivial PR    Neuro/Psych  Postherpetic neuralgia  negative psych ROS   GI/Hepatic negative GI ROS, Neg liver ROS,   Endo/Other  negative endocrine ROS  Renal/GU negative Renal ROS  negative genitourinary   Musculoskeletal  (+) Arthritis ,   Abdominal   Peds  Hematology negative hematology ROS (+)   Anesthesia Other Findings   Reproductive/Obstetrics                            Anesthesia Physical Anesthesia Plan  ASA: III  Anesthesia Plan: General   Post-op Pain Management:    Induction: Intravenous  PONV Risk Score and Plan: 3 and Treatment may vary due to age or medical condition, Ondansetron and Propofol infusion  Airway Management Planned: LMA  Additional Equipment: None  Intra-op Plan:   Post-operative Plan: Extubation in OR  Informed Consent: I have reviewed the patients History and Physical, chart, labs and discussed the procedure including the risks, benefits and alternatives for the proposed anesthesia with the patient or authorized representative who has indicated his/her understanding and acceptance.   Dental advisory given  Plan  Discussed with: CRNA and Anesthesiologist  Anesthesia Plan Comments:        Anesthesia Quick Evaluation

## 2017-09-28 NOTE — Anesthesia Procedure Notes (Signed)
Procedure Name: LMA Insertion Date/Time: 09/28/2017 9:16 AM Performed by: Adonis Housekeeper, CRNA Pre-anesthesia Checklist: Patient identified, Emergency Drugs available, Suction available and Patient being monitored Patient Re-evaluated:Patient Re-evaluated prior to induction Oxygen Delivery Method: Circle system utilized Preoxygenation: Pre-oxygenation with 100% oxygen Induction Type: IV induction LMA Size: 4.0 Number of attempts: 1 Placement Confirmation: positive ETCO2 and breath sounds checked- equal and bilateral Tube secured with: Tape Dental Injury: Teeth and Oropharynx as per pre-operative assessment

## 2017-09-28 NOTE — Plan of Care (Signed)

## 2017-09-29 ENCOUNTER — Encounter (HOSPITAL_COMMUNITY): Payer: Self-pay | Admitting: Orthopedic Surgery

## 2017-09-29 LAB — CBC
HCT: 32.5 % — ABNORMAL LOW (ref 36.0–46.0)
Hemoglobin: 10.2 g/dL — ABNORMAL LOW (ref 12.0–15.0)
MCH: 29.8 pg (ref 26.0–34.0)
MCHC: 31.4 g/dL (ref 30.0–36.0)
MCV: 95 fL (ref 78.0–100.0)
PLATELETS: 189 10*3/uL (ref 150–400)
RBC: 3.42 MIL/uL — ABNORMAL LOW (ref 3.87–5.11)
RDW: 13.9 % (ref 11.5–15.5)
WBC: 7.6 10*3/uL (ref 4.0–10.5)

## 2017-09-29 LAB — BASIC METABOLIC PANEL
Anion gap: 8 (ref 5–15)
BUN: 17 mg/dL (ref 8–23)
CHLORIDE: 104 mmol/L (ref 98–111)
CO2: 28 mmol/L (ref 22–32)
CREATININE: 0.88 mg/dL (ref 0.44–1.00)
Calcium: 8.4 mg/dL — ABNORMAL LOW (ref 8.9–10.3)
GFR calc Af Amer: >60 mL/min (ref >60)
GFR, EST NON AFRICAN AMERICAN: 57 mL/min — AB (ref >60)
Glucose, Bld: 136 mg/dL — ABNORMAL HIGH (ref 70–99)
Potassium: 4.1 mmol/L (ref 3.5–5.1)
SODIUM: 140 mmol/L (ref 135–145)

## 2017-09-29 LAB — PROTIME-INR
INR: 2.73
PROTHROMBIN TIME: 28.7 s — AB (ref 11.4–15.2)

## 2017-09-29 LAB — MAGNESIUM: MAGNESIUM: 2.1 mg/dL (ref 1.7–2.4)

## 2017-09-29 MED ORDER — HYDROCODONE-ACETAMINOPHEN 5-325 MG PO TABS: 1.0000 | ORAL_TABLET | ORAL | 0 refills | Status: AC | PRN

## 2017-09-29 NOTE — Plan of Care (Signed)

## 2017-09-29 NOTE — Evaluation (Signed)
Physical Therapy Evaluation Patient Details Name: Ellen Barajas MRN: [id number] DOB: [birth year] Today's Date: 09/29/2017   History of Present Illness  Ellen Barajas is an 84 y.o. year old who fell and suffered a hip fracture. Now s/p INTRAMEDULLARY (IM) NAIL FEMORAL. PMH atrial fibrillation, CABG.    Clinical Impression  Pt presented sitting OOB in recliner chair, awake and willing to participate in therapy session. Prior to admission, pt reported that she was independent with all functional mobility and ADLs. Pt currently requires mod A overall for all functional mobility. She was unable to take any steps this session and very limited secondary to pain and weakness. Pt would continue to benefit from skilled physical therapy services at this time while admitted and after d/c to address the below listed limitations in order to improve overall safety and independence with functional mobility.     Follow Up Recommendations SNF    Equipment Recommendations  None recommended by PT    Recommendations for Other Services       Precautions / Restrictions Precautions Precautions: Fall Restrictions Weight Bearing Restrictions: Yes LLE Weight Bearing: Weight bearing as tolerated      Mobility  Bed Mobility Overal bed mobility: Needs Assistance Bed Mobility: Sit to Supine     Supine to sit: Mod assist;HOB elevated Sit to supine: Mod assist   General bed mobility comments: increased time and effort, mod A for bilateral LE movement back onto bed  Transfers Overall transfer level: Needs assistance Equipment used: Rolling walker (2 wheeled) Transfers: Sit to/from [employer] Sit to Stand: Mod assist;+2 safety/equipment Stand pivot transfers: Mod assist;+2 safety/equipment       General transfer comment: increased time and effort, cueing for technique and safe hand placement, assist to power into standing and for stability with transition  Ambulation/Gait                Stairs            Wheelchair Mobility    Modified Rankin (Stroke Patients Only)       Balance Overall balance assessment: Needs assistance Sitting-balance support: Feet supported Sitting balance-Leahy Scale: Fair     Standing balance support: Bilateral upper extremity supported Standing balance-Leahy Scale: Poor Standing balance comment: rw for balance                             Pertinent Vitals/Pain Pain Assessment: Faces Faces Pain Scale: Hurts even more Pain Location: LLE with movement Pain Descriptors / Indicators: Throbbing;Sore Pain Intervention(s): Monitored during session;Repositioned    Home Living Family/patient expects to be discharged to:: Private residence Living Arrangements: Spouse/significant other Available Help at Discharge: Family Type of Home: House Home Access: Stairs to enter   Secretary/administrator of Steps: 5 Home Layout: Two level;Able to live on main level with bedroom/bathroom Home Equipment: Grab bars - tub/shower      Prior Function Level of Independence: Independent         Comments: very active, community amb, drives, caregiver for spouse     Hand Dominance        Extremity/Trunk Assessment   Upper Extremity Assessment Upper Extremity Assessment: Generalized weakness;Defer to OT evaluation    Lower Extremity Assessment Lower Extremity Assessment: Generalized weakness;LLE deficits/detail LLE Deficits / Details: pt with decreased strength and ROM limitations secondary to post-op pain and weakness LLE: Unable to fully assess due to pain       Communication  Communication: No difficulties  Cognition Arousal/Alertness: Awake/alert Behavior During Therapy: WFL for tasks assessed/performed Overall Cognitive Status: Within Functional Limits for tasks assessed                                 General Comments: some repetition of stories at noted at end of session      General  Comments General comments (skin integrity, edema, etc.): Trialed pt on RA during transfer with sats in upper 70s/low 80s. Did not recover sufficently enough with breathing techniques and time so reapplied [place] at 1 L with sats in low 90s at end of session.    Exercises     Assessment/Plan    PT Assessment Patient needs continued PT services  PT Problem List Decreased strength;Decreased range of motion;Decreased activity tolerance;Decreased balance;Decreased mobility;Decreased coordination;Decreased knowledge of use of DME;Decreased safety awareness;Decreased knowledge of precautions;Pain       PT Treatment Interventions DME instruction;Gait training;Stair training;Therapeutic activities;Therapeutic exercise;Functional mobility training;Balance training;Neuromuscular re-education;Patient/family education    PT Goals (Current goals can be found in the Care Plan section)  Acute Rehab PT Goals Patient Stated Goal: return home PT Goal Formulation: With patient Time For Goal Achievement: 10/13/17 Potential to Achieve Goals: Good    Frequency Min 2X/week   Barriers to discharge        Co-evaluation               AM-PAC PT "6 Clicks" Daily Activity  Outcome Measure Difficulty turning over in bed (including adjusting bedclothes, sheets and blankets)?: Unable Difficulty moving from lying on back to sitting on the side of the bed? : Unable Difficulty sitting down on and standing up from a chair with arms (e.g., wheelchair, bedside commode, etc,.)?: Unable Help needed moving to and from a bed to chair (including a wheelchair)?: A Lot Help needed walking in hospital room?: A Lot Help needed climbing 3-5 steps with a railing? : Total 6 Click Score: 8    End of Session Equipment Utilized During Treatment: Gait belt Activity Tolerance: Patient limited by pain;Patient limited by fatigue Patient left: in bed;with call bell/phone within reach;with bed alarm set Nurse Communication:  Mobility status PT Visit Diagnosis: Other abnormalities of gait and mobility (R26.89);Pain Pain - Right/Left: Left Pain - part of body: Leg    Time: 1434-1450 PT Time Calculation (min) (ACUTE ONLY): 16 min   Charges:   PT Evaluation $PT Eval Moderate Complexity: 1 Mod          Deborah Chalk, PT, DPT  Acute Rehabilitation Services Pager [phone] Office [phone]    Alessandra Bevels Cristobal Advani 09/29/2017, 4:31 PM

## 2017-09-29 NOTE — Care Management Important Message (Signed)
Important Message  Patient Details  Name: Ellen Barajas MRN: [id number] Date of Birth: [birth year]   Medicare Important Message Given:  Yes    Rhiana Morash 09/29/2017, 3:57 PM

## 2017-09-29 NOTE — Progress Notes (Signed)
PROGRESS NOTE  Ellen Barajas:[id number] DOB: 07-Oct-1928 DOA: 09/27/2017 PCP: Eustaquio Boyden, MD  Brief Summary:  patient fell in the parking lot while trying to load groceries in her car.  She said someone was helping her and she asked them to not help her when she did she turned and lost her balance and fell.  Landed on her left side of her bottom.    HPI/Recap of past 24 hours:  Post op day one She is sitting up in chair, currently denies pain  perRN she has hypoxia when she tried to work with therapist, patient denies chest pain, no sob  Assessment/Plan: Active Problems:   Persistent atrial fibrillation (HCC)   IHD (ischemic heart disease)   Hx of CABG   Hip fracture (HCC)  Left hip fx s/p mechanical fall -patient at baseline Very active, takes care of her husband at home, no difficulty walking, no exertional symptoms. RCRI 1. -IM nailing on 9/5 -She may WBAT on LLE per ortho _ortho oked to resume coumadin -start therapy    Chronic afib on coumadin: -rate controlled -INR on presentation is 1.58 -coumadin held in preparation for surgery, ortho oked to resume coumadin   CAD s/p CABG in 2002 -stable, no chest pain, no sob, no acute EKG changes -continue home meds  Impaired memory Not oriented to time at baseline  Hypoxia? Patient does not appear symptomatic, she denies sob, no cough, cxr no acute findings, she is on anticoagulation with therapeutic INR  Monitor , wean oxygen as tolerated  Code Status: full  Family Communication: patient  Disposition Plan: pending PT eval   Consultants:  ortho  Procedures:  IM nailing to left hip on 9/5  Antibiotics:  perioperative   Objective: BP 110/62 (BP Location: Left Arm)   Pulse 76   Temp 98 F (36.7 C) (Oral)   Resp 16   Ht 5' (1.524 m)   Wt 67.5 kg   SpO2 100%   BMI 29.06 kg/m   Intake/Output Summary (Last 24 hours) at 09/29/2017 2952 Last data filed at 09/28/2017 1830 Gross per 24 hour    Intake 640 ml  Output 100 ml  Net 540 ml   Filed Weights   09/27/17 2000  Weight: 67.5 kg    Exam: Patient is examined daily including today on 09/29/2017, exams remain the same as of yesterday except that has changed    General:  NAD, hard of hearing  Cardiovascular: IRRR, + murmur ( reports chronic)  Respiratory: CTABL  Abdomen: Soft/ND/NT, positive BS  Musculoskeletal: No Edema  Neuro: alert, oriented to place and person, not to time, reports chronic   Data Reviewed: Basic Metabolic Panel: Recent Labs  Lab 09/27/17 1522 09/29/17 0438  NA 142 140  K 4.3 4.1  CL 108 104  CO2 23 28  GLUCOSE 93 136*  BUN 17 17  CREATININE 0.83 0.88  CALCIUM 9.2 8.4*  MG  --  2.1   Liver Function Tests: No results for input(s): AST, ALT, ALKPHOS, BILITOT, PROT, ALBUMIN in the last 168 hours. No results for input(s): LIPASE, AMYLASE in the last 168 hours. No results for input(s): AMMONIA in the last 168 hours. CBC: Recent Labs  Lab 09/27/17 1522 09/28/17 0414 09/29/17 0438  WBC 6.9 8.0 7.6  NEUTROABS 5.1  --   --   HGB 12.9 11.5* 10.2*  HCT 41.2 36.4 32.5*  MCV 95.8 94.1 95.0  PLT 218 197 189   Cardiac Enzymes:   No results  for input(s): CKTOTAL, CKMB, CKMBINDEX, TROPONINI in the last 168 hours. BNP (last 3 results) No results for input(s): BNP in the last 8760 hours.  ProBNP (last 3 results) No results for input(s): PROBNP in the last 8760 hours.  CBG: No results for input(s): GLUCAP in the last 168 hours.  Recent Results (from the past 240 hour(s))  MRSA PCR Screening     Status: None   Collection Time: 09/27/17  6:46 PM  Result Value Ref Range Status   MRSA by PCR NEGATIVE NEGATIVE Final    Comment:        The GeneXpert MRSA Assay (FDA approved for NASAL specimens only), is one component of a comprehensive MRSA colonization surveillance program. It is not intended to diagnose MRSA infection nor to guide or monitor treatment for MRSA  infections. Performed at [facility], [address], [place], [place] [address]   Surgical pcr screen     Status: None   Collection Time: 09/28/17  1:16 AM  Result Value Ref Range Status   MRSA, PCR NEGATIVE NEGATIVE Final   Staphylococcus aureus NEGATIVE NEGATIVE Final    Comment: (NOTE) The Xpert SA Assay (FDA approved for NASAL specimens in patients 44 years of age and older), is one component of a comprehensive surveillance program. It is not intended to diagnose infection nor to guide or monitor treatment. Performed at [facility], [address] [place], [place] [address]      Studies: Pelvis Portable  Result Date: 09/28/2017 CLINICAL DATA:  Operative fixation of a left intertrochanteric fracture. EXAM: PORTABLE PELVIS 1-2 VIEWS COMPARISON:  Yesterday. FINDINGS: Interval intramedullary rod and compression screw fixation of the previously demonstrated left intertrochanteric fracture. Mild lateral and proximal displacement of the distal fragment. Mild valgus angulation. Diffuse osteopenia. IMPRESSION: Hardware fixation of the previously demonstrated left intertrochanteric fracture. Electronically Signed   By: Beckie Salts M.D.   On: 09/28/2017 11:54   Dg C-arm 1-60 Min  Result Date: 09/28/2017 CLINICAL DATA:  LEFT femoral nailing of an intertrochanteric fracture of the LEFT femur EXAM: LEFT FEMUR 2 VIEWS; DG C-ARM 61-120 MIN COMPARISON:  09/27/2017 FLUOROSCOPY TIME:  0 minutes 32 seconds Images obtained: 5 Dose: 4.75 mGy FINDINGS: Images demonstrate diffuse osseous demineralization. Hip joint space preserved. Sequential placement of an IM nail and compression screw at the proximal LEFT femur post ORIF of a reduced intertrochanteric fracture. No dislocation or additional fracture identified. IMPRESSION: Post ORIF LEFT femur. Electronically Signed   By: Ulyses Southward M.D.   On: 09/28/2017 11:55   Dg Femur Min 2 Views Left  Result Date: 09/28/2017 CLINICAL DATA:  LEFT femoral  nailing of an intertrochanteric fracture of the LEFT femur EXAM: LEFT FEMUR 2 VIEWS; DG C-ARM 61-120 MIN COMPARISON:  09/27/2017 FLUOROSCOPY TIME:  0 minutes 32 seconds Images obtained: 5 Dose: 4.75 mGy FINDINGS: Images demonstrate diffuse osseous demineralization. Hip joint space preserved. Sequential placement of an IM nail and compression screw at the proximal LEFT femur post ORIF of a reduced intertrochanteric fracture. No dislocation or additional fracture identified. IMPRESSION: Post ORIF LEFT femur. Electronically Signed   By: Ulyses Southward M.D.   On: 09/28/2017 11:55    Scheduled Meds: . acetaminophen  500 mg Oral Q6H  . docusate sodium  100 mg Oral BID  . Warfarin - Pharmacist Dosing Inpatient   Does not apply q1800    Continuous Infusions:   Time spent: I have personally reviewed and interpreted on  09/29/2017 daily labs,  imagings as discussed above under date review session and assessment and plans.  I reviewed all nursing notes, pharmacy notes, consultant notes,  vitals, pertinent old records  I have discussed plan of care as described above with RN , patient  on 09/29/2017   Albertine Grates MD, PhD  [facility] Pager [phone]. If 7PM-7AM, please contact night-coverage at [email or web address], password [facility] 09/29/2017, 8:08 AM  LOS: 2 days

## 2017-09-29 NOTE — Progress Notes (Signed)
Spoke with patient's niece this afternoon.  If patient discharges to SNF she would prefer [facility] if possible

## 2017-09-29 NOTE — Progress Notes (Signed)
Patient ID: Ellen Barajas, female   DOB: July 28, 1928, 84 y.o.   MRN: [id number]   LOS: 2 days   Subjective: Doing well, pain controlled. Eager to work with PT and get home.   Objective: Vital signs in last 24 hours: Temp:  [97.4 F (36.3 C)-98.1 F (36.7 C)] 98 F (36.7 C) (09/06 0514) Pulse Rate:  [58-102] 76 (09/06 0514) Resp:  [12-24] 16 (09/06 0514) BP: (95-174)/(38-111) 110/62 (09/06 0514) SpO2:  [96 %-100 %] 100 % (09/06 0514) Last BM Date: 09/27/17   Laboratory  CBC Recent Labs    09/28/17 0414 09/29/17 0438  WBC 8.0 7.6  HGB 11.5* 10.2*  HCT 36.4 32.5*  PLT 197 189   BMET Recent Labs    09/27/17 1522 09/29/17 0438  NA 142 140  K 4.3 4.1  CL 108 104  CO2 23 28  GLUCOSE 93 136*  BUN 17 17  CREATININE 0.83 0.88  CALCIUM 9.2 8.4*     Physical Exam General appearance: alert and no distress  LLE: Incisions C/D/I, EHL intact, foot NVI   Assessment/Plan: Left hip fx s/p IMN -- PT/OT, may discharge when medically appropriate. She may WBAT on LLE. F/u with Dr. Eulah Pont in 2 weeks.    Freeman Caldron, PA-C Orthopedic Surgery [phone] 09/29/2017

## 2017-09-29 NOTE — Progress Notes (Signed)
ANTICOAGULATION CONSULT NOTE  Pharmacy Consult:  Coumadin Indication:  Afib  Allergies  Allergen Reactions  . Crestor [Rosuvastatin Calcium]   . Lipitor [Atorvastatin Calcium]   . Niaspan [Niacin Er]   . Other     PER PATIENT SHE DOES NOT TOLERATE NARCOTICS  . Pravastatin   . Ramipril [Ramipril]     Hyperkalemia  . Welchol [Colesevelam Hcl]   . Zetia [Ezetimibe]     Patient Measurements: Height: 5' (152.4 cm) Weight: 148 lb 13 oz (67.5 kg) IBW/kg (Calculated) : 45.5  Vital Signs: Temp: 98 F (36.7 C) (09/06 0514) Temp Source: Oral (09/06 0514) BP: 110/62 (09/06 0514) Pulse Rate: 76 (09/06 0514)  Labs: Recent Labs    09/27/17 1522 09/28/17 0414 09/29/17 0438  HGB 12.9 11.5* 10.2*  HCT 41.2 36.4 32.5*  PLT 218 197 189  LABPROT 18.7*  --  28.7*  INR 1.58  --  2.73  CREATININE 0.83  --  0.88    Estimated Creatinine Clearance: 38.6 mL/min (by C-G formula based on SCr of 0.88 mg/dL).   Assessment: 49 YOF to resume Coumadin for history of Afib and VTE prophylaxis s/p IM nail left hip.  INR with significant increase from prior INR, 1.58 >> 2.73.  No bleeding reported, Hgb down to 10.2, platelets are normal.  Home Coumadin regimen:  5mg  daily except 2.5mg  on MWF   Goal of Therapy:  INR 2-3 Monitor platelets by anticoagulation protocol: Yes   Plan:  No Coumadin tonight Daily PT / INR   Loura Back, PharmD, BCPS Clinical Pharmacist Clinical phone for 09/29/2017 until 3p is [phone] Please check AMION for all Pharmacist numbers by unit 09/29/2017 12:56 PM

## 2017-09-29 NOTE — Evaluation (Signed)
Occupational Therapy Evaluation Patient Details Name: YARIS FERRELL MRN: [id number] DOB: 10/10/1928 Today's Date: 09/29/2017    History of Present Illness Jahniyah D Burdick is a 84 y.o. year old who fell and suffered a hip fracture. Now s/p INTRAMEDULLARY (IM) NAIL FEMORAL. PMH atrial fibrillation, CABG.   Clinical Impression   Pt admitted with the above diagnoses and presents with below problem list. Pt will benefit from continued acute OT to address the below listed deficits and maximize independence with basic ADLs prior to d/c to venue below. PTA pt was independent with ADLs, very active, community ambulator, drives, and is caregiver to spouse. Pt completed first time OOB this session. Pt is currently mod A for functional transfers (pivotal steps only this session), max A with LB ADLs. Pt very eager to d/c home and very motivated to work with therapy. She struggled this session with transfers/mobility but given her baseline high activity level hopefully she can achieve outcomes with acute rehab during hospitalization such that she can d/c directly home. Will continue to follow and update recommendations as appropriate. Of note, attempted unsuccessful trial of RA this session. Pt placed back on 1L supplemental O2.     Follow Up Recommendations  Home health OT;Supervision/Assistance - 24 hour;Other (comment)(pending progress)    Equipment Recommendations  3 in 1 bedside commode    Recommendations for Other Services PT consult     Precautions / Restrictions Precautions Precautions: Fall Restrictions Weight Bearing Restrictions: Yes LLE Weight Bearing: Weight bearing as tolerated      Mobility Bed Mobility Overal bed mobility: Needs Assistance Bed Mobility: Supine to Sit     Supine to sit: Mod assist;HOB elevated     General bed mobility comments: Assist to advance LLE. Used pad to pivot L hip to full EOB position. Rails and at times therapist arm used to powerup  trunk.  Transfers Overall transfer level: Needs assistance Equipment used: Rolling walker (2 wheeled) Transfers: Sit to/from Stand Sit to Stand: Mod assist;From elevated surface         General transfer comment: assist to powerup from elevated seat height. Therapist stabilized rw as pt preferred to pull up with both hands on rw. Assist to position LLE prior to standing. Cues for technique and sequencing with rw.    Balance Overall balance assessment: Needs assistance         Standing balance support: Bilateral upper extremity supported Standing balance-Leahy Scale: Poor Standing balance comment: rw for balance                           ADL either performed or assessed with clinical judgement   ADL Overall ADL's : Needs assistance/impaired Eating/Feeding: Set up;Sitting   Grooming: Set up;Sitting   Upper Body Bathing: Set up;Sitting   Lower Body Bathing: Maximal assistance;Sit to/from stand   Upper Body Dressing : Set up;Sitting   Lower Body Dressing: Maximal assistance;Sit to/from stand   Toilet Transfer: Minimal assistance;BSC;RW;Stand-pivot   Toileting- Clothing Manipulation and Hygiene: Maximal assistance;Sit to/from stand   Tub/ Shower Transfer: Tub transfer;Maximal assistance;Stand-pivot;3 in 1;Rolling walker   Functional mobility during ADLs: Minimal assistance;Rolling walker(pivotal steps EOB>recliner) General ADL Comments: Pt completed bed mobility, pivotal steps to recliner. Assist to powerup and control descent of transfers. Fearful of falling. Slow guarded movements, cues for sequencing with rw.      Vision         Perception     Praxis  Pertinent Vitals/Pain Pain Assessment: Faces Faces Pain Scale: Hurts even more Pain Location: LLE with movement Pain Descriptors / Indicators: Throbbing;Sore Pain Intervention(s): Limited activity within patient's tolerance;Monitored during session;Repositioned     Hand Dominance      Extremity/Trunk Assessment Upper Extremity Assessment Upper Extremity Assessment: Generalized weakness   Lower Extremity Assessment Lower Extremity Assessment: Defer to PT evaluation       Communication Communication Communication: No difficulties   Cognition Arousal/Alertness: Awake/alert Behavior During Therapy: WFL for tasks assessed/performed Overall Cognitive Status: Within Functional Limits for tasks assessed                                 General Comments: some repetition of stories at noted at end of session   General Comments  Trialed pt on RA during transfer with sats in upper 70s/low 80s. Did not recover sufficently enough with breathing techniques and time so reapplied [place] at 1 L with sats in low 90s at end of session.    Exercises     Shoulder Instructions      Home Living Family/patient expects to be discharged to:: Private residence Living Arrangements: Spouse/significant other Available Help at Discharge: Family Type of Home: House Home Access: Stairs to enter [employer] of Steps: 5   Home Layout: Two level;Able to live on main level with bedroom/bathroom     Bathroom Shower/Tub: Chief Strategy Officer: Standard     Home Equipment: Grab bars - tub/shower          Prior Functioning/Environment Level of Independence: Independent        Comments: very active, community amb, drives, caregiver for spouse        OT Problem List: Impaired balance (sitting and/or standing);Decreased knowledge of use of DME or AE;Decreased knowledge of precautions;Pain      OT Treatment/Interventions: Self-care/ADL training;DME and/or AE instruction;Therapeutic activities;Patient/family education;Balance training    OT Goals(Current goals can be found in the care plan section) Acute Rehab OT Goals Patient Stated Goal: do whatever I have to do to get home as soon as possible OT Goal Formulation: With patient Time For Goal  Achievement: 10/06/17 Potential to Achieve Goals: Good ADL Goals Pt Will Perform Grooming: with supervision;standing Pt Will Perform Lower Body Bathing: with min guard assist;sit to/from stand Pt Will Perform Lower Body Dressing: with min guard assist;sit to/from stand Pt Will Transfer to Toilet: with min guard assist;ambulating Pt Will Perform Toileting - Clothing Manipulation and hygiene: with min guard assist;sit to/from stand Pt Will Perform Tub/Shower Transfer: Tub transfer;with min guard assist;ambulating;3 in 1;rolling walker Additional ADL Goal #1: Pt will complete bed mobiltiy with min guard assist to prepare for OOB ADLs.  OT Frequency: Min 3X/week   Barriers to D/C:    pt is caregiver to spouse. Family also assisting,       Co-evaluation              AM-PAC PT "6 Clicks" Daily Activity     Outcome Measure Help from another person eating meals?: None Help from another person taking care of personal grooming?: None Help from another person toileting, which includes using toliet, bedpan, or urinal?: A Lot Help from another person bathing (including washing, rinsing, drying)?: A Lot Help from another person to put on and taking off regular upper body clothing?: A Little Help from another person to put on and taking off regular lower body clothing?: None 6  Click Score: 19   End of Session Equipment Utilized During Treatment: Gait belt;Rolling walker Nurse Communication: Mobility status;Other (comment)(O2 sats, trialed RA, reapplied [place])  Activity Tolerance: Patient limited by pain;Patient tolerated treatment well Patient left: in chair;with call bell/phone within reach;with chair alarm set  OT Visit Diagnosis: Unsteadiness on feet (R26.81);Pain Pain - Right/Left: Left Pain - part of body: Leg                Time: 1221-1250 OT Time Calculation (min): 29 min Charges:  OT General Charges $OT Visit: 1 Visit OT Evaluation $OT Eval Low Complexity: 1 Low OT  Treatments $Self Care/Home Management : 8-22 mins    Pilar Grammes 09/29/2017, 1:28 PM

## 2017-09-30 LAB — BASIC METABOLIC PANEL
Anion gap: 10 (ref 5–15)
BUN: 17 mg/dL (ref 8–23)
CALCIUM: 8.6 mg/dL — AB (ref 8.9–10.3)
CO2: 27 mmol/L (ref 22–32)
CREATININE: 0.82 mg/dL (ref 0.44–1.00)
Chloride: 103 mmol/L (ref 98–111)
GFR calc Af Amer: >60 mL/min (ref >60)
GFR calc non Af Amer: >60 mL/min (ref >60)
Glucose, Bld: 110 mg/dL — ABNORMAL HIGH (ref 70–99)
Potassium: 4.6 mmol/L (ref 3.5–5.1)
SODIUM: 140 mmol/L (ref 135–145)

## 2017-09-30 LAB — CBC WITH DIFFERENTIAL/PLATELET
Abs Immature Granulocytes: 0.1 10*3/uL (ref 0.0–0.1)
BASOS ABS: 0 10*3/uL (ref 0.0–0.1)
BASOS PCT: 0 %
Eosinophils Absolute: 0.3 10*3/uL (ref 0.0–0.7)
Eosinophils Relative: 4 %
HCT: 31.6 % — ABNORMAL LOW (ref 36.0–46.0)
Hemoglobin: 9.8 g/dL — ABNORMAL LOW (ref 12.0–15.0)
Immature Granulocytes: 1 %
Lymphocytes Relative: 14 %
Lymphs Abs: 1.1 10*3/uL (ref 0.7–4.0)
MCH: 29.7 pg (ref 26.0–34.0)
MCHC: 31 g/dL (ref 30.0–36.0)
MCV: 95.8 fL (ref 78.0–100.0)
MONO ABS: 0.9 10*3/uL (ref 0.1–1.0)
Monocytes Relative: 11 %
NEUTROS ABS: 5.4 10*3/uL (ref 1.7–7.7)
Neutrophils Relative %: 70 %
Platelets: 191 10*3/uL (ref 150–400)
RBC: 3.3 MIL/uL — ABNORMAL LOW (ref 3.87–5.11)
RDW: 13.8 % (ref 11.5–15.5)
WBC: 7.7 10*3/uL (ref 4.0–10.5)

## 2017-09-30 LAB — PROTIME-INR
INR: 2.12
Prothrombin Time: 23.5 seconds — ABNORMAL HIGH (ref 11.4–15.2)

## 2017-09-30 MED ORDER — WARFARIN SODIUM 5 MG PO TABS
5.0000 mg | ORAL_TABLET | Freq: Once | ORAL | Status: AC
  Administered 2017-09-30: 5 mg via ORAL
  Filled 2017-09-30: qty 1

## 2017-09-30 NOTE — Progress Notes (Signed)
PROGRESS NOTE  Ellen Barajas JWJ:[id number] DOB: 1928/02/22 DOA: 09/27/2017 PCP: Eustaquio Boyden, MD  Brief Summary:  patient fell in the parking lot while trying to load groceries in her car.  She said someone was helping her and she asked them to not help her when she did she turned and lost her balance and fell.  Landed on her left side of her bottom.    HPI/Recap of past 24 hours:  Post op day two She currently denies pain  perRN she has hypoxia when she tried to work with therapist, patient denies chest pain, no sob  Assessment/Plan: Active Problems:   Persistent atrial fibrillation (HCC)   IHD (ischemic heart disease)   Hx of CABG   Hip fracture (HCC)  Left hip fx s/p mechanical fall -patient at baseline Very active, takes care of her husband at home, no difficulty walking, no exertional symptoms. RCRI 1. -IM nailing on 9/5 -She may WBAT on LLE per ortho _ortho oked to resume coumadin -start therapy    Chronic afib on coumadin: -rate controlled -INR on presentation is 1.58 -coumadin held in preparation for surgery, ortho oked to resume coumadin   CAD s/p CABG in 2002 -stable, no chest pain, no sob, no acute EKG changes -continue home meds  Impaired memory Not oriented to time at baseline  Hypoxia? Patient does not appear symptomatic, she denies sob, no cough, cxr no acute findings, she is on anticoagulation with therapeutic INR  Monitor , ambulate, wean oxygen as tolerated  Code Status: full  Family Communication: patient and family Disposition Plan: SNF   Consultants:  ortho  Procedures:  IM nailing to left hip on 9/5  Antibiotics:  perioperative   Objective: BP 134/65 (BP Location: Left Arm)   Pulse (!) 106   Temp 98.5 F (36.9 C) (Oral)   Resp 16   Ht 5' (1.524 m)   Wt 67.5 kg   SpO2 99%   BMI 29.06 kg/m   Intake/Output Summary (Last 24 hours) at 09/30/2017 6213 Last data filed at 09/29/2017 1500 Gross per 24 hour  Intake 240 ml    Output -  Net 240 ml   Filed Weights   09/27/17 2000  Weight: 67.5 kg    Exam: Patient is examined daily including today on 09/30/2017, exams remain the same as of yesterday except that has changed    General:  NAD, hard of hearing  Cardiovascular: IRRR, + murmur ( reports chronic)  Respiratory: CTABL  Abdomen: Soft/ND/NT, positive BS  Musculoskeletal: No Edema  Neuro: alert, oriented to place and person, not to time, reports chronic   Data Reviewed: Basic Metabolic Panel: Recent Labs  Lab 09/27/17 1522 09/29/17 0438 09/30/17 0506  NA 142 140 140  K 4.3 4.1 4.6  CL 108 104 103  CO2 23 28 27   GLUCOSE 93 136* 110*  BUN 17 17 17   CREATININE 0.83 0.88 0.82  CALCIUM 9.2 8.4* 8.6*  MG  --  2.1  --    Liver Function Tests: No results for input(s): AST, ALT, ALKPHOS, BILITOT, PROT, ALBUMIN in the last 168 hours. No results for input(s): LIPASE, AMYLASE in the last 168 hours. No results for input(s): AMMONIA in the last 168 hours. CBC: Recent Labs  Lab 09/27/17 1522 09/28/17 0414 09/29/17 0438 09/30/17 0506  WBC 6.9 8.0 7.6 7.7  NEUTROABS 5.1  --   --  5.4  HGB 12.9 11.5* 10.2* 9.8*  HCT 41.2 36.4 32.5* 31.6*  MCV 95.8 94.1  95.0 95.8  PLT 218 197 189 191   Cardiac Enzymes:   No results for input(s): CKTOTAL, CKMB, CKMBINDEX, TROPONINI in the last 168 hours. BNP (last 3 results) No results for input(s): BNP in the last 8760 hours.  ProBNP (last 3 results) No results for input(s): PROBNP in the last 8760 hours.  CBG: No results for input(s): GLUCAP in the last 168 hours.  Recent Results (from the past 240 hour(s))  MRSA PCR Screening     Status: None   Collection Time: 09/27/17  6:46 PM  Result Value Ref Range Status   MRSA by PCR NEGATIVE NEGATIVE Final    Comment:        The GeneXpert MRSA Assay (FDA approved for NASAL specimens only), is one component of a comprehensive MRSA colonization surveillance program. It is not intended to diagnose  MRSA infection nor to guide or monitor treatment for MRSA infections. Performed at [facility], [address], [place], [place] [address]   Surgical pcr screen     Status: None   Collection Time: 09/28/17  1:16 AM  Result Value Ref Range Status   MRSA, PCR NEGATIVE NEGATIVE Final   Staphylococcus aureus NEGATIVE NEGATIVE Final    Comment: (NOTE) The Xpert SA Assay (FDA approved for NASAL specimens in patients 29 years of age and older), is one component of a comprehensive surveillance program. It is not intended to diagnose infection nor to guide or monitor treatment. Performed at [facility], [address] [place], [place] [address]      Studies: No results found.  Scheduled Meds: . docusate sodium  100 mg Oral BID  . Warfarin - Pharmacist Dosing Inpatient   Does not apply q1800    Continuous Infusions:   Time spent: I have personally reviewed and interpreted on  09/30/2017 daily labs,  imagings as discussed above under date review session and assessment and plans.  I reviewed all nursing notes, pharmacy notes, consultant notes,  vitals, pertinent old records  I have discussed plan of care as described above with RN , patient and family  on 09/30/2017   Albertine Grates MD, PhD  [facility] Pager [phone]. If 7PM-7AM, please contact night-coverage at [email or web address], password [facility] 09/30/2017, 8:28 AM  LOS: 3 days

## 2017-09-30 NOTE — Progress Notes (Signed)
ANTICOAGULATION CONSULT NOTE  Pharmacy Consult:  Coumadin Indication:  Afib  Allergies  Allergen Reactions  . Crestor [Rosuvastatin Calcium]   . Lipitor [Atorvastatin Calcium]   . Niaspan [Niacin Er]   . Other     PER PATIENT SHE DOES NOT TOLERATE NARCOTICS  . Pravastatin   . Ramipril [Ramipril]     Hyperkalemia  . Welchol [Colesevelam Hcl]   . Zetia [Ezetimibe]     Patient Measurements: Height: 5' (152.4 cm) Weight: 148 lb 13 oz (67.5 kg) IBW/kg (Calculated) : 45.5  Vital Signs: Temp: 98.8 F (37.1 C) (09/07 1547) Temp Source: Oral (09/07 1547) BP: 140/47 (09/07 1547) Pulse Rate: 95 (09/07 1547)  Labs: Recent Labs    09/28/17 0414 09/29/17 0438 09/30/17 0506  HGB 11.5* 10.2* 9.8*  HCT 36.4 32.5* 31.6*  PLT 197 189 191  LABPROT  --  28.7* 23.5*  INR  --  2.73 2.12  CREATININE  --  0.88 0.82    Estimated Creatinine Clearance: 41.4 mL/min (by C-G formula based on SCr of 0.82 mg/dL).   Assessment: 42 YOF to resume Coumadin for history of Afib and VTE prophylaxis s/p IM nail left hip.  INR now down from 2.73 to 2.12. Will plan on resuming home regiment tonight. No bleeding reported, Hgb down to 9.8, platelets are normal.  Home Coumadin regimen:  5mg  daily except 2.5mg  on MWF   Goal of Therapy:  INR 2-3 Monitor platelets by anticoagulation protocol: Yes   Plan:  Warfarin 5 mg x 1 tonight  Daily PT / INR  Lenord Carbo, PharmD PGY1 Pharmacy Resident Please check AMION for all Pharmacist numbers by unit 09/30/2017 3:58 PM

## 2017-09-30 NOTE — Clinical Social Work Note (Signed)
Clinical Social Work Assessment  Patient Details  Name: Ellen Barajas MRN: [id number] Date of Birth: 12-27-1928  Date of referral:  09/30/17               Reason for consult:  Facility Placement                Permission sought to share information with:  Family Supports Permission granted to share information::  Yes, Verbal Permission Granted  Name::     Agricultural consultant::  [facility]  Relationship::  Daughter, POA  Contact Information:     Housing/Transportation Living arrangements for the past 2 months:  Single Family Home Source of Information:  Patient, Adult Children, Power of Attorney Patient Interpreter Needed:  None Criminal Activity/Legal Involvement Pertinent to Current Situation/Hospitalization:  No - Comment as needed Significant Relationships:  Adult Children, Spouse Lives with:  Spouse Do you feel safe going back to the place where you live?  No Need for family participation in patient care:  No (Coment)  Care giving concerns:  Pt is alert and oriented. CSW spoke with pt at bedside. Shortly after interaction pt's daughter arrived and requested to speak to CSW as well.   Social Worker assessment / plan:  CSW spoke with pt at bedside. Pt was living at home with spouse prior to admission--pt's spouse is bed ridden. Per CSW and pt's conversation pt really desires to go home however, pt understands that if she needs to go to SNF she will. Pt is agreeable to daughter preference, Education officer, museum.  CSW spoke with pt's daughter/POA. Pt's daughter states she also cares for her dad who is hopsice care at home. Pt's daughter states with Respite services it is possible that if pt goes to [facility], (respite is contracted with [facility]) that maybe pt's father could go as well. Pt's daughter states pt's father is very upset ever since pt was hospitalized. CSW let pt's daughter know that she will try to find out if [facility] will do this. CSW will follow up with facility and pt's daughter.  Employment  status:  Retired Database administrator PT Recommendations:  Skilled Nursing Facility Information / Referral to community resources:  Skilled Nursing Facility  Patient/Family's Response to care:  Pt verbalized understanding of CSW role and expressed appreciation for support. Pt denies any concern regarding pt care at this time.   Patient/Family's Understanding of and Emotional Response to Diagnosis, Current Treatment, and Prognosis:  Pt understanding and realistic regarding physical limitations. Pt understands the need for SNF placement at d/c. Pt agreeable to SNF placement at d/c, at this time. Pt's responses emotionally appropriate during conversation with CSW. Pt denies any concern regarding treatment plan at this time. CSW will continue to provide support and facilitate d/c needs.    Emotional Assessment Appearance:  Appears stated age Attitude/Demeanor/Rapport:  (Patient was appropriate) Affect (typically observed):  Accepting, Appropriate, Calm Orientation:  Oriented to Self, Oriented to  Time, Oriented to Place, Oriented to Situation Alcohol / Substance use:  Not Applicable Psych involvement (Current and /or in the community):  No (Comment)  Discharge Needs  Concerns to be addressed:  Basic Needs, Care Coordination Readmission within the last 30 days:  No Current discharge risk:  Dependent with Mobility Barriers to Discharge:  Continued Medical Work up   [employer], LCSW 09/30/2017, 1:12 PM

## 2017-09-30 NOTE — Progress Notes (Signed)
Patient pivoted to chair with 2 staff members. Was not able to take more than one step. Tolerated movement well without increased pain. Oxygen removed and O2 saturation remained at 97%. Will continue to monitor patient. Peggye Ley, RN

## 2017-09-30 NOTE — Progress Notes (Signed)
Subjective: 2 Days Post-Op Procedure(s) (LRB): INTRAMEDULLARY (IM) NAIL FEMORAL (Left) Patient reports pain as mild.    Objective: Vital signs in last 24 hours: Temp:  [98.2 F (36.8 C)-98.5 F (36.9 C)] 98.5 F (36.9 C) (09/07 0343) Pulse Rate:  [87-106] 106 (09/07 0343) Resp:  [16] 16 (09/06 1500) BP: (113-134)/(49-65) 134/65 (09/07 0343) SpO2:  [99 %-100 %] 99 % (09/07 0343)  Intake/Output from previous day: 09/06 0701 - 09/07 0700 In: 240 [P.O.:240] Out: -  Intake/Output this shift: Total I/O In: 240 [P.O.:240] Out: 850 [Urine:850]  Recent Labs    09/27/17 1522 09/28/17 0414 09/29/17 0438 09/30/17 0506  HGB 12.9 11.5* 10.2* 9.8*   Recent Labs    09/29/17 0438 09/30/17 0506  WBC 7.6 7.7  RBC 3.42* 3.30*  HCT 32.5* 31.6*  PLT 189 191   Recent Labs    09/29/17 0438 09/30/17 0506  NA 140 140  K 4.1 4.6  CL 104 103  CO2 28 27  BUN 17 17  CREATININE 0.88 0.82  GLUCOSE 136* 110*  CALCIUM 8.4* 8.6*   Recent Labs    09/29/17 0438 09/30/17 0506  INR 2.73 2.12    Neurovascular intact Sensation intact distally Intact pulses distally Dorsiflexion/Plantar flexion intact Incision: dressing C/D/I    Assessment/Plan: 2 Days Post-Op Procedure(s) (LRB): INTRAMEDULLARY (IM) NAIL FEMORAL (Left) Up with therapy  wbat LLE Coumadin covers dvt proph Would like at least bed to chair with nursing today if PT unable to see Plan is likely d/c to SNF per med team looks like plan is for Monday    [facility] 09/30/2017, 1:08 PM

## 2017-10-01 LAB — PROTIME-INR
INR: 1.81
PROTHROMBIN TIME: 20.8 s — AB (ref 11.4–15.2)

## 2017-10-01 MED ORDER — WARFARIN SODIUM 6 MG PO TABS
6.0000 mg | ORAL_TABLET | Freq: Once | ORAL | Status: AC
  Administered 2017-10-01: 6 mg via ORAL
  Filled 2017-10-01: qty 1

## 2017-10-01 NOTE — Progress Notes (Signed)
Subjective: 3 Days Post-Op Procedure(s) (LRB): INTRAMEDULLARY (IM) NAIL FEMORAL (Left) Patient resting comfortably.  Objective: Vital signs in last 24 hours: Temp:  [97.8 F (36.6 C)-98.9 F (37.2 C)] 97.8 F (36.6 C) (09/08 0174) Pulse Rate:  [95-109] 98 (09/08 0638) BP: (100-140)/(47-92) 100/87 (09/08 0638) SpO2:  [98 %-99 %] 99 % (09/08 9449)  Intake/Output from previous day: 09/07 0701 - 09/08 0700 In: 240 [P.O.:240] Out: 1400 [Urine:1400] Intake/Output this shift: No intake/output data recorded.  Recent Labs    09/29/17 0438 09/30/17 0506  HGB 10.2* 9.8*   Recent Labs    09/29/17 0438 09/30/17 0506  WBC 7.6 7.7  RBC 3.42* 3.30*  HCT 32.5* 31.6*  PLT 189 191   Recent Labs    09/29/17 0438 09/30/17 0506  NA 140 140  K 4.1 4.6  CL 104 103  CO2 28 27  BUN 17 17  CREATININE 0.88 0.82  GLUCOSE 136* 110*  CALCIUM 8.4* 8.6*   Recent Labs    09/30/17 0506 10/01/17 0443  INR 2.12 1.81    Incision: dressing C/D/I    Assessment/Plan: 3 Days Post-Op Procedure(s) (LRB): INTRAMEDULLARY (IM) NAIL FEMORAL (Left)  Up with therapy  wbat LLE Coumadin covers dvt proph Would like at least bed to chair with nursing today if PT unable to see Plan is likely d/c to SNF per med team looks like plan is for Monday   [facility] 10/01/2017, 9:24 AM

## 2017-10-01 NOTE — Progress Notes (Signed)
ANTICOAGULATION CONSULT NOTE  Pharmacy Consult:  Coumadin Indication:  Afib  Allergies  Allergen Reactions  . Crestor [Rosuvastatin Calcium]   . Lipitor [Atorvastatin Calcium]   . Niaspan [Niacin Er]   . Other     PER PATIENT SHE DOES NOT TOLERATE NARCOTICS  . Pravastatin   . Ramipril [Ramipril]     Hyperkalemia  . Welchol [Colesevelam Hcl]   . Zetia [Ezetimibe]     Patient Measurements: Height: 5' (152.4 cm) Weight: 148 lb 13 oz (67.5 kg) IBW/kg (Calculated) : 45.5  Vital Signs: Temp: 97.8 F (36.6 C) (09/08 0638) Temp Source: Oral (09/08 2500) BP: 100/87 (09/08 3704) Pulse Rate: 98 (09/08 0638)  Labs: Recent Labs    09/29/17 0438 09/30/17 0506 10/01/17 0443  HGB 10.2* 9.8*  --   HCT 32.5* 31.6*  --   PLT 189 191  --   LABPROT 28.7* 23.5* 20.8*  INR 2.73 2.12 1.81  CREATININE 0.88 0.82  --     Estimated Creatinine Clearance: 41.4 mL/min (by C-G formula based on SCr of 0.82 mg/dL).   Assessment: 72 yoF s/p IM nail left hip.  Home Coumadin regimen:  5mg  daily except 2.5mg  on MWF   INR subtherapeutic today at 1.81 (down from 2.12 yesterday) despite resuming home dosing regimen. We are likely seeing the effects of the held dose of warfarin on 9/6 leading to the subtherapeutic INR today. No documented bleeding, Hgb down to 9.8 yesterday, and platelets are within normal limits. Will give small dose increase tonight to ensure the patient's INR does not fall further below therapeutic range.  Goals of Therapy:  INR 2-3 Monitor platelets by anticoagulation protocol: Yes   Plan:  Warfarin 6 mg x 1 tonight  Daily PT / INR   Harlow Mares, PharmD PGY1 Pharmacy Resident Phone [phone]  10/01/2017   1:59 PM

## 2017-10-01 NOTE — Progress Notes (Signed)
PROGRESS NOTE  Ellen Barajas ZOX:[id number] DOB: 12-09-1928 DOA: 09/27/2017 PCP: Eustaquio Boyden, MD  Brief Summary:  patient fell in the parking lot while trying to load groceries in her car.  She said someone was helping her and she asked them to not help her when she did she turned and lost her balance and fell.  Landed on her left side of her bottom.    HPI/Recap of past 24 hours:  Post op day three She currently denies pain, she is sitting up in chair  She is weaned off oxygen, no hypoxia on room air,  patient denies chest pain, no sob  Family at bedside  Assessment/Plan: Active Problems:   Persistent atrial fibrillation (HCC)   IHD (ischemic heart disease)   Hx of CABG   Hip fracture (HCC)  Left hip fx s/p mechanical fall -patient at baseline Very active, takes care of her husband at home, no difficulty walking, no exertional symptoms. RCRI 1. -IM nailing on 9/5 -She may WBAT on LLE per ortho _ortho oked to resume coumadin -start therapy, snf placement when bedavailable  Chronic afib on coumadin: -rate controlled -INR on presentation is 1.58 -coumadin held in preparation for surgery, ortho oked to resume coumadin   CAD s/p CABG in 2002 -stable, no chest pain, no sob, no acute EKG changes -continue home meds  Impaired memory Not oriented to time at baseline  Hypoxia? Patient does not appear symptomatic, she denies sob, no cough, cxr no acute findings, she is on anticoagulation with therapeutic INR  Monitor , ambulate,  Now off oxygen, no hypoxia on room air  Code Status: full  Family Communication: patient and family Disposition Plan: SNF, hopefully on monday   Consultants:  ortho  Procedures:  IM nailing to left hip on 9/5  Antibiotics:  perioperative   Objective: BP 100/87 (BP Location: Right Leg)   Pulse 98   Temp 97.8 F (36.6 C) (Oral)   Resp 16   Ht 5' (1.524 m)   Wt 67.5 kg   SpO2 99%   BMI 29.06 kg/m   Intake/Output Summary  (Last 24 hours) at 10/01/2017 0830 Last data filed at 10/01/2017 8119 Gross per 24 hour  Intake 240 ml  Output 1400 ml  Net -1160 ml   Filed Weights   09/27/17 2000  Weight: 67.5 kg    Exam: Patient is examined daily including today on 10/01/2017, exams remain the same as of yesterday except that has changed    General:  NAD, hard of hearing  Cardiovascular: IRRR, + murmur ( reports chronic)  Respiratory: CTABL  Abdomen: Soft/ND/NT, positive BS  Musculoskeletal: No Edema  Neuro: alert, oriented to place and person, not to time, reports chronic   Data Reviewed: Basic Metabolic Panel: Recent Labs  Lab 09/27/17 1522 09/29/17 0438 09/30/17 0506  NA 142 140 140  K 4.3 4.1 4.6  CL 108 104 103  CO2 23 28 27   GLUCOSE 93 136* 110*  BUN 17 17 17   CREATININE 0.83 0.88 0.82  CALCIUM 9.2 8.4* 8.6*  MG  --  2.1  --    Liver Function Tests: No results for input(s): AST, ALT, ALKPHOS, BILITOT, PROT, ALBUMIN in the last 168 hours. No results for input(s): LIPASE, AMYLASE in the last 168 hours. No results for input(s): AMMONIA in the last 168 hours. CBC: Recent Labs  Lab 09/27/17 1522 09/28/17 0414 09/29/17 0438 09/30/17 0506  WBC 6.9 8.0 7.6 7.7  NEUTROABS 5.1  --   --  5.4  HGB 12.9 11.5* 10.2* 9.8*  HCT 41.2 36.4 32.5* 31.6*  MCV 95.8 94.1 95.0 95.8  PLT 218 197 189 191   Cardiac Enzymes:   No results for input(s): CKTOTAL, CKMB, CKMBINDEX, TROPONINI in the last 168 hours. BNP (last 3 results) No results for input(s): BNP in the last 8760 hours.  ProBNP (last 3 results) No results for input(s): PROBNP in the last 8760 hours.  CBG: No results for input(s): GLUCAP in the last 168 hours.  Recent Results (from the past 240 hour(s))  MRSA PCR Screening     Status: None   Collection Time: 09/27/17  6:46 PM  Result Value Ref Range Status   MRSA by PCR NEGATIVE NEGATIVE Final    Comment:        The GeneXpert MRSA Assay (FDA approved for NASAL specimens only), is  one component of a comprehensive MRSA colonization surveillance program. It is not intended to diagnose MRSA infection nor to guide or monitor treatment for MRSA infections. Performed at [facility], [address], [place], [place] [address]   Surgical pcr screen     Status: None   Collection Time: 09/28/17  1:16 AM  Result Value Ref Range Status   MRSA, PCR NEGATIVE NEGATIVE Final   Staphylococcus aureus NEGATIVE NEGATIVE Final    Comment: (NOTE) The Xpert SA Assay (FDA approved for NASAL specimens in patients 21 years of age and older), is one component of a comprehensive surveillance program. It is not intended to diagnose infection nor to guide or monitor treatment. Performed at [facility], [address], [place], [place] [address]      Studies: No results found.  Scheduled Meds: . docusate sodium  100 mg Oral BID  . Warfarin - Pharmacist Dosing Inpatient   Does not apply q1800    Continuous Infusions:   Time spent: I have personally reviewed and interpreted on  10/01/2017 daily labs,  imagings as discussed above under date review session and assessment and plans.  I reviewed all nursing notes, pharmacy notes, consultant notes,  vitals, pertinent old records  I have discussed plan of care as described above with RN , patient and family  on 10/01/2017   Albertine Grates MD, PhD  [facility] Pager [phone]. If 7PM-7AM, please contact night-coverage at [email or web address], password [facility] 10/01/2017, 8:30 AM  LOS: 4 days

## 2017-10-01 NOTE — NC FL2 (Signed)
[place] MEDICAID [id number] LEVEL OF CARE SCREENING TOOL     IDENTIFICATION  Patient Name: Ellen Barajas Birthdate: May 20, 1928 Sex: female Admission Date (Current Location): 09/27/2017  [facility] and [place] Number:  Producer, television/film/video and Address:  [facility], [address] [place], [place] [address]      Provider Number: [id number]  Attending Physician Name and Address:  Albertine Grates, MD  Relative Name and Phone Number:       Current Level of Care: Hospital Recommended Level of Care: Skilled Nursing Facility Prior Approval Number:    Date Approved/Denied:   PASRR Number:   [id number]  Discharge Plan: SNF    Current Diagnoses: Patient Active Problem List   Diagnosis Date Noted  . Hip fracture (HCC) 09/27/2017  . Health maintenance examination 09/21/2016  . Essential hypertension 04/11/2016  . Medicare annual wellness visit, subsequent 10/16/2014  . Advanced care planning/counseling discussion 10/16/2014  . DNR (do not resuscitate) 10/16/2014  . Encounter for therapeutic drug monitoring 05/27/2014  . Osteoarthritis 04/19/2013  . Hx of CABG 12/17/2012  . Post herpetic neuralgia 02/25/2012  . IHD (ischemic heart disease)   . Dyslipidemia   . CAD (coronary artery disease)   . Persistent atrial fibrillation (HCC) 04/19/2010    Orientation RESPIRATION BLADDER Height & Weight     Self, Time, Situation, Place  Normal Continent Weight: 67.5 kg Height:  5' (152.4 cm)  BEHAVIORAL SYMPTOMS/MOOD NEUROLOGICAL BOWEL NUTRITION STATUS      Continent Diet(please see discharge summary. )  AMBULATORY STATUS COMMUNICATION OF NEEDS Skin   Extensive Assist Verbally Surgical wounds                       Personal Care Assistance Level of Assistance  Bathing, Feeding, Dressing Bathing Assistance: Maximum assistance   Dressing Assistance: Maximum assistance     Functional Limitations Info  Sight, Hearing, Speech Sight Info: Adequate Hearing Info:  Impaired(hard of hearing. ) Speech Info: Adequate    SPECIAL CARE FACTORS FREQUENCY  PT (By licensed PT), OT (By licensed OT)     PT Frequency: 5 times a week OT Frequency: 5 times a week             Contractures Contractures Info: Not present    Additional Factors Info  Code Status, Allergies Code Status Info: Full  Allergies Info: Crestor Rosuvastatin Calcium, Lipitor Atorvastatin Calcium, Niaspan Niacin Er, Other, Pravastatin, Ramipril Ramipril, Welchol Colesevelam Hcl, Zetia Ezetimibe           Current Medications (10/01/2017):  This is the current hospital active medication list Current Facility-Administered Medications  Medication Dose Route Frequency Provider Last Rate Last Dose  . bisacodyl (DULCOLAX) suppository 10 mg  10 mg Rectal Daily PRN Sheral Apley, MD      . docusate sodium (COLACE) capsule 100 mg  100 mg Oral BID Sheral Apley, MD   100 mg at 09/30/17 2119  . HYDROcodone-acetaminophen (NORCO/VICODIN) 5-325 MG per tablet 1-2 tablet  1-2 tablet Oral Q4H PRN Sheral Apley, MD   1 tablet at 09/30/17 [phone]  . metoCLOPramide (REGLAN) tablet 5-10 mg  5-10 mg Oral Q8H PRN Sheral Apley, MD       Or  . metoCLOPramide (REGLAN) injection 5-10 mg  5-10 mg Intravenous Q8H PRN Margarita Rana D, MD      . morphine 2 MG/ML injection 0.5-1 mg  0.5-1 mg Intravenous Q2H PRN Sheral Apley, MD      .  ondansetron (ZOFRAN) tablet 4 mg  4 mg Oral Q6H PRN Sheral Apley, MD       Or  . ondansetron [facility]) injection 4 mg  4 mg Intravenous Q6H PRN Sheral Apley, MD      . polyethylene glycol (MIRALAX / GLYCOLAX) packet 17 g  17 g Oral Daily PRN Sheral Apley, MD      . sodium phosphate (FLEET) 7-19 GM/118ML enema 1 enema  1 enema Rectal Once PRN Sheral Apley, MD      . Warfarin - Pharmacist Dosing Inpatient   Does not apply q1800 [facility]   Stopped at 09/29/17 1749     Discharge Medications: Please see discharge summary for a list of  discharge medications.  Relevant Imaging Results:  Relevant Lab Results:   Additional Information [id number]  Robb Matar, LCSWA

## 2017-10-02 LAB — BASIC METABOLIC PANEL
Anion gap: 8 (ref 5–15)
BUN: 20 mg/dL (ref 8–23)
CALCIUM: 8.6 mg/dL — AB (ref 8.9–10.3)
CO2: 26 mmol/L (ref 22–32)
Chloride: 106 mmol/L (ref 98–111)
Creatinine, Ser: 0.77 mg/dL (ref 0.44–1.00)
GFR calc Af Amer: >60 mL/min (ref >60)
GLUCOSE: 102 mg/dL — AB (ref 70–99)
Potassium: 4.3 mmol/L (ref 3.5–5.1)
SODIUM: 140 mmol/L (ref 135–145)

## 2017-10-02 LAB — CBC
HCT: 30.4 % — ABNORMAL LOW (ref 36.0–46.0)
Hemoglobin: 9.8 g/dL — ABNORMAL LOW (ref 12.0–15.0)
MCH: 30.1 pg (ref 26.0–34.0)
MCHC: 32.2 g/dL (ref 30.0–36.0)
MCV: 93.3 fL (ref 78.0–100.0)
PLATELETS: 241 10*3/uL (ref 150–400)
RBC: 3.26 MIL/uL — ABNORMAL LOW (ref 3.87–5.11)
RDW: 13.2 % (ref 11.5–15.5)
WBC: 6.9 10*3/uL (ref 4.0–10.5)

## 2017-10-02 LAB — PROTIME-INR
INR: 1.82
Prothrombin Time: 20.9 seconds — ABNORMAL HIGH (ref 11.4–15.2)

## 2017-10-02 LAB — MAGNESIUM: MAGNESIUM: 2.3 mg/dL (ref 1.7–2.4)

## 2017-10-02 MED ORDER — POLYETHYLENE GLYCOL 3350 17 G PO PACK: 17.0000 g | PACK | Freq: Every day | ORAL | 0 refills | Status: DC | PRN

## 2017-10-02 MED ORDER — WARFARIN SODIUM 5 MG PO TABS: ORAL_TABLET | ORAL | 0 refills | Status: DC

## 2017-10-02 MED ORDER — LOSARTAN POTASSIUM 25 MG PO TABS: 25.0000 mg | ORAL_TABLET | Freq: Every day | ORAL | 0 refills | Status: DC

## 2017-10-02 MED ORDER — WARFARIN SODIUM 5 MG PO TABS
5.0000 mg | ORAL_TABLET | Freq: Once | ORAL | Status: AC
  Administered 2017-10-02: 5 mg via ORAL
  Filled 2017-10-02: qty 1

## 2017-10-02 NOTE — Clinical Social Work Note (Addendum)
Received VM from [employer] representative regarding patient and approval. Request was made for CSW to return call to [phone]. Call made and message left for case manager. Call made to [facility] with [facility] and update provided re: call from [employer] rep. CSW informed that all they have at this time is semi-private rooms. Talked with daughter [name] and updated her regarding hearing from [employer] representative and only semi-private room at facility.  Genelle Bal, MSW, LCSW Licensed Clinical Social Worker Clinical Social Work Department [employer] [phone]

## 2017-10-02 NOTE — Evaluation (Signed)
Physical Therapy Evaluation Patient Details Name: Ellen Barajas MRN: [id number] DOB: 03-07-1928 Today's Date: 10/02/2017   History of Present Illness  Ellen Barajas is an 84 y.o. year old who fell and suffered a hip fracture. Now s/p INTRAMEDULLARY (IM) NAIL FEMORAL. PMH atrial fibrillation, CABG.  Clinical Impression  Pt admitted with above diagnosis. Pt currently with functional limitations due to the deficits listed below (see PT Problem List). Pt able to ambulate 25' with RW and min A, LLE very fatigued by end of distance. Pt has been compliant with exercises on her own in chair which is helping.  Pt will benefit from skilled PT to increase their independence and safety with mobility to allow discharge to the venue listed below.       Follow Up Recommendations SNF    Equipment Recommendations  None recommended by PT    Recommendations for Other Services       Precautions / Restrictions Precautions Precautions: Fall Restrictions Weight Bearing Restrictions: Yes LLE Weight Bearing: Weight bearing as tolerated      Mobility  Bed Mobility Overal bed mobility: Needs Assistance Bed Mobility: Sit to Supine       Sit to supine: Mod assist   General bed mobility comments: mod A for LE's into bed but pt able to position self once in bed  Transfers Overall transfer level: Needs assistance Equipment used: Rolling walker (2 wheeled) Transfers: Sit to/from Stand Sit to Stand: Min assist         General transfer comment: min A for power up and increased time needed  Ambulation/Gait Ambulation/Gait assistance: Min assist Gait Distance (Feet): 25 Feet Assistive device: Rolling walker (2 wheeled) Gait Pattern/deviations: Step-through pattern;Decreased weight shift to left;Decreased stance time - left Gait velocity: decreased Gait velocity interpretation: <1.31 ft/sec, indicative of household ambulator General Gait Details: vc's for increasing step length R as pt avoiding WB'ing  on L side. Improved with distance but then became very fatigued last 5'  Stairs            Wheelchair Mobility    Modified Rankin (Stroke Patients Only)       Balance Overall balance assessment: Needs assistance Sitting-balance support: Feet supported Sitting balance-Leahy Scale: Fair     Standing balance support: Bilateral upper extremity supported Standing balance-Leahy Scale: Poor Standing balance comment: Relies on RW for support                             Pertinent Vitals/Pain Pain Assessment: Faces Faces Pain Scale: Hurts little more Pain Location: LLE with movement Pain Descriptors / Indicators: Throbbing;Sore Pain Intervention(s): Limited activity within patient's tolerance;Monitored during session    Home Living                        Prior Function                 Hand Dominance        Extremity/Trunk Assessment                Communication      Cognition Arousal/Alertness: Awake/alert Behavior During Therapy: WFL for tasks assessed/performed Overall Cognitive Status: Within Functional Limits for tasks assessed                                 General Comments: Cognition functional during this session. Pt's  hearing aids not working properly requiring PT to repeat instructions at times.       General Comments      Exercises General Exercises - Lower Extremity Ankle Circles/Pumps: AROM;Both;10 reps;Supine Quad Sets: AROM;Both;10 reps;Supine Heel Slides: AROM;Left;10 reps;Supine Straight Leg Raises: Left;AAROM;10 reps;Supine   Assessment/Plan    PT Assessment    PT Problem List         PT Treatment Interventions      PT Goals (Current goals can be found in the Care Plan section)  Acute Rehab PT Goals Patient Stated Goal: get to rehab and then home PT Goal Formulation: With patient Time For Goal Achievement: 10/13/17 Potential to Achieve Goals: Good    Frequency Min 2X/week    Barriers to discharge        Co-evaluation               AM-PAC PT "6 Clicks" Daily Activity  Outcome Measure Difficulty turning over in bed (including adjusting bedclothes, sheets and blankets)?: Unable Difficulty moving from lying on back to sitting on the side of the bed? : Unable Difficulty sitting down on and standing up from a chair with arms (e.g., wheelchair, bedside commode, etc,.)?: Unable Help needed moving to and from a bed to chair (including a wheelchair)?: A Little Help needed walking in hospital room?: A Little Help needed climbing 3-5 steps with a railing? : Total 6 Click Score: 10    End of Session Equipment Utilized During Treatment: Gait belt Activity Tolerance: Patient tolerated treatment well Patient left: in bed;with call bell/phone within reach;with bed alarm set Nurse Communication: Mobility status PT Visit Diagnosis: Other abnormalities of gait and mobility (R26.89);Pain Pain - Right/Left: Left Pain - part of body: Leg    Time: 1420-1439 PT Time Calculation (min) (ACUTE ONLY): 19 min   Charges:     PT Treatments $Gait Training: 8-22 mins        Lyanne Co, PT  Acute Rehab Services  Pager [phone] Office [phone]   Lawana Chambers [place] 10/02/2017, 2:43 PM

## 2017-10-02 NOTE — Progress Notes (Signed)
Occupational Therapy Treatment Patient Details Name: Ellen Barajas MRN: [id number] DOB: 01-May-1928 Today's Date: 10/02/2017    History of present illness Ellen Barajas is an 84 y.o. year old who fell and suffered Ellen hip fracture. Now s/p INTRAMEDULLARY (IM) NAIL FEMORAL. PMH atrial fibrillation, CABG.   OT comments  Pt demonstrating good progress toward OT goals this session. She was able to complete LB dressing tasks with moderate assistance and ambulating simulated toilet transfers with min assist today. Facilitated improved independence with powering to stand and lowering to seated position in preparation for toilet transfers this session. Pt is highly motivated to return to independence. Feel that she will need short-term SNF rehabilitation prior to returning home to maximize independence and updated D/C recommendation accordingly.    Follow Up Recommendations  SNF;Supervision/Assistance - 24 hour    Equipment Recommendations  3 in 1 bedside commode    Recommendations for Other Services PT consult    Precautions / Restrictions Precautions Precautions: Fall Restrictions Weight Bearing Restrictions: Yes LLE Weight Bearing: Weight bearing as tolerated       Mobility Bed Mobility               General bed mobility comments: OOB in recliner on my arrival  Transfers Overall transfer level: Needs assistance Equipment used: Rolling walker (2 wheeled) Transfers: Sit to/from [employer] Sit to Stand: Min assist         General transfer comment: Completed sit<>stand 4x with min assist followed by short distance functional mobility in room    Balance Overall balance assessment: Needs assistance Sitting-balance support: Feet supported Sitting balance-Leahy Scale: Fair     Standing balance support: Bilateral upper extremity supported Standing balance-Leahy Scale: Poor Standing balance comment: Relies on RW for support                            ADL either performed or assessed with clinical judgement   ADL Overall ADL's : Needs assistance/impaired Eating/Feeding: Set up;Sitting               Upper Body Dressing : Set up;Sitting   Lower Body Dressing: Moderate assistance;Sit to/from stand   Toilet Transfer: Minimal assistance;RW;Ambulation Statistician Details (indicate cue type and reason): simulated with sit<>stand followed by short distance functional mobility in room Toileting- Clothing Manipulation and Hygiene: Maximal assistance;Sit to/from stand       Functional mobility during ADLs: Minimal assistance;Rolling walker General ADL Comments: Pt able to increase activity this session.      Vision       Perception     Praxis      Cognition Arousal/Alertness: Awake/alert Behavior During Therapy: WFL for tasks assessed/performed Overall Cognitive Status: Within Functional Limits for tasks assessed                                 General Comments: Cognition functional during this session. Pt's hearing aids not working properly requiring OT to repeat instructions at times.         Exercises     Shoulder Instructions       General Comments Pt on RA throughout session. Pt's neice present during session.     Pertinent Vitals/ Pain       Pain Assessment: Faces Faces Pain Scale: Hurts little more Pain Location: LLE with movement Pain Descriptors / Indicators: Throbbing;Sore Pain Intervention(s): Limited activity within  patient's tolerance;Monitored during session;Repositioned  Home Living                                          Prior Functioning/Environment              Frequency  Min 3X/week        Progress Toward Goals  OT Goals(current goals can now be found in the care plan section)  Progress towards OT goals: Progressing toward goals  Acute Rehab OT Goals Patient Stated Goal: get to rehab and then home OT Goal Formulation: With  patient/family Time For Goal Achievement: 10/06/17 Potential to Achieve Goals: Good  Plan Discharge plan remains appropriate    Co-evaluation                 AM-PAC PT "6 Clicks" Daily Activity     Outcome Measure   Help from another person eating meals?: None Help from another person taking care of personal grooming?: None Help from another person toileting, which includes using toliet, bedpan, or urinal?: Ellen Lot Help from another person bathing (including washing, rinsing, drying)?: Ellen Lot Help from another person to put on and taking off regular upper body clothing?: None Help from another person to put on and taking off regular lower body clothing?: Ellen Lot 6 Click Score: 18    End of Session Equipment Utilized During Treatment: Gait belt;Rolling walker  OT Visit Diagnosis: Unsteadiness on feet (R26.81);Pain Pain - Right/Left: Left Pain - part of body: Leg   Activity Tolerance Patient limited by pain;Patient tolerated treatment well   Patient Left in chair;with call bell/phone within reach;with chair alarm set   Nurse Communication          Time: [phone] OT Time Calculation (min): 24 min  Charges: OT General Charges $OT Visit: 1 Visit OT Treatments $Self Care/Home Management : 23-37 mins  Derrell Lolling, OTR/L Acute Rehabilitation Services Pager [phone] Office [phone]    [facility] Ellen Barajas 10/02/2017, 10:43 AM

## 2017-10-02 NOTE — Plan of Care (Signed)
  Problem: Education: Goal: Knowledge of General Education information will improve Description Including pain rating scale, medication(s)/side effects and non-pharmacologic comfort measures Outcome: Progressing   Problem: Clinical Measurements: Goal: Ability to maintain clinical measurements within normal limits will improve Outcome: Progressing Goal: Will remain free from infection Outcome: Progressing Goal: Diagnostic test results will improve Outcome: Progressing Goal: Respiratory complications will improve Outcome: Progressing   Problem: Activity: Goal: Risk for activity intolerance will decrease Outcome: Progressing   Problem: Nutrition: Goal: Adequate nutrition will be maintained Outcome: Progressing   Problem: Coping: Goal: Level of anxiety will decrease Outcome: Progressing   Problem: Pain Managment: Goal: General experience of comfort will improve Outcome: Progressing

## 2017-10-02 NOTE — Progress Notes (Signed)
ANTICOAGULATION CONSULT NOTE  Pharmacy Consult:  Coumadin Indication:  Afib  Allergies  Allergen Reactions  . Crestor [Rosuvastatin Calcium]   . Lipitor [Atorvastatin Calcium]   . Niaspan [Niacin Er]   . Other     PER PATIENT SHE DOES NOT TOLERATE NARCOTICS  . Pravastatin   . Ramipril [Ramipril]     Hyperkalemia  . Welchol [Colesevelam Hcl]   . Zetia [Ezetimibe]     Patient Measurements: Height: 5' (152.4 cm) Weight: 148 lb 13 oz (67.5 kg) IBW/kg (Calculated) : 45.5  Vital Signs: Temp: 98.3 F (36.8 C) (09/09 0500) Temp Source: Oral (09/09 0500) BP: 136/64 (09/09 0500) Pulse Rate: 88 (09/09 0500)  Labs: Recent Labs    09/30/17 0506 10/01/17 0443 10/02/17 0428  HGB 9.8*  --  9.8*  HCT 31.6*  --  30.4*  PLT 191  --  241  LABPROT 23.5* 20.8* 20.9*  INR 2.12 1.81 1.82  CREATININE 0.82  --  0.77    Estimated Creatinine Clearance: 42.5 mL/min (by C-G formula based on SCr of 0.77 mg/dL).   Assessment: Ellen Barajas s/p IM nail left hip. On Coumadin 5mg  daily exc 2.5mg  on MWF PTA for Afib + VTE px s/p IM nail left hip 9/5. INR down to 1.82 today. Hgb 9.8, plts wnl.  Goals of Therapy:  INR 2-3 Monitor platelets by anticoagulation protocol: Yes   Plan:  Give Coumadin 5mg  PO x 1 tonight if still here Monitor daily INR, CBC, s/s of bleed  May discharge to SNF today  Enzo Bi, PharmD, BCPS Clinical Pharmacist Phone number [phone] 10/02/2017 9:39 AM

## 2017-10-02 NOTE — Discharge Summary (Signed)
Discharge Summary  Ellen Barajas:[id number] DOB: 1928/02/06  PCP: Eustaquio Boyden, MD  Admit date: 09/27/2017 Discharge date: 10/02/2017  Time spent: , more than 50% time spent on coordination of care  Recommendations for Outpatient Follow-up:  1. F/u with SNF MD  for hospital discharge follow up, repeat cbc/bmp at follow up. SNF MD to monitor INR daily for the next three days, further coumadin dosing per SNF MD 2. F/u with orthopedic Dr Eulah Pont in two weeks 3. F/u with cardiology Dr Elease Hashimoto for afib/heart murmur  Discharge Diagnoses:  Active Hospital Problems   Diagnosis Date Noted  . Hip fracture (HCC) 09/27/2017  . Hx of CABG 12/17/2012  . IHD (ischemic heart disease)   . Persistent atrial fibrillation (HCC) 04/19/2010    Resolved Hospital Problems  No resolved problems to display.    Discharge Condition: stable  Diet recommendation: heart healthy  Filed Weights   09/27/17 2000  Weight: 67.5 kg    History of present illness: (per admitting MD Dr Irene Limbo) PCP: Eustaquio Boyden, MD   Chief Complaint: fall  HPI:  84 year old woman PMH atrial fibrillation, CABG, presented after a fall at the grocery store resulting in difficulty moving her left leg.  Imaging revealed left hip fracture.  Patient was in the parking lot putting away her groceries when she turned around and fell to the ground, immediately afterwards she had great difficulty moving her leg although she had no pain.  Despite rest her symptoms did not improve and eventually retired Engineer, civil (consulting) convinced her that she needed to go to the emergency department.  No specific aggravating or alleviating factors noted, again no pain noted.  She is a history of coronary artery disease but has had no recent chest pain or shortness of breath and is quite active.  ED Course: She was treated with Tylenol, morphine, Zofran.  Vitals were stable.  Hospital Course:  Active Problems:   Persistent atrial fibrillation  (HCC)   IHD (ischemic heart disease)   Hx of CABG   Hip fracture (HCC)   Left hip fx s/p mechanical fall -patient at baseline Very active, takes care of her husband at home, no difficulty walking, no exertional symptoms. RCRI 1. -IM nailing on 9/5 -She may WBAT on LLE per ortho _ortho oked to resume coumadin -start therapy, snf placement -f/u with ortho in two weeks  Chronic afib on coumadin: -rate controlled, continue home meds atenolol -INR on presentation is 1.58 -coumadin held in preparation for surgery, ortho oked to resume coumadin  -INR at discharge 1.8 ( goal of INR 2-3 per coumadin clinic) -resume home regimen (2.5 mg (5 mg x 0.5) every Mon, Wed, Fri; 5 mg (5 mg x 1) all other days) -SNF MD to check INR  HTN:  Home med cozaar held perioperatively, resumed at decreased dose  She was on cozaar 50mg  daily, she is discharged on cozaar 25mg  daily She is continued on her home meds atenolol Monitor blood pressure. Titrate bp meds as needed  CAD s/p CABG in 2002 -stable, no chest pain, no sob, no acute EKG changes -continue home meds  Impaired memory Not oriented to time at baseline  Transient Hypoxia? Patient does not appear symptomatic, she denies sob, no cough, cxr no acute findings, she is on anticoagulation with therapeutic INR  Monitor , ambulate,  Now off oxygen, no hypoxia on room air  Code Status: DNR , confirmed with POA  [name] at bedside  Family Communication: patient and family Disposition Plan: SNF  Consultants:  ortho  Procedures:  IM nailing to left hip on 9/5 by Dr Eulah Pont  Antibiotics:  perioperative   Discharge Exam: BP 136/64 (BP Location: Left Arm)   Pulse 88   Temp 98.3 F (36.8 C) (Oral)   Resp 15   Ht 5' (1.524 m)   Wt 67.5 kg   SpO2 97%   BMI 29.06 kg/m    General:  NAD, hard of hearing  Cardiovascular: IRRR, + murmur ( reports chronic)  Respiratory: CTABL  Abdomen: Soft/ND/NT, positive  BS  Musculoskeletal: No Edema  Neuro: alert, oriented to place and person, not to time, reports chronic    Discharge Instructions You were cared for by a hospitalist during your hospital stay. If you have any questions about your discharge medications or the care you received while you were in the hospital after you are discharged, you can call the unit and asked to speak with the hospitalist on call if the hospitalist that took care of you is not available. Once you are discharged, your primary care physician will handle any further medical issues. Please note that NO REFILLS for any discharge medications will be authorized once you are discharged, as it is imperative that you return to your primary care physician (or establish a relationship with a primary care physician if you do not have one) for your aftercare needs so that they can reassess your need for medications and monitor your lab values.  Discharge Instructions    Diet - low sodium heart healthy   Complete by:  As directed    Increase activity slowly   Complete by:  As directed    She may WBAT on LLE     Allergies as of 10/02/2017      Reactions   Crestor [rosuvastatin Calcium]    Lipitor [atorvastatin Calcium]    Niaspan [niacin Er]    Other    PER PATIENT SHE DOES NOT TOLERATE NARCOTICS   Pravastatin    Ramipril [ramipril]    Hyperkalemia   Welchol [colesevelam Hcl]    Zetia [ezetimibe]       Medication List    STOP taking these medications   aspirin 325 MG tablet     TAKE these medications   atenolol 25 MG tablet Commonly known as:  TENORMIN Take 1 tablet by mouth daily   fish oil-omega-3 fatty acids 1000 MG capsule Take 1 capsule by mouth daily.   HYDROcodone-acetaminophen 5-325 MG tablet Commonly known as:  NORCO/VICODIN Take 1-2 tablets by mouth every 4 (four) hours as needed for up to 7 days for moderate pain (pain score 4-6).   losartan 25 MG tablet Commonly known as:  COZAAR Take 1 tablet (25 mg  total) by mouth daily. What changed:    medication strength  how much to take   nitroGLYCERIN 0.4 MG SL tablet Commonly known as:  NITROSTAT Place 0.4 mg under the tongue every 5 (five) minutes as needed for chest pain (for chest pain).   polyethylene glycol packet Commonly known as:  MIRALAX / GLYCOLAX Take 17 g by mouth daily as needed for mild constipation.   RED YEAST RICE PO Take by mouth 2 (two) times daily.   warfarin 5 MG tablet Commonly known as:  COUMADIN Take as directed. If you are unsure how to take this medication, talk to your nurse or doctor. Original instructions:  Take as directed by anticoagulation clinic 2.5 mg (5 mg x 0.5) every Mon, Wed, Fri; 5 mg (5 mg  x 1) all other days What changed:  additional instructions   WOMENS MULTI VITAMIN & MINERAL Tabs Take 1 tablet by mouth daily.      Allergies  Allergen Reactions  . Crestor [Rosuvastatin Calcium]   . Lipitor [Atorvastatin Calcium]   . Niaspan [Niacin Er]   . Other     PER PATIENT SHE DOES NOT TOLERATE NARCOTICS  . Pravastatin   . Ramipril [Ramipril]     Hyperkalemia  . Welchol [Colesevelam Hcl]   . Zetia [Ezetimibe]    Follow-up Information    Sheral Apley, MD Follow up in 2 week(s).   Specialty:  Orthopedic Surgery Contact information: [address] [place] [address] [phone]        Eustaquio Boyden, MD Follow up in 1 week(s).   Specialty:  Family Medicine Why:  hospital discharge follow up Contact information: [address] [place] [address] [phone]        Nahser, Deloris Ping, MD Follow up.   Specialty:  Cardiology Why:  afib and heart murmur Contact information: [address] [place] [address] [phone]            The results of significant diagnostics from this hospitalization (including imaging, microbiology, ancillary and laboratory) are listed below for reference.    Significant Diagnostic  Studies: Dg Chest 1 View  Result Date: 09/27/2017 CLINICAL DATA:  Pt fell behind her car today and c/o left hip pain. No chest complaints. Hx of AFIB AND CAD. Pt is a nonsmoker. EXAM: CHEST  1 VIEW COMPARISON:  10/02/2009 FINDINGS: Cardiac silhouette is mildly enlarged. There stable changes from prior CABG surgery. No mediastinal or hilar masses. No convincing adenopathy. Lungs are hyperexpanded, but clear. No pleural effusion.  No pneumothorax. Skeletal structures are demineralized but grossly intact. IMPRESSION: No acute cardiopulmonary disease. Electronically Signed   By: Amie Portland M.D.   On: 09/27/2017 16:12   Pelvis Portable  Result Date: 09/28/2017 CLINICAL DATA:  Operative fixation of a left intertrochanteric fracture. EXAM: PORTABLE PELVIS 1-2 VIEWS COMPARISON:  Yesterday. FINDINGS: Interval intramedullary rod and compression screw fixation of the previously demonstrated left intertrochanteric fracture. Mild lateral and proximal displacement of the distal fragment. Mild valgus angulation. Diffuse osteopenia. IMPRESSION: Hardware fixation of the previously demonstrated left intertrochanteric fracture. Electronically Signed   By: Beckie Salts M.D.   On: 09/28/2017 11:54   Dg C-arm 1-60 Min  Result Date: 09/28/2017 CLINICAL DATA:  LEFT femoral nailing of an intertrochanteric fracture of the LEFT femur EXAM: LEFT FEMUR 2 VIEWS; DG C-ARM 61-120 MIN COMPARISON:  09/27/2017 FLUOROSCOPY TIME:  0 minutes 32 seconds Images obtained: 5 Dose: 4.75 mGy FINDINGS: Images demonstrate diffuse osseous demineralization. Hip joint space preserved. Sequential placement of an IM nail and compression screw at the proximal LEFT femur post ORIF of a reduced intertrochanteric fracture. No dislocation or additional fracture identified. IMPRESSION: Post ORIF LEFT femur. Electronically Signed   By: Ulyses Southward M.D.   On: 09/28/2017 11:55   Dg Hip Unilat W Or Wo Pelvis 2-3 Views Left  Result Date: 09/27/2017 CLINICAL DATA:   Status post fall today. Left hip pain. Initial encounter. EXAM: DG HIP (WITH OR WITHOUT PELVIS) 2-3V LEFT COMPARISON:  None. FINDINGS: The patient has an acute left intertrochanteric fracture. No other acute abnormality is identified. Bones are osteopenic. IMPRESSION: Acute left intertrochanteric fracture. Osteopenia. Electronically Signed   By: Drusilla Kanner M.D.   On: 09/27/2017 16:00   Dg  Femur Min 2 Views Left  Result Date: 09/28/2017 CLINICAL DATA:  LEFT femoral nailing of an intertrochanteric fracture of the LEFT femur EXAM: LEFT FEMUR 2 VIEWS; DG C-ARM 61-120 MIN COMPARISON:  09/27/2017 FLUOROSCOPY TIME:  0 minutes 32 seconds Images obtained: 5 Dose: 4.75 mGy FINDINGS: Images demonstrate diffuse osseous demineralization. Hip joint space preserved. Sequential placement of an IM nail and compression screw at the proximal LEFT femur post ORIF of a reduced intertrochanteric fracture. No dislocation or additional fracture identified. IMPRESSION: Post ORIF LEFT femur. Electronically Signed   By: Ulyses Southward M.D.   On: 09/28/2017 11:55    Microbiology: Recent Results (from the past 240 hour(s))  MRSA PCR Screening     Status: None   Collection Time: 09/27/17  6:46 PM  Result Value Ref Range Status   MRSA by PCR NEGATIVE NEGATIVE Final    Comment:        The GeneXpert MRSA Assay (FDA approved for NASAL specimens only), is one component of a comprehensive MRSA colonization surveillance program. It is not intended to diagnose MRSA infection nor to guide or monitor treatment for MRSA infections. Performed at [facility], [address] [place], [place] [address]   Surgical pcr screen     Status: None   Collection Time: 09/28/17  1:16 AM  Result Value Ref Range Status   MRSA, PCR NEGATIVE NEGATIVE Final   Staphylococcus aureus NEGATIVE NEGATIVE Final    Comment: (NOTE) The Xpert SA Assay (FDA approved for NASAL specimens in patients 70 years of age and older), is one component  of a comprehensive surveillance program. It is not intended to diagnose infection nor to guide or monitor treatment. Performed at [facility], [address], [place], [place] [address]      Labs: Basic Metabolic Panel: Recent Labs  Lab 09/27/17 1522 09/29/17 0438 09/30/17 0506 10/02/17 0428  NA 142 140 140 140  K 4.3 4.1 4.6 4.3  CL 108 104 103 106  CO2 23 28 27 26   GLUCOSE 93 136* 110* 102*  BUN 17 17 17 20   CREATININE 0.83 0.88 0.82 0.77  CALCIUM 9.2 8.4* 8.6* 8.6*  MG  --  2.1  --  2.3   Liver Function Tests: No results for input(s): AST, ALT, ALKPHOS, BILITOT, PROT, ALBUMIN in the last 168 hours. No results for input(s): LIPASE, AMYLASE in the last 168 hours. No results for input(s): AMMONIA in the last 168 hours. CBC: Recent Labs  Lab 09/27/17 1522 09/28/17 0414 09/29/17 0438 09/30/17 0506 10/02/17 0428  WBC 6.9 8.0 7.6 7.7 6.9  NEUTROABS 5.1  --   --  5.4  --   HGB 12.9 11.5* 10.2* 9.8* 9.8*  HCT 41.2 36.4 32.5* 31.6* 30.4*  MCV 95.8 94.1 95.0 95.8 93.3  PLT 218 197 189 191 241   Cardiac Enzymes: No results for input(s): CKTOTAL, CKMB, CKMBINDEX, TROPONINI in the last 168 hours. BNP: BNP (last 3 results) No results for input(s): BNP in the last 8760 hours.  ProBNP (last 3 results) No results for input(s): PROBNP in the last 8760 hours.  CBG: No results for input(s): GLUCAP in the last 168 hours.     Signed:  Albertine Grates MD, PhD  [facility] 10/02/2017, 11:46 AM

## 2017-10-03 LAB — PROTIME-INR
INR: 1.85
PROTHROMBIN TIME: 21.2 s — AB (ref 11.4–15.2)

## 2017-10-03 MED ORDER — ATENOLOL 50 MG PO TABS
25.0000 mg | ORAL_TABLET | Freq: Every day | ORAL | Status: DC
  Administered 2017-10-03: 25 mg via ORAL
  Filled 2017-10-03: qty 1

## 2017-10-03 MED ORDER — WARFARIN SODIUM 6 MG PO TABS
6.0000 mg | ORAL_TABLET | Freq: Once | ORAL | Status: DC
  Filled 2017-10-03: qty 1

## 2017-10-03 NOTE — Clinical Social Work Placement (Signed)
   CLINICAL SOCIAL WORK PLACEMENT  NOTE  Date:  10/03/2017  Patient Details  Name: Ellen Barajas MRN: [id number] Date of Birth: 1928/05/29  Clinical Social Work is seeking post-discharge placement for this patient at the Skilled  Nursing Facility level of care (*CSW will initial, date and re-position this form in  chart as items are completed):      Patient/family provided with [facility] Clinical Social Work Department's list of facilities offering this level of care within the geographic area requested by the patient (or if unable, by the patient's family).  Yes   Patient/family informed of their freedom to choose among providers that offer the needed level of care, that participate in Medicare, Medicaid or managed care program needed by the patient, have an available bed and are willing to accept the patient.      Patient/family informed of [facility]'s ownership interest in [facility] and [facility], as well as of the fact that they are under no obligation to receive care at these facilities.  PASRR submitted to EDS on       PASRR number received on 10/01/17     Existing PASRR number confirmed on       FL2 transmitted to all facilities in geographic area requested by pt/family on 10/01/17     FL2 transmitted to all facilities within larger geographic area on       Patient informed that his/her managed care company has contracts with or will negotiate with certain facilities, including the following:        Yes   Patient/family informed of bed offers received.  Patient chooses bed at [facility]     Physician recommends and patient chooses bed at      Patient to be transferred to [facility] on 10/03/17.  Patient to be transferred to facility by [facility]     Patient family notified on 10/03/17 of transfer.  Name of family member notified:  [name]-daughter     PHYSICIAN       Additional Comment:    _______________________________________________ Eduard Roux, LCSWA 10/03/2017, 2:25 PM

## 2017-10-03 NOTE — Progress Notes (Signed)
ANTICOAGULATION CONSULT NOTE  Pharmacy Consult:  Coumadin Indication:  Afib  Allergies  Allergen Reactions  . Crestor [Rosuvastatin Calcium]   . Lipitor [Atorvastatin Calcium]   . Niaspan [Niacin Er]   . Other     PER PATIENT SHE DOES NOT TOLERATE NARCOTICS  . Pravastatin   . Ramipril [Ramipril]     Hyperkalemia  . Welchol [Colesevelam Hcl]   . Zetia [Ezetimibe]     Patient Measurements: Height: 5' (152.4 cm) Weight: 148 lb 13 oz (67.5 kg) IBW/kg (Calculated) : 45.5  Vital Signs: Temp: 97.7 F (36.5 C) (09/10 0801) Temp Source: Oral (09/10 0801) BP: 136/86 (09/10 0801) Pulse Rate: 97 (09/10 0801)  Labs: Recent Labs    10/01/17 0443 10/02/17 0428 10/03/17 0335  HGB  --  9.8*  --   HCT  --  30.4*  --   PLT  --  241  --   LABPROT 20.8* 20.9* 21.2*  INR 1.81 1.82 1.85  CREATININE  --  0.77  --     Estimated Creatinine Clearance: 42.5 mL/min (by C-G formula based on SCr of 0.77 mg/dL).  Assessment: 67 yoF s/p IM nail left hip. On Coumadin 5mg  daily except 2.5mg  on MWF PTA for Afib + VTE px s/p IM nail left hip 9/5. INR remains subtherapeutic at 1.85. Hgb 9.8, plts wnl.  Goals of Therapy:  INR 2-3 Monitor platelets by anticoagulation protocol: Yes   Plan:  Give Coumadin 6mg  PO x 1 tonight if still here Monitor daily INR, CBC, s/s of bleed  May discharge to SNF today  Christoper Fabian, PharmD, BCPS Clinical pharmacist  **Pharmacist phone directory can now be found on [email or web address] ([email or web address]).  Listed under [facility]. 10/03/2017 8:07 AM

## 2017-10-03 NOTE — Progress Notes (Signed)
Patent's condition is stable, unchanged. No addendum needed to discharge summary done on 9/9 except discharge date changed to 9/10.

## 2017-10-03 NOTE — Progress Notes (Signed)
Patient has been discharged. Report given to Glendell Docker, LPN at [facility]. Left hospital by [facility]. Beverely Low, RN 10/03/2017 3:22 PM

## 2017-10-03 NOTE — Progress Notes (Signed)
Pt will DC [facility]  DC date:10/03/2017 Family notified: [name]- daughter Transport by: [facility]  RN, patient, and facility notified of DC. Discharge Summary sent to facility. RN given number for report. DC packet on chart [phone] . Ambulance transport requested for patient.   CSW signing off. Antony Blackbird, [facility] Clinical Social Worker [phone]

## 2017-10-03 NOTE — Progress Notes (Signed)
Patient woke up very confused and anxious. Pt could not remember what happened to her or where she was. RN had to keep reorienting pt of her situation and where she was or what day it is. Will continue to monitor patient.

## 2017-10-18 DIAGNOSIS — S72142D Displaced intertrochanteric fracture of left femur, subsequent encounter for closed fracture with routine healing: Secondary | ICD-10-CM | POA: Diagnosis not present

## 2017-10-19 ENCOUNTER — Encounter (INDEPENDENT_AMBULATORY_CARE_PROVIDER_SITE_OTHER): Payer: Medicare Other | Admitting: Ophthalmology

## 2017-10-19 DIAGNOSIS — H353231 Exudative age-related macular degeneration, bilateral, with active choroidal neovascularization: Secondary | ICD-10-CM

## 2017-10-19 DIAGNOSIS — H35033 Hypertensive retinopathy, bilateral: Secondary | ICD-10-CM | POA: Diagnosis not present

## 2017-10-19 DIAGNOSIS — I10 Essential (primary) hypertension: Secondary | ICD-10-CM

## 2017-10-19 DIAGNOSIS — H43813 Vitreous degeneration, bilateral: Secondary | ICD-10-CM

## 2017-10-23 ENCOUNTER — Ambulatory Visit: Payer: Medicare Other | Admitting: Pharmacist

## 2017-10-23 ENCOUNTER — Telehealth: Payer: Self-pay | Admitting: Family Medicine

## 2017-10-23 DIAGNOSIS — Z0279 Encounter for issue of other medical certificate: Secondary | ICD-10-CM

## 2017-10-23 DIAGNOSIS — Z5181 Encounter for therapeutic drug level monitoring: Secondary | ICD-10-CM | POA: Diagnosis not present

## 2017-10-23 DIAGNOSIS — I481 Persistent atrial fibrillation: Secondary | ICD-10-CM

## 2017-10-23 DIAGNOSIS — I4819 Other persistent atrial fibrillation: Secondary | ICD-10-CM

## 2017-10-23 LAB — POCT INR: INR: 3.5 — AB (ref 2.0–3.0)

## 2017-10-23 NOTE — Telephone Encounter (Signed)
Filled and in Lisa's box 

## 2017-10-23 NOTE — Patient Instructions (Signed)
Description   Skip your Coumadin, then continue taking 1 tablet every day except 1/2 tablet on Mondays, Wednesdays, and Fridays. Recheck INR in 2 weeks. Call us with any medication changes or concerns # [phone].

## 2017-10-23 NOTE — Telephone Encounter (Signed)
Form is in your box

## 2017-10-23 NOTE — Telephone Encounter (Signed)
Patient's POA [name]) came in to have a handicap parking permit form filled out by Dr. Reece Agar. Advised we would call when ready for pick up. She would like to get it sometime next week if possible. Placed in RX tower.

## 2017-10-24 NOTE — Telephone Encounter (Signed)
Spoke with pt's niece, [name] (on dpr), informing her pt's form is ready to pick up. States she will pick it up next week.   [Placed form at front office. Made copy to be scanned and 1 for billing.]

## 2017-10-31 ENCOUNTER — Other Ambulatory Visit: Payer: Self-pay | Admitting: Pharmacist

## 2017-10-31 ENCOUNTER — Other Ambulatory Visit: Payer: Self-pay | Admitting: Cardiovascular Disease

## 2017-10-31 MED ORDER — ATENOLOL 25 MG PO TABS: ORAL_TABLET | ORAL | 0 refills | Status: DC

## 2017-10-31 MED ORDER — ATENOLOL 25 MG PO TABS: ORAL_TABLET | ORAL | 2 refills | Status: DC

## 2017-10-31 NOTE — Telephone Encounter (Signed)
Pt's medication was sent to pt's pharmacy, [facility] for a 10 day supply and also sent to mail order pharmacy for additional refills, as requested. Confirmation received

## 2017-10-31 NOTE — Telephone Encounter (Signed)
Pt called Coumadin clinic asking for refill of atenolol. Advised pt I would send message to refill dept. She has refill for mail order pharmacy but will run out a few days before shipment arrives and needs 1 week of medication sent to local [facility].

## 2017-11-07 ENCOUNTER — Other Ambulatory Visit: Payer: Self-pay | Admitting: Cardiovascular Disease

## 2017-11-07 ENCOUNTER — Ambulatory Visit (INDEPENDENT_AMBULATORY_CARE_PROVIDER_SITE_OTHER): Payer: Medicare Other

## 2017-11-07 DIAGNOSIS — I4819 Other persistent atrial fibrillation: Secondary | ICD-10-CM

## 2017-11-07 DIAGNOSIS — Z5181 Encounter for therapeutic drug level monitoring: Secondary | ICD-10-CM | POA: Diagnosis not present

## 2017-11-07 LAB — POCT INR: INR: 3.4 — AB (ref 2.0–3.0)

## 2017-11-07 NOTE — Patient Instructions (Signed)
Description   Skip today's dosage of Coumadin, then start taking 1 tablet every day except 1/2 tablet on Mondays, Wednesdays, Fridays, and Saturdays. Recheck INR in 2 weeks. Call us with any medication changes or concerns # [phone].

## 2017-11-15 DIAGNOSIS — S72142D Displaced intertrochanteric fracture of left femur, subsequent encounter for closed fracture with routine healing: Secondary | ICD-10-CM | POA: Diagnosis not present

## 2017-11-21 ENCOUNTER — Ambulatory Visit (INDEPENDENT_AMBULATORY_CARE_PROVIDER_SITE_OTHER): Payer: Medicare Other | Admitting: *Deleted

## 2017-11-21 ENCOUNTER — Other Ambulatory Visit: Payer: Self-pay | Admitting: Cardiovascular Disease

## 2017-11-21 DIAGNOSIS — Z5181 Encounter for therapeutic drug level monitoring: Secondary | ICD-10-CM | POA: Diagnosis not present

## 2017-11-21 DIAGNOSIS — I4819 Other persistent atrial fibrillation: Secondary | ICD-10-CM | POA: Diagnosis not present

## 2017-11-21 LAB — POCT INR: INR: 1.7 — AB (ref 2.0–3.0)

## 2017-11-21 NOTE — Patient Instructions (Signed)
Description   Today take 1.5 tablets, then continue taking 1/2 tablet daily except 1 tablet on Sundays, Tuesdays and Thursdays. Recheck INR in 2 weeks. Call us with any medication changes or concerns # [phone].

## 2017-11-22 DIAGNOSIS — H524 Presbyopia: Secondary | ICD-10-CM | POA: Diagnosis not present

## 2017-12-01 ENCOUNTER — Ambulatory Visit (INDEPENDENT_AMBULATORY_CARE_PROVIDER_SITE_OTHER): Payer: Medicare Other | Admitting: Pharmacist

## 2017-12-01 DIAGNOSIS — I4819 Other persistent atrial fibrillation: Secondary | ICD-10-CM | POA: Diagnosis not present

## 2017-12-01 DIAGNOSIS — Z5181 Encounter for therapeutic drug level monitoring: Secondary | ICD-10-CM | POA: Diagnosis not present

## 2017-12-01 LAB — POCT INR: INR: 2.3 (ref 2.0–3.0)

## 2017-12-01 NOTE — Patient Instructions (Signed)
Description   Continue taking 1/2 tablet daily except 1 tablet on Sundays, Tuesdays and Thursdays. Recheck INR in 3 weeks. Call us with any medication changes or concerns # [phone].

## 2017-12-14 ENCOUNTER — Encounter (INDEPENDENT_AMBULATORY_CARE_PROVIDER_SITE_OTHER): Payer: Medicare Other | Admitting: Ophthalmology

## 2017-12-14 DIAGNOSIS — H35033 Hypertensive retinopathy, bilateral: Secondary | ICD-10-CM | POA: Diagnosis not present

## 2017-12-14 DIAGNOSIS — H4313 Vitreous hemorrhage, bilateral: Secondary | ICD-10-CM | POA: Diagnosis not present

## 2017-12-14 DIAGNOSIS — H353231 Exudative age-related macular degeneration, bilateral, with active choroidal neovascularization: Secondary | ICD-10-CM | POA: Diagnosis not present

## 2017-12-14 DIAGNOSIS — I10 Essential (primary) hypertension: Secondary | ICD-10-CM | POA: Diagnosis not present

## 2017-12-27 ENCOUNTER — Ambulatory Visit: Payer: Medicare Other | Admitting: Pharmacist

## 2017-12-27 DIAGNOSIS — Z5181 Encounter for therapeutic drug level monitoring: Secondary | ICD-10-CM

## 2017-12-27 DIAGNOSIS — I4819 Other persistent atrial fibrillation: Secondary | ICD-10-CM | POA: Diagnosis not present

## 2017-12-27 LAB — POCT INR: INR: 2.4 (ref 2.0–3.0)

## 2017-12-27 NOTE — Patient Instructions (Signed)
Description   Continue taking 1/2 tablet daily except 1 tablet on Sundays, Tuesdays and Thursdays. Recheck INR in 4 weeks. Call us with any medication changes or concerns # [phone].

## 2018-01-05 ENCOUNTER — Telehealth: Payer: Self-pay | Admitting: Cardiovascular Disease

## 2018-01-05 NOTE — Telephone Encounter (Signed)
  Patient's daughter would like to discuss a diagnoses that was given to Ms Ellen [facility] at [facility].

## 2018-01-05 NOTE — Telephone Encounter (Signed)
This encounter was created in error - please disregard.

## 2018-01-05 NOTE — Addendum Note (Signed)
Addended by: Scherrie BatemanYORK, Purl Claytor E on: 01/05/2018 12:15 PM   Modules accepted: Level of Service, SmartSet

## 2018-01-08 ENCOUNTER — Other Ambulatory Visit: Payer: Self-pay | Admitting: Cardiovascular Disease

## 2018-01-26 ENCOUNTER — Ambulatory Visit: Payer: Medicare Other

## 2018-01-26 ENCOUNTER — Encounter (INDEPENDENT_AMBULATORY_CARE_PROVIDER_SITE_OTHER): Payer: Self-pay

## 2018-01-26 DIAGNOSIS — I4819 Other persistent atrial fibrillation: Secondary | ICD-10-CM | POA: Diagnosis not present

## 2018-01-26 DIAGNOSIS — Z5181 Encounter for therapeutic drug level monitoring: Secondary | ICD-10-CM | POA: Diagnosis not present

## 2018-01-26 LAB — POCT INR: INR: 2.4 (ref 2.0–3.0)

## 2018-01-26 MED ORDER — WARFARIN SODIUM 5 MG PO TABS: ORAL_TABLET | ORAL | 1 refills | Status: DC

## 2018-01-26 NOTE — Patient Instructions (Signed)
Description   Continue taking 1/2 tablet daily except 1 tablet on Sundays, Tuesdays and Thursdays. Recheck INR in 6 weeks. Call us with any medication changes or concerns # [phone].    

## 2018-02-06 ENCOUNTER — Encounter: Payer: Self-pay | Admitting: Cardiovascular Disease

## 2018-02-06 ENCOUNTER — Ambulatory Visit: Payer: Medicare Other | Admitting: Cardiovascular Disease

## 2018-02-06 VITALS — BP 126/62 | HR 88 | Ht 60.0 in | Wt 144.2 lb

## 2018-02-06 DIAGNOSIS — I2510 Atherosclerotic heart disease of native coronary artery without angina pectoris: Secondary | ICD-10-CM | POA: Diagnosis not present

## 2018-02-06 DIAGNOSIS — I482 Chronic atrial fibrillation, unspecified: Secondary | ICD-10-CM | POA: Diagnosis not present

## 2018-02-06 LAB — HEPATIC FUNCTION PANEL
ALBUMIN: 4.2 g/dL (ref 3.5–4.7)
ALT: 18 IU/L (ref 0–32)
AST: 15 IU/L (ref 0–40)
Alkaline Phosphatase: 88 IU/L (ref 39–117)
Bilirubin Total: 0.5 mg/dL (ref 0.0–1.2)
Bilirubin, Direct: 0.16 mg/dL (ref 0.00–0.40)
Total Protein: 6.1 g/dL (ref 6.0–8.5)

## 2018-02-06 LAB — BASIC METABOLIC PANEL
BUN / CREAT RATIO: 18 (ref 12–28)
BUN: 15 mg/dL (ref 8–27)
CO2: 25 mmol/L (ref 20–29)
Calcium: 9.5 mg/dL (ref 8.7–10.3)
Chloride: 103 mmol/L (ref 96–106)
Creatinine, Ser: 0.84 mg/dL (ref 0.57–1.00)
GFR calc Af Amer: 72 mL/min/{1.73_m2} (ref >59)
GFR, EST NON AFRICAN AMERICAN: 63 mL/min/{1.73_m2} (ref >59)
GLUCOSE: 88 mg/dL (ref 65–99)
POTASSIUM: 4.4 mmol/L (ref 3.5–5.2)
SODIUM: 142 mmol/L (ref 134–144)

## 2018-02-06 LAB — LIPID PANEL
CHOLESTEROL TOTAL: 211 mg/dL — AB (ref 100–199)
Chol/HDL Ratio: 3.3 ratio (ref 0.0–4.4)
HDL: 63 mg/dL (ref >39)
LDL CALC: 124 mg/dL — AB (ref 0–99)
Triglycerides: 118 mg/dL (ref 0–149)
VLDL CHOLESTEROL CAL: 24 mg/dL (ref 5–40)

## 2018-02-06 NOTE — Patient Instructions (Signed)
Medication Instructions:  Your physician recommends that you continue on your current medications as directed. Please refer to the Current Medication list given to you today.  If you need a refill on your cardiac medications before your next appointment, please call your pharmacy.    Lab work: TODAY - cholesterol, liver panel, basic metabolic panel  If you have labs (blood work) drawn today and your tests are completely normal, you will receive your results only by: . MyChart Message (if you have MyChart) OR . A paper copy in the mail If you have any lab test that is abnormal or we need to change your treatment, we will call you to review the results.   Testing/Procedures: None Ordered   Follow-Up: At [facility], you and your health needs are our priority.  As part of our continuing mission to provide you with exceptional heart care, we have created designated Provider Care Teams.  These Care Teams include your primary Cardiologist (physician) and Advanced Practice Providers (APPs -  Physician Assistants and Nurse Practitioners) who all work together to provide you with the care you need, when you need it. You will need a follow up appointment in:  6 months.  Please call our office 2 months in advance to schedule this appointment.  You may see Philip Nahser, MD or one of the following Advanced Practice Providers on your designated Care Team: Scott Weaver, PA-C Vin Bhagat, PA-C . Janine Hammond, NP   

## 2018-02-06 NOTE — Progress Notes (Signed)
Cardiology Office Note   Date:  02/06/2018   ID:  MARAIAH BERWICK, DOB August 08, 1928, MRN [id number]  PCP:  Eustaquio Boyden, MD  Cardiologist: Cassell Clement MD --->  Taquisha Phung   Chief Complaint  Patient presents with  . Coronary Artery Disease   Problem list 1. Coronary artery disease-status post coronary artery bypass grafting 2. Atrial fibrillation - persistent  3. Hyperlipidemia    Previous notes from [place] is a 85 y.o. female who presents for scheduled follow-up visit  This pleasant 85 year old woman is seen for a scheduled four-month followup office visit. She has a history of ischemic heart disease. She has had previous CABG. [name] been in chronic atrial fibrillation. She underwent cardioversion on 10/23/09 but 1 week later was back in atrial fibrillation. We have made no further attempts at cardioversion. She is essentially asymptomatic with her rhythm. She has a history of hypercholesterolemia and is on over-the-counter preparations. We are checking lipids today. Since last visit she has had no new cardiac symptoms. She remains on long-term Coumadin. Her post herpetic neuralgia pain is improved. She has osteoarthritis of the knees. She does not take anything for it. I suggested that she try some generic Tylenol Since her last visit she has not been experiencing any TIA or stroke symptoms.  No increased exertional dyspnea.  No chest pain.  Nov. 13, 2017:  Ceniya is seen today for initial visit .   Seen with husband, Leonette Most.   [name]'s older sister.  No CP or dyspnea . Is in chronic atrial fib.   She is completely asymptomatic.     Jun 13, 2016:    Seen back today for follow up HTN BP is elevated.   December 13, 2016: Ms. Joiner is seen back today for follow-up visit of her coronary artery disease, hypertension, atrial fibrillation. No CP or dyspnea Having some eye issues.   Getting laser surgery   July 26, 2017:  Adrielle is  seen today for a follow-up  visit.  Has a history of coronary artery disease and is status post coronary artery bypass grafting.  History of atrial fibrillation and is on warfarin. She is taking care of her husband at [facility].   Plans on going home when she can  Doing great.    February 06, 2018: Seen with Paula Compton, Niece. She is Mikel Cella 's sister .  This is seen today for follow-up of her coronary artery disease and coronary artery bypass grafting.  She also has permanent atrial fibrillation.  She fell and broke her hip this past September.  She had surgical repair and seems to be getting along quite well.  Her husband has passed away since I last saw Doing well from a cardiac standpoint.    No CP or dyspena  She wants to change to a DOAC instead of warfarin     Last Weight  Most recent update: 02/06/2018 10:02 AM   Weight  65.4 kg (144 lb 3.2 oz)              Past Medical History:  Diagnosis Date  . AF (atrial fibrillation) (HCC)    coumadin  . Arthritis    "hands" (09/27/2017)  . Coronary artery disease 2002   s/p CABG  . Diverticulosis   . Dyslipidemia   . History of chicken pox   . IHD (ischemic heart disease)   . Myocardial infarction (HCC) ~ 2002  . Post herpetic neuralgia 02/2012  Past Surgical History:  Procedure Laterality Date  . BREAST LUMPECTOMY Left 1992   excision of fibrous tumor/notes 06/19/2000  . CARDIAC CATHETERIZATION  06/08/2000  . CATARACT EXTRACTION W/ INTRAOCULAR LENS  IMPLANT, BILATERAL Bilateral 2013  . CORONARY ARTERY BYPASS GRAFT  2002   x 4  . EYE SURGERY Bilateral 1989  . FEMUR IM NAIL Left 09/28/2017   Procedure: INTRAMEDULLARY (IM) NAIL FEMORAL;  Surgeon: Sheral ApleyMurphy, Timothy D, MD;  Location: [facility] OR;  Service: Orthopedics;  Laterality: Left;     Current Outpatient Medications  Medication Sig Dispense Refill  . atenolol (TENORMIN) 25 MG tablet TAKE 1 TABLET BY MOUTH ONCE DAILY 90 tablet 2  . losartan (COZAAR) 25 MG tablet Take 1 tablet (25 mg total) by mouth  daily. 30 tablet 0  . Red Yeast Rice Extract (RED YEAST RICE PO) Take by mouth 2 (two) times daily.       No current facility-administered medications for this visit.     Allergies:   Crestor [rosuvastatin calcium]; Lipitor [atorvastatin calcium]; Niaspan [niacin er]; Other; Pravastatin; Ramipril [ramipril]; Welchol [colesevelam hcl]; and Zetia [ezetimibe]    Social History:  The patient  reports that she has never smoked. She has never used smokeless tobacco. She reports that she does not drink alcohol or use drugs.   Family History:  The patient's family history includes CAD (age of onset: 5074) in her father; CAD (age of onset: 5990) in her mother.    ROS:  Please see the history of present illness.   Otherwise, review of systems are positive for none.   All other systems are reviewed and negative.   Physical Exam: Blood pressure 126/62, pulse 88, height 5' (1.524 m), weight 144 lb 3.2 oz (65.4 kg), SpO2 97 %.  GEN:  Well nourished, well developed in no acute distress HEENT: Normal NECK: No JVD; No carotid bruits LYMPHATICS: No lymphadenopathy CARDIAC: Irregularly irregular RESPIRATORY:  Clear to auscultation without rales, wheezing or rhonchi  ABDOMEN: Soft, non-tender, non-distended MUSCULOSKELETAL:  No edema; No deformity  SKIN: Warm and dry NEUROLOGIC:  Alert and oriented x 3   EKG: .  Recent Labs: 10/02/2017: BUN 20; Creatinine, Ser 0.77; Hemoglobin 9.8; Magnesium 2.3; Platelets 241; Potassium 4.3; Sodium 140    Lipid Panel    Component Value Date/Time   CHOL 174 09/01/2016 1102   TRIG 64.0 09/01/2016 1102   HDL 50.20 09/01/2016 1102   CHOLHDL 3 09/01/2016 1102   VLDL 12.8 09/01/2016 1102   LDLCALC 111 (H) 09/01/2016 1102   LDLDIRECT 147.5 12/17/2012 0920      Wt Readings from Last 3 Encounters:  02/06/18 144 lb 3.2 oz (65.4 kg)  09/27/17 148 lb 13 oz (67.5 kg)  07/26/17 148 lb 12.8 oz (67.5 kg)        ASSESSMENT AND PLAN:  1. Ischemic heart disease  status post CABG -  .  She is doing well.  Not having any episodes of angina.   2. permanent atrial fibrillation-     would like to change from warfarin to 1 of the newer agents.  Her niece will check with the insurance company to see which is on her plan.  She would need Eliquis 5 mg twice a day or Xarelto 20 mg a day.  3. Hypercholesterolemia-     check lipids, liver enzymes, basic metabolic profile today.   4.  HTN:     Blood pressure is well controlled.  Continue current medications.   Kristeen MissPhilip Braedin Millhouse, MD  02/06/2018 10:16 AM    [facility] [address],  [address] [place], [place]  [address] Pager [phone] Phone: [phone]; Fax: [phone]

## 2018-02-07 ENCOUNTER — Other Ambulatory Visit: Payer: Self-pay | Admitting: Nurse Practitioner

## 2018-02-07 ENCOUNTER — Telehealth: Payer: Self-pay | Admitting: Nurse Practitioner

## 2018-02-07 NOTE — Telephone Encounter (Signed)
-----   Message from Vesta Mixer, MD sent at 02/06/2018  5:46 PM EST ----- She is generally intolerant to statins.  Continue diet, exercise.

## 2018-02-07 NOTE — Telephone Encounter (Signed)
Reviewed lab results with patient who verbalized understanding. She agrees that she is intolerant of statins and states she eats a healthy diet and is active in her garden. She states her medication list from her office visit yesterday is not correct. I reviewed patient's medications with her and removed the red yeast rice from her list and added back warfarin which was omitted. Patient thanked me for the call.

## 2018-02-14 ENCOUNTER — Encounter (INDEPENDENT_AMBULATORY_CARE_PROVIDER_SITE_OTHER): Payer: Medicare Other | Admitting: Ophthalmology

## 2018-02-14 DIAGNOSIS — H353231 Exudative age-related macular degeneration, bilateral, with active choroidal neovascularization: Secondary | ICD-10-CM | POA: Diagnosis not present

## 2018-02-14 DIAGNOSIS — H43813 Vitreous degeneration, bilateral: Secondary | ICD-10-CM | POA: Diagnosis not present

## 2018-03-09 ENCOUNTER — Ambulatory Visit (INDEPENDENT_AMBULATORY_CARE_PROVIDER_SITE_OTHER): Payer: Medicare Other | Admitting: Pharmacist

## 2018-03-09 ENCOUNTER — Encounter (INDEPENDENT_AMBULATORY_CARE_PROVIDER_SITE_OTHER): Payer: Self-pay

## 2018-03-09 DIAGNOSIS — I4819 Other persistent atrial fibrillation: Secondary | ICD-10-CM | POA: Diagnosis not present

## 2018-03-09 DIAGNOSIS — Z5181 Encounter for therapeutic drug level monitoring: Secondary | ICD-10-CM | POA: Diagnosis not present

## 2018-03-09 LAB — POCT INR: INR: 2.3 (ref 2.0–3.0)

## 2018-03-09 NOTE — Patient Instructions (Signed)
Description   Continue taking 1/2 tablet daily except 1 tablet on Sundays, Tuesdays and Thursdays. Recheck INR in 6 weeks. Call us with any medication changes or concerns # [phone].

## 2018-03-20 ENCOUNTER — Other Ambulatory Visit: Payer: Self-pay | Admitting: Cardiovascular Disease

## 2018-03-20 MED ORDER — LOSARTAN POTASSIUM 25 MG PO TABS: 25.0000 mg | ORAL_TABLET | Freq: Every day | ORAL | 3 refills | Status: DC

## 2018-03-27 ENCOUNTER — Encounter (INDEPENDENT_AMBULATORY_CARE_PROVIDER_SITE_OTHER): Payer: Medicare Other | Admitting: Ophthalmology

## 2018-03-27 DIAGNOSIS — H43813 Vitreous degeneration, bilateral: Secondary | ICD-10-CM

## 2018-03-27 DIAGNOSIS — H353231 Exudative age-related macular degeneration, bilateral, with active choroidal neovascularization: Secondary | ICD-10-CM

## 2018-04-17 ENCOUNTER — Telehealth: Payer: Self-pay

## 2018-04-17 NOTE — Telephone Encounter (Signed)

## 2018-04-19 ENCOUNTER — Other Ambulatory Visit: Payer: Self-pay

## 2018-04-19 ENCOUNTER — Ambulatory Visit (INDEPENDENT_AMBULATORY_CARE_PROVIDER_SITE_OTHER): Payer: Medicare Other | Admitting: Pharmacist

## 2018-04-19 DIAGNOSIS — I4819 Other persistent atrial fibrillation: Secondary | ICD-10-CM

## 2018-04-19 DIAGNOSIS — Z5181 Encounter for therapeutic drug level monitoring: Secondary | ICD-10-CM

## 2018-04-19 LAB — POCT INR: INR: 2.4 (ref 2.0–3.0)

## 2018-04-19 MED ORDER — RIVAROXABAN 15 MG PO TABS: 15.0000 mg | ORAL_TABLET | Freq: Every day | ORAL | 5 refills | Status: DC

## 2018-04-19 NOTE — Progress Notes (Signed)
Pt was started on Xarelto for afib on 04/19/18  Reviewed patients medication list.  Pt is not currently on any combined P-gp and strong CYP3A4 inhibitors/inducers (ketoconazole, traconazole, ritonavir, carbamazepine, phenytoin, rifampin, St. John's wort).  Reviewed labs.  SCr 0.86, CrCl- 49 mL/min.  Dose appropriate based on CrCl.   Hgb and HCT Within Normal Limits  A full discussion of the nature of anticoagulants has been carried out.  A benefit/risk analysis has been presented to the patient, so that they understand the justification for choosing anticoagulation with Xarelto at this time.  The need for compliance is stressed.  Pt is aware to take the medication once daily with the largest meal of the day.  Side effects of potential bleeding are discussed, including unusual colored urine or stools, coughing up blood or coffee ground emesis, nose bleeds or serious fall or head trauma.  Discussed signs and symptoms of stroke. The patient should avoid any OTC items containing aspirin or ibuprofen.  Avoid alcohol consumption.   Call if any signs of abnormal bleeding.  Discussed financial obligations and resolved any difficulty in obtaining medication.  Next lab test in 3 months.

## 2018-04-19 NOTE — Patient Instructions (Signed)
Description   Do not take warfarin tonight. We are transitioning you to Xarelto 15 mg daily with supper. We will recheck labs in ~3 months. Call us with any concerns # [phone].

## 2018-06-26 ENCOUNTER — Other Ambulatory Visit: Payer: Self-pay

## 2018-06-26 ENCOUNTER — Encounter (INDEPENDENT_AMBULATORY_CARE_PROVIDER_SITE_OTHER): Payer: Medicare Other | Admitting: Ophthalmology

## 2018-06-26 DIAGNOSIS — H43813 Vitreous degeneration, bilateral: Secondary | ICD-10-CM | POA: Diagnosis not present

## 2018-06-26 DIAGNOSIS — H353231 Exudative age-related macular degeneration, bilateral, with active choroidal neovascularization: Secondary | ICD-10-CM | POA: Diagnosis not present

## 2018-08-15 ENCOUNTER — Other Ambulatory Visit: Payer: Self-pay

## 2018-08-15 MED ORDER — RIVAROXABAN 15 MG PO TABS: 15.0000 mg | ORAL_TABLET | Freq: Every day | ORAL | 5 refills | Status: DC

## 2018-08-15 NOTE — Telephone Encounter (Signed)
Pt last saw Dr Acie Fredrickson 02/06/18, last labs 02/06/18 Creat 0.84, age 85, weight 65.4kg, CrCl-48.71, based on CrCl pt is on appropriate dosage of Xarelto 15mg  QD.  Will refill rx.

## 2018-09-05 ENCOUNTER — Other Ambulatory Visit: Payer: Self-pay | Admitting: Cardiovascular Disease

## 2018-09-05 MED ORDER — ATENOLOL 25 MG PO TABS: 25.0000 mg | ORAL_TABLET | Freq: Every day | ORAL | 1 refills | Status: DC

## 2018-09-05 NOTE — Telephone Encounter (Signed)
Pt's medication was sent to pt's pharmacy as requested. Confirmation received.  °

## 2018-09-25 ENCOUNTER — Other Ambulatory Visit: Payer: Self-pay

## 2018-09-25 ENCOUNTER — Encounter (INDEPENDENT_AMBULATORY_CARE_PROVIDER_SITE_OTHER): Payer: Medicare Other | Admitting: Ophthalmology

## 2018-09-25 DIAGNOSIS — H43813 Vitreous degeneration, bilateral: Secondary | ICD-10-CM | POA: Diagnosis not present

## 2018-09-25 DIAGNOSIS — H353231 Exudative age-related macular degeneration, bilateral, with active choroidal neovascularization: Secondary | ICD-10-CM | POA: Diagnosis not present

## 2018-10-05 ENCOUNTER — Ambulatory Visit (INDEPENDENT_AMBULATORY_CARE_PROVIDER_SITE_OTHER): Payer: Medicare Other | Admitting: Cardiovascular Disease

## 2018-10-05 ENCOUNTER — Encounter: Payer: Self-pay | Admitting: Cardiovascular Disease

## 2018-10-05 ENCOUNTER — Other Ambulatory Visit: Payer: Self-pay

## 2018-10-05 VITALS — BP 125/60 | HR 75 | Ht 60.0 in | Wt 155.0 lb

## 2018-10-05 DIAGNOSIS — I4819 Other persistent atrial fibrillation: Secondary | ICD-10-CM

## 2018-10-05 DIAGNOSIS — E785 Hyperlipidemia, unspecified: Secondary | ICD-10-CM | POA: Diagnosis not present

## 2018-10-05 DIAGNOSIS — I10 Essential (primary) hypertension: Secondary | ICD-10-CM | POA: Diagnosis not present

## 2018-10-05 DIAGNOSIS — I2510 Atherosclerotic heart disease of native coronary artery without angina pectoris: Secondary | ICD-10-CM | POA: Diagnosis not present

## 2018-10-05 NOTE — Patient Instructions (Signed)
Medication Instructions:  Your physician recommends that you continue on your current medications as directed. Please refer to the Current Medication list given to you today.  If you need a refill on your cardiac medications before your next appointment, please call your pharmacy.   Lab work: None Ordered   Testing/Procedures: None Ordered   Follow-Up: At [facility], you and your health needs are our priority.  As part of our continuing mission to provide you with exceptional heart care, we have created designated Provider Care Teams.  These Care Teams include your primary Cardiologist (physician) and Advanced Practice Providers (APPs -  Physician Assistants and Nurse Practitioners) who all work together to provide you with the care you need, when you need it. You will need a follow up appointment in:  1 year.  Please call our office 2 months in advance to schedule this appointment.  You may see Philip Nahser, MD or one of the following Advanced Practice Providers on your designated Care Team: Scott Weaver, PA-C Vin Bhagat, PA-C . Janine Hammond, NP   

## 2018-10-05 NOTE — Progress Notes (Signed)
Cardiology Office Note   Date:  10/05/2018   ID:  Ellen Barajas, DOB 1928/04/10, MRN [id number]  PCP:  Ellen Bush, MD  Cardiologist: Ellen Coco MD --->  Ellen Barajas   Chief Complaint  Patient presents with  . Cardiomyopathy   Problem list 1. Coronary artery disease-status post coronary artery bypass grafting 2. Atrial fibrillation - persistent  3. Hyperlipidemia    Previous notes from Ellen Barajas is a 85 y.o. female who presents for scheduled follow-up visit  This pleasant 85 year old woman is seen for a scheduled four-month followup office visit. She has a history of ischemic heart disease. She has had previous CABG. [name] been in chronic atrial fibrillation. She underwent cardioversion on 10/23/09 but 1 week later was back in atrial fibrillation. We have made no further attempts at cardioversion. She is essentially asymptomatic with her rhythm. She has a history of hypercholesterolemia and is on over-the-counter preparations. We are checking lipids today. Since last visit she has had no new cardiac symptoms. She remains on long-term Coumadin. Her post herpetic neuralgia pain is improved. She has osteoarthritis of the knees. She does not take anything for it. I suggested that she try some generic Tylenol Since her last visit she has not been experiencing any TIA or stroke symptoms.  No increased exertional dyspnea.  No chest pain.  Nov. 13, 2017:  Ellen Barajas is seen today for initial visit .   Seen with husband, Ellen Barajas.   Ellen Barajas's older sister.  No CP or dyspnea . Is in chronic atrial fib.   She is completely asymptomatic.     Jun 13, 2016:    Seen back today for follow up HTN BP is elevated.   December 13, 2016: Ellen Barajas is seen back today for follow-up visit of her coronary artery disease, hypertension, atrial fibrillation. No CP or dyspnea Having some eye issues.   Getting laser surgery   July 26, 2017:  Ellen Barajas is  seen today for a follow-up visit.   Has a history of coronary artery disease and is status post coronary artery bypass grafting.  History of atrial fibrillation and is on warfarin. She is taking care of her husband at [facility].   Plans on going home when she can  Doing great.    February 06, 2018: Seen with Ellen Barajas, Niece. She is Ellen Barajas 's sister .  This is seen today for follow-up of her coronary artery disease and coronary artery bypass grafting.  She also has permanent atrial fibrillation.  She fell and broke her hip this past September.  She had surgical repair and seems to be getting along quite well.  Her husband has passed away since I last saw Doing well from a cardiac standpoint.    No CP or dyspena  She wants to change to a DOAC instead of warfarin    Sept. 11, 2020  Last Weight  Most recent update: 10/05/2018  3:04 PM   Weight  70.3 kg (155 lb)           Feeling well.     Seen with Ellen Barajas ( Niece)  Still in Afib.  Avoids salt .   Active around her house.   Does some gardening  Doing well on the Xarelto 15 mg   Past Medical History:  Diagnosis Date  . AF (atrial fibrillation) (HCC)    coumadin  . Arthritis    "hands" (09/27/2017)  . Coronary artery disease 2002   s/p  CABG  . Diverticulosis   . Dyslipidemia   . History of chicken pox   . IHD (ischemic heart disease)   . Myocardial infarction (HCC) ~ 2002  . Post herpetic neuralgia 02/2012    Past Surgical History:  Procedure Laterality Date  . BREAST LUMPECTOMY Left 1992   excision of fibrous tumor/notes 06/19/2000  . CARDIAC CATHETERIZATION  06/08/2000  . CATARACT EXTRACTION W/ INTRAOCULAR LENS  IMPLANT, BILATERAL Bilateral 2013  . CORONARY ARTERY BYPASS GRAFT  2002   x 4  . EYE SURGERY Bilateral 1989  . FEMUR IM NAIL Left 09/28/2017   Procedure: INTRAMEDULLARY (IM) NAIL FEMORAL;  Surgeon: Sheral ApleyMurphy, Timothy D, MD;  Location: [facility] OR;  Service: Orthopedics;  Laterality: Left;     Current Outpatient Medications  Medication Sig Dispense Refill   . atenolol (TENORMIN) 25 MG tablet Take 1 tablet (25 mg total) by mouth daily. 90 tablet 1  . losartan (COZAAR) 25 MG tablet Take 1 tablet (25 mg total) by mouth daily. 90 tablet 3  . Rivaroxaban (XARELTO) 15 MG TABS tablet Take 1 tablet (15 mg total) by mouth daily with supper. 30 tablet 5   No current facility-administered medications for this visit.     Allergies:   Crestor [rosuvastatin calcium], Lipitor [atorvastatin calcium], Niaspan [niacin er], Other, Pravastatin, Ramipril [ramipril], Welchol [colesevelam hcl], and Zetia [ezetimibe]    Social History:  The patient  reports that she has never smoked. She has never used smokeless tobacco. She reports that she does not drink alcohol or use drugs.   Family History:  The patient's family history includes CAD (age of onset: 1474) in her father; CAD (age of onset: 3590) in her mother.    ROS:  Please see the history of present illness.   Otherwise, review of systems are positive for none.   All other systems are reviewed and negative.    Physical Exam: Blood pressure (!) 172/74, pulse 75, height 5' (1.524 m), weight 155 lb (70.3 kg), SpO2 96 %.  GEN:  Elderly female,  nad  HEENT: Normal NECK: No JVD; No carotid bruits LYMPHATICS: No lymphadenopathy CARDIAC:  Irreg Irreg.  2/6 systolic murmur  RESPIRATORY:  Clear to auscultation without rales, wheezing or rhonchi  ABDOMEN: Soft, non-tender, non-distended MUSCULOSKELETAL:  No edema; No deformity  SKIN: Warm and dry NEUROLOGIC:  Alert and oriented x 3   EKG: .  Sept. 11, 2020 :   Atrial fib at 7375.   Minimal voltage cirteria for LVH .     Recent Labs: 02/06/2018: ALT 18; BUN 15; Creatinine, Ser 0.84; Potassium 4.4; Sodium 142    Lipid Panel    Component Value Date/Time   CHOL 211 (H) 02/06/2018 1036   TRIG 118 02/06/2018 1036   HDL 63 02/06/2018 1036   CHOLHDL 3.3 02/06/2018 1036   CHOLHDL 3 09/01/2016 1102   VLDL 12.8 09/01/2016 1102   LDLCALC 124 (H) 02/06/2018 1036    LDLDIRECT 147.5 12/17/2012 0920      Wt Readings from Last 3 Encounters:  10/05/18 155 lb (70.3 kg)  02/06/18 144 lb 3.2 oz (65.4 kg)  09/27/17 148 lb 13 oz (67.5 kg)        ASSESSMENT AND PLAN:  1. Ischemic heart disease status post CABG -she denies having any angina.  Overall seems to be doing very well.   2. permanent atrial fibrillation-    she is currently on Xarelto.  Seems to be doing fairly well with that.  3. Hypercholesterolemia-  labs have been okay.  Continue current medications.   4.  HTN:     Blood pressures well controlled.   Kristeen Miss, MD  10/05/2018 3:22 PM    [facility] [address] [place],  [address] [place], [place]  [address] Pager [phone] Phone: [phone]; Fax: [phone]

## 2018-11-21 ENCOUNTER — Encounter (HOSPITAL_COMMUNITY): Payer: Self-pay

## 2018-11-21 ENCOUNTER — Emergency Department (HOSPITAL_COMMUNITY): Payer: Medicare Other

## 2018-11-21 ENCOUNTER — Other Ambulatory Visit: Payer: Self-pay

## 2018-11-21 ENCOUNTER — Inpatient Hospital Stay (HOSPITAL_COMMUNITY)
Admission: EM | Admit: 2018-11-21 | Discharge: 2018-11-25 | DRG: 482 | Disposition: A | Discharge status: Home Health | Payer: Medicare Other | Attending: Internal Medicine | Admitting: Internal Medicine

## 2018-11-21 DIAGNOSIS — I480 Paroxysmal atrial fibrillation: Secondary | ICD-10-CM | POA: Diagnosis not present

## 2018-11-21 DIAGNOSIS — Z9842 Cataract extraction status, left eye: Secondary | ICD-10-CM

## 2018-11-21 DIAGNOSIS — I10 Essential (primary) hypertension: Secondary | ICD-10-CM | POA: Diagnosis present

## 2018-11-21 DIAGNOSIS — Y93H1 Activity, digging, shoveling and raking: Secondary | ICD-10-CM | POA: Diagnosis not present

## 2018-11-21 DIAGNOSIS — S72142A Displaced intertrochanteric fracture of left femur, initial encounter for closed fracture: Secondary | ICD-10-CM

## 2018-11-21 DIAGNOSIS — Z8249 Family history of ischemic heart disease and other diseases of the circulatory system: Secondary | ICD-10-CM

## 2018-11-21 DIAGNOSIS — Z03818 Encounter for observation for suspected exposure to other biological agents ruled out: Secondary | ICD-10-CM | POA: Diagnosis not present

## 2018-11-21 DIAGNOSIS — Z20828 Contact with and (suspected) exposure to other viral communicable diseases: Secondary | ICD-10-CM | POA: Diagnosis present

## 2018-11-21 DIAGNOSIS — I252 Old myocardial infarction: Secondary | ICD-10-CM

## 2018-11-21 DIAGNOSIS — S72101D Unspecified trochanteric fracture of right femur, subsequent encounter for closed fracture with routine healing: Secondary | ICD-10-CM | POA: Diagnosis not present

## 2018-11-21 DIAGNOSIS — Y92007 Garden or yard of unspecified non-institutional (private) residence as the place of occurrence of the external cause: Secondary | ICD-10-CM | POA: Diagnosis not present

## 2018-11-21 DIAGNOSIS — Z79899 Other long term (current) drug therapy: Secondary | ICD-10-CM | POA: Diagnosis not present

## 2018-11-21 DIAGNOSIS — S72141D Displaced intertrochanteric fracture of right femur, subsequent encounter for closed fracture with routine healing: Secondary | ICD-10-CM | POA: Diagnosis not present

## 2018-11-21 DIAGNOSIS — Z7901 Long term (current) use of anticoagulants: Secondary | ICD-10-CM

## 2018-11-21 DIAGNOSIS — S7291XA Unspecified fracture of right femur, initial encounter for closed fracture: Secondary | ICD-10-CM | POA: Diagnosis present

## 2018-11-21 DIAGNOSIS — R52 Pain, unspecified: Secondary | ICD-10-CM | POA: Diagnosis not present

## 2018-11-21 DIAGNOSIS — Z66 Do not resuscitate: Secondary | ICD-10-CM | POA: Diagnosis not present

## 2018-11-21 DIAGNOSIS — S72101A Unspecified trochanteric fracture of right femur, initial encounter for closed fracture: Secondary | ICD-10-CM | POA: Diagnosis not present

## 2018-11-21 DIAGNOSIS — Z9841 Cataract extraction status, right eye: Secondary | ICD-10-CM

## 2018-11-21 DIAGNOSIS — W010XXA Fall on same level from slipping, tripping and stumbling without subsequent striking against object, initial encounter: Secondary | ICD-10-CM | POA: Diagnosis present

## 2018-11-21 DIAGNOSIS — I4891 Unspecified atrial fibrillation: Secondary | ICD-10-CM | POA: Diagnosis not present

## 2018-11-21 DIAGNOSIS — Z951 Presence of aortocoronary bypass graft: Secondary | ICD-10-CM | POA: Diagnosis not present

## 2018-11-21 DIAGNOSIS — T148XXA Other injury of unspecified body region, initial encounter: Secondary | ICD-10-CM

## 2018-11-21 DIAGNOSIS — M25551 Pain in right hip: Secondary | ICD-10-CM | POA: Diagnosis not present

## 2018-11-21 DIAGNOSIS — I119 Hypertensive heart disease without heart failure: Secondary | ICD-10-CM | POA: Diagnosis present

## 2018-11-21 DIAGNOSIS — E785 Hyperlipidemia, unspecified: Secondary | ICD-10-CM | POA: Diagnosis present

## 2018-11-21 DIAGNOSIS — S72141A Displaced intertrochanteric fracture of right femur, initial encounter for closed fracture: Principal | ICD-10-CM | POA: Diagnosis present

## 2018-11-21 DIAGNOSIS — Z961 Presence of intraocular lens: Secondary | ICD-10-CM | POA: Diagnosis present

## 2018-11-21 DIAGNOSIS — S299XXA Unspecified injury of thorax, initial encounter: Secondary | ICD-10-CM | POA: Diagnosis not present

## 2018-11-21 DIAGNOSIS — S7221XA Displaced subtrochanteric fracture of right femur, initial encounter for closed fracture: Secondary | ICD-10-CM | POA: Diagnosis not present

## 2018-11-21 DIAGNOSIS — I2510 Atherosclerotic heart disease of native coronary artery without angina pectoris: Secondary | ICD-10-CM | POA: Diagnosis present

## 2018-11-21 LAB — TYPE AND SCREEN
ABO/RH(D): O POS
Antibody Screen: NEGATIVE

## 2018-11-21 LAB — CBC WITH DIFFERENTIAL/PLATELET
Abs Immature Granulocytes: 0.05 10*3/uL (ref 0.00–0.07)
Basophils Absolute: 0 10*3/uL (ref 0.0–0.1)
Basophils Relative: 0 %
Eosinophils Absolute: 0.2 10*3/uL (ref 0.0–0.5)
Eosinophils Relative: 2 %
HCT: 46.1 % — ABNORMAL HIGH (ref 36.0–46.0)
Hemoglobin: 14.6 g/dL (ref 12.0–15.0)
Immature Granulocytes: 1 %
Lymphocytes Relative: 11 %
Lymphs Abs: 0.8 10*3/uL (ref 0.7–4.0)
MCH: 30 pg (ref 26.0–34.0)
MCHC: 31.7 g/dL (ref 30.0–36.0)
MCV: 94.9 fL (ref 80.0–100.0)
Monocytes Absolute: 0.5 10*3/uL (ref 0.1–1.0)
Monocytes Relative: 6 %
Neutro Abs: 6.1 10*3/uL (ref 1.7–7.7)
Neutrophils Relative %: 80 %
Platelets: 229 10*3/uL (ref 150–400)
RBC: 4.86 MIL/uL (ref 3.87–5.11)
RDW: 12.7 % (ref 11.5–15.5)
WBC: 7.7 10*3/uL (ref 4.0–10.5)
nRBC: 0 % (ref 0.0–0.2)

## 2018-11-21 LAB — BASIC METABOLIC PANEL
Anion gap: 13 (ref 5–15)
BUN: 15 mg/dL (ref 8–23)
CO2: 21 mmol/L — ABNORMAL LOW (ref 22–32)
Calcium: 9.5 mg/dL (ref 8.9–10.3)
Chloride: 106 mmol/L (ref 98–111)
Creatinine, Ser: 0.85 mg/dL (ref 0.44–1.00)
GFR calc Af Amer: >60 mL/min (ref >60)
GFR calc non Af Amer: >60 mL/min (ref >60)
Glucose, Bld: 110 mg/dL — ABNORMAL HIGH (ref 70–99)
Potassium: 4.3 mmol/L (ref 3.5–5.1)
Sodium: 140 mmol/L (ref 135–145)

## 2018-11-21 LAB — ABO/RH: ABO/RH(D): O POS

## 2018-11-21 LAB — SARS CORONAVIRUS 2 BY RT PCR (HOSPITAL ORDER, PERFORMED IN ~~LOC~~ HOSPITAL LAB): SARS Coronavirus 2: NEGATIVE

## 2018-11-21 LAB — PROTIME-INR
INR: 1.2 (ref 0.8–1.2)
Prothrombin Time: 14.7 seconds (ref 11.4–15.2)

## 2018-11-21 MED ORDER — ONDANSETRON HCL 4 MG PO TABS: 4.0000 mg | ORAL_TABLET | Freq: Four times a day (QID) | ORAL | Status: DC | PRN

## 2018-11-21 MED ORDER — ACETAMINOPHEN 325 MG PO TABS: 650.0000 mg | ORAL_TABLET | Freq: Four times a day (QID) | ORAL | Status: DC | PRN

## 2018-11-21 MED ORDER — ACETAMINOPHEN 650 MG RE SUPP: 650.0000 mg | Freq: Four times a day (QID) | RECTAL | Status: DC | PRN

## 2018-11-21 MED ORDER — ONDANSETRON HCL 4 MG/2ML IJ SOLN: 4.0000 mg | Freq: Four times a day (QID) | INTRAMUSCULAR | Status: DC | PRN

## 2018-11-21 MED ORDER — ATENOLOL 25 MG PO TABS
25.0000 mg | ORAL_TABLET | Freq: Every day | ORAL | Status: DC
  Administered 2018-11-23 – 2018-11-25 (×3): 25 mg via ORAL
  Filled 2018-11-21 (×4): qty 1

## 2018-11-21 MED ORDER — MORPHINE SULFATE (PF) 2 MG/ML IV SOLN: 4.0000 mg | INTRAVENOUS | Status: DC | PRN

## 2018-11-21 MED ORDER — LOSARTAN POTASSIUM 50 MG PO TABS
25.0000 mg | ORAL_TABLET | Freq: Every day | ORAL | Status: DC
  Administered 2018-11-23 – 2018-11-25 (×3): 25 mg via ORAL
  Filled 2018-11-21 (×3): qty 1

## 2018-11-21 NOTE — ED Provider Notes (Addendum)
[facility] EMERGENCY DEPARTMENT Provider Note   CSN: [id number] Arrival date & time: 11/21/18  1337     History   Chief Complaint Chief Complaint  Patient presents with   Fall    HPI Ellen Barajas is a 85 y.o. female.     Patient is an 85 year old female with a history of A. fib on xarelto, CAD status post CABG and hypertension who presents today after a fall at home.  Patient was raking leaves this morning and states she pivoted to get a pile of leaves and lost her balance falling into the leaves onto the ground.  Patient states since the fall her leg would not work and she was unable to stand so she crawled back to the house and called for help.  Patient states now the numbness in her leg is starting to go away but she still unable to move it.  She denies any head injury or loss of consciousness.  She has no pain anywhere else.  The history is provided by the patient.  Fall This is a new problem. The current episode started 1 to 2 hours ago. The problem occurs constantly. The problem has not changed since onset.Associated symptoms comments: Unable to walk or move the right leg.. The symptoms are aggravated by bending, twisting and walking. The symptoms are relieved by rest. She has tried nothing for the symptoms. The treatment provided no relief.    Past Medical History:  Diagnosis Date   AF (atrial fibrillation) (HCC)    coumadin   Arthritis    "hands" (09/27/2017)   Coronary artery disease 2002   s/p CABG   Diverticulosis    Dyslipidemia    History of chicken pox    IHD (ischemic heart disease)    Myocardial infarction (HCC) ~ 2002   Post herpetic neuralgia 02/2012    Patient Active Problem List   Diagnosis Date Noted   Hip fracture (HCC) 09/27/2017   Health maintenance examination 09/21/2016   Essential hypertension 04/11/2016   Medicare annual wellness visit, subsequent 10/16/2014   Advanced care planning/counseling discussion  10/16/2014   DNR (do not resuscitate) 10/16/2014   Osteoarthritis 04/19/2013   Hx of CABG 12/17/2012   Post herpetic neuralgia 02/25/2012   IHD (ischemic heart disease)    Dyslipidemia    CAD (coronary artery disease)     Past Surgical History:  Procedure Laterality Date   BREAST LUMPECTOMY Left 1992   excision of fibrous tumor/notes 06/19/2000   CARDIAC CATHETERIZATION  06/08/2000   CATARACT EXTRACTION W/ INTRAOCULAR LENS  IMPLANT, BILATERAL Bilateral 2013   CORONARY ARTERY BYPASS GRAFT  2002   x 4   EYE SURGERY Bilateral 1989   FEMUR IM NAIL Left 09/28/2017   Procedure: INTRAMEDULLARY (IM) NAIL FEMORAL;  Surgeon: Sheral Apley, MD;  Location: [facility] OR;  Service: Orthopedics;  Laterality: Left;     OB History   No obstetric history on file.      Home Medications    Prior to Admission medications   Medication Sig Start Date End Date Taking? Authorizing Provider  atenolol (TENORMIN) 25 MG tablet Take 1 tablet (25 mg total) by mouth daily. 09/05/18   Nahser, Deloris Ping, MD  losartan (COZAAR) 25 MG tablet Take 1 tablet (25 mg total) by mouth daily. 03/20/18   Nahser, Deloris Ping, MD  Rivaroxaban (XARELTO) 15 MG TABS tablet Take 1 tablet (15 mg total) by mouth daily with supper. 08/15/18   Nahser, Deloris Ping, MD  Family History Family History  Problem Relation Age of Onset   CAD Father 6874       MI   CAD Mother 7590       MI    Social History Social History   Tobacco Use   Smoking status: Never Smoker   Smokeless tobacco: Never Used  Substance Use Topics   Alcohol use: Never    Frequency: Never   Drug use: Never     Allergies   Crestor [rosuvastatin calcium], Lipitor [atorvastatin calcium], Niaspan [niacin er], Other, Pravastatin, Ramipril [ramipril], Welchol [colesevelam hcl], and Zetia [ezetimibe]   Review of Systems Review of Systems  All other systems reviewed and are negative.    Physical Exam Updated Vital Signs BP (!) 202/84 (BP  Location: Right Arm)    Pulse 83    Temp 97.9 F (36.6 C) (Oral)    Resp 16    Ht 5\' 3"  (1.6 m)    Wt 70.3 kg    SpO2 98%    BMI 27.46 kg/m   Physical Exam Vitals signs and nursing note reviewed.  Constitutional:      General: She is not in acute distress.    Appearance: Normal appearance. She is well-developed and normal weight.  HENT:     Head: Normocephalic and atraumatic.  Eyes:     Conjunctiva/sclera: Conjunctivae normal.     Pupils: Pupils are equal, round, and reactive to light.  Neck:     Musculoskeletal: Normal range of motion and neck supple.  Cardiovascular:     Rate and Rhythm: Normal rate. Rhythm irregularly irregular.     Heart sounds: No murmur.  Pulmonary:     Effort: Pulmonary effort is normal. No respiratory distress.     Breath sounds: Normal breath sounds. No wheezing or rales.  Abdominal:     General: There is no distension.     Palpations: Abdomen is soft.     Tenderness: There is no abdominal tenderness. There is no guarding or rebound.  Musculoskeletal: Normal range of motion.        General: Tenderness and deformity present.     Right hip: She exhibits tenderness, bony tenderness and deformity.       Legs:  Skin:    General: Skin is warm and dry.     Capillary Refill: Capillary refill takes 2 to 3 seconds.     Findings: No erythema or rash.  Neurological:     General: No focal deficit present.     Mental Status: She is alert and oriented to person, place, and time. Mental status is at baseline.  Psychiatric:        Mood and Affect: Mood normal.        Behavior: Behavior normal.        Thought Content: Thought content normal.      ED Treatments / Results  Labs (all labs ordered are listed, but only abnormal results are displayed) Labs Reviewed  BASIC METABOLIC PANEL - Abnormal; Notable for the following components:      Result Value   CO2 21 (*)    Glucose, Bld 110 (*)    All other components within normal limits  CBC WITH  DIFFERENTIAL/PLATELET - Abnormal; Notable for the following components:   HCT 46.1 (*)    All other components within normal limits  SARS CORONAVIRUS 2 BY RT PCR (HOSPITAL ORDER, PERFORMED IN [facility])  PROTIME-INR  TYPE AND SCREEN  ABO/RH    EKG EKG  Interpretation  Date/Time:  Wednesday November 21 2018 14:11:48 EDT Ventricular Rate:  86 PR Interval:    QRS Duration: 91 QT Interval:  361 QTC Calculation: 432 R Axis:   65 Text Interpretation: Atrial fibrillation Consider left ventricular hypertrophy No significant change since last tracing Confirmed by Blanchie Dessert ([id number]) on 11/21/2018 2:14:58 PM   Radiology Dg Chest Port 1 View  Result Date: 11/21/2018 CLINICAL DATA:  Medical clearance, fall with hip pain. EXAM: PORTABLE CHEST 1 VIEW COMPARISON:  09/27/2017. FINDINGS: Trachea is midline. Heart is enlarged, stable. Thoracic aorta is calcified. Lungs are hyperinflated but clear. No pleural fluid. Osseous structures appear grossly intact. IMPRESSION: 1. Hyperinflation without acute finding. 2.  Aortic atherosclerosis (ICD10-170.0). Electronically Signed   By: Lorin Picket M.D.   On: 11/21/2018 14:39   Dg Hip Unilat With Pelvis 2-3 Views Right  Result Date: 11/21/2018 CLINICAL DATA:  Fall with hip pain shortening and rotation of the right hip EXAM: DG HIP (WITH OR WITHOUT PELVIS) 2-3V RIGHT COMPARISON:  09/27/2017 FINDINGS: Prior intramedullary rodding of the left femur for prior intertrochanteric fracture. Safety pins over the medial upper thighs. Pubic symphysis appears intact. SI joints are non widened. Acute right intertrochanteric fracture. Femoral head position is normal. Possible fracture deformity of the right superior pubic ramus. IMPRESSION: 1. Acute right intertrochanteric fracture 2. Possible fracture deformity right superior pubic ramus 3. Prior intramedullary rodding of the left femur. Electronically Signed   By: Donavan Foil M.D.   On: 11/21/2018  14:40    Procedures Procedures (including critical care time)  Medications Ordered in ED Medications - No data to display   Initial Impression / Assessment and Plan / ED Course  I have reviewed the triage vital signs and the nursing notes.  Pertinent labs & imaging results that were available during my care of the patient were reviewed by me and considered in my medical decision making (see chart for details).        Patient is an elderly female with a fall today when she was raking her leaves and lost her balance.  Patient had no head injury or loss of consciousness.  No preceding symptoms.  Patient was unable to stand or walk and has mild deformity of the right hip and pain with range of motion.  Suspect hip fracture versus dislocation versus pelvic fracture.  Hip fracture protocol initiated.  Patient does take xarlto due to paroxysmal atrial fibrillation.  No concern for stroke at this time.  3:34 PM Patient's hip film shows no acute right intro trochanteric fracture and possible right superior pubic rami fracture.  Patient's labs without acute findings.  Chest x-ray is clear.  EKG is unchanged with atrial fibrillation.  Dr. Fredonia Highland repaired patient's left hip and she is requesting to have him evaluate her for this hip fracture.  Will admit to medicine for hip fracture protocol.  4:06 PM  Spoke with Fredonia Highland and he will plan on seeing the patient tomorrow and repairing her hip.  She needs to be n.p.o. after midnight.  Final Clinical Impressions(s) / ED Diagnoses   Final diagnoses:  Bilateral intertrochanteric hip fractures [facility])    ED Discharge Orders    None       Blanchie Dessert, MD 11/21/18 1537    Blanchie Dessert, MD 11/21/18 1608    Blanchie Dessert, MD 11/21/18 [phone]

## 2018-11-21 NOTE — ED Notes (Signed)
Attempted to call report

## 2018-11-21 NOTE — Consult Note (Signed)
I have reviewed xrays. Plan for IM nail tomorrow(thursday)  Formal consult to follow   Ellen Barajas

## 2018-11-21 NOTE — H&P (Signed)
History and Physical    MIKIYAH Barajas ENI:[id number] DOB: 22-Feb-1928 DOA: 11/21/2018  PCP: Ria Bush, MD  Patient coming from: Home I have personally briefly reviewed patient's old medical records in [place]  Chief Complaint: Status post fall  HPI: Ellen Barajas is a 85 y.o. female with medical history significant of hypertension, paroxysmal A. fib on Xarelto, coronary artery disease status post CABG presents to emergency department after a fall at home.  Patient reports that she was raking leaves this morning and she lost her balance and fell into the ground.  Denies head trauma, lightheadedness, dizziness, nausea, vomiting, loss of consciousness, seizures.  Patient reports that after fall she was not able to stand up so she crawled back to the house and called for the help.  Denies headache, blurry vision, chest pain, shortness of breath, palpitation, leg swelling, generalized weakness, lethargic, fever or chills.  She lives alone and independent on daily life activities.  She is compliant with her home medications.  ED Course: Upon arrival patient blood pressure found to be elevated.  X-ray of right hip shows acute right intertrochanteric fracture.  Possible fracture deformity right superior pubic ramus.  EDP consulted orthopedic surgery who recommended surgery tomorrow a.m.  N.p.o. after midnight.    Review of Systems: As per HPI otherwise negative.    Past Medical History:  Diagnosis Date   AF (atrial fibrillation) ([place])    coumadin   Arthritis    "hands" (09/27/2017)   Coronary artery disease 2002   s/p CABG   Diverticulosis    Dyslipidemia    History of chicken pox    IHD (ischemic heart disease)    Myocardial infarction ([place]) ~ 2002   Post herpetic neuralgia 02/2012    Past Surgical History:  Procedure Laterality Date   BREAST LUMPECTOMY Left 1992   excision of fibrous tumor/notes 06/19/2000   CARDIAC CATHETERIZATION  06/08/2000   CATARACT  EXTRACTION W/ INTRAOCULAR LENS  IMPLANT, BILATERAL Bilateral 2013   CORONARY ARTERY BYPASS GRAFT  2002   x 4   EYE SURGERY Bilateral 1989   FEMUR IM NAIL Left 09/28/2017   Procedure: INTRAMEDULLARY (IM) NAIL FEMORAL;  Surgeon: Renette Butters, MD;  Location: [place];  Service: Orthopedics;  Laterality: Left;     reports that she has never smoked. She has never used smokeless tobacco. She reports that she does not drink alcohol or use drugs.  Allergies  Allergen Reactions   Crestor [Rosuvastatin Calcium]     unknown   Lipitor [Atorvastatin Calcium]     unknown   Niaspan [Niacin Er]     unknown   Other     PER PATIENT SHE DOES NOT TOLERATE NARCOTICS   Pravastatin     unknown   Ramipril [Ramipril]     Hyperkalemia   Welchol [Colesevelam Hcl]     unknown   Zetia [Ezetimibe]     unknown    Family History  Problem Relation Age of Onset   CAD Father 13       MI   CAD Mother 76       MI    Prior to Admission medications   Medication Sig Start Date End Date Taking? Authorizing Provider  atenolol (TENORMIN) 25 MG tablet Take 1 tablet (25 mg total) by mouth daily. 09/05/18   Nahser, Wonda Cheng, MD  losartan (COZAAR) 25 MG tablet Take 1 tablet (25 mg total) by mouth daily. 03/20/18   Nahser, Wonda Cheng, MD  Rivaroxaban (XARELTO) 15 MG TABS tablet Take 1 tablet (15 mg total) by mouth daily with supper. 08/15/18   Nahser, Deloris PingPhilip J, MD    Physical Exam: Vitals:   11/21/18 1342 11/21/18 1347 11/21/18 1430 11/21/18 1512  BP: (!) 202/84  (!) 181/75 (!) 159/86  Pulse: 83  80 93  Resp: 16  14 (!) 26  Temp: 97.9 F (36.6 C)     TempSrc: Oral     SpO2: 99% 98% 100% 99%  Weight:      Height:        Constitutional: NAD, calm, comfortable Vitals:   11/21/18 1342 11/21/18 1347 11/21/18 1430 11/21/18 1512  BP: (!) 202/84  (!) 181/75 (!) 159/86  Pulse: 83  80 93  Resp: 16  14 (!) 26  Temp: 97.9 F (36.6 C)     TempSrc: Oral     SpO2: 99% 98% 100% 99%  Weight:       Height:       Constitutional: Alert and oriented x4, not in acute distress, communicating well.   Eyes: PERRL, lids and conjunctivae normal ENMT: Mucous membranes are moist. Posterior pharynx clear of any exudate or lesions.Normal dentition.  Neck: normal, supple, no masses, no thyromegaly Respiratory: clear to auscultation bilaterally, no wheezing, no crackles. Normal respiratory effort. No accessory muscle use.  Cardiovascular: Regular rate and rhythm, no murmurs / rubs / gallops. No extremity edema. 2+ pedal pulses. No carotid bruits.  Abdomen: no tenderness, no masses palpated. No hepatosplenomegaly. Bowel sounds positive.  Musculoskeletal: Right leg: Sensation intact, tenderness positive on lateral aspect of thigh.  Able to wiggle her toes.  Intact pulsation. Skin: no rashes, lesions, ulcers. No induration Neurologic: CN 2-12 grossly intact. Sensation intact, DTR normal. Strength 5/5 in all 4.  Psychiatric: Normal judgment and insight. Alert and oriented x 3. Normal mood.    Labs on Admission: I have personally reviewed following labs and imaging studies  CBC: Recent Labs  Lab 11/21/18 1403  WBC 7.7  NEUTROABS 6.1  HGB 14.6  HCT 46.1*  MCV 94.9  PLT 229   Basic Metabolic Panel: Recent Labs  Lab 11/21/18 1403  NA 140  K 4.3  CL 106  CO2 21*  GLUCOSE 110*  BUN 15  CREATININE 0.85  CALCIUM 9.5   GFR: Estimated Creatinine Clearance: 43 mL/min (by C-G formula based on SCr of 0.85 mg/dL). Liver Function Tests: No results for input(s): AST, ALT, ALKPHOS, BILITOT, PROT, ALBUMIN in the last 168 hours. No results for input(s): LIPASE, AMYLASE in the last 168 hours. No results for input(s): AMMONIA in the last 168 hours. Coagulation Profile: Recent Labs  Lab 11/21/18 1403  INR 1.2   Cardiac Enzymes: No results for input(s): CKTOTAL, CKMB, CKMBINDEX, TROPONINI in the last 168 hours. BNP (last 3 results) No results for input(s): PROBNP in the last 8760  hours. HbA1C: No results for input(s): HGBA1C in the last 72 hours. CBG: No results for input(s): GLUCAP in the last 168 hours. Lipid Profile: No results for input(s): CHOL, HDL, LDLCALC, TRIG, CHOLHDL, LDLDIRECT in the last 72 hours. Thyroid Function Tests: No results for input(s): TSH, T4TOTAL, FREET4, T3FREE, THYROIDAB in the last 72 hours. Anemia Panel: No results for input(s): VITAMINB12, FOLATE, FERRITIN, TIBC, IRON, RETICCTPCT in the last 72 hours. Urine analysis: No results found for: COLORURINE, APPEARANCEUR, LABSPEC, PHURINE, GLUCOSEU, HGBUR, BILIRUBINUR, KETONESUR, PROTEINUR, UROBILINOGEN, NITRITE, LEUKOCYTESUR  Radiological Exams on Admission: Dg Chest Port 1 View  Result Date: 11/21/2018 CLINICAL DATA:  Medical clearance, fall with hip pain. EXAM: PORTABLE CHEST 1 VIEW COMPARISON:  09/27/2017. FINDINGS: Trachea is midline. Heart is enlarged, stable. Thoracic aorta is calcified. Lungs are hyperinflated but clear. No pleural fluid. Osseous structures appear grossly intact. IMPRESSION: 1. Hyperinflation without acute finding. 2.  Aortic atherosclerosis (ICD10-170.0). Electronically Signed   By: Leanna Battles M.D.   On: 11/21/2018 14:39   Dg Hip Unilat With Pelvis 2-3 Views Right  Result Date: 11/21/2018 CLINICAL DATA:  Fall with hip pain shortening and rotation of the right hip EXAM: DG HIP (WITH OR WITHOUT PELVIS) 2-3V RIGHT COMPARISON:  09/27/2017 FINDINGS: Prior intramedullary rodding of the left femur for prior intertrochanteric fracture. Safety pins over the medial upper thighs. Pubic symphysis appears intact. SI joints are non widened. Acute right intertrochanteric fracture. Femoral head position is normal. Possible fracture deformity of the right superior pubic ramus. IMPRESSION: 1. Acute right intertrochanteric fracture 2. Possible fracture deformity right superior pubic ramus 3. Prior intramedullary rodding of the left femur. Electronically Signed   By: Jasmine Pang M.D.    On: 11/21/2018 14:40    EKG: A. fib with heart rate of 86.  No ST elevation or depression.  Assessment/Plan Principal Problem:   Femur fracture, right (HCC) Active Problems:   Essential hypertension   AF (paroxysmal atrial fibrillation) (HCC)   Acute right intertrochanteric fracture: -Status post fall. -Right hip x-ray as above. -We will admit patient on the floor. -Morphine as needed for pain.  Monitor H&H and vitals. -N.p.o. after midnight. -EDP consulted orthopedic surgery-plan for surgery tomorrow   Hypertension: Elevated upon arrival -Likely secondary to pain. -We will continue losartan and atenolol and monitor blood pressure closely.  Paroxysmal A. fib: Rate controlled -Continue atenolol and hold Xarelto  -On telemetry and monitor heart rate closely.   DVT prophylaxis: TED/SCD-hold Xarelto for  surgery tomorrow morning. Code Status: Full code  family Communication: None present at bedside.  Plan of care discussed with patient in length and he verbalized understanding and agreed with it. Disposition Plan: TBD Consults called: Orthopedic surgery by EDP  admission status: Inpatient  Ollen Bowl MD [facility] Pager [phone]  If 7PM-7AM, please contact night-coverage [email or web address] Password [email or web address]  11/21/2018, 6:06 PM

## 2018-11-21 NOTE — ED Triage Notes (Signed)
Pt presents via [facility] from home, raking sleeves this am prior to the rain, slipped and fell, bystander found her on the ground, down approx 30 mins. shortening and rotation to right hip. Pt c/o numbness. Palpable pulses

## 2018-11-22 ENCOUNTER — Encounter (HOSPITAL_COMMUNITY): Payer: Self-pay | Admitting: *Deleted

## 2018-11-22 ENCOUNTER — Encounter (HOSPITAL_COMMUNITY)
Admission: EM | Disposition: A | Discharge status: Home Health | Payer: Self-pay | Source: Home / Self Care | Attending: Internal Medicine

## 2018-11-22 ENCOUNTER — Inpatient Hospital Stay (HOSPITAL_COMMUNITY): Payer: Medicare Other | Admitting: Anesthesiology

## 2018-11-22 ENCOUNTER — Inpatient Hospital Stay (HOSPITAL_COMMUNITY): Payer: Medicare Other

## 2018-11-22 DIAGNOSIS — I10 Essential (primary) hypertension: Secondary | ICD-10-CM

## 2018-11-22 DIAGNOSIS — I480 Paroxysmal atrial fibrillation: Secondary | ICD-10-CM

## 2018-11-22 DIAGNOSIS — S72101D Unspecified trochanteric fracture of right femur, subsequent encounter for closed fracture with routine healing: Secondary | ICD-10-CM

## 2018-11-22 HISTORY — PX: INTRAMEDULLARY (IM) NAIL INTERTROCHANTERIC: SHX5875

## 2018-11-22 LAB — CBC
HCT: 40.8 % (ref 36.0–46.0)
Hemoglobin: 13.3 g/dL (ref 12.0–15.0)
MCH: 30.4 pg (ref 26.0–34.0)
MCHC: 32.6 g/dL (ref 30.0–36.0)
MCV: 93.4 fL (ref 80.0–100.0)
Platelets: 197 10*3/uL (ref 150–400)
RBC: 4.37 MIL/uL (ref 3.87–5.11)
RDW: 12.7 % (ref 11.5–15.5)
WBC: 8.4 10*3/uL (ref 4.0–10.5)
nRBC: 0 % (ref 0.0–0.2)

## 2018-11-22 LAB — SURGICAL PCR SCREEN
MRSA, PCR: NEGATIVE
Staphylococcus aureus: NEGATIVE

## 2018-11-22 LAB — BASIC METABOLIC PANEL WITH GFR
Anion gap: 11 (ref 5–15)
BUN: 16 mg/dL (ref 8–23)
CO2: 20 mmol/L — ABNORMAL LOW (ref 22–32)
Calcium: 8.8 mg/dL — ABNORMAL LOW (ref 8.9–10.3)
Chloride: 107 mmol/L (ref 98–111)
Creatinine, Ser: 0.82 mg/dL (ref 0.44–1.00)
GFR calc Af Amer: >60 mL/min
GFR calc non Af Amer: >60 mL/min
Glucose, Bld: 93 mg/dL (ref 70–99)
Potassium: 3.9 mmol/L (ref 3.5–5.1)
Sodium: 138 mmol/L (ref 135–145)

## 2018-11-22 SURGERY — FIXATION, FRACTURE, INTERTROCHANTERIC, WITH INTRAMEDULLARY ROD
Anesthesia: General | Laterality: Right

## 2018-11-22 MED ORDER — FENTANYL CITRATE (PF) 100 MCG/2ML IJ SOLN: 25.0000 ug | INTRAMUSCULAR | Status: DC | PRN

## 2018-11-22 MED ORDER — LACTATED RINGERS IV SOLN
INTRAVENOUS | Status: DC
  Administered 2018-11-22: 12:00:00 via INTRAVENOUS

## 2018-11-22 MED ORDER — LACTATED RINGERS IV SOLN
INTRAVENOUS | Status: DC
  Administered 2018-11-22 (×2): via INTRAVENOUS

## 2018-11-22 MED ORDER — ONDANSETRON HCL 4 MG PO TABS: 4.0000 mg | ORAL_TABLET | Freq: Four times a day (QID) | ORAL | Status: DC | PRN

## 2018-11-22 MED ORDER — ROCURONIUM BROMIDE 10 MG/ML (PF) SYRINGE
PREFILLED_SYRINGE | INTRAVENOUS | Status: AC
  Filled 2018-11-22: qty 10

## 2018-11-22 MED ORDER — ESMOLOL HCL 100 MG/10ML IV SOLN
INTRAVENOUS | Status: DC | PRN
  Administered 2018-11-22: 20 mg via INTRAVENOUS
  Administered 2018-11-22: 30 mg via INTRAVENOUS

## 2018-11-22 MED ORDER — LIDOCAINE 2% (20 MG/ML) 5 ML SYRINGE
INTRAMUSCULAR | Status: DC | PRN
  Administered 2018-11-22: 80 mg via INTRAVENOUS

## 2018-11-22 MED ORDER — HYDROMORPHONE HCL 1 MG/ML IJ SOLN
0.2500 mg | INTRAMUSCULAR | Status: DC | PRN
  Filled 2018-11-22: qty 1

## 2018-11-22 MED ORDER — ROCURONIUM BROMIDE 50 MG/5ML IV SOSY
PREFILLED_SYRINGE | INTRAVENOUS | Status: DC | PRN
  Administered 2018-11-22: 50 mg via INTRAVENOUS

## 2018-11-22 MED ORDER — RIVAROXABAN 15 MG PO TABS
15.0000 mg | ORAL_TABLET | Freq: Every day | ORAL | Status: DC
  Administered 2018-11-23 – 2018-11-25 (×3): 15 mg via ORAL
  Filled 2018-11-22 (×3): qty 1

## 2018-11-22 MED ORDER — ONDANSETRON HCL 4 MG/2ML IJ SOLN
4.0000 mg | Freq: Four times a day (QID) | INTRAMUSCULAR | Status: DC | PRN
  Administered 2018-11-25: 04:00:00 4 mg via INTRAVENOUS
  Filled 2018-11-22: qty 2

## 2018-11-22 MED ORDER — PHENYLEPHRINE 40 MCG/ML (10ML) SYRINGE FOR IV PUSH (FOR BLOOD PRESSURE SUPPORT)
PREFILLED_SYRINGE | INTRAVENOUS | Status: DC | PRN
  Administered 2018-11-22: 80 ug via INTRAVENOUS
  Administered 2018-11-22: 120 ug via INTRAVENOUS

## 2018-11-22 MED ORDER — OXYCODONE HCL 5 MG PO TABS: 5.0000 mg | ORAL_TABLET | Freq: Once | ORAL | Status: DC | PRN

## 2018-11-22 MED ORDER — PROMETHAZINE HCL 25 MG/ML IJ SOLN: 6.2500 mg | INTRAMUSCULAR | Status: DC | PRN

## 2018-11-22 MED ORDER — SODIUM CHLORIDE 0.9 % IR SOLN
Status: DC | PRN
  Administered 2018-11-22: 1000 mL

## 2018-11-22 MED ORDER — POLYETHYLENE GLYCOL 3350 17 G PO PACK
17.0000 g | PACK | Freq: Every day | ORAL | Status: DC | PRN
  Administered 2018-11-22: 21:00:00 17 g via ORAL
  Filled 2018-11-22 (×2): qty 1

## 2018-11-22 MED ORDER — ESMOLOL HCL 100 MG/10ML IV SOLN
INTRAVENOUS | Status: AC
  Filled 2018-11-22: qty 10

## 2018-11-22 MED ORDER — HYDROCODONE-ACETAMINOPHEN 5-325 MG PO TABS: 1.0000 | ORAL_TABLET | Freq: Four times a day (QID) | ORAL | 0 refills | Status: AC | PRN

## 2018-11-22 MED ORDER — FENTANYL CITRATE (PF) 100 MCG/2ML IJ SOLN
INTRAMUSCULAR | Status: DC | PRN
  Administered 2018-11-22: 50 ug via INTRAVENOUS
  Administered 2018-11-22: 25 ug via INTRAVENOUS
  Administered 2018-11-22: 50 ug via INTRAVENOUS

## 2018-11-22 MED ORDER — FENTANYL CITRATE (PF) 250 MCG/5ML IJ SOLN
INTRAMUSCULAR | Status: AC
  Filled 2018-11-22: qty 5

## 2018-11-22 MED ORDER — ONDANSETRON HCL 4 MG/2ML IJ SOLN
INTRAMUSCULAR | Status: AC
  Filled 2018-11-22: qty 2

## 2018-11-22 MED ORDER — FLEET ENEMA 7-19 GM/118ML RE ENEM: 1.0000 | ENEMA | Freq: Once | RECTAL | Status: DC | PRN

## 2018-11-22 MED ORDER — DEXAMETHASONE SODIUM PHOSPHATE 10 MG/ML IJ SOLN
INTRAMUSCULAR | Status: AC
  Filled 2018-11-22: qty 1

## 2018-11-22 MED ORDER — DOCUSATE SODIUM 100 MG PO CAPS
100.0000 mg | ORAL_CAPSULE | Freq: Two times a day (BID) | ORAL | Status: DC
  Administered 2018-11-22 – 2018-11-25 (×6): 100 mg via ORAL
  Filled 2018-11-22 (×6): qty 1

## 2018-11-22 MED ORDER — POVIDONE-IODINE 10 % EX SWAB: 2.0000 "application " | Freq: Once | CUTANEOUS | Status: DC

## 2018-11-22 MED ORDER — ACETAMINOPHEN 325 MG PO TABS
325.0000 mg | ORAL_TABLET | Freq: Four times a day (QID) | ORAL | Status: DC | PRN
  Administered 2018-11-23: 650 mg via ORAL
  Administered 2018-11-24 (×2): 325 mg via ORAL
  Administered 2018-11-24: 650 mg via ORAL
  Filled 2018-11-22 (×3): qty 2

## 2018-11-22 MED ORDER — PHENYLEPHRINE HCL-NACL 10-0.9 MG/250ML-% IV SOLN
INTRAVENOUS | Status: DC | PRN
  Administered 2018-11-22: 40 ug/min via INTRAVENOUS

## 2018-11-22 MED ORDER — CHLORHEXIDINE GLUCONATE 4 % EX LIQD: 60.0000 mL | Freq: Once | CUTANEOUS | Status: DC

## 2018-11-22 MED ORDER — DEXAMETHASONE SODIUM PHOSPHATE 10 MG/ML IJ SOLN
INTRAMUSCULAR | Status: DC | PRN
  Administered 2018-11-22: 8 mg via INTRAVENOUS

## 2018-11-22 MED ORDER — OXYCODONE HCL 5 MG/5ML PO SOLN: 5.0000 mg | Freq: Once | ORAL | Status: DC | PRN

## 2018-11-22 MED ORDER — HYDROCODONE-ACETAMINOPHEN 5-325 MG PO TABS
1.0000 | ORAL_TABLET | ORAL | Status: DC | PRN
  Administered 2018-11-22 – 2018-11-25 (×6): 1 via ORAL
  Filled 2018-11-22 (×8): qty 1

## 2018-11-22 MED ORDER — ONDANSETRON HCL 4 MG/2ML IJ SOLN
INTRAMUSCULAR | Status: DC | PRN
  Administered 2018-11-22: 4 mg via INTRAVENOUS

## 2018-11-22 MED ORDER — LIDOCAINE 2% (20 MG/ML) 5 ML SYRINGE
INTRAMUSCULAR | Status: AC
  Filled 2018-11-22: qty 5

## 2018-11-22 MED ORDER — SUGAMMADEX SODIUM 200 MG/2ML IV SOLN
INTRAVENOUS | Status: DC | PRN
  Administered 2018-11-22: 300 mg via INTRAVENOUS

## 2018-11-22 MED ORDER — PROPOFOL 10 MG/ML IV BOLUS
INTRAVENOUS | Status: DC | PRN
  Administered 2018-11-22: 120 mg via INTRAVENOUS

## 2018-11-22 MED ORDER — LACTATED RINGERS IV SOLN
INTRAVENOUS | Status: DC | PRN
  Administered 2018-11-22: 13:00:00 via INTRAVENOUS

## 2018-11-22 MED ORDER — METOCLOPRAMIDE HCL 5 MG PO TABS: 5.0000 mg | ORAL_TABLET | Freq: Three times a day (TID) | ORAL | Status: DC | PRN

## 2018-11-22 MED ORDER — CEFAZOLIN SODIUM-DEXTROSE 1-4 GM/50ML-% IV SOLN
1.0000 g | Freq: Four times a day (QID) | INTRAVENOUS | Status: AC
  Administered 2018-11-22 (×2): 1 g via INTRAVENOUS
  Filled 2018-11-22 (×2): qty 50

## 2018-11-22 MED ORDER — PHENYLEPHRINE 40 MCG/ML (10ML) SYRINGE FOR IV PUSH (FOR BLOOD PRESSURE SUPPORT)
PREFILLED_SYRINGE | INTRAVENOUS | Status: AC
  Filled 2018-11-22: qty 10

## 2018-11-22 MED ORDER — ACETAMINOPHEN 500 MG PO TABS
500.0000 mg | ORAL_TABLET | Freq: Four times a day (QID) | ORAL | Status: AC
  Administered 2018-11-22 – 2018-11-23 (×4): 500 mg via ORAL
  Filled 2018-11-22 (×4): qty 1

## 2018-11-22 MED ORDER — BISACODYL 10 MG RE SUPP: 10.0000 mg | Freq: Every day | RECTAL | Status: DC | PRN

## 2018-11-22 MED ORDER — METOCLOPRAMIDE HCL 5 MG/ML IJ SOLN: 5.0000 mg | Freq: Three times a day (TID) | INTRAMUSCULAR | Status: DC | PRN

## 2018-11-22 MED ORDER — CEFAZOLIN SODIUM-DEXTROSE 2-3 GM-%(50ML) IV SOLR
INTRAVENOUS | Status: DC | PRN
  Administered 2018-11-22: 2 g via INTRAVENOUS

## 2018-11-22 SURGICAL SUPPLY — 38 items
CLSR STERI-STRIP ANTIMIC 1/2X4 (GAUZE/BANDAGES/DRESSINGS) ×2 IMPLANT
COVER MAYO STAND STRL (DRAPES) ×2 IMPLANT
COVER PERINEAL POST (MISCELLANEOUS) ×2 IMPLANT
COVER SURGICAL LIGHT HANDLE (MISCELLANEOUS) ×2 IMPLANT
COVER WAND RF STERILE (DRAPES) ×2 IMPLANT
DRAPE STERI IOBAN 125X83 (DRAPES) ×2 IMPLANT
DRSG MEPILEX BORDER 4X4 (GAUZE/BANDAGES/DRESSINGS) ×4 IMPLANT
DURAPREP 26ML APPLICATOR (WOUND CARE) ×2 IMPLANT
ELECT REM PT RETURN 9FT ADLT (ELECTROSURGICAL) ×2
ELECTRODE REM PT RTRN 9FT ADLT (ELECTROSURGICAL) ×1 IMPLANT
GLOVE BIO SURGEON STRL SZ7.5 (GLOVE) ×4 IMPLANT
GLOVE BIOGEL PI IND STRL 8 (GLOVE) ×2 IMPLANT
GLOVE BIOGEL PI INDICATOR 8 (GLOVE) ×2
GOWN STRL REUS W/ TWL LRG LVL3 (GOWN DISPOSABLE) ×2 IMPLANT
GOWN STRL REUS W/TWL LRG LVL3 (GOWN DISPOSABLE) ×4
GUIDEROD T2 3X1000 (ROD) ×1 IMPLANT
K-WIRE  3.2X450M STR (WIRE) ×2
K-WIRE 3.2X450M STR (WIRE) ×2
KIT BASIN OR (CUSTOM PROCEDURE TRAY) ×2 IMPLANT
KIT NAIL LONG 10X340X125 (Nail) ×1 IMPLANT
KIT TURNOVER KIT B (KITS) ×2 IMPLANT
KWIRE 3.2X450M STR (WIRE) IMPLANT
MANIFOLD NEPTUNE II (INSTRUMENTS) ×2 IMPLANT
NS IRRIG 1000ML POUR BTL (IV SOLUTION) ×2 IMPLANT
PACK GENERAL/GYN (CUSTOM PROCEDURE TRAY) ×2 IMPLANT
PAD ARMBOARD 7.5X6 YLW CONV (MISCELLANEOUS) ×4 IMPLANT
SCREW LAG GAMMA 3 TI 10.5X100M (Screw) ×1 IMPLANT
SCREW SET GAMMA 3 (Screw) ×1 IMPLANT
STRIP CLOSURE SKIN 1/2X4 (GAUZE/BANDAGES/DRESSINGS) ×2 IMPLANT
SUT MNCRL AB 4-0 PS2 18 (SUTURE) ×2 IMPLANT
SUT MON AB 2-0 CT1 36 (SUTURE) ×1 IMPLANT
SUT VIC AB 0 CT1 27 (SUTURE) ×2
SUT VIC AB 0 CT1 27XBRD ANBCTR (SUTURE) IMPLANT
SUT VIC AB 2-0 CT1 27 (SUTURE) ×2
SUT VIC AB 2-0 CT1 TAPERPNT 27 (SUTURE) ×1 IMPLANT
TOWEL GREEN STERILE (TOWEL DISPOSABLE) ×2 IMPLANT
TOWEL OR NON WOVEN STRL DISP B (DISPOSABLE) ×2 IMPLANT
WATER STERILE IRR 1000ML POUR (IV SOLUTION) ×2 IMPLANT

## 2018-11-22 NOTE — Consult Note (Signed)
Reason for Consult:Right hip fx Referring Physician: Maricela Curet is an 85 y.o. female.  HPI: Ellen Barajas was raking some leaves and tripped as she was moving backwards. She fell into the pile of leaves but, even so, landed on her right side and had immediate pain and could not get up. She crawled back to the house where a neighbor saw her and got help. She was brought to the ED where x-rays showed a right hip fx and orthopedic surgery was consulted. She c/o pain to the area only. She lives alone.  Past Medical History:  Diagnosis Date  . AF (atrial fibrillation) (HCC)    coumadin  . Arthritis    "hands" (09/27/2017)  . Coronary artery disease 2002   s/p CABG  . Diverticulosis   . Dyslipidemia   . History of chicken pox   . IHD (ischemic heart disease)   . Myocardial infarction (HCC) ~ 2002  . Post herpetic neuralgia 02/2012    Past Surgical History:  Procedure Laterality Date  . BREAST LUMPECTOMY Left 1992   excision of fibrous tumor/notes 06/19/2000  . CARDIAC CATHETERIZATION  06/08/2000  . CATARACT EXTRACTION W/ INTRAOCULAR LENS  IMPLANT, BILATERAL Bilateral 2013  . CORONARY ARTERY BYPASS GRAFT  2002   x 4  . EYE SURGERY Bilateral 1989  . FEMUR IM NAIL Left 09/28/2017   Procedure: INTRAMEDULLARY (IM) NAIL FEMORAL;  Surgeon: Sheral Apley, MD;  Location: [facility] OR;  Service: Orthopedics;  Laterality: Left;    Family History  Problem Relation Age of Onset  . CAD Father 18       MI  . CAD Mother 25       MI    Social History:  reports that she has never smoked. She has never used smokeless tobacco. She reports that she does not drink alcohol or use drugs.  Allergies:  Allergies  Allergen Reactions  . Crestor [Rosuvastatin Calcium]     unknown  . Lipitor [Atorvastatin Calcium]     unknown  . Niaspan [Niacin Er]     unknown  . Other     PER PATIENT SHE DOES NOT TOLERATE NARCOTICS  . Pravastatin     unknown  . Ramipril [Ramipril]     Hyperkalemia  . Welchol  [Colesevelam Hcl]     unknown  . Zetia [Ezetimibe]     unknown    Medications: I have reviewed the patient's current medications.  Results for orders placed or performed during the hospital encounter of 11/21/18 (from the past 48 hour(s))  Type and screen [facility]     Status: None   Collection Time: 11/21/18  2:00 PM  Result Value Ref Range   ABO/RH(D) O POS    Antibody Screen NEG    Sample Expiration      11/24/2018,2359 Performed at [facility], [address], [place], [place] [address]   ABO/Rh     Status: None   Collection Time: 11/21/18  2:00 PM  Result Value Ref Range   ABO/RH(D)      O POS Performed at [facility], [address], [place], [place] [address]   Basic metabolic panel     Status: Abnormal   Collection Time: 11/21/18  2:03 PM  Result Value Ref Range   Sodium 140 135 - 145 mmol/L   Potassium 4.3 3.5 - 5.1 mmol/L   Chloride 106 98 - 111 mmol/L   CO2 21 (L) 22 -  32 mmol/L   Glucose, Bld 110 (H) 70 - 99 mg/dL   BUN 15 8 - 23 mg/dL   Creatinine, Ser 0.85 0.44 - 1.00 mg/dL   Calcium 9.5 8.9 - 10.3 mg/dL   GFR calc non Af Amer >60 >60 mL/min   GFR calc Af Amer >60 >60 mL/min   Anion gap 13 5 - 15    Comment: Performed at [facility] [address] [place], [place] [address]  CBC WITH DIFFERENTIAL     Status: Abnormal   Collection Time: 11/21/18  2:03 PM  Result Value Ref Range   WBC 7.7 4.0 - 10.5 K/uL   RBC 4.86 3.87 - 5.11 MIL/uL   Hemoglobin 14.6 12.0 - 15.0 g/dL   HCT 46.1 (H) 36.0 - 46.0 %   MCV 94.9 80.0 - 100.0 fL   MCH 30.0 26.0 - 34.0 pg   MCHC 31.7 30.0 - 36.0 g/dL   RDW 12.7 11.5 - 15.5 %   Platelets 229 150 - 400 K/uL   nRBC 0.0 0.0 - 0.2 %   Neutrophils Relative % 80 %   Neutro Abs 6.1 1.7 - 7.7 K/uL   Lymphocytes Relative 11 %   Lymphs Abs 0.8 0.7 - 4.0 K/uL   Monocytes Relative 6 %   Monocytes Absolute 0.5 0.1 - 1.0 K/uL   Eosinophils Relative 2 %   Eosinophils Absolute 0.2 0.0 - 0.5  K/uL   Basophils Relative 0 %   Basophils Absolute 0.0 0.0 - 0.1 K/uL   Immature Granulocytes 1 %   Abs Immature Granulocytes 0.05 0.00 - 0.07 K/uL    Comment: Performed at [facility] [address], [place], [place] [address]  Protime-INR     Status: None   Collection Time: 11/21/18  2:03 PM  Result Value Ref Range   Prothrombin Time 14.7 11.4 - 15.2 seconds   INR 1.2 0.8 - 1.2    Comment: (NOTE) INR goal varies based on device and disease states. Performed at [facility], [place] [address] [place], [place] [address]   SARS Coronavirus 2 by RT PCR (hospital order, performed in [facility] hospital lab) Nasopharyngeal Nasopharyngeal Swab     Status: None   Collection Time: 11/21/18  3:50 PM   Specimen: Nasopharyngeal Swab  Result Value Ref Range   SARS Coronavirus 2 NEGATIVE NEGATIVE    Comment: (NOTE) If result is NEGATIVE SARS-CoV-2 target nucleic acids are NOT DETECTED. The SARS-CoV-2 RNA is generally detectable in upper and lower  respiratory specimens during the acute phase of infection. The lowest  concentration of SARS-CoV-2 viral copies this assay can detect is 250  copies / mL. A negative result does not preclude SARS-CoV-2 infection  and should not be used as the sole basis for treatment or other  patient management decisions.  A negative result may occur with  improper specimen collection / handling, submission of specimen other  than nasopharyngeal swab, presence of viral mutation(s) within the  areas targeted by this assay, and inadequate number of viral copies  (<250 copies / mL). A negative result must be combined with clinical  observations, patient history, and epidemiological information. If result is POSITIVE SARS-CoV-2 target nucleic acids are DETECTED. The SARS-CoV-2 RNA is generally detectable in upper and lower  respiratory specimens dur ing the acute phase of infection.  Positive  results are indicative of active infection with SARS-CoV-2.   Clinical  correlation with patient history and other diagnostic information  is  necessary to determine patient infection status.  Positive results do  not rule out bacterial infection or co-infection with other viruses. If result is PRESUMPTIVE POSTIVE SARS-CoV-2 nucleic acids MAY BE PRESENT.   A presumptive positive result was obtained on the submitted specimen  and confirmed on repeat testing.  While 2019 novel coronavirus  (SARS-CoV-2) nucleic acids may be present in the submitted sample  additional confirmatory testing may be necessary for epidemiological  and / or clinical management purposes  to differentiate between  SARS-CoV-2 and other Sarbecovirus currently known to infect humans.  If clinically indicated additional testing with an alternate test  methodology [id number]) is advised. The SARS-CoV-2 RNA is generally  detectable in upper and lower respiratory sp ecimens during the acute  phase of infection. The expected result is Negative. Fact Sheet for Patients:  BoilerBrush.com.cy Fact Sheet for Healthcare Providers: https://pope.com/ This test is not yet approved or cleared by the [place] FDA and has been authorized for detection and/or diagnosis of SARS-CoV-2 by FDA under an Emergency Use Authorization (EUA).  This EUA will remain in effect (meaning this test can be used) for the duration of the COVID-19 declaration under Section 564(b)(1) of the Act, 21 U.S.C. section 360bbb-3(b)(1), unless the authorization is terminated or revoked sooner. Performed at [facility], [address] [place], [place] [address]   Basic metabolic panel     Status: Abnormal   Collection Time: 11/22/18  3:29 AM  Result Value Ref Range   Sodium 138 135 - 145 mmol/L   Potassium 3.9 3.5 - 5.1 mmol/L   Chloride 107 98 - 111 mmol/L   CO2 20 (L) 22 - 32 mmol/L   Glucose, Bld 93 70 - 99 mg/dL   BUN 16 8 - 23 mg/dL   Creatinine, Ser  8.00 0.44 - 1.00 mg/dL   Calcium 8.8 (L) 8.9 - 10.3 mg/dL   GFR calc non Af Amer >60 >60 mL/min   GFR calc Af Amer >60 >60 mL/min   Anion gap 11 5 - 15    Comment: Performed at [facility], [address] [place], [place] [address]  CBC     Status: None   Collection Time: 11/22/18  3:29 AM  Result Value Ref Range   WBC 8.4 4.0 - 10.5 K/uL   RBC 4.37 3.87 - 5.11 MIL/uL   Hemoglobin 13.3 12.0 - 15.0 g/dL   HCT 91.5 05.6 - 97.9 %   MCV 93.4 80.0 - 100.0 fL   MCH 30.4 26.0 - 34.0 pg   MCHC 32.6 30.0 - 36.0 g/dL   RDW 48.0 16.5 - 53.7 %   Platelets 197 150 - 400 K/uL   nRBC 0.0 0.0 - 0.2 %    Comment: Performed at [facility], [address], [place], [place] [address]    Dg Chest Port 1 View  Result Date: 11/21/2018 CLINICAL DATA:  Medical clearance, fall with hip pain. EXAM: PORTABLE CHEST 1 VIEW COMPARISON:  09/27/2017. FINDINGS: Trachea is midline. Heart is enlarged, stable. Thoracic aorta is calcified. Lungs are hyperinflated but clear. No pleural fluid. Osseous structures appear grossly intact. IMPRESSION: 1. Hyperinflation without acute finding. 2.  Aortic atherosclerosis (ICD10-170.0). Electronically Signed   By: Leanna Battles M.D.   On: 11/21/2018 14:39   Dg Hip Unilat With Pelvis 2-3 Views Right  Result Date: 11/21/2018 CLINICAL DATA:  Fall with hip pain shortening and rotation of the right hip EXAM: DG HIP (WITH OR  WITHOUT PELVIS) 2-3V RIGHT COMPARISON:  09/27/2017 FINDINGS: Prior intramedullary rodding of the left femur for prior intertrochanteric fracture. Safety pins over the medial upper thighs. Pubic symphysis appears intact. SI joints are non widened. Acute right intertrochanteric fracture. Femoral head position is normal. Possible fracture deformity of the right superior pubic ramus. IMPRESSION: 1. Acute right intertrochanteric fracture 2. Possible fracture deformity right superior pubic ramus 3. Prior intramedullary rodding of the left femur.  Electronically Signed   By: Jasmine PangKim  Fujinaga M.D.   On: 11/21/2018 14:40    Review of Systems  Constitutional: Negative for weight loss.  HENT: Negative for ear discharge, ear pain, hearing loss and tinnitus.   Eyes: Negative for blurred vision, double vision, photophobia and pain.  Respiratory: Negative for cough, sputum production and shortness of breath.   Cardiovascular: Negative for chest pain.  Gastrointestinal: Negative for abdominal pain, nausea and vomiting.  Genitourinary: Negative for dysuria, flank pain, frequency and urgency.  Musculoskeletal: Positive for joint pain (Right hip). Negative for back pain, falls, myalgias and neck pain.  Neurological: Negative for dizziness, tingling, sensory change, focal weakness, loss of consciousness and headaches.  Endo/Heme/Allergies: Does not bruise/bleed easily.  Psychiatric/Behavioral: Negative for depression, memory loss and substance abuse. The patient is not nervous/anxious.    Blood pressure (!) 170/61, pulse 84, temperature 98.7 F (37.1 C), temperature source Oral, resp. rate 14, height 5\' 3"  (1.6 m), weight 70.3 kg, SpO2 97 %. Physical Exam  Constitutional: She appears well-developed and well-nourished. No distress.  HENT:  Head: Normocephalic and atraumatic.  Eyes: Conjunctivae are normal. Right eye exhibits no discharge. Left eye exhibits no discharge. No scleral icterus.  Neck: Normal range of motion.  Cardiovascular: Normal rate and regular rhythm.  Respiratory: Effort normal. No respiratory distress.  Musculoskeletal:     Comments: RLE No traumatic wounds, ecchymosis, or rash  Mod hip TTP  No knee or ankle effusion  Knee stable to varus/ valgus and anterior/posterior stress  Sens DPN, SPN, TN intact  Motor EHL, ext, flex, evers 5/5  DP 2+, PT 1+, No significant edema  Neurological: She is alert.  Skin: Skin is warm and dry. She is not diaphoretic.  Psychiatric: She has a normal mood and affect. Her behavior is normal.     Assessment/Plan: Right hip fx -- For IMN today by Dr. Eulah PontMurphy. Please keep NPO. Multiple medical problems including hypertension, paroxysmal A. fib on Xarelto, coronary artery disease status post CABG -- per primary service    Freeman CaldronMichael J. Argel Pablo, PA-C Orthopedic Surgery [phone] 11/22/2018, 9:37 AM

## 2018-11-22 NOTE — Progress Notes (Addendum)
PROGRESS NOTE    Ellen Barajas  GHW:[id number] DOB: 03-Nov-1928 DOA: 11/21/2018 PCP: Eustaquio Boyden, MD  Brief Narrative: Ellen Barajas is a 85 y.o. female with medical history significant of hypertension, paroxysmal A. fib on Xarelto, coronary artery disease status post CABG presents to emergency department after a fall at home.  Patient reports that she was raking leaves 10/28 morning, she turned around, lost her balance and fell to the ground, subsequently was not able to ambulate and had right hip pain, imaging noted right hip intertrochanteric fracture.  Admitted, Xarelto held and orthopedics consulted for surgery   Assessment & Plan:   Acute right intertrochanteric fracture: -Following mechanical fall, orthopedic surgery consulting, plan for OR later today -History of CAD status post CABG, this was in 2002, patient reports good functional status without any symptoms, METs greater than 4 -In my opinion she is moderate cardiac risk for an intermediate risk procedure, continue atenolol perioperatively, no further cardiac work-up indicated at this time -restart Xarelto post op  Hypertension:  -Remains elevated, continue atenolol and losartan  Paroxysmal A. fib: Rate  -Rate controlled, continue atenolol, Xarelto on hold for surgery, resume this tomorrow if okay with orthopedics  DVT prophylaxis:  SCDs, Xarelto on hold, restart tomorrow Code Status: Full code  family Communication:  No family at bedside Disposition Plan:  Will likely need short-term rehab  Consultants:   Orthopedics Dr. Eulah Pont   Procedures:   Antimicrobials:    Subjective: -Reports mild discomfort in her right hip only with movement otherwise feels well, denies any chest pain dyspnea nausea vomiting or diarrhea Objective: Vitals:   11/21/18 2031 11/22/18 0224 11/22/18 0429 11/22/18 0727  BP: (!) 133/97  (!) 165/80 (!) 170/61  Pulse: 76  69 84  Resp:  14 19 14   Temp: 99 F (37.2 C)  98.8 F (37.1 C)  98.7 F (37.1 C)  TempSrc: Oral  Oral Oral  SpO2: 97%  98% 97%  Weight:      Height:        Intake/Output Summary (Last 24 hours) at 11/22/2018 1110 Last data filed at 11/22/2018 0900 Gross per 24 hour  Intake 0 ml  Output -  Net 0 ml   Filed Weights   11/21/18 1341  Weight: 70.3 kg    Examination:  General exam: Appears calm and comfortable pleasant female, AOx3, appears much younger than stated age Respiratory system: Clear Cardiovascular system: S1 & S2 heard, RRR.   Gastrointestinal system: Abdomen is nondistended, soft and nontender.Normal bowel sounds heard. Central nervous system: Alert and oriented. No focal neurological deficits. Extremities: No edema, right hip is shortened and externally rotated Skin: No rashes, lesions or ulcers Psychiatry: Judgement and insight appear normal. Mood & affect appropriate.     Data Reviewed:   CBC: Recent Labs  Lab 11/21/18 1403 11/22/18 0329  WBC 7.7 8.4  NEUTROABS 6.1  --   HGB 14.6 13.3  HCT 46.1* 40.8  MCV 94.9 93.4  PLT 229 197   Basic Metabolic Panel: Recent Labs  Lab 11/21/18 1403 11/22/18 0329  NA 140 138  K 4.3 3.9  CL 106 107  CO2 21* 20*  GLUCOSE 110* 93  BUN 15 16  CREATININE 0.85 0.82  CALCIUM 9.5 8.8*   GFR: Estimated Creatinine Clearance: 44.6 mL/min (by C-G formula based on SCr of 0.82 mg/dL). Liver Function Tests: No results for input(s): AST, ALT, ALKPHOS, BILITOT, PROT, ALBUMIN in the last 168 hours. No results for input(s): LIPASE, AMYLASE in  the last 168 hours. No results for input(s): AMMONIA in the last 168 hours. Coagulation Profile: Recent Labs  Lab 11/21/18 1403  INR 1.2   Cardiac Enzymes: No results for input(s): CKTOTAL, CKMB, CKMBINDEX, TROPONINI in the last 168 hours. BNP (last 3 results) No results for input(s): PROBNP in the last 8760 hours. HbA1C: No results for input(s): HGBA1C in the last 72 hours. CBG: No results for input(s): GLUCAP in the last 168 hours.  Lipid Profile: No results for input(s): CHOL, HDL, LDLCALC, TRIG, CHOLHDL, LDLDIRECT in the last 72 hours. Thyroid Function Tests: No results for input(s): TSH, T4TOTAL, FREET4, T3FREE, THYROIDAB in the last 72 hours. Anemia Panel: No results for input(s): VITAMINB12, FOLATE, FERRITIN, TIBC, IRON, RETICCTPCT in the last 72 hours. Urine analysis: No results found for: COLORURINE, APPEARANCEUR, LABSPEC, PHURINE, GLUCOSEU, HGBUR, BILIRUBINUR, KETONESUR, PROTEINUR, UROBILINOGEN, NITRITE, LEUKOCYTESUR Sepsis Labs: @LABRCNTIP (procalcitonin:4,lacticidven:4)  ) Recent Results (from the past 240 hour(s))  SARS Coronavirus 2 by RT PCR (hospital order, performed in [facility] hospital lab) Nasopharyngeal Nasopharyngeal Swab     Status: None   Collection Time: 11/21/18  3:50 PM   Specimen: Nasopharyngeal Swab  Result Value Ref Range Status   SARS Coronavirus 2 NEGATIVE NEGATIVE Final    Comment: (NOTE) If result is NEGATIVE SARS-CoV-2 target nucleic acids are NOT DETECTED. The SARS-CoV-2 RNA is generally detectable in upper and lower  respiratory specimens during the acute phase of infection. The lowest  concentration of SARS-CoV-2 viral copies this assay can detect is 250  copies / mL. A negative result does not preclude SARS-CoV-2 infection  and should not be used as the sole basis for treatment or other  patient management decisions.  A negative result may occur with  improper specimen collection / handling, submission of specimen other  than nasopharyngeal swab, presence of viral mutation(s) within the  areas targeted by this assay, and inadequate number of viral copies  (<250 copies / mL). A negative result must be combined with clinical  observations, patient history, and epidemiological information. If result is POSITIVE SARS-CoV-2 target nucleic acids are DETECTED. The SARS-CoV-2 RNA is generally detectable in upper and lower  respiratory specimens dur ing the acute phase of  infection.  Positive  results are indicative of active infection with SARS-CoV-2.  Clinical  correlation with patient history and other diagnostic information is  necessary to determine patient infection status.  Positive results do  not rule out bacterial infection or co-infection with other viruses. If result is PRESUMPTIVE POSTIVE SARS-CoV-2 nucleic acids MAY BE PRESENT.   A presumptive positive result was obtained on the submitted specimen  and confirmed on repeat testing.  While 2019 novel coronavirus  (SARS-CoV-2) nucleic acids may be present in the submitted sample  additional confirmatory testing may be necessary for epidemiological  and / or clinical management purposes  to differentiate between  SARS-CoV-2 and other Sarbecovirus currently known to infect humans.  If clinically indicated additional testing with an alternate test  methodology [phone]([id number]) is advised. The SARS-CoV-2 RNA is generally  detectable in upper and lower respiratory sp ecimens during the acute  phase of infection. The expected result is Negative. Fact Sheet for Patients:  BoilerBrush.com.cyhttps://www.fda.gov/media/136312/download Fact Sheet for Healthcare Providers: https://pope.com/https://www.fda.gov/media/136313/download This test is not yet approved or cleared by the [place] FDA and has been authorized for detection and/or diagnosis of SARS-CoV-2 by FDA under an Emergency Use Authorization (EUA).  This EUA will remain in effect (meaning this test can be used) for the duration  of the COVID-19 declaration under Section 564(b)(1) of the Act, 21 U.S.C. section 360bbb-3(b)(1), unless the authorization is terminated or revoked sooner. Performed at [facility], [place] [address], [place], [place] [address]   Surgical pcr screen     Status: None   Collection Time: 11/22/18  7:44 AM   Specimen: Nasal Mucosa; Nasal Swab  Result Value Ref Range Status   MRSA, PCR NEGATIVE NEGATIVE Final   Staphylococcus aureus NEGATIVE NEGATIVE  Final    Comment: (NOTE) The Xpert SA Assay (FDA approved for NASAL specimens in patients 36 years of age and older), is one component of a comprehensive surveillance program. It is not intended to diagnose infection nor to guide or monitor treatment. Performed at [facility], [place] [address] [place], [place] [address]          Radiology Studies: Dg Chest Port 1 View  Result Date: 11/21/2018 CLINICAL DATA:  Medical clearance, fall with hip pain. EXAM: PORTABLE CHEST 1 VIEW COMPARISON:  09/27/2017. FINDINGS: Trachea is midline. Heart is enlarged, stable. Thoracic aorta is calcified. Lungs are hyperinflated but clear. No pleural fluid. Osseous structures appear grossly intact. IMPRESSION: 1. Hyperinflation without acute finding. 2.  Aortic atherosclerosis (ICD10-170.0). Electronically Signed   By: Lorin Picket M.D.   On: 11/21/2018 14:39   Dg Hip Unilat With Pelvis 2-3 Views Right  Result Date: 11/21/2018 CLINICAL DATA:  Fall with hip pain shortening and rotation of the right hip EXAM: DG HIP (WITH OR WITHOUT PELVIS) 2-3V RIGHT COMPARISON:  09/27/2017 FINDINGS: Prior intramedullary rodding of the left femur for prior intertrochanteric fracture. Safety pins over the medial upper thighs. Pubic symphysis appears intact. SI joints are non widened. Acute right intertrochanteric fracture. Femoral head position is normal. Possible fracture deformity of the right superior pubic ramus. IMPRESSION: 1. Acute right intertrochanteric fracture 2. Possible fracture deformity right superior pubic ramus 3. Prior intramedullary rodding of the left femur. Electronically Signed   By: Donavan Foil M.D.   On: 11/21/2018 14:40        Scheduled Meds: . atenolol  25 mg Oral Daily  . chlorhexidine  60 mL Topical Once  . losartan  25 mg Oral Daily  . povidone-iodine  2 application Topical Once   Continuous Infusions:   LOS: 1 day    Time spent: 59min    Domenic Polite, MD [facility]  11/22/2018, 11:10 AM

## 2018-11-22 NOTE — Anesthesia Procedure Notes (Signed)
Procedure Name: Intubation Date/Time: 11/22/2018 1:58 PM Performed by: Orlie Dakin, CRNA Pre-anesthesia Checklist: Patient identified, Emergency Drugs available, Suction available and Patient being monitored Patient Re-evaluated:Patient Re-evaluated prior to induction Oxygen Delivery Method: Circle system utilized Preoxygenation: Pre-oxygenation with 100% oxygen Induction Type: IV induction Ventilation: Mask ventilation without difficulty Laryngoscope Size: Miller and 3 Grade View: Grade II Tube type: Oral Tube size: 7.0 mm Number of attempts: 2 Airway Equipment and Method: Stylet Placement Confirmation: positive ETCO2 and ETT inserted through vocal cords under direct vision Secured at: 23 cm Tube secured with: Tape Dental Injury: Teeth and Oropharynx as per pre-operative assessment  Comments: 1st DL esophageal intubation immediately recognized, 2nd DL as above. 4x4s bite block used.

## 2018-11-22 NOTE — Progress Notes (Signed)
Report given to [facility] in short stay at this time

## 2018-11-22 NOTE — Plan of Care (Signed)

## 2018-11-22 NOTE — Transfer of Care (Signed)
Immediate Anesthesia Transfer of Care Note  Patient: Ellen Barajas  Procedure(s) Performed: INTRAMEDULLARY (IM) NAIL INTERTROCHANTRIC (Right )  Patient Location: PACU  Anesthesia Type:General  Level of Consciousness: awake and patient cooperative  Airway & Oxygen Therapy: Patient Spontanous Breathing and Patient connected to face mask oxygen  Post-op Assessment: Report given to RN and Post -op Vital signs reviewed and stable  Post vital signs: Reviewed and stable  Last Vitals:  Vitals Value Taken Time  BP 167/77 11/22/18 1508  Temp    Pulse 75 11/22/18 1508  Resp 13 11/22/18 1508  SpO2 100 % 11/22/18 1508  Vitals shown include unvalidated device data.  Last Pain:  Vitals:   11/22/18 0727  TempSrc: Oral  PainSc:          Complications: No apparent anesthesia complications

## 2018-11-22 NOTE — Anesthesia Postprocedure Evaluation (Signed)
Anesthesia Post Note  Patient: Ellen Barajas  Procedure(s) Performed: INTRAMEDULLARY (IM) NAIL INTERTROCHANTRIC (Right )     Patient location during evaluation: PACU Anesthesia Type: General Level of consciousness: awake and alert and oriented Pain management: pain level controlled Vital Signs Assessment: post-procedure vital signs reviewed and stable Respiratory status: spontaneous breathing, nonlabored ventilation and respiratory function stable Cardiovascular status: blood pressure returned to baseline Postop Assessment: no apparent nausea or vomiting Anesthetic complications: no    Last Vitals:  Vitals:   11/22/18 1512 11/22/18 1526  BP:  (!) 169/77  Pulse: 77 77  Resp: 19 17  Temp: (!) 36.2 C (!) 36.1 C  SpO2: 100% 100%    Last Pain:  Vitals:   11/22/18 1526  TempSrc:   PainSc: 0-No pain                 Brennan Bailey

## 2018-11-22 NOTE — Anesthesia Preprocedure Evaluation (Addendum)
Anesthesia Evaluation  Patient identified by MRN, date of birth, ID band Patient awake    Reviewed: Allergy & Precautions, NPO status , Patient's Chart, lab work & pertinent test results  History of Anesthesia Complications Negative for: history of anesthetic complications  Airway Mallampati: II  TM Distance: >3 FB Neck ROM: Full    Dental no notable dental hx.    Pulmonary neg pulmonary ROS,    Pulmonary exam normal        Cardiovascular hypertension, + CAD, + Past MI and + CABG (2002)  + dysrhythmias (on Xarelto) Atrial Fibrillation  Rhythm:Irregular Rate:Normal + Systolic murmurs    Neuro/Psych negative neurological ROS  negative psych ROS   GI/Hepatic negative GI ROS, Neg liver ROS,   Endo/Other  negative endocrine ROS  Renal/GU negative Renal ROS  negative genitourinary   Musculoskeletal  (+) Arthritis , Right hip fx   Abdominal   Peds  Hematology negative hematology ROS (+)   Anesthesia Other Findings Day of surgery medications reviewed with patient.  Reproductive/Obstetrics negative OB ROS                           Anesthesia Physical Anesthesia Plan  ASA: III and emergent  Anesthesia Plan: General   Post-op Pain Management:    Induction: Intravenous  PONV Risk Score and Plan: 4 or greater and Treatment may vary due to age or medical condition and Ondansetron  Airway Management Planned: Oral ETT  Additional Equipment:   Intra-op Plan:   Post-operative Plan: Extubation in OR  Informed Consent: I have reviewed the patients History and Physical, chart, labs and discussed the procedure including the risks, benefits and alternatives for the proposed anesthesia with the patient or authorized representative who has indicated his/her understanding and acceptance.   Patient has DNR.  Discussed DNR with patient and Continue DNR.   Dental advisory given  Plan Discussed  with: CRNA  Anesthesia Plan Comments:       Anesthesia Quick Evaluation

## 2018-11-22 NOTE — Op Note (Signed)
DATE OF SURGERY:  11/22/2018  TIME: 2:35 PM  PATIENT NAME:  Ellen Barajas  AGE: 85 y.o.  PRE-OPERATIVE DIAGNOSIS:  intertrochanteric hip fracture  POST-OPERATIVE DIAGNOSIS:  SAME  PROCEDURE:  INTRAMEDULLARY (IM) NAIL INTERTROCHANTRIC  SURGEON:  Renette Butters  ASSISTANT:  Roxan Hockey, PA-C, he was present and scrubbed throughout the case, critical for completion in a timely fashion, and for retraction, instrumentation, and closure.   OPERATIVE IMPLANTS: [employer] Gamma Nail  PREOPERATIVE INDICATIONS:  [name] is a 85 y.o. year old who fell and suffered a hip fracture. She was brought into the ER and then admitted and optimized and then elected for surgical intervention.    The risks benefits and alternatives were discussed with the patient including but not limited to the risks of nonoperative treatment, versus surgical intervention including infection, bleeding, nerve injury, malunion, nonunion, hardware prominence, hardware failure, need for hardware removal, blood clots, cardiopulmonary complications, morbidity, mortality, among others, and they were willing to proceed.    OPERATIVE PROCEDURE:  The patient was brought to the operating room and placed in the supine position. General anesthesia was administered. She was placed on the fracture table.  Closed reduction was performed under C-arm guidance. Time out was then performed after sterile prep and drape. She received preoperative antibiotics.  Incision was made proximal to the greater trochanter. A guidewire was placed in the appropriate position. Confirmation was made on AP and lateral views. The above-named nail was opened. I opened the proximal femur with a reamer. I then placed the nail by hand easily down. I did not need to ream the femur.  Once the nail was completely seated, I placed a guidepin into the femoral head into the center center position. I measured the length, and then reamed the lateral cortex and up  into the head. I then placed the lag screw. Slight compression was applied. Anatomic fixation achieved. Bone quality was mediocre.  I then secured the proximal interlocking bolt, and took off a half a turn, and then removed the instruments, and took final C-arm pictures AP and lateral the entire length of the leg.   Anatomic reconstruction was achieved, and the wounds were irrigated copiously and closed with Vicryl followed by staples and sterile gauze for the skin. The patient was awakened and returned to PACU in stable and satisfactory condition. There no complications and the patient tolerated the procedure well.  She will be weightbearing as tolerated, and will be on chemical px  for a period of four weeks after discharge.   Edmonia Lynch, M.D.

## 2018-11-23 ENCOUNTER — Encounter (HOSPITAL_COMMUNITY): Payer: Self-pay | Admitting: Orthopedic Surgery

## 2018-11-23 DIAGNOSIS — S72141A Displaced intertrochanteric fracture of right femur, initial encounter for closed fracture: Principal | ICD-10-CM

## 2018-11-23 DIAGNOSIS — S72142A Displaced intertrochanteric fracture of left femur, initial encounter for closed fracture: Secondary | ICD-10-CM

## 2018-11-23 LAB — BASIC METABOLIC PANEL
Anion gap: 8 (ref 5–15)
BUN: 17 mg/dL (ref 8–23)
CO2: 23 mmol/L (ref 22–32)
Calcium: 8.7 mg/dL — ABNORMAL LOW (ref 8.9–10.3)
Chloride: 106 mmol/L (ref 98–111)
Creatinine, Ser: 0.88 mg/dL (ref 0.44–1.00)
GFR calc Af Amer: >60 mL/min (ref >60)
GFR calc non Af Amer: 59 mL/min — ABNORMAL LOW (ref >60)
Glucose, Bld: 185 mg/dL — ABNORMAL HIGH (ref 70–99)
Potassium: 4.4 mmol/L (ref 3.5–5.1)
Sodium: 137 mmol/L (ref 135–145)

## 2018-11-23 LAB — CBC
HCT: 40.4 % (ref 36.0–46.0)
Hemoglobin: 13.1 g/dL (ref 12.0–15.0)
MCH: 30 pg (ref 26.0–34.0)
MCHC: 32.4 g/dL (ref 30.0–36.0)
MCV: 92.4 fL (ref 80.0–100.0)
Platelets: 179 10*3/uL (ref 150–400)
RBC: 4.37 MIL/uL (ref 3.87–5.11)
RDW: 12.7 % (ref 11.5–15.5)
WBC: 9.2 10*3/uL (ref 4.0–10.5)
nRBC: 0 % (ref 0.0–0.2)

## 2018-11-23 NOTE — Evaluation (Signed)
Physical Therapy Evaluation Patient Details Name: Ellen Barajas MRN: [id number] DOB: 04/10/1928 Today's Date: 11/23/2018   History of Present Illness  85 yo female admitted to ED on 10/28 with fall and R intertrochanteric femur fracture, S/p R IM nailing on 10/29. PMH includes afib, CAD s/p CABG, HTN, OA, ischemic heart disease, L IM nail 2019.  Clinical Impression   Pt presents with RLE post-operative weakness, R hip pain, generalized weakness, unsteadiness in standing with LOB requiring PT assist to correct, difficulty with bed mobility/gait/transfers, and decreased activity tolerance due to weakness and pain. Pt to benefit from acute PT to address deficits. Pt ambulated short room distance with RW with mod assist for progression and stability of RLE, stabilizing RW. Pt is quite unsteady at this time, but is motivated to return to independence. Per pt's niece, pt will have 24/7 assist from a personal aide for 3 weeks post-acutely, and she will help pt as needed as well. PT recommending HHPT with 24/7 since pt and family refuse SNF level of care at this time. PT to progress mobility as tolerated, and will continue to follow acutely.      Follow Up Recommendations SNF;Supervision/Assistance - 24 hour(pt and family decline; d/c home with HHPT and 24/7 assist)    Equipment Recommendations  Rolling walker with 5" wheels;3in1 (PT)    Recommendations for Other Services       Precautions / Restrictions Precautions Precautions: Fall Restrictions Weight Bearing Restrictions: Yes RLE Weight Bearing: Weight bearing as tolerated      Mobility  Bed Mobility Overal bed mobility: Needs Assistance Bed Mobility: Sit to Supine       Sit to supine: Mod assist   General bed mobility comments: Pt up in chair upon PT arrival to room, mod assist for return to supine for RLE lifting and translation into bed. Pt able to perform hip bridge in supine to assist with pad placement.  Transfers Overall  transfer level: Needs assistance Equipment used: Rolling walker (2 wheeled) Transfers: Sit to/from Stand Sit to Stand: Mod assist         General transfer comment: mod assist for power up, stadying, verbal and tactile cuing for coming to full standing with hip extension. [facility] and [facility] from recliner.  Ambulation/Gait Ambulation/Gait assistance: Mod assist;+2 safety/equipment(pt's niece with chair follow/pt assist) Gait Distance (Feet): 7 Feet Assistive device: Rolling walker (2 wheeled) Gait Pattern/deviations: Step-to pattern;Decreased stance time - right;Decreased weight shift to right;Trunk flexed;Antalgic Gait velocity: decr   General Gait Details: Mod assist for steadying, R knee extension facilitation in stance phase as pt with RLE buckling in WB, R knee flexion facilitation in swing phase, guiding and holding back RW as pt tends to push RW too far in front of her. Verbal cuing for sequencing with RLE leading, offweighting RLE with UE use, weight shifting to progress gait.  Stairs            Wheelchair Mobility    Modified Rankin (Stroke Patients Only)       Balance Overall balance assessment: Needs assistance Sitting-balance support: Feet unsupported;No upper extremity supported Sitting balance-Leahy Scale: Good     Standing balance support: Bilateral upper extremity supported;During functional activity Standing balance-Leahy Scale: Poor Standing balance comment: reliant on external support in standing, 1 LOB requiring PT steadying assist to correct  Pertinent Vitals/Pain Pain Assessment: 0-10 Pain Score: 2  Pain Location: R LE during mobility Pain Descriptors / Indicators: Grimacing;Guarding;Sore Pain Intervention(s): Limited activity within patient's tolerance;Monitored during session;Repositioned;Ice applied;Patient requesting pain meds-RN notified    Home Living Family/patient expects to be  discharged to:: Private residence Living Arrangements: Alone;Other relatives Available Help at Discharge: Family;Available 24 hours/day;Personal care attendant Type of Home: House Home Access: Stairs to enter   [employer] of Steps: 3 Home Layout: One level Home Equipment: Grab bars - tub/shower      Prior Function Level of Independence: Independent   Gait / Transfers Assistance Needed: pt reports she uses no AD for mobility  ADL's / Homemaking Assistance Needed: pt reports that she was independent with ADLs/selfcare, home mgt, drives        Hand Dominance   Dominant Hand: Right    Extremity/Trunk Assessment   Upper Extremity Assessment Upper Extremity Assessment: Defer to OT evaluation    Lower Extremity Assessment Lower Extremity Assessment: Generalized weakness;RLE deficits/detail RLE Deficits / Details: post-surgical weakness RLE: Unable to fully assess due to pain    Cervical / Trunk Assessment Cervical / Trunk Assessment: Normal  Communication   Communication: HOH  Cognition Arousal/Alertness: Awake/alert Behavior During Therapy: WFL for tasks assessed/performed Overall Cognitive Status: Within Functional Limits for tasks assessed                                        General Comments      Exercises     Assessment/Plan    PT Assessment Patient needs continued PT services  PT Problem List Decreased strength;Decreased mobility;Decreased range of motion;Decreased activity tolerance;Decreased balance;Decreased knowledge of use of DME;Decreased safety awareness;Pain       PT Treatment Interventions DME instruction;Therapeutic activities;Gait training;Therapeutic exercise;Patient/family education;Balance training;Stair training;Functional mobility training    PT Goals (Current goals can be found in the Care Plan section)  Acute Rehab PT Goals Patient Stated Goal: go home PT Goal Formulation: With patient Time For Goal  Achievement: 12/07/18 Potential to Achieve Goals: Good    Frequency Min 5X/week   Barriers to discharge        Co-evaluation               AM-PAC PT "6 Clicks" Mobility  Outcome Measure Help needed turning from your back to your side while in a flat bed without using bedrails?: A Little Help needed moving from lying on your back to sitting on the side of a flat bed without using bedrails?: A Lot Help needed moving to and from a bed to a chair (including a wheelchair)?: A Lot Help needed standing up from a chair using your arms (e.g., wheelchair or bedside chair)?: A Lot Help needed to walk in hospital room?: A Lot Help needed climbing 3-5 steps with a railing? : Total 6 Click Score: 12    End of Session Equipment Utilized During Treatment: Gait belt Activity Tolerance: Patient limited by pain;Patient limited by fatigue Patient left: in bed;with call bell/phone within reach;with bed alarm set;with family/visitor present Nurse Communication: Mobility status PT Visit Diagnosis: Difficulty in walking, not elsewhere classified (R26.2);Unsteadiness on feet (R26.81);Muscle weakness (generalized) (M62.81);History of falling (Z91.81)    Time: 1345-1420 PT Time Calculation (min) (ACUTE ONLY): 35 min   Charges:   PT Evaluation $PT Eval Low Complexity: 1 Low PT Treatments $Gait Training: 8-22 mins  [place] Pager [phone]  Office [phone]  [place] 11/23/2018, 4:31 PM

## 2018-11-23 NOTE — Progress Notes (Signed)
    Subjective: Patient denies any pain.   Not yet mobilized, but highly motivated and independent.  Her goal is to discharge to home with support from her family and friends.  Objective:   VITALS:   Vitals:   11/22/18 1526 11/22/18 1958 11/23/18 0140 11/23/18 0332  BP: (!) 169/77 (!) 144/72 (!) 154/70 126/62  Pulse: 77 83 86 66  Resp: 17 18 16 17   Temp: (!) 97 F (36.1 C) 98.2 F (36.8 C) 98.4 F (36.9 C) 98.5 F (36.9 C)  TempSrc:  Oral Oral Oral  SpO2: 100% 96% 98% 98%  Weight:      Height:       CBC Latest Ref Rng & Units 11/23/2018 11/22/2018 11/21/2018  WBC 4.0 - 10.5 K/uL 9.2 8.4 7.7  Hemoglobin 12.0 - 15.0 g/dL 13.1 13.3 14.6  Hematocrit 36.0 - 46.0 % 40.4 40.8 46.1(H)  Platelets 150 - 400 K/uL 179 197 229   BMP Latest Ref Rng & Units 11/23/2018 11/22/2018 11/21/2018  Glucose 70 - 99 mg/dL 185(H) 93 110(H)  BUN 8 - 23 mg/dL 17 16 15   Creatinine 0.44 - 1.00 mg/dL 0.88 0.82 0.85  BUN/Creat Ratio 12 - 28 - - -  Sodium 135 - 145 mmol/L 137 138 140  Potassium 3.5 - 5.1 mmol/L 4.4 3.9 4.3  Chloride 98 - 111 mmol/L 106 107 106  CO2 22 - 32 mmol/L 23 20(L) 21(L)  Calcium 8.9 - 10.3 mg/dL 8.7(L) 8.8(L) 9.5   Intake/Output      10/29 0701 - 10/30 0700 10/30 0701 - 10/31 0700   P.O. 0    I.V. (mL/kg) 580 (8.3)    Total Intake(mL/kg) 580 (8.3)    Urine (mL/kg/hr) 300 (0.2)    Blood 100    Total Output 400    Net +180         Urine Occurrence 1 x        Physical Exam: General: NAD.  Upright in bed.  Calm, conversant.  No increased work of breathing. MSK RLE: Neurovascularly intact Sensation intact distally Feet warm Dorsiflexion/Plantar flexion intact Incision: dressing C/D/I   Assessment: 1 Day Post-Op  S/P Procedure(s) (LRB): INTRAMEDULLARY (IM) NAIL INTERTROCHANTRIC (Right) by Dr. Ernesta Amble. Percell Miller on 11/22/2018  Principal Problem:   Femur fracture, right ([facility]) Active Problems:   Essential hypertension   AF (paroxysmal atrial fibrillation)  ([facility])   Closed right intertrochanteric femur fracture, status post IM nail Doing very well postop day 1 Denies pain Not yet mobilized  Plan: Up with therapy Incentive Spirometry Apply ice PRN  Weightbearing: WBAT RLE Insicional and dressing care: Dressings left intact until follow-up Showering: Keep dressing dry VTE prophylaxis: Resume Xarelto (chronic medicine), SCDs, ambulation Pain control: Minimize narcotics.  Continue current regimen. Follow - up plan: 2 weeks Contact information:  Edmonia Lynch MD, Roxan Hockey PA-C  Dispo: TBD.  Therapy evaluations pending.  Patient lives alone but has good support from family.  Highly motivated - patient's goal is to be discharged to home.  Discharge when mobilized and ready medically.  Ellen Elizabeth Martensen III, PA-C 11/23/2018, 7:28 AM

## 2018-11-23 NOTE — Evaluation (Signed)
Occupational Therapy Evaluation Patient Details Name: Ellen Barajas MRN: [id number] DOB: 1928-10-02 Today's Date: 11/23/2018    History of Present Illness Ellen Barajas is a 85 y.o. year old who fell and suffered a hip fracture. She was brought into the ER and then admitted and optimized and then elected for surgical intervention.   Clinical Impression   Pt with decline in function and safety with ADLs and ADL mobility with impaired strength, balance and endurance. Pt with hx of falls and hip fx approximately a year ago. Pt reports that PTA, she lived at home alone and was independent with ADLs/selfcare, home mgt, was driving and used no AD for mobility. Pt states that she wants to return home and that her family will provide 24/7 assist, however uncertain that they will be able to safely provide level of physical assist pt requires at this time. Pt currently requires min A to sit EOB, max - mod A for sit - stand and SPTs with RW, max - total A with LB ADLs. Pt would benefit from acute OT services to address impairments to maximize level of function and safety    Follow Up Recommendations  SNF(may be able to have HH if progressing well with acute therapy)    Equipment Recommendations  3 in 1 bedside commode    Recommendations for Other Services       Precautions / Restrictions Precautions Precautions: Fall Restrictions Weight Bearing Restrictions: Yes RLE Weight Bearing: Weight bearing as tolerated      Mobility Bed Mobility Overal bed mobility: Needs Assistance Bed Mobility: Supine to Sit     Supine to sit: Min assist     General bed mobility comments: min A to elevate trunk, pt able to bring LEs to EOB with only light support of R LE  Transfers Overall transfer level: Needs assistance Equipment used: Rolling walker (2 wheeled) Transfers: Sit to/from [employer] Sit to Stand: Max assist Stand pivot transfers: Mod assist       General transfer comment:  max A initially, mod verbal cues for sequencing. Mod A for SPT    Balance Overall balance assessment: Needs assistance Sitting-balance support: Feet unsupported;No upper extremity supported Sitting balance-Leahy Scale: Good     Standing balance support: Bilateral upper extremity supported;During functional activity Standing balance-Leahy Scale: Poor                             ADL either performed or assessed with clinical judgement   ADL Overall ADL's : Needs assistance/impaired Eating/Feeding: Independent;Sitting   Grooming: Wash/dry hands;Wash/dry face;Sitting;Set up;Supervision/safety   Upper Body Bathing: Set up;Supervision/ safety;Sitting   Lower Body Bathing: Maximal assistance   Upper Body Dressing : Supervision/safety;Set up;Sitting   Lower Body Dressing: Total assistance   Toilet Transfer: Maximal assistance;Moderate assistance;RW;Stand-pivot;BSC;Cueing for safety;Cueing for sequencing   Toileting- Clothing Manipulation and Hygiene: Total assistance       Functional mobility during ADLs: Maximal assistance;Moderate assistance;Rolling walker;Cueing for safety;Cueing for sequencing       Vision Baseline Vision/History: Wears glasses Patient Visual Report: No change from baseline       Perception     Praxis      Pertinent Vitals/Pain Pain Assessment: Faces Faces Pain Scale: Hurts even more Pain Location: R LE during mobility Pain Descriptors / Indicators: Grimacing;Guarding;Sore Pain Intervention(s): Limited activity within patient's tolerance;Premedicated before session;Monitored during session;Repositioned     Hand Dominance Right   Extremity/Trunk Assessment Upper Extremity  Assessment Upper Extremity Assessment: Generalized weakness   Lower Extremity Assessment Lower Extremity Assessment: Defer to PT evaluation       Communication Communication Communication: No difficulties   Cognition Arousal/Alertness: Awake/alert Behavior  During Therapy: WFL for tasks assessed/performed Overall Cognitive Status: Within Functional Limits for tasks assessed                                     General Comments       Exercises     Shoulder Instructions      Home Living Family/patient expects to be discharged to:: Private residence Living Arrangements: Alone;Other relatives Available Help at Discharge: Family;Available 24 hours/day Type of Home: House Home Access: Stairs to enter [employer] Corporation[employer]trance Stairs-Number of Steps: 3   Home Layout: One level     Bathroom Shower/Tub: Chief Strategy OfficerTub/shower unit   Bathroom Toilet: Standard     Home Equipment: Grab bars - tub/shower          Prior Functioning/Environment Level of Independence: Independent  Gait / Transfers Assistance Needed: pt reports she uses no AD for mobility ADL's / Homemaking Assistance Needed: pt reports that she was independent with ADLs/selfcare, home mgt, drives            OT Problem List: Decreased strength;Impaired balance (sitting and/or standing);Pain;Decreased activity tolerance;Decreased knowledge of use of DME or AE      OT Treatment/Interventions: Self-care/ADL training;DME and/or AE instruction;Therapeutic activities;Patient/family education    OT Goals(Current goals can be found in the care plan section) Acute Rehab OT Goals Patient Stated Goal: go home OT Goal Formulation: With patient Time For Goal Achievement: 12/07/18 ADL Goals Pt Will Perform Grooming: with min assist;with min guard assist;standing Pt Will Perform Lower Body Bathing: with mod assist;sitting/lateral leans;sit to/from stand Pt Will Perform Lower Body Dressing: with max assist;with mod assist;sitting/lateral leans;sit to/from stand Pt Will Transfer to Toilet: with mod assist;with min assist;ambulating;stand pivot transfer;bedside commode Pt Will Perform Toileting - Clothing Manipulation and hygiene: with max assist;with mod assist;sit to/from stand Additional ADL  Goal #1: pt will complete bed mobility with mim guard A - sup to sit EOB for ADLs/selfcare tasks  OT Frequency: Min 2X/week   Barriers to D/C: Decreased caregiver support  pt sttaes that family can assist her 24/7, but uncertain if they can provide level of physical assist required at this time       Co-evaluation              AM-PAC OT "6 Clicks" Daily Activity     Outcome Measure Help from another person eating meals?: None Help from another person taking care of personal grooming?: A Little Help from another person toileting, which includes using toliet, bedpan, or urinal?: Total Help from another person bathing (including washing, rinsing, drying)?: Total Help from another person to put on and taking off regular upper body clothing?: A Little Help from another person to put on and taking off regular lower body clothing?: Total 6 Click Score: 13   End of Session Equipment Utilized During Treatment: Gait belt;Rolling walker;Other (comment)(BSC)  Activity Tolerance: Patient tolerated treatment well Patient left: in chair;with call bell/phone within reach;with chair alarm set;with nursing/sitter in room  OT Visit Diagnosis: Unsteadiness on feet (R26.81);Other abnormalities of gait and mobility (R26.89);Muscle weakness (generalized) (M62.81);History of falling (Z91.81);Pain Pain - Right/Left: Right Pain - part of body: Hip;Leg  Time: 5102-5852 OT Time Calculation (min): 44 min Charges:  OT Evaluation $OT Eval Moderate Complexity: 1 Mod   Britt [place] 11/23/2018, 12:28 PM

## 2018-11-23 NOTE — Progress Notes (Signed)
PROGRESS NOTE    Ellen Barajas  JXB:[id number] DOB: Oct 13, 1928 DOA: 11/21/2018 PCP: Ria Bush, MD  Brief Narrative: Ellen Barajas is a 86 y.o. female with medical history significant of hypertension, paroxysmal A. fib on Xarelto, coronary artery disease status post CABG presents to emergency department after a fall at home.  Patient reports that she was raking leaves 10/28 morning, she turned around, lost her balance and fell to the ground, subsequently was not able to ambulate and had right hip pain, imaging noted right hip intertrochanteric fracture.  Admitted, Xarelto held and orthopedics consulted for surgery -Underwent intramedullary nail by Dr. Percell Miller on 10/29  Assessment & Plan:   Acute right intertrochanteric fracture: -Following mechanical fall, orthopedic surgery following -Underwent intertrochanteric intramedullary nail placement 10/29 by Dr. Percell Miller -Stable postoperatively, restart Xarelto today -PT OT -Patient hopes to return home with family assistance and home health services  Hypertension:  -Remains elevated, continue atenolol and losartan  Paroxysmal A. fib: Rate  -Rate controlled, continue atenolol, restart Xarelto  DVT prophylaxis:   Xarelto  Code Status: Full code  family Communication:  No family at bedside, called and updated niece Fuller [place] disposition Plan:  Home with home health services, patient declined short-term rehab, possibly discharge on Sunday if stable  Consultants:   Orthopedics Dr. Percell Miller   Procedures: Intramedullary nail 10/29  Antimicrobials:    Subjective: -Mild discomfort in the right hip but otherwise doing well, denies any chest pain shortness of breath nausea vomiting  Objective: Vitals:   11/22/18 1958 11/23/18 0140 11/23/18 0332 11/23/18 0745  BP: (!) 144/72 (!) 154/70 126/62 (!) 150/77  Pulse: 83 86 66 68  Resp: 18 16 17 18   Temp: 98.2 F (36.8 C) 98.4 F (36.9 C) 98.5 F (36.9 C) 98 F (36.7 C)  TempSrc:  Oral Oral Oral Oral  SpO2: 96% 98% 98% 99%  Weight:      Height:        Intake/Output Summary (Last 24 hours) at 11/23/2018 1237 Last data filed at 11/23/2018 0900 Gross per 24 hour  Intake 820 ml  Output 600 ml  Net 220 ml   Filed Weights   11/21/18 1341 11/22/18 1145  Weight: 70.3 kg 70.3 kg    Examination:  Gen: Awake, Alert, Oriented X 3, no distress  HEENT: PERRLA, Neck supple, no JVD Lungs: Clear CVS: RRR,No Gallops,Rubs or new Murmurs Abd: soft, Non tender, non distended, BS present Extremities: No edema, right hip with dressing Skin: no new rashes Psychiatry: Judgement and insight appear normal. Mood & affect appropriate.     Data Reviewed:   CBC: Recent Labs  Lab 11/21/18 1403 11/22/18 0329 11/23/18 0445  WBC 7.7 8.4 9.2  NEUTROABS 6.1  --   --   HGB 14.6 13.3 13.1  HCT 46.1* 40.8 40.4  MCV 94.9 93.4 92.4  PLT 229 197 130   Basic Metabolic Panel: Recent Labs  Lab 11/21/18 1403 11/22/18 0329 11/23/18 0445  NA 140 138 137  K 4.3 3.9 4.4  CL 106 107 106  CO2 21* 20* 23  GLUCOSE 110* 93 185*  BUN 15 16 17   CREATININE 0.85 0.82 0.88  CALCIUM 9.5 8.8* 8.7*   GFR: Estimated Creatinine Clearance: 41.6 mL/min (by C-G formula based on SCr of 0.88 mg/dL). Liver Function Tests: No results for input(s): AST, ALT, ALKPHOS, BILITOT, PROT, ALBUMIN in the last 168 hours. No results for input(s): LIPASE, AMYLASE in the last 168 hours. No results for input(s): AMMONIA in the  last 168 hours. Coagulation Profile: Recent Labs  Lab 11/21/18 1403  INR 1.2   Cardiac Enzymes: No results for input(s): CKTOTAL, CKMB, CKMBINDEX, TROPONINI in the last 168 hours. BNP (last 3 results) No results for input(s): PROBNP in the last 8760 hours. HbA1C: No results for input(s): HGBA1C in the last 72 hours. CBG: No results for input(s): GLUCAP in the last 168 hours. Lipid Profile: No results for input(s): CHOL, HDL, LDLCALC, TRIG, CHOLHDL, LDLDIRECT in the last 72  hours. Thyroid Function Tests: No results for input(s): TSH, T4TOTAL, FREET4, T3FREE, THYROIDAB in the last 72 hours. Anemia Panel: No results for input(s): VITAMINB12, FOLATE, FERRITIN, TIBC, IRON, RETICCTPCT in the last 72 hours. Urine analysis: No results found for: COLORURINE, APPEARANCEUR, LABSPEC, PHURINE, GLUCOSEU, HGBUR, BILIRUBINUR, KETONESUR, PROTEINUR, UROBILINOGEN, NITRITE, LEUKOCYTESUR Sepsis Labs: @LABRCNTIP (procalcitonin:4,lacticidven:4)  ) Recent Results (from the past 240 hour(s))  SARS Coronavirus 2 by RT PCR (hospital order, [facility]) Nasopharyngeal Nasopharyngeal Swab     Status: None   Collection Time: 11/21/18  3:50 PM   Specimen: Nasopharyngeal Swab  Result Value Ref Range Status   SARS Coronavirus 2 NEGATIVE NEGATIVE Final    Comment: (NOTE) If result is NEGATIVE SARS-CoV-2 target nucleic acids are NOT DETECTED. The SARS-CoV-2 RNA is generally detectable in upper and lower  respiratory specimens during the acute phase of infection. The lowest  concentration of SARS-CoV-2 viral copies this assay can detect is 250  copies / mL. A negative result does not preclude SARS-CoV-2 infection  and should not be used as the sole basis for treatment or other  patient management decisions.  A negative result may occur with  improper specimen collection / handling, submission of specimen other  than nasopharyngeal swab, presence of viral mutation(s) within the  areas targeted by this assay, and inadequate number of viral copies  (<250 copies / mL). A negative result must be combined with clinical  observations, patient history, and epidemiological information. If result is POSITIVE SARS-CoV-2 target nucleic acids are DETECTED. The SARS-CoV-2 RNA is generally detectable in upper and lower  respiratory specimens dur ing the acute phase of infection.  Positive  results are indicative of active infection with SARS-CoV-2.  Clinical  correlation  with patient history and other diagnostic information is  necessary to determine patient infection status.  Positive results do  not rule out bacterial infection or co-infection with other viruses. If result is PRESUMPTIVE POSTIVE SARS-CoV-2 nucleic acids MAY BE PRESENT.   A presumptive positive result was obtained on the submitted specimen  and confirmed on repeat testing.  While 2019 novel coronavirus  (SARS-CoV-2) nucleic acids may be present in the submitted sample  additional confirmatory testing may be necessary for epidemiological  and / or clinical management purposes  to differentiate between  SARS-CoV-2 and other Sarbecovirus currently known to infect humans.  If clinically indicated additional testing with an alternate test  methodology [phone]([id number]) is advised. The SARS-CoV-2 RNA is generally  detectable in upper and lower respiratory sp ecimens during the acute  phase of infection. The expected result is Negative. Fact Sheet for Patients:  BoilerBrush.com.cyhttps://www.fda.gov/media/136312/download Fact Sheet for Healthcare Providers: https://pope.com/https://www.fda.gov/media/136313/download This test is not yet approved or cleared by the [place] FDA and has been authorized for detection and/or diagnosis of SARS-CoV-2 by FDA under an Emergency Use Authorization (EUA).  This EUA will remain in effect (meaning this test can be used) for the duration of the COVID-19 declaration under Section 564(b)(1) of the Act, 21  U.S.C. section 360bbb-3(b)(1), unless the authorization is terminated or revoked sooner. Performed at [facility], [address], [place], [place] [address]   Surgical pcr screen     Status: None   Collection Time: 11/22/18  7:44 AM   Specimen: Nasal Mucosa; Nasal Swab  Result Value Ref Range Status   MRSA, PCR NEGATIVE NEGATIVE Final   Staphylococcus aureus NEGATIVE NEGATIVE Final    Comment: (NOTE) The Xpert SA Assay (FDA approved for NASAL specimens in patients 30 years of age  and older), is one component of a comprehensive surveillance program. It is not intended to diagnose infection nor to guide or monitor treatment. Performed at [facility], [address], [place], [place] [address]          Radiology Studies: Dg Chest Port 1 View  Result Date: 11/21/2018 CLINICAL DATA:  Medical clearance, fall with hip pain. EXAM: PORTABLE CHEST 1 VIEW COMPARISON:  09/27/2017. FINDINGS: Trachea is midline. Heart is enlarged, stable. Thoracic aorta is calcified. Lungs are hyperinflated but clear. No pleural fluid. Osseous structures appear grossly intact. IMPRESSION: 1. Hyperinflation without acute finding. 2.  Aortic atherosclerosis (ICD10-170.0). Electronically Signed   By: Leanna Battles M.D.   On: 11/21/2018 14:39   Dg C-arm 1-60 Min  Result Date: 11/22/2018 CLINICAL DATA:  Intraoperative localization for ORIF of right hip fracture. EXAM: OPERATIVE RIGHT HIP (WITH PELVIS IF PERFORMED)  VIEWS TECHNIQUE: Fluoroscopic spot image(s) were submitted for interpretation post-operatively. COMPARISON:  Prior radiograph from 11/21/2018 FINDINGS: Multiple spot intraoperative fluoroscopic views from ORIF of a intertrochanteric right hip fracture are seen. Views demonstrate placement of an IM fixation nail with proximal interlocking screw. Hardware appears well positioned without adverse features. Right hip fracture in gross anatomic alignment. No complication. Fluoroscopy time equals 39 seconds.  Three spot images obtained. IMPRESSION: Intraoperative localization for ORIF of intertrochanteric right hip fracture. No complication. Electronically Signed   By: Rise Mu M.D.   On: 11/22/2018 14:59   Dg Hip Operative Unilat W Or W/o Pelvis Left  Result Date: 11/22/2018 CLINICAL DATA:  Intraoperative localization for ORIF of right hip fracture. EXAM: OPERATIVE RIGHT HIP (WITH PELVIS IF PERFORMED)  VIEWS TECHNIQUE: Fluoroscopic spot image(s) were submitted for  interpretation post-operatively. COMPARISON:  Prior radiograph from 11/21/2018 FINDINGS: Multiple spot intraoperative fluoroscopic views from ORIF of a intertrochanteric right hip fracture are seen. Views demonstrate placement of an IM fixation nail with proximal interlocking screw. Hardware appears well positioned without adverse features. Right hip fracture in gross anatomic alignment. No complication. Fluoroscopy time equals 39 seconds.  Three spot images obtained. IMPRESSION: Intraoperative localization for ORIF of intertrochanteric right hip fracture. No complication. Electronically Signed   By: Rise Mu M.D.   On: 11/22/2018 14:59   Dg Hip Unilat With Pelvis 2-3 Views Right  Result Date: 11/21/2018 CLINICAL DATA:  Fall with hip pain shortening and rotation of the right hip EXAM: DG HIP (WITH OR WITHOUT PELVIS) 2-3V RIGHT COMPARISON:  09/27/2017 FINDINGS: Prior intramedullary rodding of the left femur for prior intertrochanteric fracture. Safety pins over the medial upper thighs. Pubic symphysis appears intact. SI joints are non widened. Acute right intertrochanteric fracture. Femoral head position is normal. Possible fracture deformity of the right superior pubic ramus. IMPRESSION: 1. Acute right intertrochanteric fracture 2. Possible fracture deformity right superior pubic ramus 3. Prior intramedullary rodding of the left femur. Electronically Signed   By: Jasmine Pang M.D.   On: 11/21/2018 14:40  Scheduled Meds:  acetaminophen  500 mg Oral Q6H   atenolol  25 mg Oral Daily   docusate sodium  100 mg Oral BID   losartan  25 mg Oral Daily   Rivaroxaban  15 mg Oral Q supper   Continuous Infusions:   LOS: 2 days    Time spent:    Zannie Cove, MD [facility]  11/23/2018, 12:37 PM

## 2018-11-23 NOTE — Plan of Care (Signed)
  Problem: Activity: Goal: Risk for activity intolerance will decrease Outcome: Progressing   Problem: Coping: Goal: Level of anxiety will decrease Outcome: Progressing   Problem: Elimination: Goal: Will not experience complications related to bowel motility Outcome: Progressing Goal: Will not experience complications related to urinary retention Outcome: Progressing   Problem: Elimination: Goal: Will not experience complications related to urinary retention Outcome: Progressing   Problem: Pain Managment: Goal: General experience of comfort will improve Outcome: Progressing   Problem: Safety: Goal: Ability to remain free from injury will improve Outcome: Progressing   Problem: Skin Integrity: Goal: Risk for impaired skin integrity will decrease Outcome: Progressing   

## 2018-11-24 LAB — BASIC METABOLIC PANEL
Anion gap: 11 (ref 5–15)
BUN: 22 mg/dL (ref 8–23)
CO2: 22 mmol/L (ref 22–32)
Calcium: 8.5 mg/dL — ABNORMAL LOW (ref 8.9–10.3)
Chloride: 105 mmol/L (ref 98–111)
Creatinine, Ser: 0.82 mg/dL (ref 0.44–1.00)
GFR calc Af Amer: >60 mL/min (ref >60)
GFR calc non Af Amer: >60 mL/min (ref >60)
Glucose, Bld: 140 mg/dL — ABNORMAL HIGH (ref 70–99)
Potassium: 4.5 mmol/L (ref 3.5–5.1)
Sodium: 138 mmol/L (ref 135–145)

## 2018-11-24 LAB — CBC
HCT: 39.8 % (ref 36.0–46.0)
Hemoglobin: 12.7 g/dL (ref 12.0–15.0)
MCH: 30.1 pg (ref 26.0–34.0)
MCHC: 31.9 g/dL (ref 30.0–36.0)
MCV: 94.3 fL (ref 80.0–100.0)
Platelets: 165 10*3/uL (ref 150–400)
RBC: 4.22 MIL/uL (ref 3.87–5.11)
RDW: 13 % (ref 11.5–15.5)
WBC: 10.8 10*3/uL — ABNORMAL HIGH (ref 4.0–10.5)
nRBC: 0 % (ref 0.0–0.2)

## 2018-11-24 NOTE — Plan of Care (Signed)
  Problem: Activity: Goal: Risk for activity intolerance will decrease Outcome: Progressing   Problem: Elimination: Goal: Will not experience complications related to bowel motility Outcome: Progressing Goal: Will not experience complications related to urinary retention Outcome: Progressing   Problem: Pain Managment: Goal: General experience of comfort will improve Outcome: Progressing   Problem: Skin Integrity: Goal: Risk for impaired skin integrity will decrease Outcome: Progressing   

## 2018-11-24 NOTE — Progress Notes (Signed)
PROGRESS NOTE    Ellen Barajas  ZOX:[id number] DOB: 11/18/1928 DOA: 11/21/2018 PCP: Eustaquio BoydenGutierrez, Javier, MD  Brief Narrative: Ellen Dialsrma D Domangue is a 85 y.o. female with medical history significant of hypertension, paroxysmal A. fib on Xarelto, coronary artery disease status post CABG presents to emergency department after a fall at home.  Patient reports that she was raking leaves 10/28 morning, she turned around, lost her balance and fell to the ground, subsequently was not able to ambulate and had right hip pain, imaging noted right hip intertrochanteric fracture.  Admitted, Xarelto held and orthopedics consulted for surgery -Underwent intramedullary nail by Dr. Eulah PontMurphy on 10/29  Assessment & Plan:   Acute right intertrochanteric fracture: -Following mechanical fall, orthopedic surgery following -Underwent intertrochanteric intramedullary nail placement 10/29 by Dr. Eulah PontMurphy -Stable postop, restarted Xarelto -Continue PT OT -Patient declines SNF for rehab, she hopes to go home with home health services and family assistance, will plan discharge tomorrow  Hypertension:  -Stable, continue atenolol and losartan  Paroxysmal A. fib: Rate  -Rate controlled, continue atenolol,  Xarelto  DVT prophylaxis:   Xarelto  Code Status: Full code  family Communication:  Niece Ashley MurrainCarla Chambers side  disposition Plan:  Home with home health services, patient declined short-term rehab, plan for discharge home tomorrow if stable  Consultants:   Orthopedics Dr. Eulah PontMurphy   Procedures: Intramedullary nail 10/29  Antimicrobials:    Subjective: -Feels okay, mild discomfort when she is had to sit on the bedpan  Objective: Vitals:   11/23/18 2009 11/24/18 0329 11/24/18 0840 11/24/18 1227  BP: 140/87 (!) 156/63 (!) 130/59 (!) 147/63  Pulse: 69 81 95 71  Resp:   18 18  Temp: 98.2 F (36.8 C) 98.4 F (36.9 C) 98.7 F (37.1 C) 97.7 F (36.5 C)  TempSrc: Oral Oral Oral Oral  SpO2: 96% 97% 98% 99%  Weight:       Height:        Intake/Output Summary (Last 24 hours) at 11/24/2018 1243 Last data filed at 11/24/2018 0900 Gross per 24 hour  Intake 720 ml  Output 600 ml  Net 120 ml   Filed Weights   11/21/18 1341 11/22/18 1145  Weight: 70.3 kg 70.3 kg    Examination:  Gen: Awake, Alert, Oriented X 3, no distress HEENT: PERRLA, Neck supple, no JVD Lungs: Good air movement bilaterally, CTAB CVS: RRR,No Gallops,Rubs or new Murmurs Abd: soft, Non tender, non distended, BS present Extremities: No edema, right hip with dressing Skin: no new rashes Psychiatry: Judgement and insight appear normal. Mood & affect appropriate.     Data Reviewed:   CBC: Recent Labs  Lab 11/21/18 1403 11/22/18 0329 11/23/18 0445 11/24/18 0806  WBC 7.7 8.4 9.2 10.8*  NEUTROABS 6.1  --   --   --   HGB 14.6 13.3 13.1 12.7  HCT 46.1* 40.8 40.4 39.8  MCV 94.9 93.4 92.4 94.3  PLT 229 197 179 165   Basic Metabolic Panel: Recent Labs  Lab 11/21/18 1403 11/22/18 0329 11/23/18 0445 11/24/18 0806  NA 140 138 137 138  K 4.3 3.9 4.4 4.5  CL 106 107 106 105  CO2 21* 20* 23 22  GLUCOSE 110* 93 185* 140*  BUN 15 16 17 22   CREATININE 0.85 0.82 0.88 0.82  CALCIUM 9.5 8.8* 8.7* 8.5*   GFR: Estimated Creatinine Clearance: 44.6 mL/min (by C-G formula based on SCr of 0.82 mg/dL). Liver Function Tests: No results for input(s): AST, ALT, ALKPHOS, BILITOT, PROT, ALBUMIN in the  last 168 hours. No results for input(s): LIPASE, AMYLASE in the last 168 hours. No results for input(s): AMMONIA in the last 168 hours. Coagulation Profile: Recent Labs  Lab 11/21/18 1403  INR 1.2   Cardiac Enzymes: No results for input(s): CKTOTAL, CKMB, CKMBINDEX, TROPONINI in the last 168 hours. BNP (last 3 results) No results for input(s): PROBNP in the last 8760 hours. HbA1C: No results for input(s): HGBA1C in the last 72 hours. CBG: No results for input(s): GLUCAP in the last 168 hours. Lipid Profile: No results for  input(s): CHOL, HDL, LDLCALC, TRIG, CHOLHDL, LDLDIRECT in the last 72 hours. Thyroid Function Tests: No results for input(s): TSH, T4TOTAL, FREET4, T3FREE, THYROIDAB in the last 72 hours. Anemia Panel: No results for input(s): VITAMINB12, FOLATE, FERRITIN, TIBC, IRON, RETICCTPCT in the last 72 hours. Urine analysis: No results found for: COLORURINE, APPEARANCEUR, LABSPEC, PHURINE, GLUCOSEU, HGBUR, BILIRUBINUR, KETONESUR, PROTEINUR, UROBILINOGEN, NITRITE, LEUKOCYTESUR Sepsis Labs: @LABRCNTIP (procalcitonin:4,lacticidven:4)  ) Recent Results (from the past 240 hour(s))  SARS Coronavirus 2 by RT PCR (hospital order, performed in [facility] hospital lab) Nasopharyngeal Nasopharyngeal Swab     Status: None   Collection Time: 11/21/18  3:50 PM   Specimen: Nasopharyngeal Swab  Result Value Ref Range Status   SARS Coronavirus 2 NEGATIVE NEGATIVE Final    Comment: (NOTE) If result is NEGATIVE SARS-CoV-2 target nucleic acids are NOT DETECTED. The SARS-CoV-2 RNA is generally detectable in upper and lower  respiratory specimens during the acute phase of infection. The lowest  concentration of SARS-CoV-2 viral copies this assay can detect is 250  copies / mL. A negative result does not preclude SARS-CoV-2 infection  and should not be used as the sole basis for treatment or other  patient management decisions.  A negative result may occur with  improper specimen collection / handling, submission of specimen other  than nasopharyngeal swab, presence of viral mutation(s) within the  areas targeted by this assay, and inadequate number of viral copies  (<250 copies / mL). A negative result must be combined with clinical  observations, patient history, and epidemiological information. If result is POSITIVE SARS-CoV-2 target nucleic acids are DETECTED. The SARS-CoV-2 RNA is generally detectable in upper and lower  respiratory specimens dur ing the acute phase of infection.  Positive  results are  indicative of active infection with SARS-CoV-2.  Clinical  correlation with patient history and other diagnostic information is  necessary to determine patient infection status.  Positive results do  not rule out bacterial infection or co-infection with other viruses. If result is PRESUMPTIVE POSTIVE SARS-CoV-2 nucleic acids MAY BE PRESENT.   A presumptive positive result was obtained on the submitted specimen  and confirmed on repeat testing.  While 2019 novel coronavirus  (SARS-CoV-2) nucleic acids may be present in the submitted sample  additional confirmatory testing may be necessary for epidemiological  and / or clinical management purposes  to differentiate between  SARS-CoV-2 and other Sarbecovirus currently known to infect humans.  If clinically indicated additional testing with an alternate test  methodology [phone]) is advised. The SARS-CoV-2 RNA is generally  detectable in upper and lower respiratory sp ecimens during the acute  phase of infection. The expected result is Negative. Fact Sheet for Patients:  StrictlyIdeas.no Fact Sheet for Healthcare Providers: BankingDealers.co.za This test is not yet approved or cleared by the [place] FDA and has been authorized for detection and/or diagnosis of SARS-CoV-2 by FDA under an Emergency Use Authorization (EUA).  This EUA will remain in  effect (meaning this test can be used) for the duration of the COVID-19 declaration under Section 564(b)(1) of the Act, 21 U.S.C. section 360bbb-3(b)(1), unless the authorization is terminated or revoked sooner. Performed at [facility], [address], [place], [place] [address]   Surgical pcr screen     Status: None   Collection Time: 11/22/18  7:44 AM   Specimen: Nasal Mucosa; Nasal Swab  Result Value Ref Range Status   MRSA, PCR NEGATIVE NEGATIVE Final   Staphylococcus aureus NEGATIVE NEGATIVE Final    Comment: (NOTE) The Xpert  SA Assay (FDA approved for NASAL specimens in patients 49 years of age and older), is one component of a comprehensive surveillance program. It is not intended to diagnose infection nor to guide or monitor treatment. Performed at [facility], [address], [place], [place] [address]          Radiology Studies: Dg C-arm 1-60 Min  Result Date: 11/22/2018 CLINICAL DATA:  Intraoperative localization for ORIF of right hip fracture. EXAM: OPERATIVE RIGHT HIP (WITH PELVIS IF PERFORMED)  VIEWS TECHNIQUE: Fluoroscopic spot image(s) were submitted for interpretation post-operatively. COMPARISON:  Prior radiograph from 11/21/2018 FINDINGS: Multiple spot intraoperative fluoroscopic views from ORIF of a intertrochanteric right hip fracture are seen. Views demonstrate placement of an IM fixation nail with proximal interlocking screw. Hardware appears well positioned without adverse features. Right hip fracture in gross anatomic alignment. No complication. Fluoroscopy time equals 39 seconds.  Three spot images obtained. IMPRESSION: Intraoperative localization for ORIF of intertrochanteric right hip fracture. No complication. Electronically Signed   By: Rise Mu M.D.   On: 11/22/2018 14:59   Dg Hip Operative Unilat W Or W/o Pelvis Left  Result Date: 11/22/2018 CLINICAL DATA:  Intraoperative localization for ORIF of right hip fracture. EXAM: OPERATIVE RIGHT HIP (WITH PELVIS IF PERFORMED)  VIEWS TECHNIQUE: Fluoroscopic spot image(s) were submitted for interpretation post-operatively. COMPARISON:  Prior radiograph from 11/21/2018 FINDINGS: Multiple spot intraoperative fluoroscopic views from ORIF of a intertrochanteric right hip fracture are seen. Views demonstrate placement of an IM fixation nail with proximal interlocking screw. Hardware appears well positioned without adverse features. Right hip fracture in gross anatomic alignment. No complication. Fluoroscopy time equals 39 seconds.   Three spot images obtained. IMPRESSION: Intraoperative localization for ORIF of intertrochanteric right hip fracture. No complication. Electronically Signed   By: Rise Mu M.D.   On: 11/22/2018 14:59        Scheduled Meds: . atenolol  25 mg Oral Daily  . docusate sodium  100 mg Oral BID  . losartan  25 mg Oral Daily  . Rivaroxaban  15 mg Oral Q supper   Continuous Infusions:   LOS: 3 days    Time spent:    Zannie Cove, MD [facility]  11/24/2018, 12:43 PM

## 2018-11-24 NOTE — Progress Notes (Signed)
SPORTS MEDICINE AND JOINT REPLACEMENT  Lara Mulch, MD    Carlyon Shadow, PA-C [place], [place], [place]  [address]                             [phone]   PROGRESS NOTE  Subjective:  negative for Chest Pain  negative for Shortness of Breath  negative for Nausea/Vomiting   negative for Calf Pain  negative for Bowel Movement   Tolerating Diet: yes         Patient reports pain as 2 on 0-10 scale.    Objective: Vital signs in last 24 hours:    Patient Vitals for the past 24 hrs:  BP Temp Temp src Pulse Resp SpO2  11/24/18 0840 (!) 130/59 98.7 F (37.1 C) Oral 95 18 98 %  11/24/18 0329 (!) 156/63 98.4 F (36.9 C) Oral 81 - 97 %  11/23/18 2009 140/87 98.2 F (36.8 C) Oral 69 - 96 %  11/23/18 1335 121/71 98.3 F (36.8 C) Oral 87 18 92 %    @flow {1959:LAST@   Intake/Output from previous day:   10/30 0701 - 10/31 0700 In: 720 [P.O.:720] Out: 400 [Urine:400]   Intake/Output this shift:   No intake/output data recorded.   Intake/Output      10/30 0701 - 10/31 0700 10/31 0701 - 11/01 0700   P.O. 720    I.V. (mL/kg)     Total Intake(mL/kg) 720 (10.2)    Urine (mL/kg/hr) 400 (0.2)    Blood     Total Output 400    Net +320         Urine Occurrence 1 x       LABORATORY DATA: Recent Labs    11/21/18 1403 11/22/18 0329 11/23/18 0445  WBC 7.7 8.4 9.2  HGB 14.6 13.3 13.1  HCT 46.1* 40.8 40.4  PLT 229 197 179   Recent Labs    11/21/18 1403 11/22/18 0329 11/23/18 0445  NA 140 138 137  K 4.3 3.9 4.4  CL 106 107 106  CO2 21* 20* 23  BUN 15 16 17   CREATININE 0.85 0.82 0.88  GLUCOSE 110* 93 185*  CALCIUM 9.5 8.8* 8.7*   Lab Results  Component Value Date   INR 1.2 11/21/2018   INR 2.4 04/19/2018   INR 2.3 03/09/2018    Examination:  General appearance: alert, cooperative and no distress Extremities: extremities normal, atraumatic, no cyanosis or edema  Wound Exam: clean, dry, intact   Drainage:  None: wound tissue dry  Motor Exam:  Quadriceps and Hamstrings Intact  Sensory Exam: Superficial Peroneal, Deep Peroneal and Tibial normal   Assessment:    2 Days Post-Op  Procedure(s) (LRB): INTRAMEDULLARY (IM) NAIL INTERTROCHANTRIC (Right)  ADDITIONAL DIAGNOSIS:  Principal Problem:   Femur fracture, right (HCC) Active Problems:   Essential hypertension   AF (paroxysmal atrial fibrillation) (HCC)     Plan: Physical Therapy as ordered Weight Bearing as Tolerated (WBAT)  DVT Prophylaxis:  Xarelto  Patient doing very well from an orthopedic standpoint. Ok to  D/C. Medicine following    Donia Ast 11/24/2018, 8:43 AM

## 2018-11-24 NOTE — Progress Notes (Signed)
Physical Therapy Treatment Patient Details Name: Ellen Barajas MRN: [id number] DOB: May 20, 1928 Today's Date: 11/24/2018    History of Present Illness 85 yo female admitted to ED on 10/28 with fall and R intertrochanteric femur fracture, S/p R IM nailing on 10/29. PMH includes afib, CAD s/p CABG, HTN, OA, ischemic heart disease, L IM nail 2019.    PT Comments    Pt agreeable to work with therapy. She was able to progress OOB to recliner chair with assist. Ambulation distance limited to 8 ft secondary to pain. Pt seems unaware of limitations. When discussing how to enter her home, she believes she can climb the 3 steps necessary.  Spoke with pt niece over the phone about using WC to assist pt into home and left instructions in pt room. Updated equipment recommendations to include WC as pt can only ambulate 8 ft at this time. Pt and family are declining SNF placement and the niece states they have hired a [employer] for 24 hour assist. Will continue to follow acutely for mobility progression.    Follow Up Recommendations  SNF;Supervision/Assistance - 24 hour(pt and family decline; d/c home with HHPT and 24/7 assist)     Equipment Recommendations  Rolling walker with 5" wheels;3in1 (PT);Wheelchair (measurements PT);Wheelchair cushion (measurements PT)    Recommendations for Other Services       Precautions / Restrictions Precautions Precautions: Fall Restrictions Weight Bearing Restrictions: Yes RLE Weight Bearing: Weight bearing as tolerated    Mobility  Bed Mobility Overal bed mobility: Needs Assistance Bed Mobility: Sit to Supine     Supine to sit: Min assist     General bed mobility comments: min A for LE management and trunk elevation. Able to scoot hips forward once up  Transfers Overall transfer level: Needs assistance Equipment used: Rolling walker (2 wheeled) Transfers: Sit to/from Stand Sit to Stand: Mod assist         General transfer comment: mod A to power up. VC  for hand placement  Ambulation/Gait Ambulation/Gait assistance: Min assist Gait Distance (Feet): 8 Feet Assistive device: Rolling walker (2 wheeled) Gait Pattern/deviations: Step-to pattern;Decreased stance time - right;Decreased weight shift to right;Trunk flexed;Antalgic Gait velocity: decreased   General Gait Details: min A to steady. Pt with difficulty WBing on R LE to progress LLE. As pt fatigued she was unable to lift LLE. Chair brought up behind for pt to sit.   Stairs             Wheelchair Mobility    Modified Rankin (Stroke Patients Only)       Balance Overall balance assessment: Needs assistance Sitting-balance support: Feet unsupported;No upper extremity supported Sitting balance-Leahy Scale: Good     Standing balance support: Bilateral upper extremity supported;During functional activity Standing balance-Leahy Scale: Poor Standing balance comment: reliant on UE support                            Cognition Arousal/Alertness: Awake/alert Behavior During Therapy: WFL for tasks assessed/performed Overall Cognitive Status: Within Functional Limits for tasks assessed                                 General Comments: seems unaware of limitations. She believes she can walk up 3 steps to get into her house.      Exercises Total Joint Exercises Ankle Circles/Pumps: AROM;Both;10 reps;Supine Quad Sets: AROM;Right;10 reps;Supine Short Arc Quad:  AROM;Right;10 reps;Supine Heel Slides: AROM;Right;10 reps;Supine Hip ABduction/ADduction: AROM;Right;10 reps;Supine    General Comments        Pertinent Vitals/Pain Pain Assessment: Faces Faces Pain Scale: Hurts even more Pain Location: R LE during mobility Pain Descriptors / Indicators: Grimacing;Guarding;Sore;Spasm Pain Intervention(s): Monitored during session;Limited activity within patient's tolerance;Repositioned    Home Living                      Prior Function             PT Goals (current goals can now be found in the care plan section) Acute Rehab PT Goals Patient Stated Goal: go home PT Goal Formulation: With patient Time For Goal Achievement: 12/07/18 Potential to Achieve Goals: Good Progress towards PT goals: Progressing toward goals    Frequency    Min 5X/week      PT Plan Equipment recommendations need to be updated    Co-evaluation              AM-PAC PT "6 Clicks" Mobility   Outcome Measure  Help needed turning from your back to your side while in a flat bed without using bedrails?: A Little Help needed moving from lying on your back to sitting on the side of a flat bed without using bedrails?: A Lot Help needed moving to and from a bed to a chair (including a wheelchair)?: A Lot Help needed standing up from a chair using your arms (e.g., wheelchair or bedside chair)?: A Lot Help needed to walk in hospital room?: A Lot Help needed climbing 3-5 steps with a railing? : Total 6 Click Score: 12    End of Session Equipment Utilized During Treatment: Gait belt Activity Tolerance: Patient tolerated treatment well;Patient limited by pain Patient left: with call bell/phone within reach;in chair Nurse Communication: Mobility status PT Visit Diagnosis: Difficulty in walking, not elsewhere classified (R26.2);Unsteadiness on feet (R26.81);Muscle weakness (generalized) (M62.81);History of falling (Z91.81)     Time: 1415-1450 PT Time Calculation (min) (ACUTE ONLY): 35 min  Charges:  $Gait Training: 8-22 mins $Therapeutic Exercise: 8-22 mins                     Kallie Locks, [place] Pager [phone] Acute Rehab   Sheral Apley 11/24/2018, 3:04 PM

## 2018-11-24 NOTE — Plan of Care (Signed)
  Problem: Pain Managment: Goal: General experience of comfort will improve Outcome: Progressing   Problem: Safety: Goal: Ability to remain free from injury will improve Outcome: Progressing   

## 2018-11-25 DIAGNOSIS — S72141A Displaced intertrochanteric fracture of right femur, initial encounter for closed fracture: Secondary | ICD-10-CM

## 2018-11-25 MED ORDER — POLYETHYLENE GLYCOL 3350 17 G PO PACK: 17.0000 g | PACK | Freq: Every day | ORAL | 0 refills | Status: DC | PRN

## 2018-11-25 NOTE — Discharge Summary (Signed)
Physician Discharge Summary  Ellen Barajas WUJ:[id number] DOB: 1928/02/18 DOA: 11/21/2018  PCP: Ria Bush, MD  Admit date: 11/21/2018 Discharge date: 11/25/2018  Time spent: 45 minutes  Recommendations for Outpatient Follow-up:  Orthopedics Dr.Murphy in 7-10days PCP in 1 week   Discharge Diagnoses:  Principal Problem:   Femur fracture, right ([place]) Active Problems:   Essential hypertension   AF (paroxysmal atrial fibrillation) ([place])   Bilateral intertrochanteric hip fractures ([place])   Discharge Condition: stable  Diet recommendation: heart healthy  Filed Weights   11/21/18 1341 11/22/18 1145  Weight: 70.3 kg 70.3 kg    History of present illness:   Ellen Barajas a 85 y.o.femalewith medical history significant ofhypertension, paroxysmal A. fib on Xarelto, coronary artery disease status post CABG presents to emergency department after a fall at home. Patient reports that she was raking leaves 10/28 morning, she turned around, lost her balance and fell to the ground, subsequently was not able to ambulate and had right hip pain, imaging noted right hip intertrochanteric fracture  Hospital Course:   Acute right intertrochanteric fracture: -Following mechanical fall, orthopedic surgery following -Underwent intertrochanteric intramedullary nail placement 10/29 by Dr. Percell Miller -Stable postop, restarted Xarelto -PT OT eval completed, SNF recommended -Patient declined SNF for rehab, she decided to go home with home health services and family assistance today  Hypertension:  -Stable, continue atenolol and losartan  Paroxysmal A. fib: Rate  -Rate controlled, continue atenolol,  Xarelto  DVT prophylaxis:  Xarelto   Discharge Exam: Vitals:   11/25/18 0736 11/25/18 1303  BP: (!) 164/77 (!) 111/45  Pulse: 83 76  Resp: 18 18  Temp: 98.2 F (36.8 C) 98.2 F (36.8 C)  SpO2: 98% 100%    General: AAOx3 Cardiovascular: S1S2/RRR Respiratory: CTAB  Discharge  Instructions   Discharge Instructions    Diet - low sodium heart healthy   Complete by: As directed    Increase activity slowly   Complete by: As directed      Allergies as of 11/25/2018      Reactions   Crestor [rosuvastatin Calcium]    unknown   Lipitor [atorvastatin Calcium]    unknown   Niaspan [niacin Er]    unknown   Other    PER PATIENT SHE DOES NOT TOLERATE NARCOTICS   Pravastatin    unknown   Ramipril [ramipril]    Hyperkalemia   Welchol [colesevelam Hcl]    unknown   Zetia [ezetimibe]    unknown      Medication List    TAKE these medications   atenolol 25 MG tablet Commonly known as: TENORMIN Take 1 tablet (25 mg total) by mouth daily.   HYDROcodone-acetaminophen 5-325 MG tablet Commonly known as: Norco Take 1 tablet by mouth every 6 (six) hours as needed for up to 5 days for severe pain (use tylenol for mild and moderate pain).   losartan 25 MG tablet Commonly known as: Cozaar Take 1 tablet (25 mg total) by mouth daily.   multivitamin with minerals Tabs tablet Take 1 tablet by mouth daily.   polyethylene glycol 17 g packet Commonly known as: MIRALAX / GLYCOLAX Take 17 g by mouth daily as needed for mild constipation.   Rivaroxaban 15 MG Tabs tablet Commonly known as: Xarelto Take 1 tablet (15 mg total) by mouth daily with supper.            Durable Medical Equipment  (From admission, onward)         Start  Ordered   11/25/18 0834  For home use only DME lightweight manual wheelchair with seat cushion  Once    Comments: Patient suffers from femur fracture which impairs their ability to perform daily activities like bathing, feeding, toileting, dressing in the home.  A walker will not resolve  issue with performing activities of daily living. A wheelchair will allow patient to safely perform daily activities. Patient is not able to propel themselves in the home using a standard weight wheelchair due to weakness. Patient can self propel in  the lightweight wheelchair. Length of need 3months. Accessories: elevating leg rests (ELRs), wheel locks, extensions and anti-tippers, back cushion.   11/25/18 0837   11/24/18 1246  For home use only DME Bedside commode  Once    Question:  Patient needs a bedside commode to treat with the following condition  Answer:  Hip fracture (HCC)   11/24/18 1246         Allergies  Allergen Reactions  . Crestor [Rosuvastatin Calcium]     unknown  . Lipitor [Atorvastatin Calcium]     unknown  . Niaspan [Niacin Er]     unknown  . Other     PER PATIENT SHE DOES NOT TOLERATE NARCOTICS  . Pravastatin     unknown  . Ramipril [Ramipril]     Hyperkalemia  . Welchol [Colesevelam Hcl]     unknown  . Zetia [Ezetimibe]     unknown   Follow-up Information    Sheral ApleyMurphy, Timothy D, MD In 2 weeks.   Specialty: Orthopedic Surgery Contact information: [address] [place] [id number] [phone]        Eustaquio BoydenGutierrez, Javier, MD. Schedule an appointment as soon as possible for a visit in 1 week(s).   Specialty: Family Medicine Contact information: [address] [place] [address] [phone]        Nahser, Deloris PingPhilip J, MD .   Specialty: Cardiology Contact information: [address] [place] [address] [phone]            The results of significant diagnostics from this hospitalization (including imaging, microbiology, ancillary and laboratory) are listed below for reference.    Significant Diagnostic Studies: Dg Chest Port 1 View  Result Date: 11/21/2018 CLINICAL DATA:  Medical clearance, fall with hip pain. EXAM: PORTABLE CHEST 1 VIEW COMPARISON:  09/27/2017. FINDINGS: Trachea is midline. Heart is enlarged, stable. Thoracic aorta is calcified. Lungs are hyperinflated but clear. No pleural fluid. Osseous structures appear grossly intact. IMPRESSION: 1. Hyperinflation without acute finding. 2.  Aortic atherosclerosis (ICD10-170.0).  Electronically Signed   By: Leanna BattlesMelinda  Blietz M.D.   On: 11/21/2018 14:39   Dg C-arm 1-60 Min  Result Date: 11/22/2018 CLINICAL DATA:  Intraoperative localization for ORIF of right hip fracture. EXAM: OPERATIVE RIGHT HIP (WITH PELVIS IF PERFORMED)  VIEWS TECHNIQUE: Fluoroscopic spot image(s) were submitted for interpretation post-operatively. COMPARISON:  Prior radiograph from 11/21/2018 FINDINGS: Multiple spot intraoperative fluoroscopic views from ORIF of a intertrochanteric right hip fracture are seen. Views demonstrate placement of an IM fixation nail with proximal interlocking screw. Hardware appears well positioned without adverse features. Right hip fracture in gross anatomic alignment. No complication. Fluoroscopy time equals 39 seconds.  Three spot images obtained. IMPRESSION: Intraoperative localization for ORIF of intertrochanteric right hip fracture. No complication. Electronically Signed   By: Rise MuBenjamin  McClintock M.D.   On: 11/22/2018 14:59   Dg Hip Operative Unilat W Or W/o Pelvis Left  Result Date: 11/22/2018 CLINICAL  DATA:  Intraoperative localization for ORIF of right hip fracture. EXAM: OPERATIVE RIGHT HIP (WITH PELVIS IF PERFORMED)  VIEWS TECHNIQUE: Fluoroscopic spot image(s) were submitted for interpretation post-operatively. COMPARISON:  Prior radiograph from 11/21/2018 FINDINGS: Multiple spot intraoperative fluoroscopic views from ORIF of a intertrochanteric right hip fracture are seen. Views demonstrate placement of an IM fixation nail with proximal interlocking screw. Hardware appears well positioned without adverse features. Right hip fracture in gross anatomic alignment. No complication. Fluoroscopy time equals 39 seconds.  Three spot images obtained. IMPRESSION: Intraoperative localization for ORIF of intertrochanteric right hip fracture. No complication. Electronically Signed   By: Rise Mu M.D.   On: 11/22/2018 14:59   Dg Hip Unilat With Pelvis 2-3 Views  Right  Result Date: 11/21/2018 CLINICAL DATA:  Fall with hip pain shortening and rotation of the right hip EXAM: DG HIP (WITH OR WITHOUT PELVIS) 2-3V RIGHT COMPARISON:  09/27/2017 FINDINGS: Prior intramedullary rodding of the left femur for prior intertrochanteric fracture. Safety pins over the medial upper thighs. Pubic symphysis appears intact. SI joints are non widened. Acute right intertrochanteric fracture. Femoral head position is normal. Possible fracture deformity of the right superior pubic ramus. IMPRESSION: 1. Acute right intertrochanteric fracture 2. Possible fracture deformity right superior pubic ramus 3. Prior intramedullary rodding of the left femur. Electronically Signed   By: Jasmine Pang M.D.   On: 11/21/2018 14:40    Microbiology: Recent Results (from the past 240 hour(s))  SARS Coronavirus 2 by RT PCR (hospital order, performed in [facility] hospital lab) Nasopharyngeal Nasopharyngeal Swab     Status: None   Collection Time: 11/21/18  3:50 PM   Specimen: Nasopharyngeal Swab  Result Value Ref Range Status   SARS Coronavirus 2 NEGATIVE NEGATIVE Final    Comment: (NOTE) If result is NEGATIVE SARS-CoV-2 target nucleic acids are NOT DETECTED. The SARS-CoV-2 RNA is generally detectable in upper and lower  respiratory specimens during the acute phase of infection. The lowest  concentration of SARS-CoV-2 viral copies this assay can detect is 250  copies / mL. A negative result does not preclude SARS-CoV-2 infection  and should not be used as the sole basis for treatment or other  patient management decisions.  A negative result may occur with  improper specimen collection / handling, submission of specimen other  than nasopharyngeal swab, presence of viral mutation(s) within the  areas targeted by this assay, and inadequate number of viral copies  (<250 copies / mL). A negative result must be combined with clinical  observations, patient history, and epidemiological  information. If result is POSITIVE SARS-CoV-2 target nucleic acids are DETECTED. The SARS-CoV-2 RNA is generally detectable in upper and lower  respiratory specimens dur ing the acute phase of infection.  Positive  results are indicative of active infection with SARS-CoV-2.  Clinical  correlation with patient history and other diagnostic information is  necessary to determine patient infection status.  Positive results do  not rule out bacterial infection or co-infection with other viruses. If result is PRESUMPTIVE POSTIVE SARS-CoV-2 nucleic acids MAY BE PRESENT.   A presumptive positive result was obtained on the submitted specimen  and confirmed on repeat testing.  While 2019 novel coronavirus  (SARS-CoV-2) nucleic acids may be present in the submitted sample  additional confirmatory testing may be necessary for epidemiological  and / or clinical management purposes  to differentiate between  SARS-CoV-2 and other Sarbecovirus currently known to infect humans.  If clinically indicated additional testing with an alternate test  methodology [phone]) is advised. The SARS-CoV-2 RNA is generally  detectable in upper and lower respiratory sp ecimens during the acute  phase of infection. The expected result is Negative. Fact Sheet for Patients:  BoilerBrush.com.cy Fact Sheet for Healthcare Providers: https://pope.com/ This test is not yet approved or cleared by the [place] FDA and has been authorized for detection and/or diagnosis of SARS-CoV-2 by FDA under an Emergency Use Authorization (EUA).  This EUA will remain in effect (meaning this test can be used) for the duration of the COVID-19 declaration under Section 564(b)(1) of the Act, 21 U.S.C. section 360bbb-3(b)(1), unless the authorization is terminated or revoked sooner. Performed at [facility], [address] [place], [place] [address]   Surgical pcr screen      Status: None   Collection Time: 11/22/18  7:44 AM   Specimen: Nasal Mucosa; Nasal Swab  Result Value Ref Range Status   MRSA, PCR NEGATIVE NEGATIVE Final   Staphylococcus aureus NEGATIVE NEGATIVE Final    Comment: (NOTE) The Xpert SA Assay (FDA approved for NASAL specimens in patients 50 years of age and older), is one component of a comprehensive surveillance program. It is not intended to diagnose infection nor to guide or monitor treatment. Performed at [facility], [address], [place], [place] [address]      Labs: Basic Metabolic Panel: Recent Labs  Lab 11/21/18 1403 11/22/18 0329 11/23/18 0445 11/24/18 0806  NA 140 138 137 138  K 4.3 3.9 4.4 4.5  CL 106 107 106 105  CO2 21* 20* 23 22  GLUCOSE 110* 93 185* 140*  BUN 15 16 17 22   CREATININE 0.85 0.82 0.88 0.82  CALCIUM 9.5 8.8* 8.7* 8.5*   Liver Function Tests: No results for input(s): AST, ALT, ALKPHOS, BILITOT, PROT, ALBUMIN in the last 168 hours. No results for input(s): LIPASE, AMYLASE in the last 168 hours. No results for input(s): AMMONIA in the last 168 hours. CBC: Recent Labs  Lab 11/21/18 1403 11/22/18 0329 11/23/18 0445 11/24/18 0806  WBC 7.7 8.4 9.2 10.8*  NEUTROABS 6.1  --   --   --   HGB 14.6 13.3 13.1 12.7  HCT 46.1* 40.8 40.4 39.8  MCV 94.9 93.4 92.4 94.3  PLT 229 197 179 165   Cardiac Enzymes: No results for input(s): CKTOTAL, CKMB, CKMBINDEX, TROPONINI in the last 168 hours. BNP: BNP (last 3 results) No results for input(s): BNP in the last 8760 hours.  ProBNP (last 3 results) No results for input(s): PROBNP in the last 8760 hours.  CBG: No results for input(s): GLUCAP in the last 168 hours.     Signed:  11/26/18 MD.  [facility] 11/25/2018, 1:23 PM

## 2018-11-25 NOTE — Progress Notes (Signed)
Physical Therapy Treatment Patient Details Name: Ellen Barajas MRN: [id number] DOB: 1928-02-24 Today's Date: 11/25/2018    History of Present Illness 85 yo female admitted to ED on 10/28 with fall and R intertrochanteric femur fracture, S/p R IM nailing on 10/29. PMH includes afib, CAD s/p CABG, HTN, OA, ischemic heart disease, L IM nail 2019.    PT Comments    Continuing work on functional mobility and activity tolerance;  Notable progress in gait distance, she seemed pleased; She tells me about her reliable assistance at home -- she will need 24 hour assistance for mobility and ADLs and continuing follow up therapies to maximize independence and safety with mobility and ADLs   Follow Up Recommendations  Home health PT;Supervision/Assistance - 24 hour     Equipment Recommendations  Rolling walker with 5" wheels;3in1 (PT);Wheelchair (measurements PT);Wheelchair cushion (measurements PT)    Recommendations for Other Services       Precautions / Restrictions Precautions Precautions: Fall Restrictions RLE Weight Bearing: Weight bearing as tolerated    Mobility  Bed Mobility Overal bed mobility: Needs Assistance Bed Mobility: Supine to Sit     Supine to sit: Min assist     General bed mobility comments:  Cues and use of bed pad to half-bridge to EOB in prep for getting to EOB; min A for LE management and trunk elevation. Able to scoot hips forward once up  Transfers Overall transfer level: Needs assistance Equipment used: Rolling walker (2 wheeled) Transfers: Sit to/from Stand Sit to Stand: Min assist         General transfer comment: Cues for hand placement and safety; min assist to steady  Ambulation/Gait Ambulation/Gait assistance: Mod assist;+2 safety/equipment Gait Distance (Feet): 60 Feet Assistive device: Rolling walker (2 wheeled) Gait Pattern/deviations: Step-through pattern(emergign) Gait velocity: decreased   General Gait Details: min A to steady. Pt with  difficulty WBing on R LE to progress LLE, occasional mod assist to help advance LLE, and manual facilitation to encourgae weight shift onto RLE in stance. As pt fatigued she was unable to lift LLE. Chair brought up behind for pt to sit.   Stairs             Wheelchair Mobility    Modified Rankin (Stroke Patients Only)       Balance     Sitting balance-Leahy Scale: Good       Standing balance-Leahy Scale: Poor                              Cognition Arousal/Alertness: Awake/alert Behavior During Therapy: WFL for tasks assessed/performed Overall Cognitive Status: Within Functional Limits for tasks assessed                                        Exercises      General Comments        Pertinent Vitals/Pain Pain Assessment: Faces Faces Pain Scale: Hurts little more Pain Location: R LE during mobility Pain Descriptors / Indicators: Grimacing;Guarding;Sore;Spasm Pain Intervention(s): Monitored during session    Home Living                      Prior Function            PT Goals (current goals can now be found in the care plan section) Acute Rehab PT Goals Patient  Stated Goal: go home PT Goal Formulation: With patient Time For Goal Achievement: 12/07/18 Potential to Achieve Goals: Good Progress towards PT goals: Progressing toward goals    Frequency    Min 5X/week      PT Plan Current plan remains appropriate    Co-evaluation              AM-PAC PT "6 Clicks" Mobility   Outcome Measure  Help needed turning from your back to your side while in a flat bed without using bedrails?: A Little Help needed moving from lying on your back to sitting on the side of a flat bed without using bedrails?: A Lot Help needed moving to and from a bed to a chair (including a wheelchair)?: A Lot Help needed standing up from a chair using your arms (e.g., wheelchair or bedside chair)?: A Lot Help needed to walk in hospital  room?: A Lot Help needed climbing 3-5 steps with a railing? : Total 6 Click Score: 12    End of Session Equipment Utilized During Treatment: Gait belt Activity Tolerance: Patient tolerated treatment well;Patient limited by pain Patient left: with call bell/phone within reach;in chair;with chair alarm set Nurse Communication: Mobility status PT Visit Diagnosis: Difficulty in walking, not elsewhere classified (R26.2);Unsteadiness on feet (R26.81);Muscle weakness (generalized) (M62.81);History of falling (Z91.81)     Time: 6599-3570 PT Time Calculation (min) (ACUTE ONLY): 28 min  Charges:  $Gait Training: 23-37 mins                     Van Clines, [place]  Acute Rehabilitation Services Pager [phone] Office [phone]    Levi Aland 11/25/2018, 4:39 PM

## 2018-11-25 NOTE — Progress Notes (Signed)
    Durable Medical Equipment  (From admission, onward)         Start     Ordered   11/25/18 0834  For home use only DME lightweight manual wheelchair with seat cushion  Once    Comments: Patient suffers from femur fracture which impairs their ability to perform daily activities like bathing, feeding, toileting, dressing in the home.  A walker will not resolve  issue with performing activities of daily living. A wheelchair will allow patient to safely perform daily activities. Patient is not able to propel themselves in the home using a standard weight wheelchair due to weakness. Patient can self propel in the lightweight wheelchair. Length of need 34months. Accessories: elevating leg rests (ELRs), wheel locks, extensions and anti-tippers, back cushion.   11/25/18 0837   11/24/18 1246  For home use only DME Bedside commode  Once    Question:  Patient needs a bedside commode to treat with the following condition  Answer:  Hip fracture [facility])   11/24/18 Mallory W,PT [place] Pager:  [phone]  Office:  [phone]

## 2018-11-25 NOTE — Progress Notes (Addendum)
PT note Pt will need 18x16 wheelchair lightweight with desk armrests, anti tippers and elevating legrests.  Pt will need an 18x16 pressure relieving cushion as well.  Thanks.  Ellen Barajas,PT Acute Rehabilitation Services Pager:  [phone]  Office:  [phone]

## 2018-11-25 NOTE — Plan of Care (Signed)
  Problem: Pain Managment: Goal: General experience of comfort will improve Outcome: Progressing   Problem: Safety: Goal: Ability to remain free from injury will improve Outcome: Progressing   

## 2018-11-25 NOTE — TOC Transition Note (Signed)
Transition of Care [facility]) - CM/SW Discharge Note   Patient Details  Name: WEALTHY DANIELSKI MRN: [id number] Date of Birth: January 05, 1929  Transition of Care [facility]) CM/SW Contact:  Claudie Leach, RN Phone Number: [phone] 11/25/2018, 3:21 PM   Clinical Narrative:    Patient to d/c home with niece and sitters to provide 24 hour care.  Pt provided WC and 3n1 .  She has RW and all other necessary DME.  Discussed agencies with niece and she chooses [place].  [facility] able to accept referral.     Final next level of care: Home w Home Health Services Barriers to Discharge: No Barriers Identified   Patient Goals and CMS Choice Patient states their goals for this hospitalization and ongoing recovery are:: to be independent CMS Medicare.gov Compare Post Acute Care list provided to:: Patient Represenative (must comment)(Niece) Choice offered to / list presented to : [facility] POA / Guardian(Niece)   Discharge Plan and Services      DME Arranged: Lightweight manual wheelchair with seat cushion, 3-N-1 DME Agency: [employer] Date DME Agency Contacted: 11/25/18 Time DME Agency Contacted: 1000 Representative spoke with at DME Agency: [place]: PT, OT, Nurse's Aide [place] Agency: [place] Date [place]: 11/25/18 Time [place]: 1501 Representative spoke with at [place]: Tommi Rumps

## 2018-11-27 DIAGNOSIS — M170 Bilateral primary osteoarthritis of knee: Secondary | ICD-10-CM | POA: Diagnosis not present

## 2018-11-27 DIAGNOSIS — S72141A Displaced intertrochanteric fracture of right femur, initial encounter for closed fracture: Secondary | ICD-10-CM | POA: Diagnosis not present

## 2018-11-27 DIAGNOSIS — I480 Paroxysmal atrial fibrillation: Secondary | ICD-10-CM | POA: Diagnosis not present

## 2018-11-27 DIAGNOSIS — S72142D Displaced intertrochanteric fracture of left femur, subsequent encounter for closed fracture with routine healing: Secondary | ICD-10-CM | POA: Diagnosis not present

## 2018-11-27 DIAGNOSIS — Z7901 Long term (current) use of anticoagulants: Secondary | ICD-10-CM | POA: Diagnosis not present

## 2018-11-27 DIAGNOSIS — S72141D Displaced intertrochanteric fracture of right femur, subsequent encounter for closed fracture with routine healing: Secondary | ICD-10-CM | POA: Diagnosis not present

## 2018-11-27 DIAGNOSIS — Z79891 Long term (current) use of opiate analgesic: Secondary | ICD-10-CM | POA: Diagnosis not present

## 2018-11-27 DIAGNOSIS — I119 Hypertensive heart disease without heart failure: Secondary | ICD-10-CM | POA: Diagnosis not present

## 2018-11-27 DIAGNOSIS — H9193 Unspecified hearing loss, bilateral: Secondary | ICD-10-CM | POA: Diagnosis not present

## 2018-11-27 DIAGNOSIS — Z9181 History of falling: Secondary | ICD-10-CM | POA: Diagnosis not present

## 2018-11-27 DIAGNOSIS — I2510 Atherosclerotic heart disease of native coronary artery without angina pectoris: Secondary | ICD-10-CM | POA: Diagnosis not present

## 2018-11-27 DIAGNOSIS — I700 Atherosclerosis of aorta: Secondary | ICD-10-CM | POA: Diagnosis not present

## 2018-11-27 DIAGNOSIS — Z951 Presence of aortocoronary bypass graft: Secondary | ICD-10-CM | POA: Diagnosis not present

## 2018-11-28 ENCOUNTER — Telehealth: Payer: Self-pay | Admitting: Family Medicine

## 2018-11-28 NOTE — Telephone Encounter (Signed)
No answer left voicemail to call office,

## 2018-12-05 DIAGNOSIS — S72141A Displaced intertrochanteric fracture of right femur, initial encounter for closed fracture: Secondary | ICD-10-CM | POA: Diagnosis not present

## 2018-12-12 DIAGNOSIS — S7221XD Displaced subtrochanteric fracture of right femur, subsequent encounter for closed fracture with routine healing: Secondary | ICD-10-CM | POA: Diagnosis not present

## 2018-12-26 DIAGNOSIS — S7221XD Displaced subtrochanteric fracture of right femur, subsequent encounter for closed fracture with routine healing: Secondary | ICD-10-CM | POA: Diagnosis not present

## 2018-12-31 DIAGNOSIS — S72141A Displaced intertrochanteric fracture of right femur, initial encounter for closed fracture: Secondary | ICD-10-CM | POA: Diagnosis not present

## 2018-12-31 DIAGNOSIS — S72142D Displaced intertrochanteric fracture of left femur, subsequent encounter for closed fracture with routine healing: Secondary | ICD-10-CM | POA: Diagnosis not present

## 2018-12-31 DIAGNOSIS — I119 Hypertensive heart disease without heart failure: Secondary | ICD-10-CM | POA: Diagnosis not present

## 2018-12-31 DIAGNOSIS — I480 Paroxysmal atrial fibrillation: Secondary | ICD-10-CM | POA: Diagnosis not present

## 2018-12-31 DIAGNOSIS — I2510 Atherosclerotic heart disease of native coronary artery without angina pectoris: Secondary | ICD-10-CM | POA: Diagnosis not present

## 2018-12-31 DIAGNOSIS — I700 Atherosclerosis of aorta: Secondary | ICD-10-CM | POA: Diagnosis not present

## 2018-12-31 DIAGNOSIS — Z9181 History of falling: Secondary | ICD-10-CM | POA: Diagnosis not present

## 2018-12-31 DIAGNOSIS — M170 Bilateral primary osteoarthritis of knee: Secondary | ICD-10-CM | POA: Diagnosis not present

## 2018-12-31 DIAGNOSIS — Z79891 Long term (current) use of opiate analgesic: Secondary | ICD-10-CM | POA: Diagnosis not present

## 2018-12-31 DIAGNOSIS — Z951 Presence of aortocoronary bypass graft: Secondary | ICD-10-CM | POA: Diagnosis not present

## 2018-12-31 DIAGNOSIS — S72141D Displaced intertrochanteric fracture of right femur, subsequent encounter for closed fracture with routine healing: Secondary | ICD-10-CM | POA: Diagnosis not present

## 2018-12-31 DIAGNOSIS — H9193 Unspecified hearing loss, bilateral: Secondary | ICD-10-CM | POA: Diagnosis not present

## 2018-12-31 DIAGNOSIS — Z7901 Long term (current) use of anticoagulants: Secondary | ICD-10-CM | POA: Diagnosis not present

## 2019-01-01 ENCOUNTER — Other Ambulatory Visit: Payer: Self-pay

## 2019-01-01 ENCOUNTER — Encounter (INDEPENDENT_AMBULATORY_CARE_PROVIDER_SITE_OTHER): Payer: Medicare Other | Admitting: Ophthalmology

## 2019-01-01 DIAGNOSIS — H43813 Vitreous degeneration, bilateral: Secondary | ICD-10-CM

## 2019-01-01 DIAGNOSIS — H353231 Exudative age-related macular degeneration, bilateral, with active choroidal neovascularization: Secondary | ICD-10-CM | POA: Diagnosis not present

## 2019-01-04 DIAGNOSIS — S72141A Displaced intertrochanteric fracture of right femur, initial encounter for closed fracture: Secondary | ICD-10-CM | POA: Diagnosis not present

## 2019-01-09 DIAGNOSIS — S7221XD Displaced subtrochanteric fracture of right femur, subsequent encounter for closed fracture with routine healing: Secondary | ICD-10-CM | POA: Diagnosis not present

## 2019-01-28 ENCOUNTER — Other Ambulatory Visit: Payer: Self-pay | Admitting: Cardiovascular Disease

## 2019-02-04 ENCOUNTER — Other Ambulatory Visit: Payer: Self-pay | Admitting: Cardiovascular Disease

## 2019-02-04 DIAGNOSIS — S72141A Displaced intertrochanteric fracture of right femur, initial encounter for closed fracture: Secondary | ICD-10-CM | POA: Diagnosis not present

## 2019-02-04 MED ORDER — ATENOLOL 25 MG PO TABS: 25.0000 mg | ORAL_TABLET | Freq: Every day | ORAL | 2 refills | Status: DC

## 2019-03-07 DIAGNOSIS — S72141A Displaced intertrochanteric fracture of right femur, initial encounter for closed fracture: Secondary | ICD-10-CM | POA: Diagnosis not present

## 2019-03-29 ENCOUNTER — Other Ambulatory Visit: Payer: Self-pay | Admitting: Cardiovascular Disease

## 2019-03-29 MED ORDER — RIVAROXABAN 15 MG PO TABS: 15.0000 mg | ORAL_TABLET | Freq: Every day | ORAL | 6 refills | Status: DC

## 2019-03-29 NOTE — Telephone Encounter (Signed)
New message  Pt c/o medication issue:  1. Name of Medication: Rivaroxaban (XARELTO) 15 MG TABS tablet  2. How are you currently taking this medication (dosage and times per day)? As written  3. Are you having a reaction (difficulty breathing--STAT)? No   4. What is your medication issue?per patient's niece need new prescription sent to [facility] on [facility] [address]

## 2019-03-29 NOTE — Telephone Encounter (Signed)
Xarelto 15mg  refill request received. Pt is 86 years old, weight-70.3 kg, Crea-0.88 on 11/23/2018, last seen by Dr. 11/25/2018 on 10/05/2018, Diagnosis-Afib, CrCl-49.76ml/min; will send in the refill to requested pharmacy.

## 2019-04-04 DIAGNOSIS — S72141A Displaced intertrochanteric fracture of right femur, initial encounter for closed fracture: Secondary | ICD-10-CM | POA: Diagnosis not present

## 2019-04-09 ENCOUNTER — Encounter (INDEPENDENT_AMBULATORY_CARE_PROVIDER_SITE_OTHER): Payer: Medicare Other | Admitting: Ophthalmology

## 2019-04-10 ENCOUNTER — Encounter (INDEPENDENT_AMBULATORY_CARE_PROVIDER_SITE_OTHER): Payer: Medicare Other | Admitting: Ophthalmology

## 2019-04-10 DIAGNOSIS — H353231 Exudative age-related macular degeneration, bilateral, with active choroidal neovascularization: Secondary | ICD-10-CM | POA: Diagnosis not present

## 2019-04-10 DIAGNOSIS — H43813 Vitreous degeneration, bilateral: Secondary | ICD-10-CM

## 2019-05-05 DIAGNOSIS — S72141A Displaced intertrochanteric fracture of right femur, initial encounter for closed fracture: Secondary | ICD-10-CM | POA: Diagnosis not present

## 2019-06-04 DIAGNOSIS — S72141A Displaced intertrochanteric fracture of right femur, initial encounter for closed fracture: Secondary | ICD-10-CM | POA: Diagnosis not present

## 2019-07-05 DIAGNOSIS — S72141A Displaced intertrochanteric fracture of right femur, initial encounter for closed fracture: Secondary | ICD-10-CM | POA: Diagnosis not present

## 2019-07-24 ENCOUNTER — Other Ambulatory Visit: Payer: Self-pay

## 2019-07-24 ENCOUNTER — Encounter (INDEPENDENT_AMBULATORY_CARE_PROVIDER_SITE_OTHER): Payer: Medicare Other | Admitting: Ophthalmology

## 2019-07-24 DIAGNOSIS — H43813 Vitreous degeneration, bilateral: Secondary | ICD-10-CM

## 2019-07-24 DIAGNOSIS — H33301 Unspecified retinal break, right eye: Secondary | ICD-10-CM | POA: Diagnosis not present

## 2019-07-24 DIAGNOSIS — H353231 Exudative age-related macular degeneration, bilateral, with active choroidal neovascularization: Secondary | ICD-10-CM

## 2019-08-04 DIAGNOSIS — S72141A Displaced intertrochanteric fracture of right femur, initial encounter for closed fracture: Secondary | ICD-10-CM | POA: Diagnosis not present

## 2019-10-01 ENCOUNTER — Encounter: Payer: Self-pay | Admitting: Cardiovascular Disease

## 2019-10-01 NOTE — Progress Notes (Signed)
Cardiology Office Note   Date:  10/02/2019   ID:  Ellen Barajas, DOB 07-13-1928, MRN [id number]  PCP:  Ellen Bush, MD  Cardiologist: Ellen Coco MD --->  Ellen Barajas   Chief Complaint  Patient presents with  . Coronary Artery Disease  . Atrial Fibrillation   Problem list 1. Coronary artery disease-status post coronary artery bypass grafting 2. Atrial fibrillation - persistent  3. Hyperlipidemia    Previous notes from Ellen Barajas is a 86 y.o. female who presents for scheduled follow-up visit  This pleasant 86 year old woman is seen for a scheduled four-month followup office visit. She has a history of ischemic heart disease. She has had previous CABG. [name] been in chronic atrial fibrillation. She underwent cardioversion on 10/23/09 but 1 week later was back in atrial fibrillation. We have made no further attempts at cardioversion. She is essentially asymptomatic with her rhythm. She has a history of hypercholesterolemia and is on over-the-counter preparations. We are checking lipids today. Since last visit she has had no new cardiac symptoms. She remains on long-term Coumadin. Her post herpetic neuralgia pain is improved. She has osteoarthritis of the knees. She does not take anything for it. I suggested that she try some generic Tylenol Since her last visit she has not been experiencing any TIA or stroke symptoms.  No increased exertional dyspnea.  No chest pain.  Nov. 13, 2017:  Ellen Barajas is seen today for initial visit .   Seen with husband, Ellen Barajas.   Ellen Barajas's older sister.  No CP or dyspnea . Is in chronic atrial fib.   She is completely asymptomatic.     Jun 13, 2016:    Seen back today for follow up HTN BP is elevated.   December 13, 2016: Ms. Ellen Barajas is seen back today for follow-up visit of her coronary artery disease, hypertension, atrial fibrillation. No CP or dyspnea Having some eye issues.   Getting laser surgery   July 26, 2017:  Ellen Barajas is   seen today for a follow-up visit.  Has a history of coronary artery disease and is status post coronary artery bypass grafting.  History of atrial fibrillation and is on warfarin. She is taking care of her husband at [facility].   Plans on going home when she can  Doing great.    February 06, 2018: Seen with Ellen Barajas, Niece. She is Ellen Barajas 's sister .  This is seen today for follow-up of her coronary artery disease and coronary artery bypass grafting.  She also has permanent atrial fibrillation.  She fell and broke her hip this past September.  She had surgical repair and seems to be getting along quite well.  Her husband has passed away since I last saw Doing well from a cardiac standpoint.    No CP or dyspena  She wants to change to a DOAC instead of warfarin    Sept. 11, 2020  Last Weight  Most recent update: 10/02/2019  8:21 AM   Weight  70.7 kg (155 lb 12.8 oz)           Feeling well.     Seen with Ellen Barajas ( Niece)  Still in Afib.  Avoids salt .   Active around her house.   Does some gardening  Doing well on the Xarelto 15 mg   Sept. 8, 2021:  [name] is seen for follow up of her cad, atrial fib. htn Seen with Ellen Barajas ( niece)  Stays active.  Walks regularly  No CP   Past Medical History:  Diagnosis Date  . AF (atrial fibrillation) (HCC)    coumadin  . Arthritis    "hands" (09/27/2017)  . Coronary artery disease 2002   s/p CABG  . Diverticulosis   . Dyslipidemia   . History of chicken pox   . IHD (ischemic heart disease)   . Myocardial infarction ([place]) ~ 2002  . Post herpetic neuralgia 02/2012    Past Surgical History:  Procedure Laterality Date  . BREAST LUMPECTOMY Left 1992   excision of fibrous tumor/notes 06/19/2000  . CARDIAC CATHETERIZATION  06/08/2000  . CATARACT EXTRACTION W/ INTRAOCULAR LENS  IMPLANT, BILATERAL Bilateral 2013  . CORONARY ARTERY BYPASS GRAFT  2002   x 4  . EYE SURGERY Bilateral 1989  . FEMUR IM NAIL Left 09/28/2017   Procedure:  INTRAMEDULLARY (IM) NAIL FEMORAL;  Surgeon: Ellen Butters, MD;  Location: [facility];  Service: Orthopedics;  Laterality: Left;  . INTRAMEDULLARY (IM) NAIL INTERTROCHANTERIC Right 11/22/2018   Procedure: INTRAMEDULLARY (IM) NAIL INTERTROCHANTRIC;  Surgeon: Ellen Butters, MD;  Location: [facility];  Service: Orthopedics;  Laterality: Right;     Current Outpatient Medications  Medication Sig Dispense Refill  . atenolol (TENORMIN) 25 MG tablet Take 1 tablet (25 mg total) by mouth daily. 90 tablet 2  . losartan (COZAAR) 25 MG tablet TAKE 1 TABLET BY MOUTH ONCE DAILY 90 tablet 2  . polyethylene glycol (MIRALAX / GLYCOLAX) 17 g packet Take 17 g by mouth daily as needed for mild constipation. 6 each 0  . Rivaroxaban (XARELTO) 15 MG TABS tablet Take 1 tablet (15 mg total) by mouth daily with supper. 30 tablet 6   No current facility-administered medications for this visit.    Allergies:   Crestor [rosuvastatin calcium], Lipitor [atorvastatin calcium], Niaspan [niacin er], Other, Pravastatin, Ramipril [ramipril], Welchol [colesevelam hcl], and Zetia [ezetimibe]    Social History:  The patient  reports that she has never smoked. She has never used smokeless tobacco. She reports that she does not drink alcohol and does not use drugs.   Family History:  The patient's family history includes CAD (age of onset: 61) in her father; CAD (age of onset: 69) in her mother.    ROS:  Please see the history of present illness.   Otherwise, review of systems are positive for none.   All other systems are reviewed and negative.  Physical Exam: Blood pressure (!) 162/66, pulse 88, height 5' 3" (1.6 m), weight 155 lb 12.8 oz (70.7 kg), SpO2 97 %.  GEN:  Elderly female, NAD  HEENT: Normal NECK: No JVD; No carotid bruits LYMPHATICS: No lymphadenopathy CARDIAC:  Irreg. Ireg. , 2/6 systolic murur at lst radiating to RSB  RESPIRATORY:  Clear to auscultation without rales, wheezing or rhonchi  ABDOMEN: Soft,  non-tender, non-distended MUSCULOSKELETAL:  No edema; No deformity  SKIN: Warm and dry NEUROLOGIC:  Alert and oriented x 3    EKG: Marland Kitchen  October 02, 2019: Atrial fibrillation with a controlled ventricular rate.  He has nonspecific ST and T wave changes.   Recent Labs: 11/24/2018: BUN 22; Creatinine, Ser 0.82; Hemoglobin 12.7; Platelets 165; Potassium 4.5; Sodium 138    Lipid Panel    Component Value Date/Time   CHOL 211 (H) 02/06/2018 1036   TRIG 118 02/06/2018 1036   HDL 63 02/06/2018 1036   CHOLHDL 3.3 02/06/2018 1036   CHOLHDL 3 09/01/2016 1102   VLDL 12.8 09/01/2016 1102   LDLCALC  124 (H) 02/06/2018 1036   LDLDIRECT 147.5 12/17/2012 0920      Wt Readings from Last 3 Encounters:  10/02/19 155 lb 12.8 oz (70.7 kg)  11/22/18 154 lb 15.7 oz (70.3 kg)  10/05/18 155 lb (70.3 kg)        ASSESSMENT AND PLAN:  1. Ischemic heart disease status post CABG -  No angina She has developed some ST/ T abn. But has no symptoms of dyspnea or CP  I suspect she has some LV strain from AS.      2. permanent atrial fibrillation-     Cont. xarelto 15 mg a day   3. Hypercholesterolemia-     Cont current meds.   Will check labs at next visit    4.  HTN:    BP was slightly elevated this am but came down after calming down and sitting / resting for a few minutes.     Mertie Moores, MD  10/02/2019 8:41 AM    [place],  [place], [place]  [address] Pager [phone] Phone: [phone]; Fax: [phone]

## 2019-10-02 ENCOUNTER — Ambulatory Visit: Payer: Medicare Other | Admitting: Cardiovascular Disease

## 2019-10-02 ENCOUNTER — Encounter: Payer: Self-pay | Admitting: Cardiovascular Disease

## 2019-10-02 ENCOUNTER — Other Ambulatory Visit: Payer: Self-pay

## 2019-10-02 VITALS — BP 135/65 | HR 88 | Ht 63.0 in | Wt 155.8 lb

## 2019-10-02 DIAGNOSIS — I482 Chronic atrial fibrillation, unspecified: Secondary | ICD-10-CM | POA: Diagnosis not present

## 2019-10-02 DIAGNOSIS — I480 Paroxysmal atrial fibrillation: Secondary | ICD-10-CM

## 2019-10-02 DIAGNOSIS — I2510 Atherosclerotic heart disease of native coronary artery without angina pectoris: Secondary | ICD-10-CM | POA: Diagnosis not present

## 2019-10-02 DIAGNOSIS — I350 Nonrheumatic aortic (valve) stenosis: Secondary | ICD-10-CM | POA: Diagnosis not present

## 2019-10-02 DIAGNOSIS — Z951 Presence of aortocoronary bypass graft: Secondary | ICD-10-CM

## 2019-10-02 DIAGNOSIS — I351 Nonrheumatic aortic (valve) insufficiency: Secondary | ICD-10-CM | POA: Diagnosis not present

## 2019-10-02 DIAGNOSIS — I4811 Longstanding persistent atrial fibrillation: Secondary | ICD-10-CM

## 2019-10-02 NOTE — Patient Instructions (Signed)
Medication Instructions:  Your physician recommends that you continue on your current medications as directed. Please refer to the Current Medication list given to you today.  *If you need a refill on your cardiac medications before your next appointment, please call your pharmacy*   Lab Work: None  If you have labs (blood work) drawn today and your tests are completely normal, you will receive your results only by: Marland Kitchen MyChart Message (if you have MyChart) OR . A paper copy in the mail If you have any lab test that is abnormal or we need to change your treatment, we will call you to review the results.   Testing/Procedures: Your physician has requested that you have an echocardiogram. Echocardiography is a painless test that uses sound waves to create images of your heart. It provides your doctor with information about the size and shape of your heart and how well your heart's chambers and valves are working. This procedure takes approximately one hour. There are no restrictions for this procedure.  Follow-Up: At [facility], you and your health needs are our priority.  As part of our continuing mission to provide you with exceptional heart care, we have created designated Provider Care Teams.  These Care Teams include your primary Cardiologist (physician) and Advanced Practice Providers (APPs -  Physician Assistants and Nurse Practitioners) who all work together to provide you with the care you need, when you need it.  We recommend signing up for the patient portal called "MyChart".  Sign up information is provided on this After Visit Summary.  MyChart is used to connect with patients for Virtual Visits (Telemedicine).  Patients are able to view lab/test results, encounter notes, upcoming appointments, etc.  Non-urgent messages can be sent to your provider as well.   To learn more about what you can do with MyChart, go to [email or web address].    Your next appointment:   12  month(s)  The format for your next appointment:   In Person  Provider:   You may see Kristeen Miss, MD or one of the following Advanced Practice Providers on your designated Care Team:    Ronie Spies, PA-C  Jacolyn Reedy, PA-C    Other Instructions None

## 2019-10-08 ENCOUNTER — Other Ambulatory Visit: Payer: Self-pay | Admitting: Cardiovascular Disease

## 2019-10-14 ENCOUNTER — Ambulatory Visit (HOSPITAL_COMMUNITY): Payer: Medicare Other | Attending: Cardiology

## 2019-10-14 ENCOUNTER — Other Ambulatory Visit: Payer: Self-pay

## 2019-10-14 DIAGNOSIS — I351 Nonrheumatic aortic (valve) insufficiency: Secondary | ICD-10-CM | POA: Diagnosis not present

## 2019-10-14 DIAGNOSIS — I2510 Atherosclerotic heart disease of native coronary artery without angina pectoris: Secondary | ICD-10-CM | POA: Diagnosis not present

## 2019-10-14 DIAGNOSIS — I350 Nonrheumatic aortic (valve) stenosis: Secondary | ICD-10-CM | POA: Diagnosis not present

## 2019-10-14 DIAGNOSIS — I482 Chronic atrial fibrillation, unspecified: Secondary | ICD-10-CM

## 2019-10-14 LAB — ECHOCARDIOGRAM COMPLETE
AR max vel: 0.76 cm2
AV Area VTI: 0.76 cm2
AV Area mean vel: 0.75 cm2
AV Mean grad: 17.2 mmHg
AV Peak grad: 27.4 mmHg
Ao pk vel: 2.62 m/s
P 1/2 time: 444 msec
S' Lateral: 3.4 cm

## 2019-11-06 ENCOUNTER — Other Ambulatory Visit: Payer: Self-pay

## 2019-11-06 ENCOUNTER — Encounter (INDEPENDENT_AMBULATORY_CARE_PROVIDER_SITE_OTHER): Payer: Medicare Other | Admitting: Ophthalmology

## 2019-11-06 DIAGNOSIS — H33301 Unspecified retinal break, right eye: Secondary | ICD-10-CM

## 2019-11-06 DIAGNOSIS — H353231 Exudative age-related macular degeneration, bilateral, with active choroidal neovascularization: Secondary | ICD-10-CM

## 2019-11-06 DIAGNOSIS — H43813 Vitreous degeneration, bilateral: Secondary | ICD-10-CM | POA: Diagnosis not present

## 2019-11-18 ENCOUNTER — Other Ambulatory Visit: Payer: Self-pay | Admitting: Cardiovascular Disease

## 2019-11-18 DIAGNOSIS — I480 Paroxysmal atrial fibrillation: Secondary | ICD-10-CM

## 2019-11-18 MED ORDER — RIVAROXABAN 15 MG PO TABS: 15.0000 mg | ORAL_TABLET | Freq: Every day | ORAL | 0 refills | Status: DC

## 2019-11-18 NOTE — Telephone Encounter (Addendum)
Prescription refill request for Xarelto received.   HX: afibb Last office visit: [place], 10/02/2019 Weight: 70.7 kg Age: 86 yo Scr: 0.82 , 11/24/2018 CrCl: 27ml/min   Pt's has had a weight increase and qualifies for a dose increase of Xarelto. Pt is also due for blood work. Spoke with Royston Sinner D, okay to send in a 1 month supply of Xarelto 15mg  daily,  no refills. Will get updated blood work and will make dosing decision based on updated blood work.   Called pt and LMOM, requesting to give coumadin clinic, with Dr. office,  a call to get updated blood work.   Called [facility] and cancelled prescription that was previously sent in for Xarelto 15mg  daily, 30 tabs,  (5 refills) and phone in one for Xarelto 15mg , daily, 30 tabs, no refills. So pt wont run out of medication.   Called and spoke to pt's niece Harvie Bridge and scheduled pt to come in on 11/3 to get updated blood work.

## 2019-11-18 NOTE — Addendum Note (Signed)
Addended by: Mellody Dance B on: 11/18/2019 09:42 AM   Modules accepted: Orders

## 2019-11-27 ENCOUNTER — Other Ambulatory Visit: Payer: Self-pay

## 2019-11-27 ENCOUNTER — Other Ambulatory Visit: Payer: Self-pay | Admitting: Cardiovascular Disease

## 2019-11-27 ENCOUNTER — Other Ambulatory Visit: Payer: Medicare Other | Admitting: *Deleted

## 2019-11-27 DIAGNOSIS — I480 Paroxysmal atrial fibrillation: Secondary | ICD-10-CM

## 2019-11-27 LAB — CBC
Hematocrit: 42.9 % (ref 34.0–46.6)
Hemoglobin: 13.7 g/dL (ref 11.1–15.9)
MCH: 28.2 pg (ref 26.6–33.0)
MCHC: 31.9 g/dL (ref 31.5–35.7)
MCV: 89 fL (ref 79–97)
Platelets: 264 10*3/uL (ref 150–450)
RBC: 4.85 x10E6/uL (ref 3.77–5.28)
RDW: 12.6 % (ref 11.7–15.4)
WBC: 6.5 10*3/uL (ref 3.4–10.8)

## 2019-11-27 LAB — BASIC METABOLIC PANEL
BUN/Creatinine Ratio: 21 (ref 12–28)
BUN: 19 mg/dL (ref 8–27)
CO2: 25 mmol/L (ref 20–29)
Calcium: 9.5 mg/dL (ref 8.7–10.3)
Chloride: 103 mmol/L (ref 96–106)
Creatinine, Ser: 0.91 mg/dL (ref 0.57–1.00)
GFR calc Af Amer: 65 mL/min/{1.73_m2} (ref >59)
GFR calc non Af Amer: 56 mL/min/{1.73_m2} — ABNORMAL LOW (ref >59)
Glucose: 98 mg/dL (ref 65–99)
Potassium: 4.6 mmol/L (ref 3.5–5.2)
Sodium: 140 mmol/L (ref 134–144)

## 2019-11-28 ENCOUNTER — Telehealth: Payer: Self-pay | Admitting: *Deleted

## 2019-11-28 MED ORDER — RIVAROXABAN 15 MG PO TABS: 15.0000 mg | ORAL_TABLET | Freq: Every day | ORAL | 5 refills | Status: DC

## 2019-11-28 NOTE — Telephone Encounter (Signed)
Prescription refill request received for Xarelto on 10/25 ( see refill encounter for further documentation). Pt was due for blood work and got blood work on 11/3. Based on the recent blood work and dosing criteria pt is on the appropriate dose of Xarelto.   Lov: N[place] 10/02/2019 Scr: 0.91, 11/27/2019 Weight: 70.7 kg  CrCl: 46 ml/min    Called and spoke to [place] (on [place]) and updated her making her aware the pt was going to continue to take Xarelto 15mg  daily.   Prescription refill sent for Xarelto 15mg  daily.

## 2020-02-17 ENCOUNTER — Encounter (INDEPENDENT_AMBULATORY_CARE_PROVIDER_SITE_OTHER): Payer: Medicare Other | Admitting: Ophthalmology

## 2020-02-17 ENCOUNTER — Other Ambulatory Visit: Payer: Self-pay

## 2020-02-17 DIAGNOSIS — H353231 Exudative age-related macular degeneration, bilateral, with active choroidal neovascularization: Secondary | ICD-10-CM

## 2020-02-17 DIAGNOSIS — H43813 Vitreous degeneration, bilateral: Secondary | ICD-10-CM | POA: Diagnosis not present
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
## 2020-06-01 ENCOUNTER — Other Ambulatory Visit: Payer: Self-pay

## 2020-06-01 ENCOUNTER — Encounter (INDEPENDENT_AMBULATORY_CARE_PROVIDER_SITE_OTHER): Payer: Medicare Other | Admitting: Ophthalmology

## 2020-06-01 DIAGNOSIS — H43813 Vitreous degeneration, bilateral: Secondary | ICD-10-CM

## 2020-06-01 DIAGNOSIS — H353231 Exudative age-related macular degeneration, bilateral, with active choroidal neovascularization: Secondary | ICD-10-CM

## 2020-06-09 ENCOUNTER — Telehealth: Payer: Self-pay

## 2020-06-09 NOTE — Telephone Encounter (Signed)
Ok to schedule.

## 2020-06-09 NOTE — Telephone Encounter (Signed)
Niece [name] called wanting to schedule an appt for pt as she is concerned about increased confusion and requesting labs... pt was last seen almost 4 years ago and I need to verify with you if okay to schedule

## 2020-06-09 NOTE — Telephone Encounter (Signed)
Patient is scheduled for 5/23

## 2020-06-15 ENCOUNTER — Ambulatory Visit: Payer: Medicare Other | Admitting: Family Medicine

## 2020-07-14 ENCOUNTER — Telehealth: Payer: Self-pay | Admitting: Cardiovascular Disease

## 2020-07-14 NOTE — Telephone Encounter (Signed)
New Message:     Does pt need lab work before her yearly appt in September?

## 2020-07-14 NOTE — Telephone Encounter (Signed)
Returned call to patient's niece per DPR. She called to ask if patient needs lab work prior to appointment with Dr. Elease Hashimoto in September. She states it is difficult to get her aunt in for appointments and is agreeable for lab work to be done at the time of the appointment. She thanked me for the help.

## 2020-07-31 ENCOUNTER — Encounter (HOSPITAL_COMMUNITY): Payer: Self-pay | Admitting: *Deleted

## 2020-07-31 ENCOUNTER — Emergency Department (HOSPITAL_COMMUNITY): Payer: Medicare Other

## 2020-07-31 ENCOUNTER — Inpatient Hospital Stay (HOSPITAL_COMMUNITY)
Admission: EM | Admit: 2020-07-31 | Discharge: 2020-08-05 | DRG: 280 | Disposition: A | Discharge status: Rehabilitation Facility | Payer: Medicare Other | Attending: Internal Medicine | Admitting: Internal Medicine

## 2020-07-31 ENCOUNTER — Other Ambulatory Visit: Payer: Self-pay

## 2020-07-31 DIAGNOSIS — I671 Cerebral aneurysm, nonruptured: Secondary | ICD-10-CM | POA: Diagnosis present

## 2020-07-31 DIAGNOSIS — I5021 Acute systolic (congestive) heart failure: Secondary | ICD-10-CM | POA: Diagnosis present

## 2020-07-31 DIAGNOSIS — Z888 Allergy status to other drugs, medicaments and biological substances status: Secondary | ICD-10-CM | POA: Diagnosis not present

## 2020-07-31 DIAGNOSIS — I083 Combined rheumatic disorders of mitral, aortic and tricuspid valves: Secondary | ICD-10-CM | POA: Diagnosis not present

## 2020-07-31 DIAGNOSIS — H1131 Conjunctival hemorrhage, right eye: Secondary | ICD-10-CM | POA: Diagnosis present

## 2020-07-31 DIAGNOSIS — S0240EA Zygomatic fracture, right side, initial encounter for closed fracture: Secondary | ICD-10-CM | POA: Diagnosis not present

## 2020-07-31 DIAGNOSIS — I4811 Longstanding persistent atrial fibrillation: Secondary | ICD-10-CM | POA: Diagnosis not present

## 2020-07-31 DIAGNOSIS — R7401 Elevation of levels of liver transaminase levels: Secondary | ICD-10-CM | POA: Diagnosis not present

## 2020-07-31 DIAGNOSIS — M6282 Rhabdomyolysis: Secondary | ICD-10-CM | POA: Diagnosis present

## 2020-07-31 DIAGNOSIS — R55 Syncope and collapse: Secondary | ICD-10-CM | POA: Diagnosis present

## 2020-07-31 DIAGNOSIS — Z20822 Contact with and (suspected) exposure to covid-19: Secondary | ICD-10-CM | POA: Diagnosis not present

## 2020-07-31 DIAGNOSIS — R482 Apraxia: Secondary | ICD-10-CM | POA: Diagnosis not present

## 2020-07-31 DIAGNOSIS — I69391 Dysphagia following cerebral infarction: Secondary | ICD-10-CM | POA: Diagnosis not present

## 2020-07-31 DIAGNOSIS — E785 Hyperlipidemia, unspecified: Secondary | ICD-10-CM | POA: Diagnosis not present

## 2020-07-31 DIAGNOSIS — I672 Cerebral atherosclerosis: Secondary | ICD-10-CM | POA: Diagnosis present

## 2020-07-31 DIAGNOSIS — H9190 Unspecified hearing loss, unspecified ear: Secondary | ICD-10-CM | POA: Diagnosis present

## 2020-07-31 DIAGNOSIS — E119 Type 2 diabetes mellitus without complications: Secondary | ICD-10-CM | POA: Diagnosis present

## 2020-07-31 DIAGNOSIS — I255 Ischemic cardiomyopathy: Secondary | ICD-10-CM | POA: Diagnosis present

## 2020-07-31 DIAGNOSIS — I2510 Atherosclerotic heart disease of native coronary artery without angina pectoris: Secondary | ICD-10-CM | POA: Diagnosis not present

## 2020-07-31 DIAGNOSIS — I214 Non-ST elevation (NSTEMI) myocardial infarction: Secondary | ICD-10-CM | POA: Diagnosis not present

## 2020-07-31 DIAGNOSIS — I6340 Cerebral infarction due to embolism of unspecified cerebral artery: Secondary | ICD-10-CM | POA: Insufficient documentation

## 2020-07-31 DIAGNOSIS — Z961 Presence of intraocular lens: Secondary | ICD-10-CM | POA: Diagnosis present

## 2020-07-31 DIAGNOSIS — I63412 Cerebral infarction due to embolism of left middle cerebral artery: Secondary | ICD-10-CM | POA: Diagnosis not present

## 2020-07-31 DIAGNOSIS — I69322 Dysarthria following cerebral infarction: Secondary | ICD-10-CM | POA: Diagnosis not present

## 2020-07-31 DIAGNOSIS — Z66 Do not resuscitate: Secondary | ICD-10-CM | POA: Diagnosis present

## 2020-07-31 DIAGNOSIS — I63422 Cerebral infarction due to embolism of left anterior cerebral artery: Secondary | ICD-10-CM | POA: Diagnosis present

## 2020-07-31 DIAGNOSIS — S0240CA Maxillary fracture, right side, initial encounter for closed fracture: Secondary | ICD-10-CM | POA: Diagnosis not present

## 2020-07-31 DIAGNOSIS — I259 Chronic ischemic heart disease, unspecified: Secondary | ICD-10-CM | POA: Diagnosis not present

## 2020-07-31 DIAGNOSIS — W19XXXA Unspecified fall, initial encounter: Secondary | ICD-10-CM | POA: Diagnosis present

## 2020-07-31 DIAGNOSIS — Z79899 Other long term (current) drug therapy: Secondary | ICD-10-CM | POA: Diagnosis not present

## 2020-07-31 DIAGNOSIS — M47812 Spondylosis without myelopathy or radiculopathy, cervical region: Secondary | ICD-10-CM | POA: Diagnosis not present

## 2020-07-31 DIAGNOSIS — I110 Hypertensive heart disease with heart failure: Secondary | ICD-10-CM | POA: Diagnosis not present

## 2020-07-31 DIAGNOSIS — R531 Weakness: Secondary | ICD-10-CM | POA: Diagnosis not present

## 2020-07-31 DIAGNOSIS — Z9841 Cataract extraction status, right eye: Secondary | ICD-10-CM

## 2020-07-31 DIAGNOSIS — R1310 Dysphagia, unspecified: Secondary | ICD-10-CM | POA: Diagnosis present

## 2020-07-31 DIAGNOSIS — R4701 Aphasia: Secondary | ICD-10-CM | POA: Diagnosis not present

## 2020-07-31 DIAGNOSIS — M47816 Spondylosis without myelopathy or radiculopathy, lumbar region: Secondary | ICD-10-CM | POA: Diagnosis not present

## 2020-07-31 DIAGNOSIS — Z951 Presence of aortocoronary bypass graft: Secondary | ICD-10-CM | POA: Diagnosis not present

## 2020-07-31 DIAGNOSIS — I482 Chronic atrial fibrillation, unspecified: Secondary | ICD-10-CM | POA: Diagnosis not present

## 2020-07-31 DIAGNOSIS — I6621 Occlusion and stenosis of right posterior cerebral artery: Secondary | ICD-10-CM | POA: Diagnosis not present

## 2020-07-31 DIAGNOSIS — I69320 Aphasia following cerebral infarction: Secondary | ICD-10-CM | POA: Diagnosis not present

## 2020-07-31 DIAGNOSIS — S0292XA Unspecified fracture of facial bones, initial encounter for closed fracture: Secondary | ICD-10-CM

## 2020-07-31 DIAGNOSIS — Z9842 Cataract extraction status, left eye: Secondary | ICD-10-CM

## 2020-07-31 DIAGNOSIS — I639 Cerebral infarction, unspecified: Secondary | ICD-10-CM | POA: Diagnosis not present

## 2020-07-31 DIAGNOSIS — Z7901 Long term (current) use of anticoagulants: Secondary | ICD-10-CM | POA: Diagnosis not present

## 2020-07-31 DIAGNOSIS — R0902 Hypoxemia: Secondary | ICD-10-CM | POA: Diagnosis not present

## 2020-07-31 DIAGNOSIS — Z7189 Other specified counseling: Secondary | ICD-10-CM | POA: Diagnosis not present

## 2020-07-31 DIAGNOSIS — G8191 Hemiplegia, unspecified affecting right dominant side: Secondary | ICD-10-CM | POA: Diagnosis not present

## 2020-07-31 DIAGNOSIS — I69351 Hemiplegia and hemiparesis following cerebral infarction affecting right dominant side: Secondary | ICD-10-CM | POA: Diagnosis not present

## 2020-07-31 DIAGNOSIS — R471 Dysarthria and anarthria: Secondary | ICD-10-CM | POA: Diagnosis present

## 2020-07-31 DIAGNOSIS — R29810 Facial weakness: Secondary | ICD-10-CM | POA: Diagnosis not present

## 2020-07-31 DIAGNOSIS — I10 Essential (primary) hypertension: Secondary | ICD-10-CM | POA: Diagnosis not present

## 2020-07-31 DIAGNOSIS — Z8249 Family history of ischemic heart disease and other diseases of the circulatory system: Secondary | ICD-10-CM | POA: Diagnosis not present

## 2020-07-31 DIAGNOSIS — I4819 Other persistent atrial fibrillation: Secondary | ICD-10-CM | POA: Diagnosis not present

## 2020-07-31 DIAGNOSIS — I69390 Apraxia following cerebral infarction: Secondary | ICD-10-CM | POA: Diagnosis not present

## 2020-07-31 DIAGNOSIS — I517 Cardiomegaly: Secondary | ICD-10-CM | POA: Diagnosis not present

## 2020-07-31 DIAGNOSIS — I4891 Unspecified atrial fibrillation: Secondary | ICD-10-CM | POA: Diagnosis not present

## 2020-07-31 DIAGNOSIS — R410 Disorientation, unspecified: Secondary | ICD-10-CM | POA: Diagnosis not present

## 2020-07-31 DIAGNOSIS — Y92009 Unspecified place in unspecified non-institutional (private) residence as the place of occurrence of the external cause: Secondary | ICD-10-CM

## 2020-07-31 DIAGNOSIS — E876 Hypokalemia: Secondary | ICD-10-CM | POA: Diagnosis not present

## 2020-07-31 DIAGNOSIS — S0083XA Contusion of other part of head, initial encounter: Secondary | ICD-10-CM | POA: Diagnosis present

## 2020-07-31 DIAGNOSIS — Z515 Encounter for palliative care: Secondary | ICD-10-CM | POA: Diagnosis not present

## 2020-07-31 DIAGNOSIS — Z043 Encounter for examination and observation following other accident: Secondary | ICD-10-CM | POA: Diagnosis not present

## 2020-07-31 DIAGNOSIS — R29713 NIHSS score 13: Secondary | ICD-10-CM | POA: Diagnosis present

## 2020-07-31 DIAGNOSIS — I4821 Permanent atrial fibrillation: Secondary | ICD-10-CM | POA: Diagnosis not present

## 2020-07-31 DIAGNOSIS — I6612 Occlusion and stenosis of left anterior cerebral artery: Secondary | ICD-10-CM | POA: Diagnosis not present

## 2020-07-31 DIAGNOSIS — I5022 Chronic systolic (congestive) heart failure: Secondary | ICD-10-CM | POA: Diagnosis not present

## 2020-07-31 DIAGNOSIS — I252 Old myocardial infarction: Secondary | ICD-10-CM

## 2020-07-31 LAB — CBC WITH DIFFERENTIAL/PLATELET
Abs Immature Granulocytes: 0.02 10*3/uL (ref 0.00–0.07)
Basophils Absolute: 0 10*3/uL (ref 0.0–0.1)
Basophils Relative: 0 %
Eosinophils Absolute: 0 10*3/uL (ref 0.0–0.5)
Eosinophils Relative: 0 %
HCT: 43.6 % (ref 36.0–46.0)
Hemoglobin: 13.8 g/dL (ref 12.0–15.0)
Immature Granulocytes: 0 %
Lymphocytes Relative: 8 %
Lymphs Abs: 0.7 10*3/uL (ref 0.7–4.0)
MCH: 28.8 pg (ref 26.0–34.0)
MCHC: 31.7 g/dL (ref 30.0–36.0)
MCV: 90.8 fL (ref 80.0–100.0)
Monocytes Absolute: 0.6 10*3/uL (ref 0.1–1.0)
Monocytes Relative: 6 %
Neutro Abs: 8.4 10*3/uL — ABNORMAL HIGH (ref 1.7–7.7)
Neutrophils Relative %: 86 %
Platelets: 243 10*3/uL (ref 150–400)
RBC: 4.8 MIL/uL (ref 3.87–5.11)
RDW: 14.2 % (ref 11.5–15.5)
WBC: 9.7 10*3/uL (ref 4.0–10.5)
nRBC: 0 % (ref 0.0–0.2)

## 2020-07-31 LAB — COMPREHENSIVE METABOLIC PANEL
ALT: 18 U/L (ref 0–44)
AST: 50 U/L — ABNORMAL HIGH (ref 15–41)
Albumin: 3.9 g/dL (ref 3.5–5.0)
Alkaline Phosphatase: 70 U/L (ref 38–126)
Anion gap: 11 (ref 5–15)
BUN: 21 mg/dL (ref 8–23)
CO2: 23 mmol/L (ref 22–32)
Calcium: 9.2 mg/dL (ref 8.9–10.3)
Chloride: 103 mmol/L (ref 98–111)
Creatinine, Ser: 1.05 mg/dL — ABNORMAL HIGH (ref 0.44–1.00)
GFR, Estimated: 51 mL/min — ABNORMAL LOW (ref >60)
Glucose, Bld: 123 mg/dL — ABNORMAL HIGH (ref 70–99)
Potassium: 4.5 mmol/L (ref 3.5–5.1)
Sodium: 137 mmol/L (ref 135–145)
Total Bilirubin: 1.6 mg/dL — ABNORMAL HIGH (ref 0.3–1.2)
Total Protein: 6.8 g/dL (ref 6.5–8.1)

## 2020-07-31 LAB — RESP PANEL BY RT-PCR (FLU A&B, COVID) ARPGX2
Influenza A by PCR: NEGATIVE
Influenza B by PCR: NEGATIVE
SARS Coronavirus 2 by RT PCR: NEGATIVE

## 2020-07-31 LAB — PROTIME-INR
INR: 1.1 (ref 0.8–1.2)
Prothrombin Time: 14.5 seconds (ref 11.4–15.2)

## 2020-07-31 LAB — I-STAT VENOUS BLOOD GAS, ED
Acid-base deficit: 1 mmol/L (ref 0.0–2.0)
Bicarbonate: 25.1 mmol/L (ref 20.0–28.0)
Calcium, Ion: 1.18 mmol/L (ref 1.15–1.40)
HCT: 43 % (ref 36.0–46.0)
Hemoglobin: 14.6 g/dL (ref 12.0–15.0)
O2 Saturation: 89 %
Potassium: 4.4 mmol/L (ref 3.5–5.1)
Sodium: 138 mmol/L (ref 135–145)
TCO2: 26 mmol/L (ref 22–32)
pCO2, Ven: 44.6 mmHg (ref 44.0–60.0)
pH, Ven: 7.358 (ref 7.250–7.430)
pO2, Ven: 60 mmHg — ABNORMAL HIGH (ref 32.0–45.0)

## 2020-07-31 LAB — LACTIC ACID, PLASMA
Lactic Acid, Venous: 1.9 mmol/L (ref 0.5–1.9)
Lactic Acid, Venous: 2.2 mmol/L (ref 0.5–1.9)

## 2020-07-31 LAB — TROPONIN I (HIGH SENSITIVITY)
Troponin I (High Sensitivity): 3240 ng/L (ref <18)
Troponin I (High Sensitivity): 4243 ng/L (ref <18)

## 2020-07-31 LAB — BLOOD GAS, VENOUS
Acid-Base Excess: 0.7 mmol/L (ref 0.0–2.0)
Bicarbonate: 25.1 mmol/L (ref 20.0–28.0)
FIO2: 21
O2 Saturation: 32.5 %
Patient temperature: 37
pCO2, Ven: 42.8 mmHg — ABNORMAL LOW (ref 44.0–60.0)
pH, Ven: 7.386 (ref 7.250–7.430)
pO2, Ven: <31 mmHg — CL (ref 32.0–45.0)

## 2020-07-31 LAB — TYPE AND SCREEN
ABO/RH(D): O POS
Antibody Screen: NEGATIVE

## 2020-07-31 LAB — LIPASE, BLOOD: Lipase: 24 U/L (ref 11–51)

## 2020-07-31 LAB — PHOSPHORUS: Phosphorus: 4.5 mg/dL (ref 2.5–4.6)

## 2020-07-31 LAB — CK: Total CK: 731 U/L — ABNORMAL HIGH (ref 38–234)

## 2020-07-31 LAB — MAGNESIUM: Magnesium: 2.2 mg/dL (ref 1.7–2.4)

## 2020-07-31 MED ORDER — ATENOLOL 25 MG PO TABS
25.0000 mg | ORAL_TABLET | Freq: Every day | ORAL | Status: DC
  Administered 2020-07-31: 25 mg via ORAL
  Filled 2020-07-31 (×2): qty 1

## 2020-07-31 MED ORDER — CEPHALEXIN 500 MG PO CAPS
500.0000 mg | ORAL_CAPSULE | Freq: Three times a day (TID) | ORAL | Status: DC
  Administered 2020-07-31 – 2020-08-05 (×14): 500 mg via ORAL
  Filled 2020-07-31 (×16): qty 1

## 2020-07-31 MED ORDER — LOSARTAN POTASSIUM 25 MG PO TABS
25.0000 mg | ORAL_TABLET | Freq: Every day | ORAL | Status: DC
  Administered 2020-07-31: 25 mg via ORAL
  Filled 2020-07-31: qty 1

## 2020-07-31 MED ORDER — LACTATED RINGERS IV BOLUS
250.0000 mL | Freq: Once | INTRAVENOUS | Status: AC
  Administered 2020-07-31: 250 mL via INTRAVENOUS

## 2020-07-31 MED ORDER — LACTATED RINGERS IV SOLN: INTRAVENOUS | Status: DC

## 2020-07-31 MED ORDER — DILTIAZEM HCL 25 MG/5ML IV SOLN
10.0000 mg | Freq: Once | INTRAVENOUS | Status: AC
  Administered 2020-07-31: 10 mg via INTRAVENOUS
  Filled 2020-07-31: qty 5

## 2020-07-31 MED ORDER — RIVAROXABAN 15 MG PO TABS
15.0000 mg | ORAL_TABLET | Freq: Every day | ORAL | Status: DC
  Administered 2020-07-31: 15 mg via ORAL
  Filled 2020-07-31: qty 1

## 2020-07-31 MED ORDER — LACTATED RINGERS IV SOLN: INTRAVENOUS | Status: AC

## 2020-07-31 NOTE — Progress Notes (Signed)
   07/31/20 1935 07/31/20 2004 07/31/20 2009  Assess: MEWS Score  BP  --   --   --   Pulse Rate (!) 135 (!) 104 100  ECG Heart Rate (!) 124 (!) 115 95  Resp 18 (!) 23 20  SpO2  --   --   --   Assess: MEWS Score  MEWS Temp 0 0 0  MEWS Systolic 0 0 0  MEWS Pulse 2 2 0  MEWS RR 0 1 0  MEWS LOC 0 0 0  MEWS Score 2 3 0  MEWS Score Color Yellow Yellow Green    07/31/20 2020  Assess: MEWS Score  BP (!) 136/52  Pulse Rate 94  ECG Heart Rate 95  Resp 20  SpO2 98 %  Assess: MEWS Score  MEWS Temp 0  MEWS Systolic 0  MEWS Pulse 0  MEWS RR 0  MEWS LOC 0  MEWS Score 0  MEWS Score Color Green  F/u after administration of medication

## 2020-07-31 NOTE — Progress Notes (Signed)
Date and time results received: 07/31/20 2112    Test: Lactic Acid Critical Value: 2.2  Name of Provider Notified: Dr. Toniann Fail  Orders Received? Or Actions Taken?: LR 250 bolus

## 2020-07-31 NOTE — Progress Notes (Signed)
   07/31/20 1935 07/31/20 2004  Assess: MEWS Score  BP 132/68 120/60  Pulse Rate (!) 135 (!) 104  ECG Heart Rate (!) 124 (!) 115  Resp 18 (!) 23  SpO2  --  95 %  Assess: MEWS Score  MEWS Temp 0 0  MEWS Systolic 0 0  MEWS Pulse 2 2  MEWS RR 0 1  MEWS LOC 0 0  MEWS Score 2 3  MEWS Score Color Yellow Yellow  Document  Patient Outcome  --  Stabilized after interventions (decreased HR - continue to monitor)  Administration of Cardizem IVP

## 2020-07-31 NOTE — H&P (Addendum)
History and Physical    Ellen Barajas:[id number] DOB: 1928/08/03 DOA: 07/31/2020  PCP: Dr. Sharen Hones Consultants:  cardiology: Dr. Elease Hashimoto, optho: Dr. Ashley Royalty  Patient coming from:  Home - lives alone   Chief Complaint: Fall and confusion   HPI: Ellen Barajas is a 87 y.o. female with medical history significant of atrial fibrillation on xarelto, CAD with hx of CABG in 2002, HLD, HTN, hx of hip fracture presenting with fall and confusion. Her niece called her last night and couldn't get a hold of her. She tried again this morning and still couldn't a hold of her. She reached out to her neighbor who went over to her house and the door was locked, lights off and her paper was on the door step.  Her niece came home and found her in the chair with bruising on her face, blood on the floor and she couldn't remember much of anything. Niece states she was confused, but knew who she was. There were pill bottles on floor. She called 911 and was brought to the hospital. Last known well on Wednesday night. She is resting comfortably. Denies any current headache, vision changes, chest pain, palpitations, shortness of breath, abdominal pain, N/V/D, dysuria or fever/chills. She has noticed increased shortness of breath with exertion and need for breaks. Denies any leg swelling, coughing or weight gain.   Her niece states she had a medicare visit Monday and was told she didn't pass her memory test. She is better on exam, but not to her baseline cognitively.   ED Course: vitals: bp: 120/70, HR: 100-120, CBG: 146, RR: 17, no pain and afebrile. Pertinent labs: Troponin: 3240 CK: 731. EKG with afib and no significant changes since her last EKG. Head CT, CXR, pelvis xray wnl. CT cervical spine with no acute fractures.  CT maxillofacial: multiple fractures. Layering blood in right maxillary sinus. Asked to admit.   Review of Systems: As per HPI; otherwise review of systems reviewed and negative.   Ambulatory Status:   Ambulates with a four pronged cane   COVID Vaccine Status:  moderna x 2   Past Medical History:  Diagnosis Date   AF (atrial fibrillation) (HCC)    coumadin   Arthritis    "hands" (09/27/2017)   Coronary artery disease 2002   s/p CABG   Diverticulosis    Dyslipidemia    History of chicken pox    IHD (ischemic heart disease)    Myocardial infarction (HCC) ~ 2002   Post herpetic neuralgia 02/2012    Past Surgical History:  Procedure Laterality Date   BREAST LUMPECTOMY Left 1992   excision of fibrous tumor/notes 06/19/2000   CARDIAC CATHETERIZATION  06/08/2000   CATARACT EXTRACTION W/ INTRAOCULAR LENS  IMPLANT, BILATERAL Bilateral 2013   CORONARY ARTERY BYPASS GRAFT  2002   x 4   EYE SURGERY Bilateral 1989   FEMUR IM NAIL Left 09/28/2017   Procedure: INTRAMEDULLARY (IM) NAIL FEMORAL;  Surgeon: Sheral Apley, MD;  Location: [facility] OR;  Service: Orthopedics;  Laterality: Left;   INTRAMEDULLARY (IM) NAIL INTERTROCHANTERIC Right 11/22/2018   Procedure: INTRAMEDULLARY (IM) NAIL INTERTROCHANTRIC;  Surgeon: Sheral Apley, MD;  Location: [facility] OR;  Service: Orthopedics;  Laterality: Right;    Social History   Socioeconomic History   Marital status: Married    Spouse name: Not on file   Number of children: Not on file   Years of education: Not on file   Highest education level: Not on file  Occupational  History   Not on file  Tobacco Use   Smoking status: Never   Smokeless tobacco: Never  Vaping Use   Vaping Use: Never used  Substance and Sexual Activity   Alcohol use: Never   Drug use: Never   Sexual activity: Not on file  Other Topics Concern   Not on file  Social History Narrative   Widow- husband passed away Feb 26, 2018, hospice involved, he was previously disabled after stroke and she was his long time caregiver   Occupation: retired, was Public house manager   Social Determinants of Corporate investment banker Strain: Not on Ship broker Insecurity: Not on file  Transportation Needs: Not  on file  Physical Activity: Not on file  Stress: Not on file  Social Connections: Not on file  Intimate Partner Violence: Not on file    Allergies  Allergen Reactions   Crestor [Rosuvastatin Calcium] Other (See Comments)    unknown   Lipitor [Atorvastatin Calcium] Other (See Comments)    unknown   Niaspan [Niacin Er] Other (See Comments)    unknown   Other Other (See Comments)    PER PATIENT SHE DOES NOT TOLERATE NARCOTICS   Pravastatin Other (See Comments)    unknown   Ramipril [Ramipril] Other (See Comments)    Hyperkalemia   Welchol [Colesevelam Hcl] Other (See Comments)    unknown   Zetia [Ezetimibe] Other (See Comments)    unknown    Family History  Problem Relation Age of Onset   CAD Father 73       MI   CAD Mother 38       MI    Prior to Admission medications   Medication Sig Start Date End Date Taking? Authorizing Provider  atenolol (TENORMIN) 25 MG tablet TAKE 1 TABLET BY MOUTH DAILY Patient taking differently: Take 25 mg by mouth daily. 11/28/19  Yes Nahser, Deloris Ping, MD  losartan (COZAAR) 25 MG tablet Take 1 tablet by mouth once daily Patient taking differently: Take 25 mg by mouth daily. 10/08/19  Yes Nahser, Deloris Ping, MD  Rivaroxaban (XARELTO) 15 MG TABS tablet Take 1 tablet (15 mg total) by mouth daily with supper. 11/28/19  Yes Nahser, Deloris Ping, MD  polyethylene glycol (MIRALAX / GLYCOLAX) 17 g packet Take 17 g by mouth daily as needed for mild constipation. Patient not taking: Reported on 07/31/2020 11/25/18   Zannie Cove, MD    Physical Exam: Vitals:   07/31/20 1353 07/31/20 1400 07/31/20 1415 07/31/20 1447  BP: (!) 159/77 (!) 166/79 (!) 171/86   Pulse: 98 95 98 95  Resp: 19 16 15 19   SpO2: 100% 100% 99% 100%  Weight:      Height:         General:  Appears calm and comfortable and is in NAD. Bruising over right side of face/orbit.  Eyes:  PERRL, EOMI, normal lids, iris. Small conjunctival hemorrhage in right eye  ENT:  hard of hearing, lips &  tongue, dry  mucous membranes; appropriate dentition Neck:  no LAD, masses or thyromegaly; no carotid bruits Cardiovascular:  irregularly irregular. +murmur , No LE edema.  Respiratory:   CTA bilaterally with no wheezes/rales/rhonchi.  Normal respiratory effort. Abdomen:  soft, NT, ND, NABS Back:   normal alignment, no CVAT Skin:  no rash or induration seen on limited exam. Bruising on face per above.  Musculoskeletal:  grossly normal tone BUE/BLE, good ROM, no bony abnormality Lower extremity:  No LE edema.  Limited foot exam with  no ulcerations.  2+ distal pulses. Psychiatric:  grossly normal mood and affect, speech fluent and appropriate, alert. Oriented to person and place, not date.  Neurologic:  CN 2-12 grossly intact, moves all extremities in coordinated fashion, sensation intact    Radiological Exams on Admission: Independently reviewed - see discussion in A/P where applicable  CT Head Wo Contrast  Result Date: 07/31/2020 CLINICAL DATA:  Fall today with facial trauma. EXAM: CT HEAD WITHOUT CONTRAST CT MAXILLOFACIAL WITHOUT CONTRAST CT CERVICAL SPINE WITHOUT CONTRAST TECHNIQUE: Multidetector CT imaging of the head, cervical spine, and maxillofacial structures were performed using the standard protocol without intravenous contrast. Multiplanar CT image reconstructions of the cervical spine and maxillofacial structures were also generated. COMPARISON:  None. FINDINGS: CT HEAD FINDINGS Brain: There is no evidence of acute intracranial hemorrhage, mass lesion, brain edema or extra-axial fluid collection. Mild-to-moderate atrophy with prominence of the ventricles and subarachnoid spaces. There are patchy small vessel ischemic changes in the periventricular white matter and basal ganglia bilaterally. There is no CT evidence of acute cortical infarction. Vascular: Intracranial vascular calcifications. No hyperdense vessel identified. Skull: Negative for fracture or focal lesion. Other: None. CT  MAXILLOFACIAL FINDINGS Osseous: There are acute, mildly displaced fractures of the anterior and posterolateral walls of the right maxillary sinus with associated layering blood in the sinus. There is also a mildly comminuted and depressed fracture of the right zygomatic arch. Possible minimal diastasis at the right frontozygomatic suture. No evidence of orbital or other facial fracture. The mandible and temporomandibular joints are intact. There are moderate TMJ degenerative changes bilaterally. Orbits: No evidence of orbital fracture. The globes are intact. Previous lens surgery and scleral banding bilaterally. No evidence of orbital hematoma. The extraocular muscles and optic nerves appear normal. Sinuses: Layering blood in the right maxillary sinus. The additional paranasal sinuses are clear without air-fluid levels. The mastoid air cells and middle ears are clear. Soft tissues: Soft tissue swelling over the right cheek and malar region without focal hematoma or foreign body. CT CERVICAL SPINE FINDINGS Alignment: Straightening with minimal retrolisthesis at C5-6 and anterolisthesis at C7-T1 and T1-2. Skull base and vertebrae: No evidence of acute fracture or traumatic subluxation. Soft tissues and spinal canal: Mild ossification of the ligamentum nuchae. No prevertebral fluid or swelling. No visible canal hematoma. Disc levels: Multilevel mild spondylosis for age, with disc space narrowing and uncinate spurring greatest at C5-6. Scattered facet degenerative changes. Upper chest: Bilateral carotid atherosclerosis. Other: None. IMPRESSION: 1. Mildly displaced fractures of the right zygomatic arch, anterior and posterolateral walls of the right maxillary sinus and possible mild diastasis of the right frontozygomatic suture (tripod fracture variant). No other facial fractures. 2. Overlying right facial soft tissue swelling without orbital hematoma or foreign body. 3. No acute intracranial or calvarial findings. Mild  atrophy and chronic small vessel ischemic changes. 4. No evidence of acute cervical spine fracture, traumatic subluxation or static signs of instability. Multilevel cervical spondylosis. Electronically Signed   By: Carey Bullocks M.D.   On: 07/31/2020 14:22   CT Cervical Spine Wo Contrast  Result Date: 07/31/2020 CLINICAL DATA:  Fall today with facial trauma. EXAM: CT HEAD WITHOUT CONTRAST CT MAXILLOFACIAL WITHOUT CONTRAST CT CERVICAL SPINE WITHOUT CONTRAST TECHNIQUE: Multidetector CT imaging of the head, cervical spine, and maxillofacial structures were performed using the standard protocol without intravenous contrast. Multiplanar CT image reconstructions of the cervical spine and maxillofacial structures were also generated. COMPARISON:  None. FINDINGS: CT HEAD FINDINGS Brain: There is no evidence of acute  intracranial hemorrhage, mass lesion, brain edema or extra-axial fluid collection. Mild-to-moderate atrophy with prominence of the ventricles and subarachnoid spaces. There are patchy small vessel ischemic changes in the periventricular white matter and basal ganglia bilaterally. There is no CT evidence of acute cortical infarction. Vascular: Intracranial vascular calcifications. No hyperdense vessel identified. Skull: Negative for fracture or focal lesion. Other: None. CT MAXILLOFACIAL FINDINGS Osseous: There are acute, mildly displaced fractures of the anterior and posterolateral walls of the right maxillary sinus with associated layering blood in the sinus. There is also a mildly comminuted and depressed fracture of the right zygomatic arch. Possible minimal diastasis at the right frontozygomatic suture. No evidence of orbital or other facial fracture. The mandible and temporomandibular joints are intact. There are moderate TMJ degenerative changes bilaterally. Orbits: No evidence of orbital fracture. The globes are intact. Previous lens surgery and scleral banding bilaterally. No evidence of orbital  hematoma. The extraocular muscles and optic nerves appear normal. Sinuses: Layering blood in the right maxillary sinus. The additional paranasal sinuses are clear without air-fluid levels. The mastoid air cells and middle ears are clear. Soft tissues: Soft tissue swelling over the right cheek and malar region without focal hematoma or foreign body. CT CERVICAL SPINE FINDINGS Alignment: Straightening with minimal retrolisthesis at C5-6 and anterolisthesis at C7-T1 and T1-2. Skull base and vertebrae: No evidence of acute fracture or traumatic subluxation. Soft tissues and spinal canal: Mild ossification of the ligamentum nuchae. No prevertebral fluid or swelling. No visible canal hematoma. Disc levels: Multilevel mild spondylosis for age, with disc space narrowing and uncinate spurring greatest at C5-6. Scattered facet degenerative changes. Upper chest: Bilateral carotid atherosclerosis. Other: None. IMPRESSION: 1. Mildly displaced fractures of the right zygomatic arch, anterior and posterolateral walls of the right maxillary sinus and possible mild diastasis of the right frontozygomatic suture (tripod fracture variant). No other facial fractures. 2. Overlying right facial soft tissue swelling without orbital hematoma or foreign body. 3. No acute intracranial or calvarial findings. Mild atrophy and chronic small vessel ischemic changes. 4. No evidence of acute cervical spine fracture, traumatic subluxation or static signs of instability. Multilevel cervical spondylosis. Electronically Signed   By: Carey Bullocks M.D.   On: 07/31/2020 14:22   DG Pelvis Portable  Result Date: 07/31/2020 CLINICAL DATA:  Status post fall. EXAM: PORTABLE PELVIS 1-2 VIEWS COMPARISON:  None. FINDINGS: There is no evidence of pelvic fracture or diastasis. Fixation hardware for healed bilateral intertrochanteric fractures noted. No pelvic bone lesions are seen. Lower lumbar spondylosis. IMPRESSION: No acute abnormality. Electronically  Signed   By: Drusilla Kanner M.D.   On: 07/31/2020 15:12   DG Chest Port 1 View  Result Date: 07/31/2020 CLINICAL DATA:  Status post fall. EXAM: PORTABLE CHEST 1 VIEW COMPARISON:  Single-view of the chest 11/21/2018. FINDINGS: The patient is status post CABG. There is cardiomegaly. Aortic atherosclerosis. Lungs clear. No pneumothorax or pleural fluid. No acute or focal bony abnormality. IMPRESSION: No acute disease. Electronically Signed   By: Drusilla Kanner M.D.   On: 07/31/2020 15:13   CT Maxillofacial WO CM  Result Date: 07/31/2020 CLINICAL DATA:  Fall today with facial trauma. EXAM: CT HEAD WITHOUT CONTRAST CT MAXILLOFACIAL WITHOUT CONTRAST CT CERVICAL SPINE WITHOUT CONTRAST TECHNIQUE: Multidetector CT imaging of the head, cervical spine, and maxillofacial structures were performed using the standard protocol without intravenous contrast. Multiplanar CT image reconstructions of the cervical spine and maxillofacial structures were also generated. COMPARISON:  None. FINDINGS: CT HEAD FINDINGS Brain: There is no  evidence of acute intracranial hemorrhage, mass lesion, brain edema or extra-axial fluid collection. Mild-to-moderate atrophy with prominence of the ventricles and subarachnoid spaces. There are patchy small vessel ischemic changes in the periventricular white matter and basal ganglia bilaterally. There is no CT evidence of acute cortical infarction. Vascular: Intracranial vascular calcifications. No hyperdense vessel identified. Skull: Negative for fracture or focal lesion. Other: None. CT MAXILLOFACIAL FINDINGS Osseous: There are acute, mildly displaced fractures of the anterior and posterolateral walls of the right maxillary sinus with associated layering blood in the sinus. There is also a mildly comminuted and depressed fracture of the right zygomatic arch. Possible minimal diastasis at the right frontozygomatic suture. No evidence of orbital or other facial fracture. The mandible and  temporomandibular joints are intact. There are moderate TMJ degenerative changes bilaterally. Orbits: No evidence of orbital fracture. The globes are intact. Previous lens surgery and scleral banding bilaterally. No evidence of orbital hematoma. The extraocular muscles and optic nerves appear normal. Sinuses: Layering blood in the right maxillary sinus. The additional paranasal sinuses are clear without air-fluid levels. The mastoid air cells and middle ears are clear. Soft tissues: Soft tissue swelling over the right cheek and malar region without focal hematoma or foreign body. CT CERVICAL SPINE FINDINGS Alignment: Straightening with minimal retrolisthesis at C5-6 and anterolisthesis at C7-T1 and T1-2. Skull base and vertebrae: No evidence of acute fracture or traumatic subluxation. Soft tissues and spinal canal: Mild ossification of the ligamentum nuchae. No prevertebral fluid or swelling. No visible canal hematoma. Disc levels: Multilevel mild spondylosis for age, with disc space narrowing and uncinate spurring greatest at C5-6. Scattered facet degenerative changes. Upper chest: Bilateral carotid atherosclerosis. Other: None. IMPRESSION: 1. Mildly displaced fractures of the right zygomatic arch, anterior and posterolateral walls of the right maxillary sinus and possible mild diastasis of the right frontozygomatic suture (tripod fracture variant). No other facial fractures. 2. Overlying right facial soft tissue swelling without orbital hematoma or foreign body. 3. No acute intracranial or calvarial findings. Mild atrophy and chronic small vessel ischemic changes. 4. No evidence of acute cervical spine fracture, traumatic subluxation or static signs of instability. Multilevel cervical spondylosis. Electronically Signed   By: Carey BullocksWilliam  Veazey M.D.   On: 07/31/2020 14:22    EKG: Independently reviewed.  Irregularly irregular with rate 110; T wave depression in lateral leads, not significantly changed from previous  EKG.   Labs on Admission: I have personally reviewed the available labs and imaging studies at the time of the admission.  Pertinent labs:  Troponin: 3240 CK: 731 Creatinine: 1.05 Covid/flu negative   Assessment/Plan Principal Problem:   NSTEMI (non-ST elevated myocardial infarction) [facility](HCC) -cardiology consulted in ER. F/u on recommendations.  -trending troponins 3240---> -she has no active chest pain and EKG with no significant changes  -place on tele, continue beta blocker. Not on statin. Lipid panel ordered.   Syncope and collapse -tele/echo. Likely needs long term monitor.  -labs with no significant abnormality  -likely secondary to cardiac origin  -f/u with cardiology recommendation  -ordering PT with fall/living alone.   Elevated CK and creatinine  -on ground after fall and chair with no PO intake. Unsure how long she was down -light IVF over night -trend creatinine in AM, no AKI  Active Problems:   Longstanding persistent atrial fibrillation (HCC) -rate controlled -continue tele -continue her atenolol and xarelto unless cardiology decides on heparin.  -discussed fracture/ blood with ENT and okay with xarelto.     CAD s/p CABG in  2002 -tele -continue atenolol -can not tolerate statins    Essential hypertension Elevated in ER. Has not had her home medication in 1-2 days.  -continue her atenolol and cozaar and monitor    Multiple facial fractures, closed, initial encounter [facility]) -phone call with on call ENT Dr Elenore Rota. No official consult.  -recommended abx for pooling of blood in maxillary sinus as these tend to get infected. Started her on course of keflex.  -no follow up with ENT needed for fractures.     Dyslipidemia  -lipid panel pending -per cardiology recommendation with age/risk benefit.    Body mass index is 28 kg/m.   Level of care: Telemetry Medical DVT prophylaxis:  xarelto  Code Status:  DNR- confirmed with patient/family Family  Communication: [name], niece. Medical POA as well.  Disposition Plan:  The patient is from: home  Anticipated d/c is to: home with [facility] services/ home care once her cardiology issues have been resolved. Her niece is going to need to get this arranged. Would like to do in home care.    Consults called: cardiology in ED.   Admission status:  inpatient    Orland Mustard MD [facility]   How to contact the [facility] Attending or Consulting provider 7A - 7P or covering provider during after hours 7P -7A, for this patient?  Check the care team in [facility] and look for a) attending/consulting [facility] provider listed and b) the [facility] team listed Log into [email or web address] and use [facility] universal password to access. If you do not have the password, please contact the hospital operator. Locate the [facility] provider you are looking for under [facility] and page to a number that you can be directly reached. If you still have difficulty reaching the provider, please page the [facility] (Director on Call) for the Hospitalists listed on amion for assistance.   07/31/2020, 4:34 PM

## 2020-07-31 NOTE — ED Triage Notes (Signed)
Pt arrived via [facility], lives at home alone and family had not been able to reach her. Pt was found on floor today in her bedroom.  She has bruising to right side of face and eye. EMST reported right facial droop, right sided weakness with neglect.  Pt is able to follow commands and has no drifts to either extremities on assessment on arrival to ED. Pt remembers knowing it was dark when she fell, then saw light, then it got dark again, and then saw light this am. There were blood thinner bottles of warfarin and eliquis at scene. EMS reports patient is just not oriented to time.  She is answering questions and is HOH. BP 120/70s and HR 100-120s. CBG 146.

## 2020-07-31 NOTE — ED Provider Notes (Signed)
[facility] EMERGENCY DEPARTMENT Provider Note   CSN: [id number] Arrival date & time: 07/31/20  1229     History Chief Complaint  Patient presents with   Fall    Pt arrived via [place], lives at home alone and family had not been able to reach her. Pt was found on floor today in her bedroom.  She has bruising to right side of face and eye. EMST reported right facial droop, right sided weakness with neglect.  Pt is able to follow commands and has no drifts to either extremities on assessment on arrival to ED. Pt remembers knowing it was dark when she fell, then saw light, then it got dark again, and then saw light this am. There were blood thinner bottles of warfar   Altered Mental Status    Pt arrived via [facility], lives at home alone and family had not been able to reach her. Pt was found on floor today in her bedroom.  She has bruising to right side of face and eye. EMST reported right facial droop, right sided weakness with neglect.  Pt is able to follow commands and has no drifts to either extremities on assessment on arrival to ED. Pt remembers knowing it was dark when she fell, then saw light, then it got dark again, and then saw light this am. There were blood thinner bottles of warfar    Ellen Barajas is a 87 y.o. female.  HPI Patient was last seen Wednesday evening 7/6 patient's son who is visiting.  Her niece had tried to call her on Thursday evening but she did not answer the phone.  Sometimes patient does not answer the phone in the evenings.,  Her niece tried to call again in the next morning and did not get an answer and thus sent a neighbor to check in.  The door was locked.  The niece went to the house and found the patient sitting in a chair in her bedroom.  She was very bruised.  There was an area of blood on the floor.  Also she noticed that the pill bottles were in disarray with some of the labels pulled off.  Patient seems somewhat confused and EMS was called.   Patient reports she does not remember anything and after her fall.  She denies she is having pain.  She denies headache, chest pain neck pain or abdominal pain.    Past Medical History:  Diagnosis Date   AF (atrial fibrillation) (HCC)    coumadin   Arthritis    "hands" (09/27/2017)   Coronary artery disease 2002   s/p CABG   Diverticulosis    Dyslipidemia    History of chicken pox    IHD (ischemic heart disease)    Myocardial infarction (HCC) ~ 2002   Post herpetic neuralgia 02/2012    Patient Active Problem List   Diagnosis Date Noted   NSTEMI (non-ST elevated myocardial infarction) (HCC) 07/31/2020   Bilateral intertrochanteric hip fractures (HCC)    Femur fracture, right (HCC) 11/21/2018   AF (paroxysmal atrial fibrillation) (HCC) 11/21/2018   Hip fracture (HCC) 09/27/2017   Health maintenance examination 09/21/2016   Essential hypertension 04/11/2016   Medicare annual wellness visit, subsequent 10/16/2014   Advanced care planning/counseling discussion 10/16/2014   DNR (do not resuscitate) 10/16/2014   Osteoarthritis 04/19/2013   Hx of CABG 12/17/2012   Post herpetic neuralgia 02/25/2012   IHD (ischemic heart disease)    Dyslipidemia    CAD (coronary artery  disease)    Longstanding persistent atrial fibrillation (HCC) 04/19/2010    Past Surgical History:  Procedure Laterality Date   BREAST LUMPECTOMY Left 1992   excision of fibrous tumor/notes 06/19/2000   CARDIAC CATHETERIZATION  06/08/2000   CATARACT EXTRACTION W/ INTRAOCULAR LENS  IMPLANT, BILATERAL Bilateral 2013   CORONARY ARTERY BYPASS GRAFT  2002   x 4   EYE SURGERY Bilateral 1989   FEMUR IM NAIL Left 09/28/2017   Procedure: INTRAMEDULLARY (IM) NAIL FEMORAL;  Surgeon: Sheral Apley, MD;  Location: [facility] OR;  Service: Orthopedics;  Laterality: Left;   INTRAMEDULLARY (IM) NAIL INTERTROCHANTERIC Right 11/22/2018   Procedure: INTRAMEDULLARY (IM) NAIL INTERTROCHANTRIC;  Surgeon: Sheral Apley, MD;  Location: [facility]  OR;  Service: Orthopedics;  Laterality: Right;     OB History   No obstetric history on file.     Family History  Problem Relation Age of Onset   CAD Father 65       MI   CAD Mother 3       MI    Social History   Tobacco Use   Smoking status: Never   Smokeless tobacco: Never  Vaping Use   Vaping Use: Never used  Substance Use Topics   Alcohol use: Never   Drug use: Never    Home Medications Prior to Admission medications   Medication Sig Start Date End Date Taking? Authorizing Provider  atenolol (TENORMIN) 25 MG tablet TAKE 1 TABLET BY MOUTH DAILY Patient taking differently: Take 25 mg by mouth daily. 11/28/19  Yes Nahser, Deloris Ping, MD  losartan (COZAAR) 25 MG tablet Take 1 tablet by mouth once daily Patient taking differently: Take 25 mg by mouth daily. 10/08/19  Yes Nahser, Deloris Ping, MD  Rivaroxaban (XARELTO) 15 MG TABS tablet Take 1 tablet (15 mg total) by mouth daily with supper. 11/28/19  Yes Nahser, Deloris Ping, MD  polyethylene glycol (MIRALAX / GLYCOLAX) 17 g packet Take 17 g by mouth daily as needed for mild constipation. Patient not taking: Reported on 07/31/2020 11/25/18   Zannie Cove, MD    Allergies    Crestor Duncan Dull calcium], Lipitor [atorvastatin calcium], Niaspan [niacin er], Other, Pravastatin, Ramipril [ramipril], Welchol [colesevelam hcl], and Zetia [ezetimibe]  Review of Systems   Review of Systems 10 systems reviewed and negative except as per HPI Physical Exam Updated Vital Signs BP (!) 171/86   Pulse 95   Resp 19   Ht 5\' 3"  (1.6 m)   Wt 71.7 kg   SpO2 100%   BMI 28.00 kg/m   Physical Exam Constitutional:      Comments: Patient is alert.  She is following commands.  No respiratory distress.  C-collar in place.  A lot of facial bruising.  HENT:     Head:     Comments: Extensive bruising to the right side of the face.  Bruising over the zygoma and angle of the mandible.  Some periorbital hematoma on the right but is not causing  significant swelling of the palpebral fissures.  These are easily open with the patient looking side to side.  There is some dried blood in the nose.    Mouth/Throat:     Comments: Airway clear.  Speech clear. Eyes:     Comments: Pupils equally round and reactive to light.  Extraocular motions intact.  Patient has a moderate to large subconjunctival hemorrhage on the right.  No hyphema.  Neck:     Comments: Cervical collar maintained. Cardiovascular:  Rate and Rhythm: Normal rate and regular rhythm.  Pulmonary:     Effort: Pulmonary effort is normal.     Breath sounds: Normal breath sounds.  Chest:     Chest wall: No tenderness.  Abdominal:     General: There is no distension.     Palpations: Abdomen is soft.     Tenderness: There is no abdominal tenderness. There is no guarding.  Musculoskeletal:        General: No swelling or tenderness. Normal range of motion.     Right lower leg: No edema.     Left lower leg: No edema.     Comments: No deformities of extremities.  Patient can go through full range of motion both lower extremities with flexion extension and push against resistance without pain.  Both upper extremities good grip strength good push pull.  Skin:    General: Skin is warm and dry.  Neurological:     Comments: Patient is alert.  She is very hard of hearing but when she understands, responses are appropriate.  Patient does not have good recall for what happened after she fell.  She is situationally oriented.  Patient follows commands appropriately for grip strength both upper extremities symmetric.  She follows commands for dorsiflexion both lower extremities and can independently hold each lower extremity.  Psychiatric:     Comments: Calm and cooperative.    ED Results / Procedures / Treatments   Labs (all labs ordered are listed, but only abnormal results are displayed) Labs Reviewed  COMPREHENSIVE METABOLIC PANEL - Abnormal; Notable for the following components:       Result Value   Glucose, Bld 123 (*)    Creatinine, Ser 1.05 (*)    AST 50 (*)    Total Bilirubin 1.6 (*)    GFR, Estimated 51 (*)    All other components within normal limits  CBC WITH DIFFERENTIAL/PLATELET - Abnormal; Notable for the following components:   Neutro Abs 8.4 (*)    All other components within normal limits  BLOOD GAS, VENOUS - Abnormal; Notable for the following components:   pCO2, Ven 42.8 (*)    pO2, Ven <31.0 (*)    All other components within normal limits  CK - Abnormal; Notable for the following components:   Total CK 731 (*)    All other components within normal limits  I-STAT VENOUS BLOOD GAS, ED - Abnormal; Notable for the following components:   pO2, Ven 60.0 (*)    All other components within normal limits  TROPONIN I (HIGH SENSITIVITY) - Abnormal; Notable for the following components:   Troponin I (High Sensitivity) 3,240 (*)    All other components within normal limits  RESP PANEL BY RT-PCR (FLU A&B, COVID) ARPGX2  LACTIC ACID, PLASMA  LIPASE, BLOOD  PROTIME-INR  MAGNESIUM  PHOSPHORUS  LACTIC ACID, PLASMA  URINALYSIS, ROUTINE W REFLEX MICROSCOPIC  TYPE AND SCREEN  TROPONIN I (HIGH SENSITIVITY)    EKG EKG Interpretation  Date/Time:  Friday July 31 2020 12:38:26 EDT Ventricular Rate:  110 PR Interval:    QRS Duration: 76 QT Interval:  322 QTC Calculation: 436 R Axis:   81 Text Interpretation: Atrial fibrillation Borderline right axis deviation Probable LVH with secondary repol abnrm ST depression, consider ischemia, diffuse lds agree, inf and lateral ST depression new from previous Confirmed by Arby BarrettePfeiffer, Tevan Marian [phone]([id number]) on 07/31/2020 3:26:52 PM  Radiology CT Head Wo Contrast  Result Date: 07/31/2020 CLINICAL DATA:  Fall today with facial trauma.  EXAM: CT HEAD WITHOUT CONTRAST CT MAXILLOFACIAL WITHOUT CONTRAST CT CERVICAL SPINE WITHOUT CONTRAST TECHNIQUE: Multidetector CT imaging of the head, cervical spine, and maxillofacial structures were  performed using the standard protocol without intravenous contrast. Multiplanar CT image reconstructions of the cervical spine and maxillofacial structures were also generated. COMPARISON:  None. FINDINGS: CT HEAD FINDINGS Brain: There is no evidence of acute intracranial hemorrhage, mass lesion, brain edema or extra-axial fluid collection. Mild-to-moderate atrophy with prominence of the ventricles and subarachnoid spaces. There are patchy small vessel ischemic changes in the periventricular white matter and basal ganglia bilaterally. There is no CT evidence of acute cortical infarction. Vascular: Intracranial vascular calcifications. No hyperdense vessel identified. Skull: Negative for fracture or focal lesion. Other: None. CT MAXILLOFACIAL FINDINGS Osseous: There are acute, mildly displaced fractures of the anterior and posterolateral walls of the right maxillary sinus with associated layering blood in the sinus. There is also a mildly comminuted and depressed fracture of the right zygomatic arch. Possible minimal diastasis at the right frontozygomatic suture. No evidence of orbital or other facial fracture. The mandible and temporomandibular joints are intact. There are moderate TMJ degenerative changes bilaterally. Orbits: No evidence of orbital fracture. The globes are intact. Previous lens surgery and scleral banding bilaterally. No evidence of orbital hematoma. The extraocular muscles and optic nerves appear normal. Sinuses: Layering blood in the right maxillary sinus. The additional paranasal sinuses are clear without air-fluid levels. The mastoid air cells and middle ears are clear. Soft tissues: Soft tissue swelling over the right cheek and malar region without focal hematoma or foreign body. CT CERVICAL SPINE FINDINGS Alignment: Straightening with minimal retrolisthesis at C5-6 and anterolisthesis at C7-T1 and T1-2. Skull base and vertebrae: No evidence of acute fracture or traumatic subluxation. Soft  tissues and spinal canal: Mild ossification of the ligamentum nuchae. No prevertebral fluid or swelling. No visible canal hematoma. Disc levels: Multilevel mild spondylosis for age, with disc space narrowing and uncinate spurring greatest at C5-6. Scattered facet degenerative changes. Upper chest: Bilateral carotid atherosclerosis. Other: None. IMPRESSION: 1. Mildly displaced fractures of the right zygomatic arch, anterior and posterolateral walls of the right maxillary sinus and possible mild diastasis of the right frontozygomatic suture (tripod fracture variant). No other facial fractures. 2. Overlying right facial soft tissue swelling without orbital hematoma or foreign body. 3. No acute intracranial or calvarial findings. Mild atrophy and chronic small vessel ischemic changes. 4. No evidence of acute cervical spine fracture, traumatic subluxation or static signs of instability. Multilevel cervical spondylosis. Electronically Signed   By: Carey Bullocks M.D.   On: 07/31/2020 14:22   CT Cervical Spine Wo Contrast  Result Date: 07/31/2020 CLINICAL DATA:  Fall today with facial trauma. EXAM: CT HEAD WITHOUT CONTRAST CT MAXILLOFACIAL WITHOUT CONTRAST CT CERVICAL SPINE WITHOUT CONTRAST TECHNIQUE: Multidetector CT imaging of the head, cervical spine, and maxillofacial structures were performed using the standard protocol without intravenous contrast. Multiplanar CT image reconstructions of the cervical spine and maxillofacial structures were also generated. COMPARISON:  None. FINDINGS: CT HEAD FINDINGS Brain: There is no evidence of acute intracranial hemorrhage, mass lesion, brain edema or extra-axial fluid collection. Mild-to-moderate atrophy with prominence of the ventricles and subarachnoid spaces. There are patchy small vessel ischemic changes in the periventricular white matter and basal ganglia bilaterally. There is no CT evidence of acute cortical infarction. Vascular: Intracranial vascular calcifications.  No hyperdense vessel identified. Skull: Negative for fracture or focal lesion. Other: None. CT MAXILLOFACIAL FINDINGS Osseous: There are acute, mildly displaced fractures  of the anterior and posterolateral walls of the right maxillary sinus with associated layering blood in the sinus. There is also a mildly comminuted and depressed fracture of the right zygomatic arch. Possible minimal diastasis at the right frontozygomatic suture. No evidence of orbital or other facial fracture. The mandible and temporomandibular joints are intact. There are moderate TMJ degenerative changes bilaterally. Orbits: No evidence of orbital fracture. The globes are intact. Previous lens surgery and scleral banding bilaterally. No evidence of orbital hematoma. The extraocular muscles and optic nerves appear normal. Sinuses: Layering blood in the right maxillary sinus. The additional paranasal sinuses are clear without air-fluid levels. The mastoid air cells and middle ears are clear. Soft tissues: Soft tissue swelling over the right cheek and malar region without focal hematoma or foreign body. CT CERVICAL SPINE FINDINGS Alignment: Straightening with minimal retrolisthesis at C5-6 and anterolisthesis at C7-T1 and T1-2. Skull base and vertebrae: No evidence of acute fracture or traumatic subluxation. Soft tissues and spinal canal: Mild ossification of the ligamentum nuchae. No prevertebral fluid or swelling. No visible canal hematoma. Disc levels: Multilevel mild spondylosis for age, with disc space narrowing and uncinate spurring greatest at C5-6. Scattered facet degenerative changes. Upper chest: Bilateral carotid atherosclerosis. Other: None. IMPRESSION: 1. Mildly displaced fractures of the right zygomatic arch, anterior and posterolateral walls of the right maxillary sinus and possible mild diastasis of the right frontozygomatic suture (tripod fracture variant). No other facial fractures. 2. Overlying right facial soft tissue swelling  without orbital hematoma or foreign body. 3. No acute intracranial or calvarial findings. Mild atrophy and chronic small vessel ischemic changes. 4. No evidence of acute cervical spine fracture, traumatic subluxation or static signs of instability. Multilevel cervical spondylosis. Electronically Signed   By: Carey Bullocks M.D.   On: 07/31/2020 14:22   DG Pelvis Portable  Result Date: 07/31/2020 CLINICAL DATA:  Status post fall. EXAM: PORTABLE PELVIS 1-2 VIEWS COMPARISON:  None. FINDINGS: There is no evidence of pelvic fracture or diastasis. Fixation hardware for healed bilateral intertrochanteric fractures noted. No pelvic bone lesions are seen. Lower lumbar spondylosis. IMPRESSION: No acute abnormality. Electronically Signed   By: Drusilla Kanner M.D.   On: 07/31/2020 15:12   DG Chest Port 1 View  Result Date: 07/31/2020 CLINICAL DATA:  Status post fall. EXAM: PORTABLE CHEST 1 VIEW COMPARISON:  Single-view of the chest 11/21/2018. FINDINGS: The patient is status post CABG. There is cardiomegaly. Aortic atherosclerosis. Lungs clear. No pneumothorax or pleural fluid. No acute or focal bony abnormality. IMPRESSION: No acute disease. Electronically Signed   By: Drusilla Kanner M.D.   On: 07/31/2020 15:13   CT Maxillofacial WO CM  Result Date: 07/31/2020 CLINICAL DATA:  Fall today with facial trauma. EXAM: CT HEAD WITHOUT CONTRAST CT MAXILLOFACIAL WITHOUT CONTRAST CT CERVICAL SPINE WITHOUT CONTRAST TECHNIQUE: Multidetector CT imaging of the head, cervical spine, and maxillofacial structures were performed using the standard protocol without intravenous contrast. Multiplanar CT image reconstructions of the cervical spine and maxillofacial structures were also generated. COMPARISON:  None. FINDINGS: CT HEAD FINDINGS Brain: There is no evidence of acute intracranial hemorrhage, mass lesion, brain edema or extra-axial fluid collection. Mild-to-moderate atrophy with prominence of the ventricles and subarachnoid  spaces. There are patchy small vessel ischemic changes in the periventricular white matter and basal ganglia bilaterally. There is no CT evidence of acute cortical infarction. Vascular: Intracranial vascular calcifications. No hyperdense vessel identified. Skull: Negative for fracture or focal lesion. Other: None. CT MAXILLOFACIAL FINDINGS Osseous: There are acute,  mildly displaced fractures of the anterior and posterolateral walls of the right maxillary sinus with associated layering blood in the sinus. There is also a mildly comminuted and depressed fracture of the right zygomatic arch. Possible minimal diastasis at the right frontozygomatic suture. No evidence of orbital or other facial fracture. The mandible and temporomandibular joints are intact. There are moderate TMJ degenerative changes bilaterally. Orbits: No evidence of orbital fracture. The globes are intact. Previous lens surgery and scleral banding bilaterally. No evidence of orbital hematoma. The extraocular muscles and optic nerves appear normal. Sinuses: Layering blood in the right maxillary sinus. The additional paranasal sinuses are clear without air-fluid levels. The mastoid air cells and middle ears are clear. Soft tissues: Soft tissue swelling over the right cheek and malar region without focal hematoma or foreign body. CT CERVICAL SPINE FINDINGS Alignment: Straightening with minimal retrolisthesis at C5-6 and anterolisthesis at C7-T1 and T1-2. Skull base and vertebrae: No evidence of acute fracture or traumatic subluxation. Soft tissues and spinal canal: Mild ossification of the ligamentum nuchae. No prevertebral fluid or swelling. No visible canal hematoma. Disc levels: Multilevel mild spondylosis for age, with disc space narrowing and uncinate spurring greatest at C5-6. Scattered facet degenerative changes. Upper chest: Bilateral carotid atherosclerosis. Other: None. IMPRESSION: 1. Mildly displaced fractures of the right zygomatic arch,  anterior and posterolateral walls of the right maxillary sinus and possible mild diastasis of the right frontozygomatic suture (tripod fracture variant). No other facial fractures. 2. Overlying right facial soft tissue swelling without orbital hematoma or foreign body. 3. No acute intracranial or calvarial findings. Mild atrophy and chronic small vessel ischemic changes. 4. No evidence of acute cervical spine fracture, traumatic subluxation or static signs of instability. Multilevel cervical spondylosis. Electronically Signed   By: Carey Bullocks M.D.   On: 07/31/2020 14:22    Procedures Procedures  CRITICAL CARE Performed by: Arby Barrette   Total critical care time: 45 minutes  Critical care time was exclusive of separately billable procedures and treating other patients.  Critical care was necessary to treat or prevent imminent or life-threatening deterioration.  Critical care was time spent personally by me on the following activities: development of treatment plan with patient and/or surrogate as well as nursing, discussions with consultants, evaluation of patient's response to treatment, examination of patient, obtaining history from patient or surrogate, ordering and performing treatments and interventions, ordering and review of laboratory studies, ordering and review of radiographic studies, pulse oximetry and re-evaluation of patient's condition.  Medications Ordered in ED Medications  lactated ringers infusion ( Intravenous New Bag/Given 07/31/20 1334)    ED Course  I have reviewed the triage vital signs and the nursing notes.  Pertinent labs & imaging results that were available during my care of the patient were reviewed by me and considered in my medical decision making (see chart for details).  Clinical Course as of 08/11/20 1156  Fri Jul 31, 2020  1601 Consult: Cardiology will see in consult.   Consult: Dr. Sheppard Penton for admission to hospitalist service. [MP]    Clinical  Course User Index [MP] Arby Barrette, MD   MDM Rules/Calculators/A&P                         Patient presents as outlined.  Patient does have multiple facial fractures from a fall.  Also, patient described possible mechanical fall however troponins are significantly elevated consistent with NSTEMI.  At this time suspect patient had NSTEMI event and  this may have precipitated the fall.  Patient does not have intracranial bleeding.  She does however have extensive facial fractures.  Plan will be for admission to medical service with consultation to cardiology for management of NSTEMI and decision making regarding anticoagulation in setting of acute injuries.  Final Clinical Impression(s) / ED Diagnoses Final diagnoses:  Closed extensive facial fractures, initial encounter [facility])  NSTEMI (non-ST elevated myocardial infarction) [facility])    Rx / DC Orders ED Discharge Orders     None        Arby Barrette, MD 08/11/20 1159

## 2020-07-31 NOTE — Progress Notes (Signed)
   07/31/20 1935 07/31/20 2004 07/31/20 2009  Assess: MEWS Score  Pulse Rate (!) 135 (!) 104 100  ECG Heart Rate (!) 124 (!) 115 95  Resp 18 (!) 23 20  SpO2  --   --  99 %  Assess: MEWS Score  MEWS Temp 0 0 0  MEWS Systolic 0 0 0  MEWS Pulse 2 2 0  MEWS RR 0 1 0  MEWS LOC 0 0 0  MEWS Score 2 3 0  MEWS Score Color Yellow Yellow Green  F/u after administration

## 2020-07-31 NOTE — Progress Notes (Signed)
   07/31/20 1935 07/31/20 2004 07/31/20 2009  Assess: MEWS Score  BP  --   --   --   Pulse Rate (!) 135 (!) 104 100  ECG Heart Rate (!) 124 (!) 115 95  Resp 18 (!) 23 20  SpO2  --   --   --   Assess: MEWS Score  MEWS Temp 0 0 0  MEWS Systolic 0 0 0  MEWS Pulse 2 2 0  MEWS RR 0 1 0  MEWS LOC 0 0 0  MEWS Score 2 3 0  MEWS Score Color Yellow Yellow Green  Document  Patient Outcome  --   --   --     07/31/20 2020 07/31/20 2100  Assess: MEWS Score  BP (!) 136/52 (!) 114/57  Pulse Rate 94 77  ECG Heart Rate 95 80  Resp 20 16  SpO2  --  94 %  Assess: MEWS Score  MEWS Temp 0 0  MEWS Systolic 0 0  MEWS Pulse 0 0  MEWS RR 0 0  MEWS LOC 0 0  MEWS Score 0 0  MEWS Score Color Riki Sheer  Document  Patient Outcome  --  Stabilized after interventions

## 2020-07-31 NOTE — Progress Notes (Signed)
Admission from the ED by stretcher awake and alert.  CHG wipe done. Gown changed. Foam dressing applied as prophylaxis.  Pure wick applied. Made comfortable on bed. Belongings  are just the underwear and blue shirt.

## 2020-07-31 NOTE — Consult Note (Addendum)
Cardiology Consultation:   Patient ID: LITHZY BERNARD MRN: [id number]; DOB: 1928/06/18  Admit date: 07/31/2020 Date of Consult: 07/31/2020  PCP:  No primary care provider on file.   [facility] Providers Cardiologist:  Mertie Moores, MD   Patient Profile:   Ellen Barajas is a 87 y.o. female with a PMH of CAD s/p CABG in 2002, permanent atrial fibrillation, HTN, and HLD, who is being seen 07/31/2020 for the evaluation of elevated troponins at the request of Dr. Johnney Killian.  History of Present Illness:   Ellen Barajas was last known normal on the evening of 07/29/20. Family attempted to contact the patient on the evening of 07/30/20 but was unable to reach her which was not out of the ordinary. Attempts to reach her the following morning were unsuccessful prompting a neighbor to check on her which was locked. Ultimately her niece was able to get inside 07/31/20 and the patient was found sitting in a chair in her bedroom with various bruises to her face. There was evidence of blood on the floor and pill bottles were noted to be in disarray with some of the labels pulled off. EMS was activated and she was brought to [facility] ED for further evaluation.   She was last evaluated by cardiology at an outpatient visit with Dr. Acie Fredrickson 09/2019 at which time she was doing well from a cardiac standpoint. No medication changes occurred and she was recommended to follow-up in 1 year. Echocardiogram updated 09/2019 which showed EF 55-60%, indeterminate LV diastolic function, no RWMA, mildly elevated PA pressures, mild LAE, small pericardial effusion, mild MR with mild-moderate MAC, moderate AI, and mild AS.   ED course: BP elevated, HR in the 90s-110s, otherwise VSS. Labs notable for electrolytes wnl, Cr 1.05 (baseline 0.9), CBC wnl, INR 1.1, CK 731, HsTrop 3240, Lactate 1.9, COVID-19/influenza negative. EKG showed atrial fibrillation with RVR, rate 110 bpm, non-specific ST-T wave abnormalities essentially unchanged from previous. CT  Head/C-spine/Maxillofacial showed no acute intracranial findings or C-spine fractures, though did reveal chronic microvascular ischemic changes and mildly displaced fracture of the right zygomatic arch/maxillary sinus/frontozygomatic suture. XR pelvis showed no acute findings. CXR showed no acute findings. She was given IVF in the ED. Admission to medicine was requested and cardiology asked to see for elevated troponins.   At the time of this evaluation patient is resting comfortably in the ED stretcher. Niece, [name] is at bedside. [name] reports that Ms. Neitzke has told various stories about the events leading up to this ER visit. [name] reports her aunt has had increased confusion in recent months and had a recent home health evaluation that she reportedly "failed". The patient does not recall any specifics about how she fell but states she was fine and then next thing she knew she was on the floor. [name] reports that Ms. Bushong gets her newspaper first thing in the morning every day and there was only one paper when she went to the house this morning so presumably the fall occurred sometime during the day/evening 07/30/20. When [name] entered the home she found Ms. Bohl sitting in a chair in her bedroom with a vacant stare. After some coaxing she started talking but again did not recall events. [name] noticed a bathroom rug on the floor of her bedroom which wasn't ordinarily there which had a sizable spot of blood on it. [name] observed that there were no abrasions or injuries to her arms and suspected her fall was perhaps a syncopal episode. [name] said there  was no evidence of urine or feces on the patient so presumably she was getting to the bathroom. [name] noticed the patients medicine bottles were askew which was out of the ordinary, with some bottles having the labels ripped off and others with a mixture of various medications in the bottle. It is unclear if this occurred before or after the fall, rasing the  question of whether the patient took too many of her antihypertensive which may have contributed.   Overall the patient has no complaints of chest pain or palpitations. [name] noticed ~6 weeks ago that the patient was more winded when going on errands, and conceded to riding in a wheelchair at times.    Past Medical History:  Diagnosis Date   AF (atrial fibrillation) ([place])    coumadin   Arthritis    "hands" (09/27/2017)   Coronary artery disease 2002   s/p CABG   Diverticulosis    Dyslipidemia    History of chicken pox    IHD (ischemic heart disease)    Myocardial infarction ([place]) ~ 2002   Post herpetic neuralgia 02/2012    Past Surgical History:  Procedure Laterality Date   BREAST LUMPECTOMY Left 1992   excision of fibrous tumor/notes 06/19/2000   CARDIAC CATHETERIZATION  06/08/2000   CATARACT EXTRACTION W/ INTRAOCULAR LENS  IMPLANT, BILATERAL Bilateral 2013   CORONARY ARTERY BYPASS GRAFT  2002   x 4   EYE SURGERY Bilateral 1989   FEMUR IM NAIL Left 09/28/2017   Procedure: INTRAMEDULLARY (IM) NAIL FEMORAL;  Surgeon: Renette Butters, MD;  Location: [facility];  Service: Orthopedics;  Laterality: Left;   INTRAMEDULLARY (IM) NAIL INTERTROCHANTERIC Right 11/22/2018   Procedure: INTRAMEDULLARY (IM) NAIL INTERTROCHANTRIC;  Surgeon: Renette Butters, MD;  Location: [facility];  Service: Orthopedics;  Laterality: Right;     Home Medications:  Prior to Admission medications   Medication Sig Start Date End Date Taking? Authorizing Provider  atenolol (TENORMIN) 25 MG tablet TAKE 1 TABLET BY MOUTH DAILY Patient taking differently: Take 25 mg by mouth daily. 11/28/19  Yes Nahser, Wonda Cheng, MD  losartan (COZAAR) 25 MG tablet Take 1 tablet by mouth once daily Patient taking differently: Take 25 mg by mouth daily. 10/08/19  Yes Nahser, Wonda Cheng, MD  Rivaroxaban (XARELTO) 15 MG TABS tablet Take 1 tablet (15 mg total) by mouth daily with supper. 11/28/19  Yes Nahser, Wonda Cheng, MD  polyethylene glycol  (MIRALAX / GLYCOLAX) 17 g packet Take 17 g by mouth daily as needed for mild constipation. Patient not taking: Reported on 07/31/2020 11/25/18   Domenic Polite, MD    Inpatient Medications: Scheduled Meds:  Continuous Infusions:  lactated ringers     PRN Meds:   Allergies:    Allergies  Allergen Reactions   Crestor [Rosuvastatin Calcium] Other (See Comments)    unknown   Lipitor [Atorvastatin Calcium] Other (See Comments)    unknown   Niaspan [Niacin Er] Other (See Comments)    unknown   Other Other (See Comments)    PER PATIENT SHE DOES NOT TOLERATE NARCOTICS   Pravastatin Other (See Comments)    unknown   Ramipril [Ramipril] Other (See Comments)    Hyperkalemia   Welchol [Colesevelam Hcl] Other (See Comments)    unknown   Zetia [Ezetimibe] Other (See Comments)    unknown    Social History:   Social History   Socioeconomic History   Marital status: Married    Spouse name: Not on file   Number  of children: Not on file   Years of education: Not on file   Highest education level: Not on file  Occupational History   Not on file  Tobacco Use   Smoking status: Never   Smokeless tobacco: Never  Vaping Use   Vaping Use: Never used  Substance and Sexual Activity   Alcohol use: Never   Drug use: Never   Sexual activity: Not on file  Other Topics Concern   Not on file  Social History Narrative   Widow- husband passed away 03-02-2018, hospice involved, he was previously disabled after stroke and she was his long time caregiver   Occupation: retired, was Corporate treasurer   Social Determinants of Radio broadcast assistant Strain: Not on Art therapist Insecurity: Not on file  Transportation Needs: Not on file  Physical Activity: Not on file  Stress: Not on file  Social Connections: Not on file  Intimate Partner Violence: Not on file    Family History:    Family History  Problem Relation Age of Onset   CAD Father 5       MI   CAD Mother 54       MI     ROS:  Please see  the history of present illness.   All other ROS reviewed and negative.     Physical Exam/Data:   Vitals:   07/31/20 1353 07/31/20 1400 07/31/20 1415 07/31/20 1447  BP: (!) 159/77 (!) 166/79 (!) 171/86   Pulse: 98 95 98 95  Resp: _0 SpO2: 100% 100% 99% 100%  Weight:      Height:       No intake or output data in the 24 hours ending 07/31/20 1721 Last 3 Weights 07/31/2020 10/02/2019 11/22/2018  Weight (lbs) 158 lb 1.1 oz 155 lb 12.8 oz 154 lb 15.7 oz  Weight (kg) 71.7 kg 70.67 kg 70.3 kg     Body mass index is 28 kg/m.  General:  Elderly female laying in bed in NAD HEENT: significant bruising to right side of her face Neck: no JVD Vascular: No carotid bruits; distal pulses 2+ bilaterally  Cardiac:  normal S1, S2; RRR; + murmur; no rubs or gallops Lungs:  clear to auscultation bilaterally, no wheezing, rhonchi or rales  Abd: soft, nontender, no hepatomegaly  Ext: no edema Musculoskeletal:  No deformities, BUE and BLE strength normal and equal Skin: warm and dry  Neuro:  CNs 2-12 intact, no focal abnormalities noted Psych:  Normal affect   EKG:  The EKG was personally reviewed and demonstrates:  atrial fibrillation with RVR, rate 110 bpm, non-specific ST-T wave abnormalities essentially unchanged from previous.  Telemetry:  Telemetry was personally reviewed and demonstrates:  atrial fibrillation with rates in the 90s-110s  Relevant CV Studies: Echocardiogram 09/2019: 1. Left ventricular ejection fraction, by estimation, is 55 to 60%. The  left ventricle has normal function. The left ventricle has no regional  wall motion abnormalities. Left ventricular diastolic function could not  be evaluated.   2. Right ventricular systolic function is normal. The right ventricular  size is normal. There is mildly elevated pulmonary artery systolic  pressure.   3. Left atrial size was mildly dilated.   4. A small pericardial effusion is present.   5. The mitral valve is normal in  structure. Mild mitral valve  regurgitation. No evidence of mitral stenosis. Moderate to severe mitral  annular calcification.   6. The aortic valve is tricuspid. There is moderate  calcification of the  aortic valve. There is moderate thickening of the aortic valve. Aortic  valve regurgitation is moderate. Mild aortic valve stenosis.   7. The inferior vena cava is normal in size with greater than 50%  respiratory variability, suggesting right atrial pressure of 3 mmHg.   Comparison(s): Prior images unable to be directly viewed, comparison made  by report only.   Laboratory Data:  High Sensitivity Troponin:   Recent Labs  Lab 07/31/20 1304  TROPONINIHS 3,240*     Chemistry Recent Labs  Lab 07/31/20 1304 07/31/20 1309  NA 137 138  K 4.5 4.4  CL 103  --   CO2 23  --   GLUCOSE 123*  --   BUN 21  --   CREATININE 1.05*  --   CALCIUM 9.2  --   GFRNONAA 51*  --   ANIONGAP 11  --     Recent Labs  Lab 07/31/20 1304  PROT 6.8  ALBUMIN 3.9  AST 50*  ALT 18  ALKPHOS 70  BILITOT 1.6*   Hematology Recent Labs  Lab 07/31/20 1304 07/31/20 1309  WBC 9.7  --   RBC 4.80  --   HGB 13.8 14.6  HCT 43.6 43.0  MCV 90.8  --   MCH 28.8  --   MCHC 31.7  --   RDW 14.2  --   PLT 243  --    BNPNo results for input(s): BNP, PROBNP in the last 168 hours.  DDimer No results for input(s): DDIMER in the last 168 hours.   Radiology/Studies:  CT Head Wo Contrast  Result Date: 07/31/2020 CLINICAL DATA:  Fall today with facial trauma. EXAM: CT HEAD WITHOUT CONTRAST CT MAXILLOFACIAL WITHOUT CONTRAST CT CERVICAL SPINE WITHOUT CONTRAST TECHNIQUE: Multidetector CT imaging of the head, cervical spine, and maxillofacial structures were performed using the standard protocol without intravenous contrast. Multiplanar CT image reconstructions of the cervical spine and maxillofacial structures were also generated. COMPARISON:  None. FINDINGS: CT HEAD FINDINGS Brain: There is no evidence of acute  intracranial hemorrhage, mass lesion, brain edema or extra-axial fluid collection. Mild-to-moderate atrophy with prominence of the ventricles and subarachnoid spaces. There are patchy small vessel ischemic changes in the periventricular white matter and basal ganglia bilaterally. There is no CT evidence of acute cortical infarction. Vascular: Intracranial vascular calcifications. No hyperdense vessel identified. Skull: Negative for fracture or focal lesion. Other: None. CT MAXILLOFACIAL FINDINGS Osseous: There are acute, mildly displaced fractures of the anterior and posterolateral walls of the right maxillary sinus with associated layering blood in the sinus. There is also a mildly comminuted and depressed fracture of the right zygomatic arch. Possible minimal diastasis at the right frontozygomatic suture. No evidence of orbital or other facial fracture. The mandible and temporomandibular joints are intact. There are moderate TMJ degenerative changes bilaterally. Orbits: No evidence of orbital fracture. The globes are intact. Previous lens surgery and scleral banding bilaterally. No evidence of orbital hematoma. The extraocular muscles and optic nerves appear normal. Sinuses: Layering blood in the right maxillary sinus. The additional paranasal sinuses are clear without air-fluid levels. The mastoid air cells and middle ears are clear. Soft tissues: Soft tissue swelling over the right cheek and malar region without focal hematoma or foreign body. CT CERVICAL SPINE FINDINGS Alignment: Straightening with minimal retrolisthesis at C5-6 and anterolisthesis at C7-T1 and T1-2. Skull base and vertebrae: No evidence of acute fracture or traumatic subluxation. Soft tissues and spinal canal: Mild ossification of the ligamentum nuchae. No prevertebral  fluid or swelling. No visible canal hematoma. Disc levels: Multilevel mild spondylosis for age, with disc space narrowing and uncinate spurring greatest at C5-6. Scattered facet  degenerative changes. Upper chest: Bilateral carotid atherosclerosis. Other: None. IMPRESSION: 1. Mildly displaced fractures of the right zygomatic arch, anterior and posterolateral walls of the right maxillary sinus and possible mild diastasis of the right frontozygomatic suture (tripod fracture variant). No other facial fractures. 2. Overlying right facial soft tissue swelling without orbital hematoma or foreign body. 3. No acute intracranial or calvarial findings. Mild atrophy and chronic small vessel ischemic changes. 4. No evidence of acute cervical spine fracture, traumatic subluxation or static signs of instability. Multilevel cervical spondylosis. Electronically Signed   By: Richardean Sale M.D.   On: 07/31/2020 14:22   CT Cervical Spine Wo Contrast  Result Date: 07/31/2020 CLINICAL DATA:  Fall today with facial trauma. EXAM: CT HEAD WITHOUT CONTRAST CT MAXILLOFACIAL WITHOUT CONTRAST CT CERVICAL SPINE WITHOUT CONTRAST TECHNIQUE: Multidetector CT imaging of the head, cervical spine, and maxillofacial structures were performed using the standard protocol without intravenous contrast. Multiplanar CT image reconstructions of the cervical spine and maxillofacial structures were also generated. COMPARISON:  None. FINDINGS: CT HEAD FINDINGS Brain: There is no evidence of acute intracranial hemorrhage, mass lesion, brain edema or extra-axial fluid collection. Mild-to-moderate atrophy with prominence of the ventricles and subarachnoid spaces. There are patchy small vessel ischemic changes in the periventricular white matter and basal ganglia bilaterally. There is no CT evidence of acute cortical infarction. Vascular: Intracranial vascular calcifications. No hyperdense vessel identified. Skull: Negative for fracture or focal lesion. Other: None. CT MAXILLOFACIAL FINDINGS Osseous: There are acute, mildly displaced fractures of the anterior and posterolateral walls of the right maxillary sinus with associated  layering blood in the sinus. There is also a mildly comminuted and depressed fracture of the right zygomatic arch. Possible minimal diastasis at the right frontozygomatic suture. No evidence of orbital or other facial fracture. The mandible and temporomandibular joints are intact. There are moderate TMJ degenerative changes bilaterally. Orbits: No evidence of orbital fracture. The globes are intact. Previous lens surgery and scleral banding bilaterally. No evidence of orbital hematoma. The extraocular muscles and optic nerves appear normal. Sinuses: Layering blood in the right maxillary sinus. The additional paranasal sinuses are clear without air-fluid levels. The mastoid air cells and middle ears are clear. Soft tissues: Soft tissue swelling over the right cheek and malar region without focal hematoma or foreign body. CT CERVICAL SPINE FINDINGS Alignment: Straightening with minimal retrolisthesis at C5-6 and anterolisthesis at C7-T1 and T1-2. Skull base and vertebrae: No evidence of acute fracture or traumatic subluxation. Soft tissues and spinal canal: Mild ossification of the ligamentum nuchae. No prevertebral fluid or swelling. No visible canal hematoma. Disc levels: Multilevel mild spondylosis for age, with disc space narrowing and uncinate spurring greatest at C5-6. Scattered facet degenerative changes. Upper chest: Bilateral carotid atherosclerosis. Other: None. IMPRESSION: 1. Mildly displaced fractures of the right zygomatic arch, anterior and posterolateral walls of the right maxillary sinus and possible mild diastasis of the right frontozygomatic suture (tripod fracture variant). No other facial fractures. 2. Overlying right facial soft tissue swelling without orbital hematoma or foreign body. 3. No acute intracranial or calvarial findings. Mild atrophy and chronic small vessel ischemic changes. 4. No evidence of acute cervical spine fracture, traumatic subluxation or static signs of instability.  Multilevel cervical spondylosis. Electronically Signed   By: Richardean Sale M.D.   On: 07/31/2020 14:22   DG Pelvis Portable  Result Date: 07/31/2020 CLINICAL DATA:  Status post fall. EXAM: PORTABLE PELVIS 1-2 VIEWS COMPARISON:  None. FINDINGS: There is no evidence of pelvic fracture or diastasis. Fixation hardware for healed bilateral intertrochanteric fractures noted. No pelvic bone lesions are seen. Lower lumbar spondylosis. IMPRESSION: No acute abnormality. Electronically Signed   By: Inge Rise M.D.   On: 07/31/2020 15:12   DG Chest Port 1 View  Result Date: 07/31/2020 CLINICAL DATA:  Status post fall. EXAM: PORTABLE CHEST 1 VIEW COMPARISON:  Single-view of the chest 11/21/2018. FINDINGS: The patient is status post CABG. There is cardiomegaly. Aortic atherosclerosis. Lungs clear. No pneumothorax or pleural fluid. No acute or focal bony abnormality. IMPRESSION: No acute disease. Electronically Signed   By: Inge Rise M.D.   On: 07/31/2020 15:13   CT Maxillofacial WO CM  Result Date: 07/31/2020 CLINICAL DATA:  Fall today with facial trauma. EXAM: CT HEAD WITHOUT CONTRAST CT MAXILLOFACIAL WITHOUT CONTRAST CT CERVICAL SPINE WITHOUT CONTRAST TECHNIQUE: Multidetector CT imaging of the head, cervical spine, and maxillofacial structures were performed using the standard protocol without intravenous contrast. Multiplanar CT image reconstructions of the cervical spine and maxillofacial structures were also generated. COMPARISON:  None. FINDINGS: CT HEAD FINDINGS Brain: There is no evidence of acute intracranial hemorrhage, mass lesion, brain edema or extra-axial fluid collection. Mild-to-moderate atrophy with prominence of the ventricles and subarachnoid spaces. There are patchy small vessel ischemic changes in the periventricular white matter and basal ganglia bilaterally. There is no CT evidence of acute cortical infarction. Vascular: Intracranial vascular calcifications. No hyperdense vessel  identified. Skull: Negative for fracture or focal lesion. Other: None. CT MAXILLOFACIAL FINDINGS Osseous: There are acute, mildly displaced fractures of the anterior and posterolateral walls of the right maxillary sinus with associated layering blood in the sinus. There is also a mildly comminuted and depressed fracture of the right zygomatic arch. Possible minimal diastasis at the right frontozygomatic suture. No evidence of orbital or other facial fracture. The mandible and temporomandibular joints are intact. There are moderate TMJ degenerative changes bilaterally. Orbits: No evidence of orbital fracture. The globes are intact. Previous lens surgery and scleral banding bilaterally. No evidence of orbital hematoma. The extraocular muscles and optic nerves appear normal. Sinuses: Layering blood in the right maxillary sinus. The additional paranasal sinuses are clear without air-fluid levels. The mastoid air cells and middle ears are clear. Soft tissues: Soft tissue swelling over the right cheek and malar region without focal hematoma or foreign body. CT CERVICAL SPINE FINDINGS Alignment: Straightening with minimal retrolisthesis at C5-6 and anterolisthesis at C7-T1 and T1-2. Skull base and vertebrae: No evidence of acute fracture or traumatic subluxation. Soft tissues and spinal canal: Mild ossification of the ligamentum nuchae. No prevertebral fluid or swelling. No visible canal hematoma. Disc levels: Multilevel mild spondylosis for age, with disc space narrowing and uncinate spurring greatest at C5-6. Scattered facet degenerative changes. Upper chest: Bilateral carotid atherosclerosis. Other: None. IMPRESSION: 1. Mildly displaced fractures of the right zygomatic arch, anterior and posterolateral walls of the right maxillary sinus and possible mild diastasis of the right frontozygomatic suture (tripod fracture variant). No other facial fractures. 2. Overlying right facial soft tissue swelling without orbital  hematoma or foreign body. 3. No acute intracranial or calvarial findings. Mild atrophy and chronic small vessel ischemic changes. 4. No evidence of acute cervical spine fracture, traumatic subluxation or static signs of instability. Multilevel cervical spondylosis. Electronically Signed   By: Richardean Sale M.D.   On: 07/31/2020 14:22  Assessment and Plan:   1. Fall highly suspicious for syncope: patient presented after unwitnessed fall at home and unclear downtime. Patient does not recall the events but no evidence that she attempted to brace her fall resulting a fracture to her zygomatic arch. Concern for possible syncope. Etiology remains unclear though could be a result of arrhythmogenic syncope, pause, or perhaps medication related hypotension/vasovagal response if patient had misuse of medications given state of her pill bottles. HsTrop 3240 . EKG overall unchanged from previous with non-specific ST-T wave abnormalities.  - Continue to monitor on telemetry - Will check an echocardiogram to evaluate LV function, wall motion, and valve function - Anticipate she will need a 30-day monitor at discharge if telemetry unrevealing this admission.  2. Elevated HsTrop in patient with CAD s/p CABG in 2002: patient presented after an unwitnessed fall at home with unclear downtime. Patient does not recall the events. HsTrop elevated to 3240, CK 731. EKG with non-specific ST-T wave abnormalities not terribly different than previous. Not on aspirin due to need for anticoagulation - Await HsTrop trend, though suspicions high for demand ischemia.  - Will update an echocardiogram - Further work-up pending echo  3. Permanent atrial fibrillation: EKG with atrial fibrillation with RVR with rates in the 110s on arrival, improved to 90s on vitals check. She presents after an unwitnessed fall at home - Continue atenolol for rate control - Continue xarelto for stroke ppx  4. HTN: BP elevated this admission -  Continue home atenolol and losartan   5. HLD: No recent lipids though LDL 124 01/2018. Does not appear to be on a statin anymore and unlikely to receive any meaningful benefit from initiation at this point.  - Will reconsider pending above work-up   Risk Assessment/Risk Scores:   CHA2DS2-VASc Score = 5 This indicates a 7.2% annual risk of stroke. The patient's score is based upon: CHF History: No HTN History: Yes Diabetes History: No Stroke History: No Vascular Disease History: Yes Age Score: 2 Gender Score: 1      For questions or updates, please contact [place] Please consult [email or web address] for contact info under    Signed, Abigail Butts, PA-C  07/31/2020 5:21 PM  Patient seen and examined and agree with Roby Lofts, PA-C as detailed above.  In brief, the patient is a 87 y.o. female with a PMH of CAD s/p CABG in 2002, permanent atrial fibrillation, HTN, and HLD who was brought in by EMS after being found sitting in her chair, confused with bruises and blood on her face. In the ER, patient found to have mildly displaced fracture of the right zygomatic arch/maxillary sinus/frontozygomatic suture with unclear etiology of suspected fall. Labs also notable for CK 731 and Hstrop of 3240. Cardiology is consulted for elevated troponin.  Overall, very unclear picture as to what led to her fall with facial strike and patient has no recollection of the event and remains confused on exam today. As detailed in the HPI, the patient has no signs of trying to "catch herself" which raises concern that she had sudden onset of syncope raising concern for possible cardiac etiology vs vagal event (likely fell in the bathroom). Also her medications had been clearly mixed up and were in disarray so it is also possible she took too much of or mixed her meds. ECG with Afib which is permanent for her. While trop is elevated, the CK is also elevated and unclear how long she was down. Fortunately, ECG  without ischemic  changes and patient denies any chest pain or SOB. Per the niece, had been having some dyspnea on exertion for the past 6 weeks or so but no chest discomfort, SOB at rest, palpitations. No prior history of syncope. Last TTE 09/2019 with LVEF 55-60, mild MR, mild AS.  Will check TTE, trend trop and monitor on tele for arrhythmia or block. Patient is notably DNR/DNI and would wish to pursue conservative therapy as much as possible. Plan discussed at length with the patient and her niece.  GEN: Elderly female, multiple ecchymosis on the face, NAD Neck: No JVD Cardiac: Irregularly irregular, 2/6 harsh systolic murmur.  Respiratory: Clear to auscultation bilaterally. GI: Soft, nontender, non-distended  MS: No edema; No deformity. Neuro:  Confused but moving all extremities with purpose Psych: Normal affect    Plan: -Concern for possible cardiac etiology of syncope given fall with facial strike without signs of her trying to brace her fall vs confusion with medications vs vagal event as suspect syncope occurred in the bathroom -Continue to monitor on tele; likely needs 30day monitor at discharge -Trop elevated in the setting of fall and being down for at least several hours. No ischemic signs on ECG and patient is asymptomatic without chest pain or SOB. Also notably would like to pursue conservative therapy if possible at this time -Trend trop and follow-up TTE to assess for LVEF/WMA; can readdress ischemic work-up if above concerning -Continue home atenolol and xarelto; no signs of bleeding on imaging and hemoglobin normal -Continue home losartan -Holding statin for now as likely minimal benefit in elderly female who has begun to develop worsening memory issues; can readdress pending above -Management of facial fractures, elevated CK per IM  Gwyndolyn Kaufman, MD

## 2020-07-31 NOTE — Progress Notes (Signed)
   07/31/20 1935  Assess: MEWS Score  BP 132/68  Pulse Rate (!) 135  ECG Heart Rate (!) 124  Resp 18  SpO2 92 %  O2 Device Room Air  Assess: MEWS Score  MEWS Temp 0  MEWS Systolic 0  MEWS Pulse 2  MEWS RR 0  MEWS LOC 0  MEWS Score 2  MEWS Score Color Yellow  Assess: if the MEWS score is Yellow or Red  Were vital signs taken at a resting state? No  Focused Assessment Change from prior assessment (see assessment flowsheet)  Early Detection of Sepsis Score *See Row Information* Low  MEWS guidelines implemented *See Row Information* No, previously yellow, continue vital signs every 4 hours  Treat  Pain Scale 0-10  Pain Score 0  Notify: Charge Nurse/RN  Name of Charge Nurse/RN Notified Audie Box, RN  Date Charge Nurse/RN Notified 07/31/20  Time Charge Nurse/RN Notified 1935  Notify: Provider  Provider Name/Title Toniann Fail, MD  Date Provider Notified 07/31/20  Time Provider Notified [phone]  Notification Type Page  Notification Reason Other (Comment) (Pt arrived on unit in Afib RVR)  Provider response See new orders  Date of Provider Response 07/31/20  Time of Provider Response 1935  Pt in Afib RVR notified covering MD and receiving orders - notified pharmacy to verify and send med to floor for administration of medication.

## 2020-08-01 ENCOUNTER — Inpatient Hospital Stay (HOSPITAL_COMMUNITY): Payer: Medicare Other

## 2020-08-01 DIAGNOSIS — I63422 Cerebral infarction due to embolism of left anterior cerebral artery: Secondary | ICD-10-CM

## 2020-08-01 DIAGNOSIS — I639 Cerebral infarction, unspecified: Secondary | ICD-10-CM | POA: Diagnosis not present

## 2020-08-01 DIAGNOSIS — I214 Non-ST elevation (NSTEMI) myocardial infarction: Principal | ICD-10-CM

## 2020-08-01 DIAGNOSIS — S0292XA Unspecified fracture of facial bones, initial encounter for closed fracture: Secondary | ICD-10-CM | POA: Diagnosis not present

## 2020-08-01 DIAGNOSIS — E785 Hyperlipidemia, unspecified: Secondary | ICD-10-CM

## 2020-08-01 DIAGNOSIS — I6340 Cerebral infarction due to embolism of unspecified cerebral artery: Secondary | ICD-10-CM | POA: Insufficient documentation

## 2020-08-01 LAB — COMPREHENSIVE METABOLIC PANEL
ALT: 17 U/L (ref 0–44)
AST: 40 U/L (ref 15–41)
Albumin: 3.2 g/dL — ABNORMAL LOW (ref 3.5–5.0)
Alkaline Phosphatase: 61 U/L (ref 38–126)
Anion gap: 7 (ref 5–15)
BUN: 22 mg/dL (ref 8–23)
CO2: 22 mmol/L (ref 22–32)
Calcium: 8.9 mg/dL (ref 8.9–10.3)
Chloride: 105 mmol/L (ref 98–111)
Creatinine, Ser: 0.88 mg/dL (ref 0.44–1.00)
GFR, Estimated: >60 mL/min (ref >60)
Glucose, Bld: 145 mg/dL — ABNORMAL HIGH (ref 70–99)
Potassium: 4.2 mmol/L (ref 3.5–5.1)
Sodium: 134 mmol/L — ABNORMAL LOW (ref 135–145)
Total Bilirubin: 1.5 mg/dL — ABNORMAL HIGH (ref 0.3–1.2)
Total Protein: 5.6 g/dL — ABNORMAL LOW (ref 6.5–8.1)

## 2020-08-01 LAB — ECHOCARDIOGRAM COMPLETE
AR max vel: 0.95 cm2
AV Area VTI: 0.88 cm2
AV Area mean vel: 0.89 cm2
AV Mean grad: 11 mmHg
AV Peak grad: 20.3 mmHg
Ao pk vel: 2.25 m/s
Calc EF: 26.5 %
Height: 63 in
MV M vel: 5.53 m/s
MV Peak grad: 122.3 mmHg
P 1/2 time: 450 msec
S' Lateral: 4.9 cm
Single Plane A2C EF: 25.3 %
Single Plane A4C EF: 30.4 %
Weight: 2412.71 oz

## 2020-08-01 LAB — LIPID PANEL
Cholesterol: 209 mg/dL — ABNORMAL HIGH (ref 0–200)
HDL: 56 mg/dL (ref >40)
LDL Cholesterol: 140 mg/dL — ABNORMAL HIGH (ref 0–99)
Total CHOL/HDL Ratio: 3.7 RATIO
Triglycerides: 67 mg/dL (ref <150)
VLDL: 13 mg/dL (ref 0–40)

## 2020-08-01 LAB — CBC
HCT: 40.3 % (ref 36.0–46.0)
Hemoglobin: 13.4 g/dL (ref 12.0–15.0)
MCH: 29.3 pg (ref 26.0–34.0)
MCHC: 33.3 g/dL (ref 30.0–36.0)
MCV: 88.2 fL (ref 80.0–100.0)
Platelets: 207 10*3/uL (ref 150–400)
RBC: 4.57 MIL/uL (ref 3.87–5.11)
RDW: 14.2 % (ref 11.5–15.5)
WBC: 9.9 10*3/uL (ref 4.0–10.5)
nRBC: 0 % (ref 0.0–0.2)

## 2020-08-01 LAB — GLUCOSE, CAPILLARY: Glucose-Capillary: 121 mg/dL — ABNORMAL HIGH (ref 70–99)

## 2020-08-01 LAB — CK: Total CK: 448 U/L — ABNORMAL HIGH (ref 38–234)

## 2020-08-01 MED ORDER — STROKE: EARLY STAGES OF RECOVERY BOOK
Freq: Once | Status: AC
  Filled 2020-08-01: qty 1

## 2020-08-01 MED ORDER — FUROSEMIDE 20 MG PO TABS
20.0000 mg | ORAL_TABLET | Freq: Every day | ORAL | Status: DC
  Administered 2020-08-01 – 2020-08-05 (×5): 20 mg via ORAL
  Filled 2020-08-01 (×5): qty 1

## 2020-08-01 MED ORDER — ASPIRIN 325 MG PO TABS
325.0000 mg | ORAL_TABLET | Freq: Every day | ORAL | Status: DC
  Administered 2020-08-01 – 2020-08-02 (×2): 325 mg via ORAL
  Filled 2020-08-01 (×2): qty 1

## 2020-08-01 MED ORDER — IOHEXOL 350 MG/ML SOLN
75.0000 mL | Freq: Once | INTRAVENOUS | Status: AC | PRN
  Administered 2020-08-01: 75 mL via INTRAVENOUS

## 2020-08-01 MED ORDER — ASPIRIN 325 MG PO TABS: 325.0000 mg | ORAL_TABLET | Freq: Every day | ORAL | Status: DC

## 2020-08-01 MED ORDER — ATORVASTATIN CALCIUM 40 MG PO TABS
40.0000 mg | ORAL_TABLET | Freq: Every day | ORAL | Status: DC
  Administered 2020-08-01 – 2020-08-05 (×5): 40 mg via ORAL
  Filled 2020-08-01 (×5): qty 1

## 2020-08-01 MED ORDER — ENOXAPARIN SODIUM 40 MG/0.4ML IJ SOSY
40.0000 mg | PREFILLED_SYRINGE | INTRAMUSCULAR | Status: DC
  Administered 2020-08-01 – 2020-08-03 (×3): 40 mg via SUBCUTANEOUS
  Filled 2020-08-01 (×3): qty 0.4

## 2020-08-01 NOTE — Progress Notes (Signed)
   08/01/20 0422  Assess: MEWS Score  Pulse Rate 91  ECG Heart Rate 92  Resp (!) 25  SpO2 91 %  Assess: MEWS Score  MEWS Temp 0  MEWS Systolic 0  MEWS Pulse 0  MEWS RR 1  MEWS LOC 0  MEWS Score 1  MEWS Score Color Green  Notify: Charge Nurse/RN  Name of Charge Nurse/RN Notified Marissa Hotel manager RN  Date Charge Nurse/RN Notified 08/01/20  Time Charge Nurse/RN Notified 0422  Notify: Rapid Response  Name of Rapid Response RN Notified Mindy  Date Rapid Response Notified 08/01/20  Time Rapid Response Notified 0418  Pt right side weak and unable to answer question and speak - notified Rapid Response for possible stroke.

## 2020-08-01 NOTE — Progress Notes (Signed)
   08/01/20 0518  Assess: MEWS Score  Temp 98.3 F (36.8 C)  BP (!) 155/87  Pulse Rate 92  ECG Heart Rate 93  Resp (!) 24  SpO2 92 %  Assess: MEWS Score  MEWS Temp 0  MEWS Systolic 0  MEWS Pulse 0  MEWS RR 1  MEWS LOC 0  MEWS Score 1  MEWS Score Color Green  Returned from CT with Pt - Per Neurologist Pt having a stroke, needs to be tx. To 3W, for further monitoring. He advised that he will notify the Hospitalist of Tx.

## 2020-08-01 NOTE — Consult Note (Addendum)
NEUROLOGY CONSULTATION NOTE   Date of service: August 01, 2020 Patient Name: Ellen Barajas MRN:  [id number] DOB:  Apr 08, 1928 Reason for consult: "Stroke code for R sided weakness" Requesting Provider: Orland Mustard, MD _ _ _   _ __   _ __ _ _  __ __   _ __   __ _  History of Present Illness  Ellen Barajas is a 87 y.o. female with PMH significant for CAD, permanent Afibb on Xarelto with last dose was last night, HLD, MI, post herpetic neuralgia who is admitted with fall and confusion. She was found to have elevated troponins and NSTEMI.  Per RN was lasting seen moving all extremities at 0000 on 08/01/20. She was eating her dinner earlier an was holding the cup in her R hand per RN.  She was found with R facila droop, R arm and leg weakness and expressive aphasia for which a stroke code was activated.  On review of chart, the ED Triage notes do mention that she had R sided weakness and neglect on arrival but seems like that resolved. She was noted to be moving all extremities per RN and eating her dinner last night.  STAT CT Head without contrast with an ACA territory infarct that appears completed.  mRS: 1 tPA: no, she is on Xarelto, last dose was at 2000 on 07/31/20 Thrombectomy: No LVO, CTA notable for multifocal multivessel stenosis. NIHSS components Score: Comment  1a Level of Conscious 0[x]  1[]  2[]  3[]      1b LOC Questions 0[]  1[]  2[x]       1c LOC Commands 0[x]  1[]  2[]       2 Best Gaze 0[x]  1[]  2[]       3 Visual 0[x]  1[]  2[]  3[]      4 Facial Palsy 0[]  1[x]  2[]  3[]      5a Motor Arm - left 0[x]  1[]  2[]  3[]  4[]  UN[]    5b Motor Arm - Right 0[]  1[]  2[]  3[x]  4[]  UN[]    6a Motor Leg - Left 0[x]  1[]  2[]  3[]  4[]  UN[]    6b Motor Leg - Right 0[]  1[]  2[]  3[]  4[x]  UN[]    7 Limb Ataxia 0[x]  1[]  2[]  3[]  UN[]     8 Sensory 0[]  1[x]  2[]  UN[]      9 Best Language 0[]  1[]  2[x]  3[]      10 Dysarthria 0[x]  1[]  2[]  UN[]      11 Extinct. and Inattention 0[x]  1[]  2[]       TOTAL: 13      ROS   Unable to  obtain secondary to aphasia.  Past History   Past Medical History:  Diagnosis Date   AF (atrial fibrillation) (HCC)    coumadin   Arthritis    "hands" (09/27/2017)   Coronary artery disease 2002   s/p CABG   Diverticulosis    Dyslipidemia    History of chicken pox    IHD (ischemic heart disease)    Myocardial infarction (HCC) ~ 2002   Post herpetic neuralgia 02/2012   Past Surgical History:  Procedure Laterality Date   BREAST LUMPECTOMY Left 1992   excision of fibrous tumor/notes 06/19/2000   CARDIAC CATHETERIZATION  06/08/2000   CATARACT EXTRACTION W/ INTRAOCULAR LENS  IMPLANT, BILATERAL Bilateral 2013   CORONARY ARTERY BYPASS GRAFT  2002   x 4   EYE SURGERY Bilateral 1989   FEMUR IM NAIL Left 09/28/2017   Procedure: INTRAMEDULLARY (IM) NAIL FEMORAL;  Surgeon: Sheral Apley, MD;  Location: [facility] OR;  Service: Orthopedics;  Laterality: Left;  INTRAMEDULLARY (IM) NAIL INTERTROCHANTERIC Right 11/22/2018   Procedure: INTRAMEDULLARY (IM) NAIL INTERTROCHANTRIC;  Surgeon: Sheral ApleyMurphy, Timothy D, MD;  Location: [facility] OR;  Service: Orthopedics;  Laterality: Right;   Family History  Problem Relation Age of Onset   CAD Father [id number]       MI   CAD Mother [id number]       MI   Social History   Socioeconomic History   Marital status: Married    Spouse name: Not on file   Number of children: Not on file   Years of education: Not on file   Highest education level: Not on file  Occupational History   Not on file  Tobacco Use   Smoking status: Never   Smokeless tobacco: Never  Vaping Use   Vaping Use: Never used  Substance and Sexual Activity   Alcohol use: Never   Drug use: Never   Sexual activity: Not on file  Other Topics Concern   Not on file  Social History Narrative   Widow- husband passed away 01/2018, hospice involved, he was previously disabled after stroke and she was his long time caregiver   Occupation: retired, was Public house managerLPN   Social Determinants of Corporate investment bankerHealth   Financial Resource  Strain: Not on file  Food Insecurity: Not on file  Transportation Needs: Not on file  Physical Activity: Not on file  Stress: Not on file  Social Connections: Not on file   Allergies  Allergen Reactions   Crestor [Rosuvastatin Calcium] Other (See Comments)    unknown   Lipitor [Atorvastatin Calcium] Other (See Comments)    unknown   Niaspan [Niacin Er] Other (See Comments)    unknown   Other Other (See Comments)    PER PATIENT SHE DOES NOT TOLERATE NARCOTICS   Pravastatin Other (See Comments)    unknown   Ramipril [Ramipril] Other (See Comments)    Hyperkalemia   Welchol [Colesevelam Hcl] Other (See Comments)    unknown   Zetia [Ezetimibe] Other (See Comments)    unknown    Medications   Medications Prior to Admission  Medication Sig Dispense Refill Last Dose   atenolol (TENORMIN) 25 MG tablet TAKE 1 TABLET BY MOUTH DAILY (Patient taking differently: Take 25 mg by mouth daily.) 90 tablet 2 07/30/2020 at 1830   losartan (COZAAR) 25 MG tablet Take 1 tablet by mouth once daily (Patient taking differently: Take 25 mg by mouth daily.) 90 tablet 3 07/30/2020   Rivaroxaban (XARELTO) 15 MG TABS tablet Take 1 tablet (15 mg total) by mouth daily with supper. 30 tablet 5 07/30/2020 at 1830   polyethylene glycol (MIRALAX / GLYCOLAX) 17 g packet Take 17 g by mouth daily as needed for mild constipation. (Patient not taking: Reported on 07/31/2020) 6 each 0 Not Taking     Vitals   Vitals:   07/31/20 2300 08/01/20 0000 08/01/20 0100 08/01/20 0300  BP: 126/64 129/74 (!) 118/55 116/68  Pulse: 80 91 86 80  Resp: (!) 26 (!) 24 (!) 21 (!) 28  Temp: 98 F (36.7 C)   (!) 97.4 F (36.3 C)  TempSrc: Oral   Axillary  SpO2: 96% 96% 93% 94%  Weight:      Height:         Body mass index is 28 kg/m.  Physical Exam   General: Laying comfortably in bed; in no acute distress.  HENT: Normal oropharynx and mucosa. Normal external appearance of ears and nose.  Neck: Supple, no pain or tenderness  CV: No JVD. No peripheral edema.  Pulmonary: Symmetric Chest rise. Normal respiratory effort.  Abdomen: Soft to touch, non-tender.  Ext: No cyanosis, edema, or deformity  Skin: Bruise on R lower face. Normal palpation of skin.   Musculoskeletal: Normal digits and nails by inspection. No clubbing.   Neurologic Examination  Mental status/Cognition: Alert, oriented to self Speech/language: Rare yes or no but no other speech. Comprehsion intact and follows commands. Unable to repeat. Cranial nerves:   CN II Pupils equal and reactive to light, no VF deficits   CN III,IV,VI EOM intact, no gaze preference or deviation, no nystagmus   CN V normal sensation in V1, V2, and V3 segments bilaterally   CN VII R facial droop   CN VIII normal hearing to speech   CN IX & X normal palatal elevation, no uvular deviation   CN XI 5/5 head turn and 5/5 shoulder shrug bilaterally   CN XII midline tongue protrusion   Motor:  Muscle bulk: poor, tone flaccid in RUE and RLE  Mvmt Root Nerve  Muscle Right Left Comments  SA C5/6 Ax Deltoid 0 5   EF C5/6 Mc Biceps 0 5   EE C6/7/8 Rad Triceps 0 5   WF C6/7 Med FCR     WE C7/8 PIN ECU     F Ab C8/T1 U ADM/FDI 1 5   HF L1/2/3 Fem Illopsoas 0 5   KE L2/3/4 Fem Quad 0 5   DF L4/5 D Peron Tib Ant 0 5   PF S1/2 Tibial Grc/Sol 0 5    Reflexes:  Right Left Comments  Pectoralis      Biceps (C5/6) 2 2   Brachioradialis (C5/6) 2 2    Triceps (C6/7) 2 2    Patellar (L3/4) 2 2    Achilles (S1)      Hoffman      Plantar     Jaw jerk    Sensation:  Light touch Decreased in RLE to touch   Pin prick    Temperature    Vibration   Proprioception    Coordination/Complex Motor:  - Finger to Nose intact on the left - Heel to shin unable to do - Rapid alternating movement are slowed - Gait: unsafe to assess 2/2 weakness.  Labs   CBC:  Recent Labs  Lab 07/31/20 1304 07/31/20 1309 08/01/20 0015  WBC 9.7  --  9.9  NEUTROABS 8.4*  --   --   HGB 13.8  14.6 13.4  HCT 43.6 43.0 40.3  MCV 90.8  --  88.2  PLT 243  --  207    Basic Metabolic Panel:  Lab Results  Component Value Date   NA 134 (L) 08/01/2020   K 4.2 08/01/2020   CO2 22 08/01/2020   GLUCOSE 145 (H) 08/01/2020   BUN 22 08/01/2020   CREATININE 0.88 08/01/2020   CALCIUM 8.9 08/01/2020   GFRNONAA >60 08/01/2020   GFRAA 65 11/27/2019   Lipid Panel:  Lab Results  Component Value Date   LDLCALC 124 (H) 02/06/2018   HgbA1c: No results found for: HGBA1C Urine Drug Screen: No results found for: LABOPIA, COCAINSCRNUR, LABBENZ, AMPHETMU, THCU, LABBARB  Alcohol Level No results found for: [facility]  CT Head without contrast(personally reviewed): Moderate acute infarct in the parasagittal left frontal lobe, ACA territory.  CT angio Head and Neck with contrast(personally reviewed): 1. Negative for large vessel occlusion. 2. Advanced intracranial atherosclerosis. Most notable stenoses are at the right M1 and left  M2 segments. 3. 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 4. 2 mm A-comm aneurysm.  CT perfusion: No mismatch  MRI Brain: pending  Impression   Delano D Cielo is a 87 y.o. female with PMH significant for CAD, permanent Afibb on Xarelto with last dose was last night, HLD, MI, post herpetic neuralgia who is admitted with fall and confusion. She was found to have elevated troponins and NSTEMI. She was found with R facial droop, R arm and leg weakness and expressive aphasia for which a stroke code was activated.  CT head demonstrates a L frontal stroke which probably explains her R leg weakness but not her aphasia or R arm weakness. I suspect that she might also have a MCA territory infarct. She is not a candidate for tPA 2/2 Xarelto, not a candidate for thrombectomy 2/2 no LVO.  Primary Diagnosis:  Cerebral infarction due to embolism of  left anterior cerebral artery.    Secondary Diagnosis: Essential (primary) hypertension, Paroxysmal atrial fibrillation, and Type 2 diabetes mellitus w/o complications  Recommendations   Plan:   - Frequent Neuro checks per stroke unit protocol - Recommend brain imaging with MRI Brain without contrast - Recommend obtaining TTE - Recommend obtaining Lipid panel with LDL - Please start statin if LDL > 70 - Recommend HbA1c - Continue Xarelto for now. - Recommend DVT ppx - SBP goal - permissive hypertension first 24 h < 220/110. Held home meds.  - Recommend Telemetry monitoring for arrythmia - Recommend bedside swallow screen prior to PO intake. - Stroke education booklet - Recommend PT/OT/SLP consult  ______________________________________________________________________  Plan discussed with Dr. Toniann Fail and also notified patient's niece who is her POA.  Thank you for the opportunity to take part in the care of this patient. If you have any further questions, please contact the neurology consultation attending.  Signed,  Erick Blinks [facility] Pager Number [phone] _ _ _   _ __   _ __ _ _  __ __   _ __   __ _

## 2020-08-01 NOTE — Plan of Care (Signed)
Off the floor X2 (MRI) With Echo pending - novel troponin 3K-> 4K with concern for ACA territory completed stroke and in the setting of falling down and prolonged down time - echo is pending - conservative mgmt planned in discussed with family and Dr. Shari Prows  At this time; will work to get echo completed and plan to see patient 08/02/20.  If change in clinical status, please reach out and we are happy to see sooner.  Riley Lam, MD Cardiologist [facility]  [address] [place], [address] [place], [place] [address] [phone]  1:08 PM

## 2020-08-01 NOTE — Progress Notes (Signed)
Inpatient Rehab Admissions Coordinator Note:   Per therapy recommendations, pt was screened for CIR candidacy by Skya Mccullum, MS CCC-SLP. At this time, Pt. Appears to have functional decline and is a potential  candidate for CIR. Will place order for rehab consult per protocol.  Please contact me with questions.   Lizabeth Fellner, MS, CCC-SLP Rehab Admissions Coordinator  [phone] (celll) [phone] (office)  

## 2020-08-01 NOTE — Progress Notes (Addendum)
STROKE TEAM PROGRESS NOTE   ATTENDING NOTE: I reviewed above note and agree with the assessment and plan. Pt was seen and examined.   87 year old female with history of CAD/MI, A. fib on Xarelto, hyperlipidemia admitted for falling at home and found to have non-STEMI.  Initial CT reported no acute abnormality.  However, overnight she was found to have right facial droop, right-sided weakness and expressive aphasia.  CT repeat showed left MCA moderate sized infarct.  CTA head and neck right M1, left M2 stenosis, and left V4 occlusion with distal retrograde flow.  CT perfusion negative.  MRI showed moderate sized left MCA infarct.  EF 25 to 30%, down from 55 to 60% in 09/2019.  LDL 140, A1c pending.  Creatinine 0.88, troponin level elevated.  Cardiology on board for non-STEMI treatment.   On exam, niece is at the bedside.  Patient lying in bed, right side of face and neck as well as periorbital area ecchymosis due to fall.  Patient awake, alert, eyes open, good eye contact, however psychomotor slowing, orientated to place and people, however not orientated to age or time.  Able to repeat and name 3/4, however difficulty with spontaneous speech and expressive aphasia.  May have simple sentences intermittently.  Follows most simple commands but not all of them. No gaze palsy, tracking bilaterally, blinking to visual threat bilaterally.  Mild right facial droop. Tongue midline.  Left upper extremity at least 4/5, right upper extremity 3 -/5 proximal and 2/5 finger movement.  Left lower extremity 3/5, right lower extremity 2/5 proximal and 0/5 distal.  Sensation symmetrical bilaterally, left FTN intact, gait not tested.   Etiology for patient stroke likely due to non-STEMI with low EF at 25 to 30%, down from 55 to 60% in 09/2019.  Patient likely had non-STEMI prior to the fall, not sure whether patient lost consciousness before falling.  Patient does have history of A. fib on Xarelto, therefore can not rule out A.  fib as potential stroke etiology but less likely in this context.  Given moderate sized left ACA infarct, recommend hold off Xarelto for 3 to 5 days to avoid any hemorrhagic conversion.  Agree with cardiology for DAPT at the meantime for non-STEMI treatment.  Patient has listed allergy to statin and Zetia, but talked to niece and she does not know pt has any allergy to those meds. Will start her on lipitor 40.  will follow.   For detailed assessment and plan, please refer to above as I have made changes wherever appropriate.   Marvel Plan, MD PhD Stroke Neurology 08/01/2020 5:37 PM  I discussed with Dr. Jerral Ralph and Dr. Izora Ribas. I spent  30 minutes in total face-to-face time with the patient, more than 50% of which was spent in counseling and coordination of care, reviewing test results, images and medication, and discussing the diagnosis, treatment plan and potential prognosis. This patient's care requiresreview of multiple databases, neurological assessment, discussion with family, other specialists and medical decision making of high complexity.      INTERVAL HISTORY Patient is seen, lying in hospital with right arm on pillow. Her niece is at the bedside providing support. Patient has no complaints at this time.   Vitals:   08/01/20 0518 08/01/20 0600 08/01/20 0644 08/01/20 0811  BP: (!) 155/87 (!) 133/112 (!) 151/90 (!) 157/78  Pulse: 92 91 83 88  Resp: (!) 24 (!) 24 20 12   Temp: 98.3 F (36.8 C)  98.1 F (36.7 C) 98.4 F (36.9 C)  TempSrc: Oral  Oral Oral  SpO2: 92% 92% 91% 97%  Weight:   68.4 kg   Height:   5\' 3"  (1.6 m)    CBC:  Recent Labs  Lab 07/31/20 1304 07/31/20 1309 08/01/20 0015  WBC 9.7  --  9.9  NEUTROABS 8.4*  --   --   HGB 13.8 14.6 13.4  HCT 43.6 43.0 40.3  MCV 90.8  --  88.2  PLT 243  --  207   Basic Metabolic Panel:  Recent Labs  Lab 07/31/20 1304 07/31/20 1309 08/01/20 0015  NA 137 138 134*  K 4.5 4.4 4.2  CL 103  --  105  CO2 23  --  22   GLUCOSE 123*  --  145*  BUN 21  --  22  CREATININE 1.05*  --  0.88  CALCIUM 9.2  --  8.9  MG 2.2  --   --   PHOS 4.5  --   --     Lipid Panel:  Recent Labs  Lab 08/01/20 0015  CHOL 209*  TRIG 67  HDL 56  CHOLHDL 3.7  VLDL 13  LDLCALC 10/02/20*    HgbA1c: No results for input(s): HGBA1C in the last 168 hours. Urine Drug Screen: No results for input(s): LABOPIA, COCAINSCRNUR, LABBENZ, AMPHETMU, THCU, LABBARB in the last 168 hours.  Alcohol Level No results for input(s): ETH in the last 168 hours.  IMAGING past 24 hours CT Head Wo Contrast  Result Date: 07/31/2020 CLINICAL DATA:  Fall today with facial trauma. EXAM: CT HEAD WITHOUT CONTRAST CT MAXILLOFACIAL WITHOUT CONTRAST CT CERVICAL SPINE WITHOUT CONTRAST TECHNIQUE: Multidetector CT imaging of the head, cervical spine, and maxillofacial structures were performed using the standard protocol without intravenous contrast. Multiplanar CT image reconstructions of the cervical spine and maxillofacial structures were also generated. COMPARISON:  None. FINDINGS: CT HEAD FINDINGS Brain: There is no evidence of acute intracranial hemorrhage, mass lesion, brain edema or extra-axial fluid collection. Mild-to-moderate atrophy with prominence of the ventricles and subarachnoid spaces. There are patchy small vessel ischemic changes in the periventricular white matter and basal ganglia bilaterally. There is no CT evidence of acute cortical infarction. Vascular: Intracranial vascular calcifications. No hyperdense vessel identified. Skull: Negative for fracture or focal lesion. Other: None. CT MAXILLOFACIAL FINDINGS Osseous: There are acute, mildly displaced fractures of the anterior and posterolateral walls of the right maxillary sinus with associated layering blood in the sinus. There is also a mildly comminuted and depressed fracture of the right zygomatic arch. Possible minimal diastasis at the right frontozygomatic suture. No evidence of orbital or other  facial fracture. The mandible and temporomandibular joints are intact. There are moderate TMJ degenerative changes bilaterally. Orbits: No evidence of orbital fracture. The globes are intact. Previous lens surgery and scleral banding bilaterally. No evidence of orbital hematoma. The extraocular muscles and optic nerves appear normal. Sinuses: Layering blood in the right maxillary sinus. The additional paranasal sinuses are clear without air-fluid levels. The mastoid air cells and middle ears are clear. Soft tissues: Soft tissue swelling over the right cheek and malar region without focal hematoma or foreign body. CT CERVICAL SPINE FINDINGS Alignment: Straightening with minimal retrolisthesis at C5-6 and anterolisthesis at C7-T1 and T1-2. Skull base and vertebrae: No evidence of acute fracture or traumatic subluxation. Soft tissues and spinal canal: Mild ossification of the ligamentum nuchae. No prevertebral fluid or swelling. No visible canal hematoma. Disc levels: Multilevel mild spondylosis for age, with disc space narrowing and uncinate spurring greatest  at C5-6. Scattered facet degenerative changes. Upper chest: Bilateral carotid atherosclerosis. Other: None. IMPRESSION: 1. Mildly displaced fractures of the right zygomatic arch, anterior and posterolateral walls of the right maxillary sinus and possible mild diastasis of the right frontozygomatic suture (tripod fracture variant). No other facial fractures. 2. Overlying right facial soft tissue swelling without orbital hematoma or foreign body. 3. No acute intracranial or calvarial findings. Mild atrophy and chronic small vessel ischemic changes. 4. No evidence of acute cervical spine fracture, traumatic subluxation or static signs of instability. Multilevel cervical spondylosis. Electronically Signed   By: Carey Bullocks M.D.   On: 07/31/2020 14:22   CT Cervical Spine Wo Contrast  Result Date: 07/31/2020 CLINICAL DATA:  Fall today with facial trauma. EXAM:  CT HEAD WITHOUT CONTRAST CT MAXILLOFACIAL WITHOUT CONTRAST CT CERVICAL SPINE WITHOUT CONTRAST TECHNIQUE: Multidetector CT imaging of the head, cervical spine, and maxillofacial structures were performed using the standard protocol without intravenous contrast. Multiplanar CT image reconstructions of the cervical spine and maxillofacial structures were also generated. COMPARISON:  None. FINDINGS: CT HEAD FINDINGS Brain: There is no evidence of acute intracranial hemorrhage, mass lesion, brain edema or extra-axial fluid collection. Mild-to-moderate atrophy with prominence of the ventricles and subarachnoid spaces. There are patchy small vessel ischemic changes in the periventricular white matter and basal ganglia bilaterally. There is no CT evidence of acute cortical infarction. Vascular: Intracranial vascular calcifications. No hyperdense vessel identified. Skull: Negative for fracture or focal lesion. Other: None. CT MAXILLOFACIAL FINDINGS Osseous: There are acute, mildly displaced fractures of the anterior and posterolateral walls of the right maxillary sinus with associated layering blood in the sinus. There is also a mildly comminuted and depressed fracture of the right zygomatic arch. Possible minimal diastasis at the right frontozygomatic suture. No evidence of orbital or other facial fracture. The mandible and temporomandibular joints are intact. There are moderate TMJ degenerative changes bilaterally. Orbits: No evidence of orbital fracture. The globes are intact. Previous lens surgery and scleral banding bilaterally. No evidence of orbital hematoma. The extraocular muscles and optic nerves appear normal. Sinuses: Layering blood in the right maxillary sinus. The additional paranasal sinuses are clear without air-fluid levels. The mastoid air cells and middle ears are clear. Soft tissues: Soft tissue swelling over the right cheek and malar region without focal hematoma or foreign body. CT CERVICAL SPINE  FINDINGS Alignment: Straightening with minimal retrolisthesis at C5-6 and anterolisthesis at C7-T1 and T1-2. Skull base and vertebrae: No evidence of acute fracture or traumatic subluxation. Soft tissues and spinal canal: Mild ossification of the ligamentum nuchae. No prevertebral fluid or swelling. No visible canal hematoma. Disc levels: Multilevel mild spondylosis for age, with disc space narrowing and uncinate spurring greatest at C5-6. Scattered facet degenerative changes. Upper chest: Bilateral carotid atherosclerosis. Other: None. IMPRESSION: 1. Mildly displaced fractures of the right zygomatic arch, anterior and posterolateral walls of the right maxillary sinus and possible mild diastasis of the right frontozygomatic suture (tripod fracture variant). No other facial fractures. 2. Overlying right facial soft tissue swelling without orbital hematoma or foreign body. 3. No acute intracranial or calvarial findings. Mild atrophy and chronic small vessel ischemic changes. 4. No evidence of acute cervical spine fracture, traumatic subluxation or static signs of instability. Multilevel cervical spondylosis. Electronically Signed   By: Carey Bullocks M.D.   On: 07/31/2020 14:22   DG Pelvis Portable  Result Date: 07/31/2020 CLINICAL DATA:  Status post fall. EXAM: PORTABLE PELVIS 1-2 VIEWS COMPARISON:  None. FINDINGS: There is no evidence  of pelvic fracture or diastasis. Fixation hardware for healed bilateral intertrochanteric fractures noted. No pelvic bone lesions are seen. Lower lumbar spondylosis. IMPRESSION: No acute abnormality. Electronically Signed   By: Drusilla Kanner M.D.   On: 07/31/2020 15:12   CT CEREBRAL PERFUSION W CONTRAST  Result Date: 08/01/2020 CLINICAL DATA:  Stroke suspected EXAM: CT PERFUSION BRAIN TECHNIQUE: Multiphase CT imaging of the brain was performed following IV bolus contrast injection. Subsequent parametric perfusion maps were calculated using RAPID software. CONTRAST:  75mL  OMNIPAQUE IOHEXOL 350 MG/ML SOLN COMPARISON:  Head CT and CTA from earlier today FINDINGS: CT Brain Perfusion Findings: CBF (<30%) Volume: 0mL Perfusion (Tmax>6.0s) volume: 0mL IMPRESSION: No core infarct or cerebral ischemia by standard perfusion thresholds. Electronically Signed   By: Marnee Spring M.D.   On: 08/01/2020 05:13   DG Chest Port 1 View  Result Date: 07/31/2020 CLINICAL DATA:  Status post fall. EXAM: PORTABLE CHEST 1 VIEW COMPARISON:  Single-view of the chest 11/21/2018. FINDINGS: The patient is status post CABG. There is cardiomegaly. Aortic atherosclerosis. Lungs clear. No pneumothorax or pleural fluid. No acute or focal bony abnormality. IMPRESSION: No acute disease. Electronically Signed   By: Drusilla Kanner M.D.   On: 07/31/2020 15:13   CT HEAD CODE STROKE WO CONTRAST  Result Date: 08/01/2020 CLINICAL DATA:  Code stroke.  Left MCA syndrome EXAM: CT HEAD WITHOUT CONTRAST TECHNIQUE: Contiguous axial images were obtained from the base of the skull through the vertex without intravenous contrast. COMPARISON:  07/31/2020 FINDINGS: Brain: Acute infarct in the parasagittal left frontal lobe involving a moderate area, left ACA territory. No MCA territory infarct is seen. Chronic small vessel ischemia in the white matter. Cerebral volume loss with ventriculomegaly. No acute hemorrhage Vascular: No hyperdense vessel Skull: Negative for fracture Sinuses/Orbits: Right facial fractures as described on preceding face CT. Other: These results were called by telephone at the time of interpretation on 08/01/2020 at 4:49 am to provider [facility] , who verbally acknowledged these results. ASPECTS [facility] Stroke Program Early CT Score) - Ganglionic level infarction (caudate, lentiform nuclei, internal capsule, insula, M1-M3 cortex): 7 - Supraganglionic infarction (M4-M6 cortex): 3 Total score (0-10 with 10 being normal): 10 IMPRESSION: 1. Moderate acute infarct in the parasagittal left frontal lobe,  ACA territory. 2. Aging brain. Electronically Signed   By: Marnee Spring M.D.   On: 08/01/2020 04:56   CT ANGIO HEAD CODE STROKE  Result Date: 08/01/2020 CLINICAL DATA:  Acute stroke EXAM: CT ANGIOGRAPHY HEAD AND NECK TECHNIQUE: Multidetector CT imaging of the head and neck was performed using the standard protocol during bolus administration of intravenous contrast. Multiplanar CT image reconstructions and MIPs were obtained to evaluate the vascular anatomy. Carotid stenosis measurements (when applicable) are obtained utilizing NASCET criteria, using the distal internal carotid diameter as the denominator. CONTRAST:  75mL OMNIPAQUE IOHEXOL 350 MG/ML SOLN COMPARISON:  None available FINDINGS: CTA NECK FINDINGS Aortic arch: Atheromatous plaque with 3 vessel branching. Brachiocephalic artery origin is not covered. Right carotid system: Diffuse atheromatous wall thickening of the common carotid extending to the bifurcation. No flow limiting stenosis or ulceration. Left carotid system: Diffuse atheromatous plaque of mixed density involving the common carotid and extending into the proximal ICA. No flow limiting stenosis or ulceration Vertebral arteries: Proximal subclavian atherosclerosis on both sides without flow reducing stenosis. Although limited by motion, there is high-grade left V1 segment stenosis. Even worse stenosis in the left V3 segment with no flow by the dura and reconstitution serving the left  PICA from the basilar. Dominant right vertebral artery is diffusely patent Skeleton: Recent dedicated cervical spine and facial CT. No interval findings. Other neck: No acute finding. Upper chest: Small pleural effusions. Vague nodular density in the left upper lobe with hazy margins, favoring a inflammatory process Review of the MIP images confirms the above findings CTA HEAD FINDINGS Anterior circulation: Left MCA type symptoms. There is intermittent severe narrowing with flow gap involving the left M2 and M3  branches, but flow is seen beyond the major vessels there is actually even worse stenosis at the right M1 segment with accentuated lenticulostriate and downstream reconstitution. Bilateral ACA atherosclerosis with especially right A2 segment narrowing. 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 Posterior circulation: Dominant right vertebral artery shows diffuse atheromatous plaque with no flow limiting stenosis. Reconstituted distal left V4 segment serving the left PICA. The basilar is smooth and diffusely patent. Atheromatous irregularity of the bilateral PCA beyond the P2 segments. Venous sinuses: Unremarkable in the arterial phase Anatomic variants: None significant Review of the MIP images confirms the above findings IMPRESSION: 1. Negative for large vessel occlusion. 2. Advanced intracranial atherosclerosis. Most notable stenoses are at the right M1 and left M2 segments. 3. 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 4. 2 mm A-comm aneurysm. Electronically Signed   By: Marnee SpringJonathon  Watts M.D.   On: 08/01/2020 05:05   CT ANGIO NECK CODE STROKE  Result Date: 08/01/2020 CLINICAL DATA:  Acute stroke EXAM: CT ANGIOGRAPHY HEAD AND NECK TECHNIQUE: Multidetector CT imaging of the head and neck was performed using the standard protocol during bolus administration of intravenous contrast. Multiplanar CT image reconstructions and MIPs were obtained to evaluate the vascular anatomy. Carotid stenosis measurements (when applicable) are obtained utilizing NASCET criteria, using the distal internal carotid diameter as the denominator. CONTRAST:  75mL OMNIPAQUE IOHEXOL 350 MG/ML SOLN COMPARISON:  None available FINDINGS: CTA NECK FINDINGS Aortic arch: Atheromatous plaque with 3 vessel branching. Brachiocephalic artery origin is not covered. Right carotid system: Diffuse atheromatous wall thickening of the  common carotid extending to the bifurcation. No flow limiting stenosis or ulceration. Left carotid system: Diffuse atheromatous plaque of mixed density involving the common carotid and extending into the proximal ICA. No flow limiting stenosis or ulceration Vertebral arteries: Proximal subclavian atherosclerosis on both sides without flow reducing stenosis. Although limited by motion, there is high-grade left V1 segment stenosis. Even worse stenosis in the left V3 segment with no flow by the dura and reconstitution serving the left PICA from the basilar. Dominant right vertebral artery is diffusely patent Skeleton: Recent dedicated cervical spine and facial CT. No interval findings. Other neck: No acute finding. Upper chest: Small pleural effusions. Vague nodular density in the left upper lobe with hazy margins, favoring a inflammatory process Review of the MIP images confirms the above findings CTA HEAD FINDINGS Anterior circulation: Left MCA type symptoms. There is intermittent severe narrowing with flow gap involving the left M2 and M3 branches, but flow is seen beyond the major vessels there is actually even worse stenosis at the right M1 segment with accentuated lenticulostriate and downstream reconstitution. Bilateral ACA atherosclerosis with especially right A2 segment narrowing. 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 Posterior circulation: Dominant right vertebral artery shows diffuse atheromatous plaque with no flow limiting stenosis. Reconstituted distal left V4 segment serving the left PICA. The basilar is smooth and diffusely patent.  Atheromatous irregularity of the bilateral PCA beyond the P2 segments. Venous sinuses: Unremarkable in the arterial phase Anatomic variants: None significant Review of the MIP images confirms the above findings IMPRESSION: 1. Negative for large vessel occlusion. 2. Advanced intracranial atherosclerosis. Most notable stenoses are at the right M1 and  left M2 segments. 3. 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 4. 2 mm A-comm aneurysm. Electronically Signed   By: Marnee Spring M.D.   On: 08/01/2020 05:05   CT Maxillofacial WO CM  Result Date: 07/31/2020 CLINICAL DATA:  Fall today with facial trauma. EXAM: CT HEAD WITHOUT CONTRAST CT MAXILLOFACIAL WITHOUT CONTRAST CT CERVICAL SPINE WITHOUT CONTRAST TECHNIQUE: Multidetector CT imaging of the head, cervical spine, and maxillofacial structures were performed using the standard protocol without intravenous contrast. Multiplanar CT image reconstructions of the cervical spine and maxillofacial structures were also generated. COMPARISON:  None. FINDINGS: CT HEAD FINDINGS Brain: There is no evidence of acute intracranial hemorrhage, mass lesion, brain edema or extra-axial fluid collection. Mild-to-moderate atrophy with prominence of the ventricles and subarachnoid spaces. There are patchy small vessel ischemic changes in the periventricular white matter and basal ganglia bilaterally. There is no CT evidence of acute cortical infarction. Vascular: Intracranial vascular calcifications. No hyperdense vessel identified. Skull: Negative for fracture or focal lesion. Other: None. CT MAXILLOFACIAL FINDINGS Osseous: There are acute, mildly displaced fractures of the anterior and posterolateral walls of the right maxillary sinus with associated layering blood in the sinus. There is also a mildly comminuted and depressed fracture of the right zygomatic arch. Possible minimal diastasis at the right frontozygomatic suture. No evidence of orbital or other facial fracture. The mandible and temporomandibular joints are intact. There are moderate TMJ degenerative changes bilaterally. Orbits: No evidence of orbital fracture. The globes are intact. Previous lens surgery and scleral banding bilaterally. No evidence of orbital  hematoma. The extraocular muscles and optic nerves appear normal. Sinuses: Layering blood in the right maxillary sinus. The additional paranasal sinuses are clear without air-fluid levels. The mastoid air cells and middle ears are clear. Soft tissues: Soft tissue swelling over the right cheek and malar region without focal hematoma or foreign body. CT CERVICAL SPINE FINDINGS Alignment: Straightening with minimal retrolisthesis at C5-6 and anterolisthesis at C7-T1 and T1-2. Skull base and vertebrae: No evidence of acute fracture or traumatic subluxation. Soft tissues and spinal canal: Mild ossification of the ligamentum nuchae. No prevertebral fluid or swelling. No visible canal hematoma. Disc levels: Multilevel mild spondylosis for age, with disc space narrowing and uncinate spurring greatest at C5-6. Scattered facet degenerative changes. Upper chest: Bilateral carotid atherosclerosis. Other: None. IMPRESSION: 1. Mildly displaced fractures of the right zygomatic arch, anterior and posterolateral walls of the right maxillary sinus and possible mild diastasis of the right frontozygomatic suture (tripod fracture variant). No other facial fractures. 2. Overlying right facial soft tissue swelling without orbital hematoma or foreign body. 3. No acute intracranial or calvarial findings. Mild atrophy and chronic small vessel ischemic changes. 4. No evidence of acute cervical spine fracture, traumatic subluxation or static signs of instability. Multilevel cervical spondylosis. Electronically Signed   By: Carey Bullocks M.D.   On: 07/31/2020 14:22    PHYSICAL EXAM: limited by patient condition at time of exam. Niece at bedside.    Mental status/Cognition: Alert, oriented to self and to place. Patient is slow to answer questions, and seems to defer to niece. Paucity of  words noted. Mild right sided facial droop noted and Right side of face with bruising seen in various stages of healing.  Speech/language: minimal  interaction. Answers yes rarely but not sure if patient understands all commands. C Unable to repeat. Cranial nerves:   CN II Pupils equal and reactive to light, no VF deficits   CN III,IV,VI EOM intact, no gaze preference or deviation, no nystagmus   CN V normal sensation in V1, V2, and V3 segments bilaterally   CN VII R facial droop   CN VIII normal hearing to speech   CN IX & X normal palatal elevation, no uvular deviation   CN XI 5/5 head turn and 5/5 shoulder shrug bilaterally   CN XII midline tongue protrusion   Motor:  Muscle bulk: poor, left arm is 4/5 and right arm 3-/5/ left leg is 3+/5 and right leg is 3-/5. Unable to assess heel/shin, but left arm finger to nose is intact.   ASSESSMENT/PLAN Ellen Barajas is a 87 y.o. female with history of atrial fibrillation on xarelto, coronary artery disease s/p CABG, diverticulosis, dyslipidemia,  presenting with  fall and confusion, found to have elevated troponins and NSTEMI. During hospitalization she was found to have right facial droop, right hemiparesis, and expressive aphasia. Code stroke was activated. Initial NIH 13. No tPA because of xarelto.   Stroke TIA:  left ACA branch infarct embolic secondary to  small vessel disease source Code Stroke  CT head No acute abnormality.  Small vessel disease. Atrophy.  ASPECTS 10.      CT perfusion  : No core infarct or cerebral ischemia by standard perfusion thresholds. MRI brain without contrast:   1. Moderate to large acute left ACA branch infarct. 2. Tiny acute infarct in the left anterior temporal cortex. 3. Atrophy and extensive chronic small vessel ischemia.  CTA head ane neck:  1. Negative for large vessel occlusion. 2. Advanced intracranial atherosclerosis. Most notable stenoses are at the right M1 and left M2 segments. 3. 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 4. 2 mm  A-comm aneurysm.    2D Echo pending   LDL 140 HgbA1c No results found for requested labs within last 16109 hours. VTE prophylaxis -      Diet   Diet NPO time specified   Xarelto (rivaroxaban) daily prior to admission, now on Xarelto (rivaroxaban) daily. Plan: Hold xarelto for four days starting today given size of infarct to minimize risk of hemorrhagic conversion. Start aspirin  today. Resume xarelto in four days if pt is neurologically intact  Therapy recommendations:  pending Disposition:  pending  Hypertension Home meds:  tenormin  daily, losartan  daily    Stable Permissive hypertension (OK if < 220/120) but gradually normalize in 5-7 days Long-term BP goal normotensive  Hyperlipidemia Home meds:   none LDL 140, goal < 70   High intensity statin started today-lipitor  daily.    CBGs Recent Labs    08/01/20 0417  GLUCAP 121*    Other Stroke Risk Factors  Advanced Age >/= 65   Coronary artery disease   Other Active Problems NSTEMI: no active chest pain and EKG with no significant changes. Continue beta blockade. Persistent atrial fibrillation: rate controlled. Continue with atenolol.  Dyslipidemia: not on statin and unlikely to receive any meaningful benefit from initiation at this point.   Hospital day # 1     To  contact Stroke Continuity provider, please refer to [email or web address] After hours, contact General Neurology

## 2020-08-01 NOTE — Plan of Care (Signed)
Initiation of care plan   Problem: Education: Goal: Knowledge of General Education information will improve Description: Including pain rating scale, medication(s)/side effects and non-pharmacologic comfort measures Outcome: Progressing   Problem: Health Behavior/Discharge Planning: Goal: Ability to manage health-related needs will improve Outcome: Progressing   Problem: Clinical Measurements: Goal: Ability to maintain clinical measurements within normal limits will improve Outcome: Progressing Goal: Will remain free from infection Outcome: Progressing Goal: Diagnostic test results will improve Outcome: Progressing Goal: Respiratory complications will improve Outcome: Progressing Goal: Cardiovascular complication will be avoided Outcome: Progressing   Problem: Activity: Goal: Risk for activity intolerance will decrease Outcome: Progressing   Problem: Nutrition: Goal: Adequate nutrition will be maintained Outcome: Progressing   Problem: Coping: Goal: Level of anxiety will decrease Outcome: Progressing   Problem: Elimination: Goal: Will not experience complications related to bowel motility Outcome: Progressing Goal: Will not experience complications related to urinary retention Outcome: Progressing   Problem: Pain Managment: Goal: General experience of comfort will improve Outcome: Progressing   Problem: Safety: Goal: Ability to remain free from injury will improve Outcome: Progressing   Problem: Skin Integrity: Goal: Risk for impaired skin integrity will decrease Outcome: Progressing   Problem: Education: Goal: Knowledge of disease or condition will improve Outcome: Progressing Goal: Understanding of medication regimen will improve Outcome: Progressing Goal: Individualized Educational Video(s) Outcome: Progressing   Problem: Activity: Goal: Ability to tolerate increased activity will improve Outcome: Progressing   Problem: Cardiac: Goal: Ability to  achieve and maintain adequate cardiopulmonary perfusion will improve Outcome: Progressing   Problem: Health Behavior/Discharge Planning: Goal: Ability to safely manage health-related needs after discharge will improve Outcome: Progressing

## 2020-08-01 NOTE — Significant Event (Signed)
Rapid Response Event Note   Reason for Call :  Aphasia  Initial Focused Assessment:  Pt lying in bed with eyes open, in no distress. She is not able to move her R arm and is only able to wiggle the toes on her R leg. She has a facial droop and is unable to appropriately answer questions but can follow my commands. [id number]  T-97.4, HR-80, BP-132/55, RR-20, SpO2-94% on RA, NIH-13, CBG-121.  Code Stroke called at 0422 and pt transported to CT: CT head-Moderate acute infarct in L ACA territory CTA/CTP-no LVO. TPA not given-pt on xarelto. Plan freq neuro checks and stroke workup     Interventions:  CBG-121 Code Stroke initiated CTH/CTA/CTP Tx to 3W stroke PCU Plan of Care:  Stroke workup. Continue to monitor pt. Call RRT if further assistance needed.    Event Summary:   MD Notified: Dr. Toniann Fail and Dr. Derry Lory  Call [id number] Arrival [id number] End [id number]  Terrilyn Saver, RN

## 2020-08-01 NOTE — Progress Notes (Signed)
OT Cancellation Note  Patient Details Name: Ellen Barajas MRN: [id number] DOB: April 06, 1928   Cancelled Treatment:    Reason Eval/Treat Not Completed: Patient not medically ready.  Bed rest order in place, PT messaged MD.  OT will continue as appropriate.  Jael Waldorf D Lavante Toso 08/01/2020, 9:12 AM

## 2020-08-01 NOTE — Evaluation (Addendum)
Clinical/Bedside Swallow Evaluation Patient Details  Name: Ellen Barajas MRN: [id number] Date of Birth: 27-Aug-1928  Today's Date: 08/01/2020 Time: SLP Start Time (ACUTE ONLY): 1237 SLP Stop Time (ACUTE ONLY): 1252 SLP Time Calculation (min) (ACUTE ONLY): 15 min  Past Medical History:  Past Medical History:  Diagnosis Date   AF (atrial fibrillation) (HCC)    coumadin   Arthritis    "hands" (09/27/2017)   Coronary artery disease 2002   s/p CABG   Diverticulosis    Dyslipidemia    History of chicken pox    IHD (ischemic heart disease)    Myocardial infarction (HCC) ~ 2002   Post herpetic neuralgia 02/2012   Past Surgical History:  Past Surgical History:  Procedure Laterality Date   BREAST LUMPECTOMY Left 1992   excision of fibrous tumor/notes 06/19/2000   CARDIAC CATHETERIZATION  06/08/2000   CATARACT EXTRACTION W/ INTRAOCULAR LENS  IMPLANT, BILATERAL Bilateral 2013   CORONARY ARTERY BYPASS GRAFT  2002   x 4   EYE SURGERY Bilateral 1989   FEMUR IM NAIL Left 09/28/2017   Procedure: INTRAMEDULLARY (IM) NAIL FEMORAL;  Surgeon: Sheral Apley, MD;  Location: [facility] OR;  Service: Orthopedics;  Laterality: Left;   INTRAMEDULLARY (IM) NAIL INTERTROCHANTERIC Right 11/22/2018   Procedure: INTRAMEDULLARY (IM) NAIL INTERTROCHANTRIC;  Surgeon: Sheral Apley, MD;  Location: [facility] OR;  Service: Orthopedics;  Laterality: Right;   HPI:  87 year old female admitted s/p fall with confusion. Dx with NSTEMI on 7/8. On 7/9, patient with acute onset aphasia, right sided weakness and facial droop. Head CT with Moderate acute infarct in the parasagittal left frontal lobe, ACA territory.   Assessment / Plan / Recommendation Clinical Impression  Pt has mild CN VII deficits on right facial, lingual imprecision with lateralization. She verbalized on command with adequate intensity and clarity; cough weak. She managed approximately 2 oz volumes with cup and straw on 3 trials with stable respirations and one  instance of mildly wet vocal quality. Mastication was unremarkable and pt used ligual sweep. Recommend regular texture, thin liquids, straws allowed, pills whole in puree with follow up for swallow and initiate speech-language-cognitive eval.  SLP Visit Diagnosis: Dysphagia, unspecified (R13.10)    Aspiration Risk  Mild aspiration risk    Diet Recommendation Regular;Thin liquid   Liquid Administration via: Cup;Straw Medication Administration: Whole meds with puree Supervision: Staff to assist with self feeding Compensations: Slow rate;Small sips/bites Postural Changes: Seated upright at 90 degrees    Other  Recommendations Oral Care Recommendations: Oral care BID   Follow up Recommendations Inpatient Rehab      Frequency and Duration min 2x/week  2 weeks       Prognosis Prognosis for Safe Diet Advancement: Good      Swallow Study   General HPI: 87 year old female admitted s/p fall with confusion. Dx with NSTEMI on 7/8. On 7/9, patient with acute onset aphasia, right sided weakness and facial droop. Head CT with Moderate acute infarct in the parasagittal left frontal lobe, ACA territory. Type of Study: Bedside Swallow Evaluation Previous Swallow Assessment: none noted in chart Diet Prior to this Study: NPO Temperature Spikes Noted: No Respiratory Status: Nasal cannula History of Recent Intubation: No Behavior/Cognition: Alert;Cooperative;Pleasant mood Oral Cavity Assessment: Within Functional Limits Oral Care Completed by SLP: No Oral Cavity - Dentition: Adequate natural dentition Vision: Functional for self-feeding Self-Feeding Abilities: Needs assist Patient Positioning: Upright in bed Baseline Vocal Quality: Wet (intermittently wet) Volitional Cough: Weak Volitional Swallow: Able to  elicit    Oral/Motor/Sensory Function Overall Oral Motor/Sensory Function: Mild impairment Facial ROM: Reduced right;Suspected CN VII (facial) dysfunction Facial Symmetry: Abnormal  symmetry right;Suspected CN VII (facial) dysfunction Lingual ROM:  (imprecise lateralization)   Ice Chips Ice chips: Not tested   Thin Liquid Thin Liquid: Within functional limits Presentation: Cup;Straw    Nectar Thick Nectar Thick Liquid: Not tested   Honey Thick Honey Thick Liquid: Not tested   Puree Puree: Within functional limits   Solid     Solid: Within functional limits      Royce Macadamia 08/01/2020,2:23 PM   Breck Coons Lonell Face.Ed Nurse, children's [phone] Office [phone]

## 2020-08-01 NOTE — Progress Notes (Signed)
PROGRESS NOTE        PATIENT DETAILS Name: Ellen Barajas Age: 87 y.o. Sex: female Date of Birth: 05-10-1928 Admit Date: 07/31/2020 Admitting Physician Orland Mustard, MD PCP:No primary care provider on file.  Brief Narrative: Patient is a 87 y.o. female with history of A. fib on Xarelto, CAD s/p CABG, HTN, HLD-who was found at her house by her niece-confused-s/p fall-she was subsequently brought to the ED-where she was found to have non-STEMI, and acute CVA.  See below for further details  Significant events: 7/8>> found by niece-fall-confused-found to have non-STEMI/acute CVA.  Significant studies: 7/8>> CT head: No acute intracranial findings 7/8>> CT C-spine: No fracture/subluxation 7/8>> CT maxillofacial area: Mildly displaced fracture of right zygomatic arch, anterior/posterior lateral walls of the right maxillary sinus 7/8>> CXR: No pneumonia 7/9>> CTA head: Advanced intracranial stenosis-most notable in right M1 and left M2 segments, 2 mm A-comm aneurysm 7/9>> CTA neck: No flow limiting stenosis or ulceration in carotid arteries, occluded left vertebral artery 7/9>> MRI brain: Moderate to large acute left ACA infarct 7/9>> TTE: EF 25-30%, RVSP 71.6.  Left ventricle-anterior, anterolateral and posterior wall are hypokinetic 7/9>>LDL:140  Antimicrobial therapy: None  Microbiology data: 7/8>> COVID/influenza PCR: Negative  Procedures : None  Consults: Neurology, cardiology  DVT Prophylaxis : Prophylactic Lovenox   Subjective: Lying comfortably in bed-significantly dysarthric.  Bruising on the right side of her face.   Assessment/Plan: Non-STEMI: Presented with fall-dysarthric-hence poor historian-however echo with decreased EF and significant wall motion abnormalities.  Currently n.p.o.-significantly dysarthric with a large CVA-discussed with POA-patient's niece at bedside-patient does not desire heroic measures-we will await further input  from cardiology, but suspect given severity of CVA-risk of hemorrhagic conversion-May need to be managed medically.  Acute CVA: Suspicion for embolic CVA in the setting of non-STEMI and new LV dysfunction.  Discussed with Dr. Vinie Sill for few days-await further work-up.  Awaiting input from SLP/PT/OT.  Continues to have significant amount of dysarthria and right-sided hemiparesis.  Newly diagnosed systolic heart failure: Suspect ischemic cardiomyopathy in the setting of non-STEMI.  Volume status is stable.  Await further input from cardiology  Persistent atrial fibrillation: Rate controlled-discussed with neurology-hold Xarelto for a few days given size of CVA.  On aspirin.  History of CAD s/p CABG  HTN: Allow permissive hypertension-follow  Multiple facial fractures: Admitting MD spoke with ENT-recommendations were for supportive care-empiric Keflex to prevent sinusitis (blood in maxillary sinus with pulling).  Mildly elevated CK: Probably either mild rhabdomyolysis or a sequelae of non-STEMI.  Palliative care: DNR in place-discussed with niece at bedside-no heroics-okay with gentle medical treatment-await to see if she has improvement in her neurological status-before deciding how to proceed further.  Diet: Diet Order             Diet regular Room service appropriate? Yes; Fluid consistency: Thin  Diet effective now                    Code Status: Full code   Family Communication: [name] Chambers-niece-[phone]-at bedside  Disposition Plan: Status is: Inpatient  Remains inpatient appropriate because:Inpatient level of care appropriate due to severity of illness  Dispo: The patient is from: Home              Anticipated d/c is to:  TBD  Patient currently is not medically stable to d/c.   Difficult to place patient No    Barriers to Discharge: Acute CVA-dysarthria-right-sided deficits-dysphagia-non-STEMI-work-up in progress-not stable for  discharge.  Antimicrobial agents: Anti-infectives (From admission, onward)    Start     Dose/Rate Route Frequency Ordered Stop   07/31/20 1745  cephALEXin (KEFLEX) capsule 500 mg        500 mg Oral Every 8 hours 07/31/20 1736 08/07/20 1359        Time spent: 45 minutes-Greater than 50% of this time was spent in counseling, explanation of diagnosis, planning of further management, and coordination of care.  MEDICATIONS: Scheduled Meds:  aspirin  325 mg Oral Daily   atenolol  25 mg Oral Daily   cephALEXin  500 mg Oral Q8H   losartan  25 mg Oral Daily   Continuous Infusions:  lactated ringers 100 mL/hr at 08/01/20 0627   PRN Meds:.   PHYSICAL EXAM: Vital signs: Vitals:   08/01/20 0600 08/01/20 0644 08/01/20 0811 08/01/20 1008  BP: (!) 133/112 (!) 151/90 (!) 157/78 111/79  Pulse: 91 83 88 87  Resp: (!) Temp:  98.1 F (36.7 C) 98.4 F (36.9 C) 98.3 F (36.8 C)  TempSrc:  Oral Oral Oral  SpO2: 92% 91% 97% 99%  Weight:  68.4 kg    Height:   (1.6 m)     Filed Weights   07/31/20 1244 08/01/20 0644  Weight: 71.7 kg 68.4 kg   Body mass index is 26.71 kg/m.   Gen Exam: Not in distress-appears dysarthric-at times with expressive aphasia as well. HEENT:atraumatic, normocephalic Chest: B/L clear to auscultation anteriorly CVS:S1S2 regular Abdomen:soft non tender, non distended Extremities:no edema Neurology: Right-sided hemiparesis-around 3/5 in upper/lower extremity Skin: no rash  I have personally reviewed following labs and imaging studies  LABORATORY DATA: CBC: Recent Labs  Lab 07/31/20 1304 07/31/20 1309 08/01/20 0015  WBC 9.7  --  9.9  NEUTROABS 8.4*  --   --   HGB 13.8 14.6 13.4  HCT 43.6 43.0 40.3  MCV 90.8  --  88.2  PLT 243  --  207    Basic Metabolic Panel: Recent Labs  Lab 07/31/20 1304 07/31/20 1309 08/01/20 0015  NA 137 138 134*  K 4.5 4.4 4.2  CL 103  --  105  CO2 23  --  22  GLUCOSE 123*  --  145*  BUN 21  --   22  CREATININE 1.05*  --  0.88  CALCIUM 9.2  --  8.9  MG 2.2  --   --   PHOS 4.5  --   --     GFR: Estimated Creatinine Clearance: 40.2 mL/min (by C-G formula based on SCr of 0.88 mg/dL).  Liver Function Tests: Recent Labs  Lab 07/31/20 1304 08/01/20 0015  AST 50* 40  ALT 18 17  ALKPHOS 70 61  BILITOT 1.6* 1.5*  PROT 6.8 5.6*  ALBUMIN 3.9 3.2*   Recent Labs  Lab 07/31/20 1304  LIPASE 24   No results for input(s): AMMONIA in the last 168 hours.  Coagulation Profile: Recent Labs  Lab 07/31/20 1304  INR 1.1    Cardiac Enzymes: Recent Labs  Lab 07/31/20 1304 08/01/20 0015  CKTOTAL 731* 448*    BNP (last 3 results) No results for input(s): PROBNP in the last 8760 hours.  Lipid Profile: Recent Labs    08/01/20 0015  CHOL 209*  HDL 56  LDLCALC 140*  TRIG 67  CHOLHDL 3.7    Thyroid Function Tests: No results for input(s): TSH, T4TOTAL, FREET4, T3FREE, THYROIDAB in the last 72 hours.  Anemia Panel: No results for input(s): VITAMINB12, FOLATE, FERRITIN, TIBC, IRON, RETICCTPCT in the last 72 hours.  Urine analysis: No results found for: COLORURINE, APPEARANCEUR, LABSPEC, PHURINE, GLUCOSEU, HGBUR, BILIRUBINUR, KETONESUR, PROTEINUR, UROBILINOGEN, NITRITE, LEUKOCYTESUR  Sepsis Labs: Lactic Acid, Venous    Component Value Date/Time   LATICACIDVEN 2.2 (HH) 07/31/2020 2023    MICROBIOLOGY: Recent Results (from the past 240 hour(s))  Resp Panel by RT-PCR (Flu A&B, Covid) Nasopharyngeal Swab     Status: None   Collection Time: 07/31/20  1:26 PM   Specimen: Nasopharyngeal Swab; Nasopharyngeal(NP) swabs in vial transport medium  Result Value Ref Range Status   SARS Coronavirus 2 by RT PCR NEGATIVE NEGATIVE Final    Comment: (NOTE) SARS-CoV-2 target nucleic acids are NOT DETECTED.  The SARS-CoV-2 RNA is generally detectable in upper respiratory specimens during the acute phase of infection. The lowest concentration of SARS-CoV-2 viral copies this assay  can detect is 138 copies/mL. A negative result does not preclude SARS-Cov-2 infection and should not be used as the sole basis for treatment or other patient management decisions. A negative result may occur with  improper specimen collection/handling, submission of specimen other than nasopharyngeal swab, presence of viral mutation(s) within the areas targeted by this assay, and inadequate number of viral copies(<138 copies/mL). A negative result must be combined with clinical observations, patient history, and epidemiological information. The expected result is Negative.  Fact Sheet for Patients:  BloggerCourse.com  Fact Sheet for Healthcare Providers:  SeriousBroker.it  This test is no t yet approved or cleared by the [place] FDA and  has been authorized for detection and/or diagnosis of SARS-CoV-2 by FDA under an Emergency Use Authorization (EUA). This EUA will remain  in effect (meaning this test can be used) for the duration of the COVID-19 declaration under Section 564(b)(1) of the Act, 21 U.S.C.section 360bbb-3(b)(1), unless the authorization is terminated  or revoked sooner.       Influenza A by PCR NEGATIVE NEGATIVE Final   Influenza B by PCR NEGATIVE NEGATIVE Final    Comment: (NOTE) The Xpert Xpress SARS-CoV-2/FLU/RSV plus assay is intended as an aid in the diagnosis of influenza from Nasopharyngeal swab specimens and should not be used as a sole basis for treatment. Nasal washings and aspirates are unacceptable for Xpert Xpress SARS-CoV-2/FLU/RSV testing.  Fact Sheet for Patients: BloggerCourse.com  Fact Sheet for Healthcare Providers: SeriousBroker.it  This test is not yet approved or cleared by the [place] FDA and has been authorized for detection and/or diagnosis of SARS-CoV-2 by FDA under an Emergency Use Authorization (EUA). This EUA will  remain in effect (meaning this test can be used) for the duration of the COVID-19 declaration under Section 564(b)(1) of the Act, 21 U.S.C. section 360bbb-3(b)(1), unless the authorization is terminated or revoked.  Performed at [facility], [address] [place], [place] [address]     RADIOLOGY STUDIES/RESULTS: CT Head Wo Contrast  Result Date: 07/31/2020 CLINICAL DATA:  Fall today with facial trauma. EXAM: CT HEAD WITHOUT CONTRAST CT MAXILLOFACIAL WITHOUT CONTRAST CT CERVICAL SPINE WITHOUT CONTRAST TECHNIQUE: Multidetector CT imaging of the head, cervical spine, and maxillofacial structures were performed using the standard protocol without intravenous contrast. Multiplanar CT image reconstructions of the cervical spine and maxillofacial structures were also generated. COMPARISON:  None. FINDINGS: CT HEAD FINDINGS Brain: There is no  evidence of acute intracranial hemorrhage, mass lesion, brain edema or extra-axial fluid collection. Mild-to-moderate atrophy with prominence of the ventricles and subarachnoid spaces. There are patchy small vessel ischemic changes in the periventricular white matter and basal ganglia bilaterally. There is no CT evidence of acute cortical infarction. Vascular: Intracranial vascular calcifications. No hyperdense vessel identified. Skull: Negative for fracture or focal lesion. Other: None. CT MAXILLOFACIAL FINDINGS Osseous: There are acute, mildly displaced fractures of the anterior and posterolateral walls of the right maxillary sinus with associated layering blood in the sinus. There is also a mildly comminuted and depressed fracture of the right zygomatic arch. Possible minimal diastasis at the right frontozygomatic suture. No evidence of orbital or other facial fracture. The mandible and temporomandibular joints are intact. There are moderate TMJ degenerative changes bilaterally. Orbits: No evidence of orbital fracture. The globes are intact. Previous lens  surgery and scleral banding bilaterally. No evidence of orbital hematoma. The extraocular muscles and optic nerves appear normal. Sinuses: Layering blood in the right maxillary sinus. The additional paranasal sinuses are clear without air-fluid levels. The mastoid air cells and middle ears are clear. Soft tissues: Soft tissue swelling over the right cheek and malar region without focal hematoma or foreign body. CT CERVICAL SPINE FINDINGS Alignment: Straightening with minimal retrolisthesis at C5-6 and anterolisthesis at C7-T1 and T1-2. Skull base and vertebrae: No evidence of acute fracture or traumatic subluxation. Soft tissues and spinal canal: Mild ossification of the ligamentum nuchae. No prevertebral fluid or swelling. No visible canal hematoma. Disc levels: Multilevel mild spondylosis for age, with disc space narrowing and uncinate spurring greatest at C5-6. Scattered facet degenerative changes. Upper chest: Bilateral carotid atherosclerosis. Other: None. IMPRESSION: 1. Mildly displaced fractures of the right zygomatic arch, anterior and posterolateral walls of the right maxillary sinus and possible mild diastasis of the right frontozygomatic suture (tripod fracture variant). No other facial fractures. 2. Overlying right facial soft tissue swelling without orbital hematoma or foreign body. 3. No acute intracranial or calvarial findings. Mild atrophy and chronic small vessel ischemic changes. 4. No evidence of acute cervical spine fracture, traumatic subluxation or static signs of instability. Multilevel cervical spondylosis. Electronically Signed   By: Carey Bullocks M.D.   On: 07/31/2020 14:22   CT Cervical Spine Wo Contrast  Result Date: 07/31/2020 CLINICAL DATA:  Fall today with facial trauma. EXAM: CT HEAD WITHOUT CONTRAST CT MAXILLOFACIAL WITHOUT CONTRAST CT CERVICAL SPINE WITHOUT CONTRAST TECHNIQUE: Multidetector CT imaging of the head, cervical spine, and maxillofacial structures were performed  using the standard protocol without intravenous contrast. Multiplanar CT image reconstructions of the cervical spine and maxillofacial structures were also generated. COMPARISON:  None. FINDINGS: CT HEAD FINDINGS Brain: There is no evidence of acute intracranial hemorrhage, mass lesion, brain edema or extra-axial fluid collection. Mild-to-moderate atrophy with prominence of the ventricles and subarachnoid spaces. There are patchy small vessel ischemic changes in the periventricular white matter and basal ganglia bilaterally. There is no CT evidence of acute cortical infarction. Vascular: Intracranial vascular calcifications. No hyperdense vessel identified. Skull: Negative for fracture or focal lesion. Other: None. CT MAXILLOFACIAL FINDINGS Osseous: There are acute, mildly displaced fractures of the anterior and posterolateral walls of the right maxillary sinus with associated layering blood in the sinus. There is also a mildly comminuted and depressed fracture of the right zygomatic arch. Possible minimal diastasis at the right frontozygomatic suture. No evidence of orbital or other facial fracture. The mandible and temporomandibular joints are intact. There are moderate TMJ degenerative changes  bilaterally. Orbits: No evidence of orbital fracture. The globes are intact. Previous lens surgery and scleral banding bilaterally. No evidence of orbital hematoma. The extraocular muscles and optic nerves appear normal. Sinuses: Layering blood in the right maxillary sinus. The additional paranasal sinuses are clear without air-fluid levels. The mastoid air cells and middle ears are clear. Soft tissues: Soft tissue swelling over the right cheek and malar region without focal hematoma or foreign body. CT CERVICAL SPINE FINDINGS Alignment: Straightening with minimal retrolisthesis at C5-6 and anterolisthesis at C7-T1 and T1-2. Skull base and vertebrae: No evidence of acute fracture or traumatic subluxation. Soft tissues and  spinal canal: Mild ossification of the ligamentum nuchae. No prevertebral fluid or swelling. No visible canal hematoma. Disc levels: Multilevel mild spondylosis for age, with disc space narrowing and uncinate spurring greatest at C5-6. Scattered facet degenerative changes. Upper chest: Bilateral carotid atherosclerosis. Other: None. IMPRESSION: 1. Mildly displaced fractures of the right zygomatic arch, anterior and posterolateral walls of the right maxillary sinus and possible mild diastasis of the right frontozygomatic suture (tripod fracture variant). No other facial fractures. 2. Overlying right facial soft tissue swelling without orbital hematoma or foreign body. 3. No acute intracranial or calvarial findings. Mild atrophy and chronic small vessel ischemic changes. 4. No evidence of acute cervical spine fracture, traumatic subluxation or static signs of instability. Multilevel cervical spondylosis. Electronically Signed   By: Carey BullocksWilliam  Veazey M.D.   On: 07/31/2020 14:22   MR BRAIN WO CONTRAST  Result Date: 08/01/2020 CLINICAL DATA:  Acute stroke suspected EXAM: MRI HEAD WITHOUT CONTRAST TECHNIQUE: Multiplanar, multiecho pulse sequences of the brain and surrounding structures were obtained without intravenous contrast. COMPARISON:  CT and CTA from earlier today FINDINGS: Brain: Moderate to large area of cortically based infarction in the high and parasagittal left frontal lobe, best aligned to the ACA distribution. A tiny cortically based infarct is seen at the cortex of the anterior left temporal lobe. Confluent chronic small vessel ischemia in the cerebral white matter and pons. No acute hemorrhage, hydrocephalus, or masslike finding. Generalized brain atrophy. Vascular: Recent CTA.  No additional findings. Skull and upper cervical spine: Normal marrow signal Sinuses/Orbits: Right maxillary hemosinus, known. Bilateral cataract resection. IMPRESSION: 1. Moderate to large acute left ACA branch infarct. 2. Tiny  acute infarct in the left anterior temporal cortex. 3. Atrophy and extensive chronic small vessel ischemia. Electronically Signed   By: Marnee SpringJonathon  Watts M.D.   On: 08/01/2020 12:00   DG Pelvis Portable  Result Date: 07/31/2020 CLINICAL DATA:  Status post fall. EXAM: PORTABLE PELVIS 1-2 VIEWS COMPARISON:  None. FINDINGS: There is no evidence of pelvic fracture or diastasis. Fixation hardware for healed bilateral intertrochanteric fractures noted. No pelvic bone lesions are seen. Lower lumbar spondylosis. IMPRESSION: No acute abnormality. Electronically Signed   By: Drusilla Kannerhomas  Dalessio M.D.   On: 07/31/2020 15:12   CT CEREBRAL PERFUSION W CONTRAST  Result Date: 08/01/2020 CLINICAL DATA:  Stroke suspected EXAM: CT PERFUSION BRAIN TECHNIQUE: Multiphase CT imaging of the brain was performed following IV bolus contrast injection. Subsequent parametric perfusion maps were calculated using RAPID software. CONTRAST:  75mL OMNIPAQUE IOHEXOL 350 MG/ML SOLN COMPARISON:  Head CT and CTA from earlier today FINDINGS: CT Brain Perfusion Findings: CBF (<30%) Volume: 0mL Perfusion (Tmax>6.0s) volume: 0mL IMPRESSION: No core infarct or cerebral ischemia by standard perfusion thresholds. Electronically Signed   By: Marnee SpringJonathon  Watts M.D.   On: 08/01/2020 05:13   DG Chest Port 1 View  Result Date: 07/31/2020 CLINICAL DATA:  Status post fall. EXAM: PORTABLE CHEST 1 VIEW COMPARISON:  Single-view of the chest 11/21/2018. FINDINGS: The patient is status post CABG. There is cardiomegaly. Aortic atherosclerosis. Lungs clear. No pneumothorax or pleural fluid. No acute or focal bony abnormality. IMPRESSION: No acute disease. Electronically Signed   By: Drusilla Kanner M.D.   On: 07/31/2020 15:13   CT HEAD CODE STROKE WO CONTRAST  Result Date: 08/01/2020 CLINICAL DATA:  Code stroke.  Left MCA syndrome EXAM: CT HEAD WITHOUT CONTRAST TECHNIQUE: Contiguous axial images were obtained from the base of the skull through the vertex without  intravenous contrast. COMPARISON:  07/31/2020 FINDINGS: Brain: Acute infarct in the parasagittal left frontal lobe involving a moderate area, left ACA territory. No MCA territory infarct is seen. Chronic small vessel ischemia in the white matter. Cerebral volume loss with ventriculomegaly. No acute hemorrhage Vascular: No hyperdense vessel Skull: Negative for fracture Sinuses/Orbits: Right facial fractures as described on preceding face CT. Other: These results were called by telephone at the time of interpretation on 08/01/2020 at 4:49 am to provider [facility] , who verbally acknowledged these results. ASPECTS [facility] Stroke Program Early CT Score) - Ganglionic level infarction (caudate, lentiform nuclei, internal capsule, insula, M1-M3 cortex): 7 - Supraganglionic infarction (M4-M6 cortex): 3 Total score (0-10 with 10 being normal): 10 IMPRESSION: 1. Moderate acute infarct in the parasagittal left frontal lobe, ACA territory. 2. Aging brain. Electronically Signed   By: Marnee Spring M.D.   On: 08/01/2020 04:56   CT ANGIO HEAD CODE STROKE  Result Date: 08/01/2020 CLINICAL DATA:  Acute stroke EXAM: CT ANGIOGRAPHY HEAD AND NECK TECHNIQUE: Multidetector CT imaging of the head and neck was performed using the standard protocol during bolus administration of intravenous contrast. Multiplanar CT image reconstructions and MIPs were obtained to evaluate the vascular anatomy. Carotid stenosis measurements (when applicable) are obtained utilizing NASCET criteria, using the distal internal carotid diameter as the denominator. CONTRAST:  75mL OMNIPAQUE IOHEXOL 350 MG/ML SOLN COMPARISON:  None available FINDINGS: CTA NECK FINDINGS Aortic arch: Atheromatous plaque with 3 vessel branching. Brachiocephalic artery origin is not covered. Right carotid system: Diffuse atheromatous wall thickening of the common carotid extending to the bifurcation. No flow limiting stenosis or ulceration. Left carotid system: Diffuse  atheromatous plaque of mixed density involving the common carotid and extending into the proximal ICA. No flow limiting stenosis or ulceration Vertebral arteries: Proximal subclavian atherosclerosis on both sides without flow reducing stenosis. Although limited by motion, there is high-grade left V1 segment stenosis. Even worse stenosis in the left V3 segment with no flow by the dura and reconstitution serving the left PICA from the basilar. Dominant right vertebral artery is diffusely patent Skeleton: Recent dedicated cervical spine and facial CT. No interval findings. Other neck: No acute finding. Upper chest: Small pleural effusions. Vague nodular density in the left upper lobe with hazy margins, favoring a inflammatory process Review of the MIP images confirms the above findings CTA HEAD FINDINGS Anterior circulation: Left MCA type symptoms. There is intermittent severe narrowing with flow gap involving the left M2 and M3 branches, but flow is seen beyond the major vessels there is actually even worse stenosis at the right M1 segment with accentuated lenticulostriate and downstream reconstitution. Bilateral ACA atherosclerosis with especially right A2 segment narrowing. 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 Posterior circulation: Dominant right vertebral artery shows diffuse atheromatous plaque with no flow limiting stenosis. Reconstituted distal left V4 segment serving the left PICA. The basilar is smooth  and diffusely patent. Atheromatous irregularity of the bilateral PCA beyond the P2 segments. Venous sinuses: Unremarkable in the arterial phase Anatomic variants: None significant Review of the MIP images confirms the above findings IMPRESSION: 1. Negative for large vessel occlusion. 2. Advanced intracranial atherosclerosis. Most notable stenoses are at the right M1 and left M2 segments. 3. Atherosclerosis in the neck most notably affecting the left vertebral artery which is occluded  by the dura and reconstitutes from the basilar. 4. 2 mm A-comm aneurysm. Electronically Signed   By: Marnee Spring M.D.   On: 08/01/2020 05:05   CT ANGIO NECK CODE STROKE  Result Date: 08/01/2020 CLINICAL DATA:  Acute stroke EXAM: CT ANGIOGRAPHY HEAD AND NECK TECHNIQUE: Multidetector CT imaging of the head and neck was performed using the standard protocol during bolus administration of intravenous contrast. Multiplanar CT image reconstructions and MIPs were obtained to evaluate the vascular anatomy. Carotid stenosis measurements (when applicable) are obtained utilizing NASCET criteria, using the distal internal carotid diameter as the denominator. CONTRAST:  75mL OMNIPAQUE IOHEXOL 350 MG/ML SOLN COMPARISON:  None available FINDINGS: CTA NECK FINDINGS Aortic arch: Atheromatous plaque with 3 vessel branching. Brachiocephalic artery origin is not covered. Right carotid system: Diffuse atheromatous wall thickening of the common carotid extending to the bifurcation. No flow limiting stenosis or ulceration. Left carotid system: Diffuse atheromatous plaque of mixed density involving the common carotid and extending into the proximal ICA. No flow limiting stenosis or ulceration Vertebral arteries: Proximal subclavian atherosclerosis on both sides without flow reducing stenosis. Although limited by motion, there is high-grade left V1 segment stenosis. Even worse stenosis in the left V3 segment with no flow by the dura and reconstitution serving the left PICA from the basilar. Dominant right vertebral artery is diffusely patent Skeleton: Recent dedicated cervical spine and facial CT. No interval findings. Other neck: No acute finding. Upper chest: Small pleural effusions. Vague nodular density in the left upper lobe with hazy margins, favoring a inflammatory process Review of the MIP images confirms the above findings CTA HEAD FINDINGS Anterior circulation: Left MCA type symptoms. There is intermittent severe narrowing  with flow gap involving the left M2 and M3 branches, but flow is seen beyond the major vessels there is actually even worse stenosis at the right M1 segment with accentuated lenticulostriate and downstream reconstitution. Bilateral ACA atherosclerosis with especially right A2 segment narrowing. 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 Posterior circulation: Dominant right vertebral artery shows diffuse atheromatous plaque with no flow limiting stenosis. Reconstituted distal left V4 segment serving the left PICA. The basilar is smooth and diffusely patent. Atheromatous irregularity of the bilateral PCA beyond the P2 segments. Venous sinuses: Unremarkable in the arterial phase Anatomic variants: None significant Review of the MIP images confirms the above findings IMPRESSION: 1. Negative for large vessel occlusion. 2. Advanced intracranial atherosclerosis. Most notable stenoses are at the right M1 and left M2 segments. 3. 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 4. 2 mm A-comm aneurysm. Electronically Signed   By: Marnee Spring M.D.   On: 08/01/2020 05:05   CT Maxillofacial WO CM  Result Date: 07/31/2020 CLINICAL DATA:  Fall today with facial trauma. EXAM: CT HEAD WITHOUT CONTRAST CT MAXILLOFACIAL WITHOUT CONTRAST CT CERVICAL SPINE WITHOUT CONTRAST TECHNIQUE: Multidetector CT imaging of the head, cervical spine, and maxillofacial structures were performed using the standard protocol without intravenous contrast. Multiplanar CT image reconstructions  of the cervical spine and maxillofacial structures were also generated. COMPARISON:  None. FINDINGS: CT HEAD FINDINGS Brain: There is no evidence of acute intracranial hemorrhage, mass lesion, brain edema or extra-axial fluid collection. Mild-to-moderate atrophy with prominence of the ventricles and subarachnoid spaces. There are patchy small  vessel ischemic changes in the periventricular white matter and basal ganglia bilaterally. There is no CT evidence of acute cortical infarction. Vascular: Intracranial vascular calcifications. No hyperdense vessel identified. Skull: Negative for fracture or focal lesion. Other: None. CT MAXILLOFACIAL FINDINGS Osseous: There are acute, mildly displaced fractures of the anterior and posterolateral walls of the right maxillary sinus with associated layering blood in the sinus. There is also a mildly comminuted and depressed fracture of the right zygomatic arch. Possible minimal diastasis at the right frontozygomatic suture. No evidence of orbital or other facial fracture. The mandible and temporomandibular joints are intact. There are moderate TMJ degenerative changes bilaterally. Orbits: No evidence of orbital fracture. The globes are intact. Previous lens surgery and scleral banding bilaterally. No evidence of orbital hematoma. The extraocular muscles and optic nerves appear normal. Sinuses: Layering blood in the right maxillary sinus. The additional paranasal sinuses are clear without air-fluid levels. The mastoid air cells and middle ears are clear. Soft tissues: Soft tissue swelling over the right cheek and malar region without focal hematoma or foreign body. CT CERVICAL SPINE FINDINGS Alignment: Straightening with minimal retrolisthesis at C5-6 and anterolisthesis at C7-T1 and T1-2. Skull base and vertebrae: No evidence of acute fracture or traumatic subluxation. Soft tissues and spinal canal: Mild ossification of the ligamentum nuchae. No prevertebral fluid or swelling. No visible canal hematoma. Disc levels: Multilevel mild spondylosis for age, with disc space narrowing and uncinate spurring greatest at C5-6. Scattered facet degenerative changes. Upper chest: Bilateral carotid atherosclerosis. Other: None. IMPRESSION: 1. Mildly displaced fractures of the right zygomatic arch, anterior and posterolateral walls  of the right maxillary sinus and possible mild diastasis of the right frontozygomatic suture (tripod fracture variant). No other facial fractures. 2. Overlying right facial soft tissue swelling without orbital hematoma or foreign body. 3. No acute intracranial or calvarial findings. Mild atrophy and chronic small vessel ischemic changes. 4. No evidence of acute cervical spine fracture, traumatic subluxation or static signs of instability. Multilevel cervical spondylosis. Electronically Signed   By: Carey Bullocks M.D.   On: 07/31/2020 14:22     LOS: 1 day   Jeoffrey Massed, MD  [facility]    To contact the attending provider between 7A-7P or the covering provider during after hours 7P-7A, please log into the web site [email or web address] and access using universal [place] password for that web site. If you do not have the password, please call the hospital operator.  08/01/2020, 1:51 PM

## 2020-08-01 NOTE — Evaluation (Signed)
Occupational Therapy Evaluation Patient Details Name: Ellen Barajas MRN: [id number] DOB: [birth year] Today's Date: 08/01/2020    History of Present Illness 87 y/o female presented to ED on 7/8 after being found down for unknown time. EMS reports R facial droop, R sided weakness with neglect which resolved. Elevated troponin and NSTEMI in ED. In ED, RN found patient to have R facial droop, R arm and leg weakness, and expressive aphasia. 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 MRI pending. PMH: Afib on coumadin, CAD s/p CABG, HLD, MI 2022, diverticulosis   Clinical Impression   Patient admitted with the diagnosis above.  PTA she lived alone with check-in s from family.  Family and potential hired PCA will be able to provide 24 hour assist as needed at home.  Barriers are listed below.  Currently she is needing up to Min A of 2 for basic transfers and up to Max A for lower body ADL.  CIR is recommended given her prior level of function, desire to regain independence and capacity to tolerate higher level intensity of rehab.  OT to follow acutely.      Follow Up Recommendations  CIR    Equipment Recommendations  3 in 1 bedside commode;Tub/shower seat;Wheelchair (measurements OT);Wheelchair cushion (measurements OT)    Recommendations for Other Services Rehab consult     Precautions / Restrictions Precautions Precautions: Fall Precaution Comments: expressive aphasia Restrictions Weight Bearing Restrictions: No      Mobility Bed Mobility Overal bed mobility: Needs Assistance Bed Mobility: Rolling;Sidelying to Sit;Sit to Supine Rolling: Min assist Sidelying to sit: Mod assist   Sit to supine: Min assist   General bed mobility comments: minA to initiate rolling to R. modA for trunk elevation and bringing LEs towards EOB. MinA to return to supine with assist with R LE    Transfers Overall transfer level: Needs assistance Equipment used: 2 person hand held assist Transfers: Sit to/from  [employer] Sit to Stand: Min assist;+2 physical assistance;+2 safety/equipment;Mod assist Stand pivot transfers: Min assist;+2 physical assistance;+2 safety/equipment;Mod assist       General transfer comment: minA+2 or modA+1 for sit to stand transfer and stand pivot. Able to slide R LE on floor to advance. Assist for weight shifting to L to offweight R LE    Balance Overall balance assessment: Needs assistance Sitting-balance support: No upper extremity supported;Feet supported Sitting balance-Leahy Scale: Good     Standing balance support: Bilateral upper extremity supported;During functional activity Standing balance-Leahy Scale: Poor Standing balance comment: assist from OT/PT                           ADL either performed or assessed with clinical judgement   ADL Overall ADL's : Needs assistance/impaired     Grooming: Wash/dry hands;Moderate assistance;Sitting           Upper Body Dressing : Moderate assistance;Sitting   Lower Body Dressing: Maximal assistance;Sitting/lateral leans               Functional mobility during ADLs: Maximal assistance;Moderate assistance;Rolling walker;Cueing for safety;Cueing for sequencing       Vision Patient Visual Report: No change from baseline       Perception     Praxis      Pertinent Vitals/Pain Pain Assessment: Faces Faces Pain Scale: No hurt Pain Intervention(s): Monitored during session     Hand Dominance Right   Extremity/Trunk Assessment Upper Extremity Assessment Upper Extremity Assessment: RUE deficits/detail RUE  Deficits / Details: 2+5 MMT for grip, flaccid to remaining pivots to R arm. RUE Sensation: WNL RUE Coordination: decreased fine motor;decreased gross motor   Lower Extremity Assessment Lower Extremity Assessment: Defer to PT evaluation RLE Deficits / Details: Difficulty activate LE on command but functionally 2/5 RLE Sensation: decreased proprioception RLE  Coordination: decreased fine motor;decreased gross motor   Cervical / Trunk Assessment Cervical / Trunk Assessment: Kyphotic   Communication Communication Communication: Expressive difficulties   Cognition Arousal/Alertness: Awake/alert Behavior During Therapy: WFL for tasks assessed/performed Overall Cognitive Status: History of cognitive impairments - at baseline Area of Impairment: Following commands                       Following Commands: Follows one step commands consistently;Follows one step commands with increased time       General Comments: Niece states decreased ST memory, but following commands with increased time.  Answers questions better with choices.   General Comments  On 2L O2 [place] on arrival with spO2 maintaining >90% throughout    Exercises     Shoulder Instructions      Home Living Family/patient expects to be discharged to:: Inpatient rehab                                 Additional Comments: lives alone at baseline with family check-ins from niece      Prior Functioning/Environment Level of Independence: Independent with assistive device(s)        Comments: uses quad cane for ambulation, does not drive. Neice brings grocery.  Patient continued to be independent for ADL completion, med management, meal prep and home management.  Niece assists with bill payment and community mobility.        OT Problem List: Decreased strength;Decreased range of motion;Decreased activity tolerance;Impaired balance (sitting and/or standing);Decreased knowledge of use of DME or AE;Decreased safety awareness;Impaired UE functional use      OT Treatment/Interventions: Self-care/ADL training;Therapeutic exercise;Neuromuscular education;DME and/or AE instruction;Balance training;Patient/family education;Therapeutic activities    OT Goals(Current goals can be found in the care plan section) Acute Rehab OT Goals Patient Stated Goal: Regain  independence per Niece OT Goal Formulation: With family Time For Goal Achievement: 08/15/20 Potential to Achieve Goals: Good ADL Goals Pt Will Perform Grooming: with supervision;sitting Pt Will Perform Upper Body Bathing: with min assist;sitting Pt Will Perform Upper Body Dressing: with min guard assist;sitting Pt Will Transfer to Toilet: with min assist;stand pivot transfer;bedside commode Pt Will Perform Toileting - Clothing Manipulation and hygiene: with min assist;sitting/lateral leans  OT Frequency: Min 2X/week   Barriers to D/C:    none noted       Co-evaluation PT/OT/SLP Co-Evaluation/Treatment: Yes Reason for Co-Treatment: Complexity of the patient's impairments (multi-system involvement);For patient/therapist safety;To address functional/ADL transfers PT goals addressed during session: Mobility/safety with mobility;Balance OT goals addressed during session: ADL's and self-care      AM-PAC OT "6 Clicks" Daily Activity     Outcome Measure Help from another person eating meals?: A Little Help from another person taking care of personal grooming?: A Lot Help from another person toileting, which includes using toliet, bedpan, or urinal?: A Lot Help from another person bathing (including washing, rinsing, drying)?: A Lot Help from another person to put on and taking off regular upper body clothing?: A Lot Help from another person to put on and taking off regular lower body clothing?: A Lot 6 Click  Score: 13   End of Session Equipment Utilized During Treatment: Gait belt;Oxygen Nurse Communication: Mobility status  Activity Tolerance: Patient tolerated treatment well Patient left: in bed;with call bell/phone within reach;with nursing/sitter in room  OT Visit Diagnosis: Unsteadiness on feet (R26.81);Other abnormalities of gait and mobility (R26.89);Muscle weakness (generalized) (M62.81);Hemiplegia and hemiparesis Hemiplegia - Right/Left: Right Hemiplegia -  dominant/non-dominant: Dominant Hemiplegia - caused by: Cerebral infarction                Time: 2119-4174 OT Time Calculation (min): 30 min Charges:  OT General Charges $OT Visit: 1 Visit OT Evaluation $OT Eval Moderate Complexity: 1 Mod  08/01/2020  Rich, OTR/L  Acute Rehabilitation Services  Office:  [phone]   Suzanna Obey 08/01/2020, 12:31 PM

## 2020-08-01 NOTE — Progress Notes (Signed)
  Echocardiogram 2D Echocardiogram has been performed.  Stark Bray Swaim 08/01/2020, 1:34 PM

## 2020-08-01 NOTE — Evaluation (Signed)
Physical Therapy Evaluation Patient Details Name: Ellen Barajas MRN: [id number] DOB: 07-15-1928 Today's Date: 08/01/2020   History of Present Illness  87 y/o female presented to ED on 7/8 after being found down for unknown time. EMS reports R facial droop, R sided weakness with neglect which resolved. Elevated troponin and NSTEMI in ED. In ED, RN found patient to have R facial droop, R arm and leg weakness, and expressive aphasia. 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 MRI pending. PMH: Afib on coumadin, CAD s/p CABG, HLD, MI 2022, diverticulosis  Clinical Impression  PTA, patient lives alone and niece reports independence with use of quad cane at home. Patient presents with expressive difficulties, impaired balance, decreased activity tolerance,  R sided weakness, and impaired coordination. Patient currently requiring overall minA+2 for bed mobility and transfers. Able to advance R LE with assist for weight shifting. Patient will benefit from skilled PT services during acute stay to address listed deficits. Recommend CIR for intensive therapies to assist with maximizing functional mobility and safety.     Follow Up Recommendations CIR    Equipment Recommendations  Other (comment) (TBD)    Recommendations for Other Services Rehab consult     Precautions / Restrictions Precautions Precautions: Fall Precaution Comments: expressive aphasia Restrictions Weight Bearing Restrictions: No      Mobility  Bed Mobility Overal bed mobility: Needs Assistance Bed Mobility: Rolling;Sidelying to Sit;Sit to Supine Rolling: Min assist Sidelying to sit: Mod assist   Sit to supine: Min assist   General bed mobility comments: minA to initiate rolling to R. modA for trunk elevation and bringing LEs towards EOB. MinA to return to supine with assist with R LE    Transfers Overall transfer level: Needs assistance Equipment used: 2 person hand held assist Transfers: Sit to/from [employer] Sit to Stand: Min assist;+2 physical assistance;+2 safety/equipment;Mod assist Stand pivot transfers: Min assist;+2 physical assistance;+2 safety/equipment;Mod assist       General transfer comment: minA+2 or modA+1 for sit to stand transfer and stand pivot. Able to slide R LE on floor to advance. Assist for weight shifting to L to offweight R LE  Ambulation/Gait             General Gait Details: deferred  Stairs            Wheelchair Mobility    Modified Rankin (Stroke Patients Only) Modified Rankin (Stroke Patients Only) Pre-Morbid Rankin Score: Slight disability Modified Rankin: Severe disability     Balance Overall balance assessment: Needs assistance Sitting-balance support: No upper extremity supported;Feet supported Sitting balance-Leahy Scale: Good     Standing balance support: Bilateral upper extremity supported;During functional activity Standing balance-Leahy Scale: Poor Standing balance comment: reliant on UE support and external assist                             Pertinent Vitals/Pain Pain Assessment: No/denies pain    Home Living Family/patient expects to be discharged to:: Inpatient rehab                 Additional Comments: lives alone at baseline    Prior Function Level of Independence: Independent with assistive device(s)         Comments: uses quad cane for ambulation, does not drive. Neice brings grocery     Hand Dominance        Extremity/Trunk Assessment   Upper Extremity Assessment Upper Extremity Assessment: Defer to OT evaluation  Lower Extremity Assessment Lower Extremity Assessment: RLE deficits/detail RLE Deficits / Details: Difficulty activate LE on command but functionally 2/5 RLE Sensation: decreased proprioception RLE Coordination: decreased fine motor;decreased gross motor    Cervical / Trunk Assessment Cervical / Trunk Assessment: Kyphotic  Communication   Communication:  Expressive difficulties  Cognition Arousal/Alertness: Awake/alert Behavior During Therapy: WFL for tasks assessed/performed Overall Cognitive Status: Difficult to assess Area of Impairment: Following commands                       Following Commands: Follows one step commands consistently;Follows one step commands with increased time       General Comments: following commands with increased time. Able to answer orientation questions with increased time and choices      General Comments General comments (skin integrity, edema, etc.): On 2L O2 [place] on arrival with spO2 maintaining >90% throughout    Exercises     Assessment/Plan    PT Assessment Patient needs continued PT services  PT Problem List Decreased strength;Decreased range of motion;Decreased activity tolerance;Decreased balance;Decreased mobility;Decreased coordination;Decreased cognition;Decreased knowledge of use of DME;Decreased safety awareness;Decreased knowledge of precautions;Impaired sensation       PT Treatment Interventions DME instruction;Gait training;Stair training;Functional mobility training;Therapeutic activities;Therapeutic exercise;Balance training;Neuromuscular re-education;Patient/family education;Wheelchair mobility training    PT Goals (Current goals can be found in the Care Plan section)  Acute Rehab PT Goals Patient Stated Goal: to get better PT Goal Formulation: With patient/family Time For Goal Achievement: 08/15/20 Potential to Achieve Goals: Good    Frequency Min 4X/week   Barriers to discharge        Co-evaluation PT/OT/SLP Co-Evaluation/Treatment: Yes Reason for Co-Treatment: For patient/therapist safety;To address functional/ADL transfers PT goals addressed during session: Mobility/safety with mobility;Balance         AM-PAC PT "6 Clicks" Mobility  Outcome Measure Help needed turning from your back to your side while in a flat bed without using bedrails?: A Little Help  needed moving from lying on your back to sitting on the side of a flat bed without using bedrails?: A Little Help needed moving to and from a bed to a chair (including a wheelchair)?: A Lot Help needed standing up from a chair using your arms (e.g., wheelchair or bedside chair)?: A Lot Help needed to walk in hospital room?: Total Help needed climbing 3-5 steps with a railing? : Total 6 Click Score: 12    End of Session Equipment Utilized During Treatment: Gait belt;Oxygen Activity Tolerance: Patient tolerated treatment well Patient left: in bed;with call bell/phone within reach (transport to MRI) Nurse Communication: Mobility status PT Visit Diagnosis: Unsteadiness on feet (R26.81);Muscle weakness (generalized) (M62.81);History of falling (Z91.81);Difficulty in walking, not elsewhere classified (R26.2);Other abnormalities of gait and mobility (R26.89);Hemiplegia and hemiparesis Hemiplegia - Right/Left: Right Hemiplegia - caused by: Cerebral infarction    Time: 6767-2094 PT Time Calculation (min) (ACUTE ONLY): 30 min   Charges:   PT Evaluation $PT Eval Moderate Complexity: 1 Mod PT Treatments $Therapeutic Activity: 8-22 mins        Nymir Ringler A. Dan Humphreys PT, DPT Acute Rehabilitation Services Pager [phone] Office [phone]   [name] 08/01/2020, 11:27 AM

## 2020-08-01 NOTE — Progress Notes (Signed)
PT Cancellation Note  Patient Details Name: Ellen Barajas MRN: [id number] DOB: 05/24/1928   Cancelled Treatment:    Reason Eval/Treat Not Completed: Active bedrest order Currently on bed rest per orders. Secure chat with MD, awaiting assessment by MD. PT will follow up when appropriate.  [name] A. Dan Humphreys PT, DPT Acute Rehabilitation Services Pager [phone] Office [phone]    [name] 08/01/2020, 9:15 AM

## 2020-08-01 NOTE — Progress Notes (Signed)
Progress Note  Patient Name: Ellen Barajas Date of Encounter: 08/01/2020  Primary Cardiologist: Kristeen Miss, MD   Subjective   Had MRI and echo- showed new stroke and new LVEF dysfunction.  Patient is minimally verbal but able to say no.  Able to shake her head yes and now.  Inpatient Medications    Scheduled Meds:  aspirin  325 mg Oral Daily   cephALEXin  500 mg Oral Q8H   enoxaparin (LOVENOX) injection  40 mg Subcutaneous Q24H   Continuous Infusions:  lactated ringers 100 mL/hr at 08/01/20 0627   PRN Meds:    Vital Signs    Vitals:   08/01/20 0600 08/01/20 0644 08/01/20 0811 08/01/20 1008  BP: (!) 133/112 (!) 151/90 (!) 157/78 111/79  Pulse: 91 83 88 87  Resp: (!) 24 20 12 20   Temp:  98.1 F (36.7 C) 98.4 F (36.9 C) 98.3 F (36.8 C)  TempSrc:  Oral Oral Oral  SpO2: 92% 91% 97% 99%  Weight:  68.4 kg    Height:  5\' 3"  (1.6 m)      Intake/Output Summary (Last 24 hours) at 08/01/2020 1432 Last data filed at 08/01/2020 0500 Gross per 24 hour  Intake 804.33 ml  Output --  Net 804.33 ml   Filed Weights   07/31/20 1244 08/01/20 0644  Weight: 71.7 kg 68.4 kg    Telemetry    AF - Personally Reviewed  ECG    None new since 07/31/20 - Personally Reviewed  Physical Exam   GEN: No acute distress.  Neck: No JVD Cardiac: irregular rhythm, rate controled,  no murmurs, rubs, or gallops.  Respiratory: Clear to auscultation bilaterally. GI: Soft, nontender, non-distended  MS: Non-pitting edema; No deformity. Neuro:  I/V R arm, leg, with facial droop Psych: Depressed affect   Labs    Chemistry Recent Labs  Lab 07/31/20 1304 07/31/20 1309 08/01/20 0015  NA 137 138 134*  K 4.5 4.4 4.2  CL 103  --  105  CO2 23  --  22  GLUCOSE 123*  --  145*  BUN 21  --  22  CREATININE 1.05*  --  0.88  CALCIUM 9.2  --  8.9  PROT 6.8  --  5.6*  ALBUMIN 3.9  --  3.2*  AST 50*  --  40  ALT 18  --  17  ALKPHOS 70  --  61  BILITOT 1.6*  --  1.5*  GFRNONAA 51*  --  >60   ANIONGAP 11  --  7     Hematology Recent Labs  Lab 07/31/20 1304 07/31/20 1309 08/01/20 0015  WBC 9.7  --  9.9  RBC 4.80  --  4.57  HGB 13.8 14.6 13.4  HCT 43.6 43.0 40.3  MCV 90.8  --  88.2  MCH 28.8  --  29.3  MCHC 31.7  --  33.3  RDW 14.2  --  14.2  PLT 243  --  207    Cardiac EnzymesNo results for input(s): TROPONINI in the last 168 hours. No results for input(s): TROPIPOC in the last 168 hours.   BNPNo results for input(s): BNP, PROBNP in the last 168 hours.   DDimer No results for input(s): DDIMER in the last 168 hours.   Radiology    CT Head Wo Contrast  Result Date: 07/31/2020 CLINICAL DATA:  Fall today with facial trauma. EXAM: CT HEAD WITHOUT CONTRAST CT MAXILLOFACIAL WITHOUT CONTRAST CT CERVICAL SPINE WITHOUT CONTRAST TECHNIQUE: Multidetector CT imaging  of the head, cervical spine, and maxillofacial structures were performed using the standard protocol without intravenous contrast. Multiplanar CT image reconstructions of the cervical spine and maxillofacial structures were also generated. COMPARISON:  None. FINDINGS: CT HEAD FINDINGS Brain: There is no evidence of acute intracranial hemorrhage, mass lesion, brain edema or extra-axial fluid collection. Mild-to-moderate atrophy with prominence of the ventricles and subarachnoid spaces. There are patchy small vessel ischemic changes in the periventricular white matter and basal ganglia bilaterally. There is no CT evidence of acute cortical infarction. Vascular: Intracranial vascular calcifications. No hyperdense vessel identified. Skull: Negative for fracture or focal lesion. Other: None. CT MAXILLOFACIAL FINDINGS Osseous: There are acute, mildly displaced fractures of the anterior and posterolateral walls of the right maxillary sinus with associated layering blood in the sinus. There is also a mildly comminuted and depressed fracture of the right zygomatic arch. Possible minimal diastasis at the right frontozygomatic suture.  No evidence of orbital or other facial fracture. The mandible and temporomandibular joints are intact. There are moderate TMJ degenerative changes bilaterally. Orbits: No evidence of orbital fracture. The globes are intact. Previous lens surgery and scleral banding bilaterally. No evidence of orbital hematoma. The extraocular muscles and optic nerves appear normal. Sinuses: Layering blood in the right maxillary sinus. The additional paranasal sinuses are clear without air-fluid levels. The mastoid air cells and middle ears are clear. Soft tissues: Soft tissue swelling over the right cheek and malar region without focal hematoma or foreign body. CT CERVICAL SPINE FINDINGS Alignment: Straightening with minimal retrolisthesis at C5-6 and anterolisthesis at C7-T1 and T1-2. Skull base and vertebrae: No evidence of acute fracture or traumatic subluxation. Soft tissues and spinal canal: Mild ossification of the ligamentum nuchae. No prevertebral fluid or swelling. No visible canal hematoma. Disc levels: Multilevel mild spondylosis for age, with disc space narrowing and uncinate spurring greatest at C5-6. Scattered facet degenerative changes. Upper chest: Bilateral carotid atherosclerosis. Other: None. IMPRESSION: 1. Mildly displaced fractures of the right zygomatic arch, anterior and posterolateral walls of the right maxillary sinus and possible mild diastasis of the right frontozygomatic suture (tripod fracture variant). No other facial fractures. 2. Overlying right facial soft tissue swelling without orbital hematoma or foreign body. 3. No acute intracranial or calvarial findings. Mild atrophy and chronic small vessel ischemic changes. 4. No evidence of acute cervical spine fracture, traumatic subluxation or static signs of instability. Multilevel cervical spondylosis. Electronically Signed   By: Carey Bullocks M.D.   On: 07/31/2020 14:22   CT Cervical Spine Wo Contrast  Result Date: 07/31/2020 CLINICAL DATA:  Fall  today with facial trauma. EXAM: CT HEAD WITHOUT CONTRAST CT MAXILLOFACIAL WITHOUT CONTRAST CT CERVICAL SPINE WITHOUT CONTRAST TECHNIQUE: Multidetector CT imaging of the head, cervical spine, and maxillofacial structures were performed using the standard protocol without intravenous contrast. Multiplanar CT image reconstructions of the cervical spine and maxillofacial structures were also generated. COMPARISON:  None. FINDINGS: CT HEAD FINDINGS Brain: There is no evidence of acute intracranial hemorrhage, mass lesion, brain edema or extra-axial fluid collection. Mild-to-moderate atrophy with prominence of the ventricles and subarachnoid spaces. There are patchy small vessel ischemic changes in the periventricular white matter and basal ganglia bilaterally. There is no CT evidence of acute cortical infarction. Vascular: Intracranial vascular calcifications. No hyperdense vessel identified. Skull: Negative for fracture or focal lesion. Other: None. CT MAXILLOFACIAL FINDINGS Osseous: There are acute, mildly displaced fractures of the anterior and posterolateral walls of the right maxillary sinus with associated layering blood in the sinus. There  is also a mildly comminuted and depressed fracture of the right zygomatic arch. Possible minimal diastasis at the right frontozygomatic suture. No evidence of orbital or other facial fracture. The mandible and temporomandibular joints are intact. There are moderate TMJ degenerative changes bilaterally. Orbits: No evidence of orbital fracture. The globes are intact. Previous lens surgery and scleral banding bilaterally. No evidence of orbital hematoma. The extraocular muscles and optic nerves appear normal. Sinuses: Layering blood in the right maxillary sinus. The additional paranasal sinuses are clear without air-fluid levels. The mastoid air cells and middle ears are clear. Soft tissues: Soft tissue swelling over the right cheek and malar region without focal hematoma or  foreign body. CT CERVICAL SPINE FINDINGS Alignment: Straightening with minimal retrolisthesis at C5-6 and anterolisthesis at C7-T1 and T1-2. Skull base and vertebrae: No evidence of acute fracture or traumatic subluxation. Soft tissues and spinal canal: Mild ossification of the ligamentum nuchae. No prevertebral fluid or swelling. No visible canal hematoma. Disc levels: Multilevel mild spondylosis for age, with disc space narrowing and uncinate spurring greatest at C5-6. Scattered facet degenerative changes. Upper chest: Bilateral carotid atherosclerosis. Other: None. IMPRESSION: 1. Mildly displaced fractures of the right zygomatic arch, anterior and posterolateral walls of the right maxillary sinus and possible mild diastasis of the right frontozygomatic suture (tripod fracture variant). No other facial fractures. 2. Overlying right facial soft tissue swelling without orbital hematoma or foreign body. 3. No acute intracranial or calvarial findings. Mild atrophy and chronic small vessel ischemic changes. 4. No evidence of acute cervical spine fracture, traumatic subluxation or static signs of instability. Multilevel cervical spondylosis. Electronically Signed   By: Carey Bullocks M.D.   On: 07/31/2020 14:22   MR BRAIN WO CONTRAST  Result Date: 08/01/2020 CLINICAL DATA:  Acute stroke suspected EXAM: MRI HEAD WITHOUT CONTRAST TECHNIQUE: Multiplanar, multiecho pulse sequences of the brain and surrounding structures were obtained without intravenous contrast. COMPARISON:  CT and CTA from earlier today FINDINGS: Brain: Moderate to large area of cortically based infarction in the high and parasagittal left frontal lobe, best aligned to the ACA distribution. A tiny cortically based infarct is seen at the cortex of the anterior left temporal lobe. Confluent chronic small vessel ischemia in the cerebral white matter and pons. No acute hemorrhage, hydrocephalus, or masslike finding. Generalized brain atrophy. Vascular:  Recent CTA.  No additional findings. Skull and upper cervical spine: Normal marrow signal Sinuses/Orbits: Right maxillary hemosinus, known. Bilateral cataract resection. IMPRESSION: 1. Moderate to large acute left ACA branch infarct. 2. Tiny acute infarct in the left anterior temporal cortex. 3. Atrophy and extensive chronic small vessel ischemia. Electronically Signed   By: Marnee Spring M.D.   On: 08/01/2020 12:00   DG Pelvis Portable  Result Date: 07/31/2020 CLINICAL DATA:  Status post fall. EXAM: PORTABLE PELVIS 1-2 VIEWS COMPARISON:  None. FINDINGS: There is no evidence of pelvic fracture or diastasis. Fixation hardware for healed bilateral intertrochanteric fractures noted. No pelvic bone lesions are seen. Lower lumbar spondylosis. IMPRESSION: No acute abnormality. Electronically Signed   By: Drusilla Kanner M.D.   On: 07/31/2020 15:12   CT CEREBRAL PERFUSION W CONTRAST  Result Date: 08/01/2020 CLINICAL DATA:  Stroke suspected EXAM: CT PERFUSION BRAIN TECHNIQUE: Multiphase CT imaging of the brain was performed following IV bolus contrast injection. Subsequent parametric perfusion maps were calculated using RAPID software. CONTRAST:  75mL OMNIPAQUE IOHEXOL 350 MG/ML SOLN COMPARISON:  Head CT and CTA from earlier today FINDINGS: CT Brain Perfusion Findings: CBF (<30%) Volume: 0mL  Perfusion (Tmax>6.0s) volume: 45mL IMPRESSION: No core infarct or cerebral ischemia by standard perfusion thresholds. Electronically Signed   By: Marnee Spring M.D.   On: 08/01/2020 05:13   DG Chest Port 1 View  Result Date: 07/31/2020 CLINICAL DATA:  Status post fall. EXAM: PORTABLE CHEST 1 VIEW COMPARISON:  Single-view of the chest 11/21/2018. FINDINGS: The patient is status post CABG. There is cardiomegaly. Aortic atherosclerosis. Lungs clear. No pneumothorax or pleural fluid. No acute or focal bony abnormality. IMPRESSION: No acute disease. Electronically Signed   By: Drusilla Kanner M.D.   On: 07/31/2020 15:13    ECHOCARDIOGRAM COMPLETE  Result Date: 08/01/2020    ECHOCARDIOGRAM REPORT   Patient Name:   Ellen Barajas Date of Exam: 08/01/2020 Medical Rec #:  [id number]    Height:       63.0 in Accession #:    [id number]   Weight:       150.8 lb Date of Birth:  1928-08-17     BSA:          1.715 m Patient Age:    87 years     BP:           151/90 mmHg Patient Gender: F            HR:           88 bpm. Exam Location:  Inpatient Procedure: 2D Echo, Cardiac Doppler and Color Doppler Indications:    NSTEMI I21.4  History:        Patient has prior history of Echocardiogram examinations, most                 recent 10/14/2019. CAD and Previous Myocardial Infarction, Prior                 CABG, Arrythmias:Atrial Fibrillation; Risk Factors:Dyslipidemia.  Sonographer:    Renella Cunas RDCS Referring Phys: [id number] ALLISON WOLFE IMPRESSIONS  1. Left ventricular ejection fraction, by estimation, is 25 to 30%. The left ventricle has severely decreased function. The left ventricle demonstrates regional wall motion abnormalities (see scoring diagram/findings for description). The left ventricular internal cavity size was mildly dilated. Left ventricular diastolic parameters are indeterminate.  2. Right ventricular systolic function is mildly reduced. The right ventricular size is normal. There is severely elevated pulmonary artery systolic pressure. The estimated right ventricular systolic pressure is 71.6 mmHg.  3. Left atrial size was mildly dilated.  4. The mitral valve is abnormal. Mild to moderate mitral valve regurgitation. The mean mitral valve gradient is 2.4 mmHg with average heart rate of 91 bpm. Severe mitral annular calcification.  5. Tricuspid valve regurgitation is mild to moderate.  6. The inferior vena cava is dilated in size with <50% respiratory variability, suggesting right atrial pressure of 15 mmHg.  7. The aortic valve is tricuspid. Aortic valve regurgitation is mild to moderate. Aortic regurgitation PHT measures 450 msec.  Aortic valve mean gradient measures 11.0 mmHg. Low Flow Low Gradient Aortic Stenosis suspected, in the setting of diminished stroke volume index and valve morphology. Comparison(s): A prior study was performed on 10/14/2019. Prior images reviewed side by side. Significant changes in LVEF have occurred. FINDINGS  Left Ventricle: Left ventricular ejection fraction, by estimation, is 25 to 30%. The left ventricle has severely decreased function. The left ventricle demonstrates regional wall motion abnormalities. The left ventricular internal cavity size was mildly  dilated. There is no left ventricular hypertrophy. Left ventricular diastolic parameters are indeterminate.  LV Wall Scoring: The anterior  wall, antero-lateral wall, and posterior wall are hypokinetic. Right Ventricle: The right ventricular size is normal. No increase in right ventricular wall thickness. Right ventricular systolic function is mildly reduced. There is severely elevated pulmonary artery systolic pressure. The tricuspid regurgitant velocity is 3.76 m/s, and with an assumed right atrial pressure of 15 mmHg, the estimated right ventricular systolic pressure is 71.6 mmHg. Left Atrium: Left atrial size was mildly dilated. Right Atrium: Right atrial size was normal in size. Pericardium: Trivial pericardial effusion is present. Mitral Valve: The mitral valve is abnormal. Severe mitral annular calcification. Mild to moderate mitral valve regurgitation. The mean mitral valve gradient is 2.4 mmHg with average heart rate of 91 bpm. Tricuspid Valve: The tricuspid valve is grossly normal. Tricuspid valve regurgitation is mild to moderate. Aortic Valve: The aortic valve is tricuspid. Aortic valve regurgitation is mild to moderate. Aortic regurgitation PHT measures 450 msec. Aortic valve mean gradient measures 11.0 mmHg. Aortic valve peak gradient measures 20.2 mmHg. Aortic valve area, by VTI measures 0.88 cm. Pulmonic Valve: The pulmonic valve was not well  visualized. Pulmonic valve regurgitation is mild. No evidence of pulmonic stenosis. Aorta: The aortic root is normal in size and structure. Venous: The inferior vena cava is dilated in size with less than 50% respiratory variability, suggesting right atrial pressure of 15 mmHg. IAS/Shunts: The atrial septum is grossly normal.  LEFT VENTRICLE PLAX 2D LVIDd:         5.70 cm LVIDs:         4.90 cm LV PW:         0.80 cm LV IVS:        0.80 cm LVOT diam:     2.00 cm LV SV:         41 LV SV Index:   24 LVOT Area:     3.14 cm  LV Volumes (MOD) LV vol d, MOD A2C: 128.0 ml LV vol d, MOD A4C: 123.0 ml LV vol s, MOD A2C: 95.6 ml LV vol s, MOD A4C: 85.6 ml LV SV MOD A2C:     32.4 ml LV SV MOD A4C:     123.0 ml LV SV MOD BP:      33.2 ml RIGHT VENTRICLE RV S prime:     6.08 cm/s TAPSE (M-mode): 1.1 cm LEFT ATRIUM             Index       RIGHT ATRIUM           Index LA diam:        3.90 cm 2.27 cm/m  RA Area:     16.50 cm LA Vol (A2C):   52.0 ml 30.32 ml/m RA Volume:   41.50 ml  24.20 ml/m LA Vol (A4C):   52.6 ml 30.67 ml/m LA Biplane Vol: 57.0 ml 33.24 ml/m  AORTIC VALVE AV Area (Vmax):    0.95 cm AV Area (Vmean):   0.89 cm AV Area (VTI):     0.88 cm AV Vmax:           225.00 cm/s AV Vmean:          151.500 cm/s AV VTI:            0.464 m AV Peak Grad:      20.2 mmHg AV Mean Grad:      11.0 mmHg LVOT Vmax:         67.80 cm/s LVOT Vmean:        42.900 cm/s LVOT VTI:  0.130 m LVOT/AV VTI ratio: 0.28 AI PHT:            450 msec  AORTA Ao Root diam: 3.30 cm Ao Asc diam:  3.30 cm MITRAL VALVE              TRICUSPID VALVE MV Mean grad: 2.4 mmHg    TR Peak grad:   56.6 mmHg MR Peak grad: 122.3 mmHg  TR Vmax:        376.00 cm/s MR Mean grad: 76.0 mmHg MR Vmax:      553.00 cm/s SHUNTS MR Vmean:     408.0 cm/s  Systemic VTI:  0.13 m                           Systemic Diam: 2.00 cm Riley Lam MD Electronically signed by Riley Lam MD Signature Date/Time: 08/01/2020/1:52:37 PM    Final    CT HEAD CODE  STROKE WO CONTRAST  Result Date: 08/01/2020 CLINICAL DATA:  Code stroke.  Left MCA syndrome EXAM: CT HEAD WITHOUT CONTRAST TECHNIQUE: Contiguous axial images were obtained from the base of the skull through the vertex without intravenous contrast. COMPARISON:  07/31/2020 FINDINGS: Brain: Acute infarct in the parasagittal left frontal lobe involving a moderate area, left ACA territory. No MCA territory infarct is seen. Chronic small vessel ischemia in the white matter. Cerebral volume loss with ventriculomegaly. No acute hemorrhage Vascular: No hyperdense vessel Skull: Negative for fracture Sinuses/Orbits: Right facial fractures as described on preceding face CT. Other: These results were called by telephone at the time of interpretation on 08/01/2020 at 4:49 am to provider [facility] , who verbally acknowledged these results. ASPECTS [facility] Stroke Program Early CT Score) - Ganglionic level infarction (caudate, lentiform nuclei, internal capsule, insula, M1-M3 cortex): 7 - Supraganglionic infarction (M4-M6 cortex): 3 Total score (0-10 with 10 being normal): 10 IMPRESSION: 1. Moderate acute infarct in the parasagittal left frontal lobe, ACA territory. 2. Aging brain. Electronically Signed   By: Marnee Spring M.D.   On: 08/01/2020 04:56   CT ANGIO HEAD CODE STROKE  Result Date: 08/01/2020 CLINICAL DATA:  Acute stroke EXAM: CT ANGIOGRAPHY HEAD AND NECK TECHNIQUE: Multidetector CT imaging of the head and neck was performed using the standard protocol during bolus administration of intravenous contrast. Multiplanar CT image reconstructions and MIPs were obtained to evaluate the vascular anatomy. Carotid stenosis measurements (when applicable) are obtained utilizing NASCET criteria, using the distal internal carotid diameter as the denominator. CONTRAST:  75mL OMNIPAQUE IOHEXOL 350 MG/ML SOLN COMPARISON:  None available FINDINGS: CTA NECK FINDINGS Aortic arch: Atheromatous plaque with 3 vessel branching.  Brachiocephalic artery origin is not covered. Right carotid system: Diffuse atheromatous wall thickening of the common carotid extending to the bifurcation. No flow limiting stenosis or ulceration. Left carotid system: Diffuse atheromatous plaque of mixed density involving the common carotid and extending into the proximal ICA. No flow limiting stenosis or ulceration Vertebral arteries: Proximal subclavian atherosclerosis on both sides without flow reducing stenosis. Although limited by motion, there is high-grade left V1 segment stenosis. Even worse stenosis in the left V3 segment with no flow by the dura and reconstitution serving the left PICA from the basilar. Dominant right vertebral artery is diffusely patent Skeleton: Recent dedicated cervical spine and facial CT. No interval findings. Other neck: No acute finding. Upper chest: Small pleural effusions. Vague nodular density in the left upper lobe with hazy margins, favoring a inflammatory process Review of the  MIP images confirms the above findings CTA HEAD FINDINGS Anterior circulation: Left MCA type symptoms. There is intermittent severe narrowing with flow gap involving the left M2 and M3 branches, but flow is seen beyond the major vessels there is actually even worse stenosis at the right M1 segment with accentuated lenticulostriate and downstream reconstitution. Bilateral ACA atherosclerosis with especially right A2 segment narrowing. 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 Posterior circulation: Dominant right vertebral artery shows diffuse atheromatous plaque with no flow limiting stenosis. Reconstituted distal left V4 segment serving the left PICA. The basilar is smooth and diffusely patent. Atheromatous irregularity of the bilateral PCA beyond the P2 segments. Venous sinuses: Unremarkable in the arterial phase Anatomic variants: None significant Review of the MIP images confirms the above findings IMPRESSION: 1. Negative for  large vessel occlusion. 2. Advanced intracranial atherosclerosis. Most notable stenoses are at the right M1 and left M2 segments. 3. 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 4. 2 mm A-comm aneurysm. Electronically Signed   By: Marnee Spring M.D.   On: 08/01/2020 05:05   CT ANGIO NECK CODE STROKE  Result Date: 08/01/2020 CLINICAL DATA:  Acute stroke EXAM: CT ANGIOGRAPHY HEAD AND NECK TECHNIQUE: Multidetector CT imaging of the head and neck was performed using the standard protocol during bolus administration of intravenous contrast. Multiplanar CT image reconstructions and MIPs were obtained to evaluate the vascular anatomy. Carotid stenosis measurements (when applicable) are obtained utilizing NASCET criteria, using the distal internal carotid diameter as the denominator. CONTRAST:  75mL OMNIPAQUE IOHEXOL 350 MG/ML SOLN COMPARISON:  None available FINDINGS: CTA NECK FINDINGS Aortic arch: Atheromatous plaque with 3 vessel branching. Brachiocephalic artery origin is not covered. Right carotid system: Diffuse atheromatous wall thickening of the common carotid extending to the bifurcation. No flow limiting stenosis or ulceration. Left carotid system: Diffuse atheromatous plaque of mixed density involving the common carotid and extending into the proximal ICA. No flow limiting stenosis or ulceration Vertebral arteries: Proximal subclavian atherosclerosis on both sides without flow reducing stenosis. Although limited by motion, there is high-grade left V1 segment stenosis. Even worse stenosis in the left V3 segment with no flow by the dura and reconstitution serving the left PICA from the basilar. Dominant right vertebral artery is diffusely patent Skeleton: Recent dedicated cervical spine and facial CT. No interval findings. Other neck: No acute finding. Upper chest: Small pleural effusions. Vague nodular density in  the left upper lobe with hazy margins, favoring a inflammatory process Review of the MIP images confirms the above findings CTA HEAD FINDINGS Anterior circulation: Left MCA type symptoms. There is intermittent severe narrowing with flow gap involving the left M2 and M3 branches, but flow is seen beyond the major vessels there is actually even worse stenosis at the right M1 segment with accentuated lenticulostriate and downstream reconstitution. Bilateral ACA atherosclerosis with especially right A2 segment narrowing. 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 Posterior circulation: Dominant right vertebral artery shows diffuse atheromatous plaque with no flow limiting stenosis. Reconstituted distal left V4 segment serving the left PICA. The basilar is smooth and diffusely patent. Atheromatous irregularity of the bilateral PCA beyond the P2 segments. Venous sinuses: Unremarkable in the arterial phase Anatomic variants: None significant Review of the MIP images confirms the above findings IMPRESSION: 1. Negative for large vessel occlusion. 2. Advanced intracranial atherosclerosis. Most notable stenoses are at the right M1 and  left M2 segments. 3. 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 4. 2 mm A-comm aneurysm. Electronically Signed   By: Marnee SpringJonathon  Watts M.D.   On: 08/01/2020 05:05   CT Maxillofacial WO CM  Result Date: 07/31/2020 CLINICAL DATA:  Fall today with facial trauma. EXAM: CT HEAD WITHOUT CONTRAST CT MAXILLOFACIAL WITHOUT CONTRAST CT CERVICAL SPINE WITHOUT CONTRAST TECHNIQUE: Multidetector CT imaging of the head, cervical spine, and maxillofacial structures were performed using the standard protocol without intravenous contrast. Multiplanar CT image reconstructions of the cervical spine and maxillofacial structures were also generated. COMPARISON:  None. FINDINGS: CT HEAD FINDINGS  Brain: There is no evidence of acute intracranial hemorrhage, mass lesion, brain edema or extra-axial fluid collection. Mild-to-moderate atrophy with prominence of the ventricles and subarachnoid spaces. There are patchy small vessel ischemic changes in the periventricular white matter and basal ganglia bilaterally. There is no CT evidence of acute cortical infarction. Vascular: Intracranial vascular calcifications. No hyperdense vessel identified. Skull: Negative for fracture or focal lesion. Other: None. CT MAXILLOFACIAL FINDINGS Osseous: There are acute, mildly displaced fractures of the anterior and posterolateral walls of the right maxillary sinus with associated layering blood in the sinus. There is also a mildly comminuted and depressed fracture of the right zygomatic arch. Possible minimal diastasis at the right frontozygomatic suture. No evidence of orbital or other facial fracture. The mandible and temporomandibular joints are intact. There are moderate TMJ degenerative changes bilaterally. Orbits: No evidence of orbital fracture. The globes are intact. Previous lens surgery and scleral banding bilaterally. No evidence of orbital hematoma. The extraocular muscles and optic nerves appear normal. Sinuses: Layering blood in the right maxillary sinus. The additional paranasal sinuses are clear without air-fluid levels. The mastoid air cells and middle ears are clear. Soft tissues: Soft tissue swelling over the right cheek and malar region without focal hematoma or foreign body. CT CERVICAL SPINE FINDINGS Alignment: Straightening with minimal retrolisthesis at C5-6 and anterolisthesis at C7-T1 and T1-2. Skull base and vertebrae: No evidence of acute fracture or traumatic subluxation. Soft tissues and spinal canal: Mild ossification of the ligamentum nuchae. No prevertebral fluid or swelling. No visible canal hematoma. Disc levels: Multilevel mild spondylosis for age, with disc space narrowing and uncinate  spurring greatest at C5-6. Scattered facet degenerative changes. Upper chest: Bilateral carotid atherosclerosis. Other: None. IMPRESSION: 1. Mildly displaced fractures of the right zygomatic arch, anterior and posterolateral walls of the right maxillary sinus and possible mild diastasis of the right frontozygomatic suture (tripod fracture variant). No other facial fractures. 2. Overlying right facial soft tissue swelling without orbital hematoma or foreign body. 3. No acute intracranial or calvarial findings. Mild atrophy and chronic small vessel ischemic changes. 4. No evidence of acute cervical spine fracture, traumatic subluxation or static signs of instability. Multilevel cervical spondylosis. Electronically Signed   By: Carey BullocksWilliam  Veazey M.D.   On: 07/31/2020 14:22    Cardiac Studies   Echo Date: 08/01/20  Patient Profile     87 y.o. female NSTEMI, new HF and Stroke  Assessment & Plan    CAD s/p CABG with NSTEMI New HFrEF Ischemic Cardiomyopathy highest on DDX New Stroke Permanent Atrial Fibrillation CHADSVASC 5 HTN and HLD - discussed at length with patient, family, Neurology, and primary MD: - will need to stop AC for at least 72 hours - transitioned to ASA 325 mg, and lovenox - will start plavix 08/02/20 - given permissive  HTN; will defer new GDMT at this time; will then try to add back losartan (and BB-> Coreg vs metoprolol) - lasix 20 mg PO Daily added  For questions or updates, please contact [facility] Please consult [email or web address] for contact info under Cardiology/STEMI.      Signed, Christell Constant, MD  08/01/2020, 2:32 PM

## 2020-08-01 NOTE — Progress Notes (Signed)
SLP Cancellation Note  Patient Details Name: Ellen Barajas MRN: [id number] DOB: 1928-09-19   Cancelled treatment:       Reason Eval/Treat Not Completed: Patient at procedure or test/unavailable (leaving for MRI)  Ferdinand Lango MA, CCC-SLP  Ellen Barajas 08/01/2020, 10:37 AM

## 2020-08-02 DIAGNOSIS — S0292XA Unspecified fracture of facial bones, initial encounter for closed fracture: Secondary | ICD-10-CM | POA: Diagnosis not present

## 2020-08-02 DIAGNOSIS — E785 Hyperlipidemia, unspecified: Secondary | ICD-10-CM | POA: Diagnosis not present

## 2020-08-02 DIAGNOSIS — I63422 Cerebral infarction due to embolism of left anterior cerebral artery: Secondary | ICD-10-CM | POA: Diagnosis not present

## 2020-08-02 DIAGNOSIS — I214 Non-ST elevation (NSTEMI) myocardial infarction: Secondary | ICD-10-CM | POA: Diagnosis not present

## 2020-08-02 LAB — CBC
HCT: 40.5 % (ref 36.0–46.0)
Hemoglobin: 13.2 g/dL (ref 12.0–15.0)
MCH: 29.5 pg (ref 26.0–34.0)
MCHC: 32.6 g/dL (ref 30.0–36.0)
MCV: 90.4 fL (ref 80.0–100.0)
Platelets: 200 10*3/uL (ref 150–400)
RBC: 4.48 MIL/uL (ref 3.87–5.11)
RDW: 14.3 % (ref 11.5–15.5)
WBC: 7.9 10*3/uL (ref 4.0–10.5)
nRBC: 0 % (ref 0.0–0.2)

## 2020-08-02 LAB — COMPREHENSIVE METABOLIC PANEL
ALT: 16 U/L (ref 0–44)
AST: 27 U/L (ref 15–41)
Albumin: 3.2 g/dL — ABNORMAL LOW (ref 3.5–5.0)
Alkaline Phosphatase: 56 U/L (ref 38–126)
Anion gap: 8 (ref 5–15)
BUN: 16 mg/dL (ref 8–23)
CO2: 25 mmol/L (ref 22–32)
Calcium: 8.9 mg/dL (ref 8.9–10.3)
Chloride: 104 mmol/L (ref 98–111)
Creatinine, Ser: 0.84 mg/dL (ref 0.44–1.00)
GFR, Estimated: >60 mL/min (ref >60)
Glucose, Bld: 95 mg/dL (ref 70–99)
Potassium: 3.9 mmol/L (ref 3.5–5.1)
Sodium: 137 mmol/L (ref 135–145)
Total Bilirubin: 1.3 mg/dL — ABNORMAL HIGH (ref 0.3–1.2)
Total Protein: 5.6 g/dL — ABNORMAL LOW (ref 6.5–8.1)

## 2020-08-02 LAB — LIPID PANEL
Cholesterol: 199 mg/dL (ref 0–200)
HDL: 53 mg/dL (ref >40)
LDL Cholesterol: 130 mg/dL — ABNORMAL HIGH (ref 0–99)
Total CHOL/HDL Ratio: 3.8 RATIO
Triglycerides: 79 mg/dL (ref <150)
VLDL: 16 mg/dL (ref 0–40)

## 2020-08-02 LAB — HEMOGLOBIN A1C
Hgb A1c MFr Bld: 6 % — ABNORMAL HIGH (ref 4.8–5.6)
Mean Plasma Glucose: 125.5 mg/dL

## 2020-08-02 MED ORDER — POLYETHYLENE GLYCOL 3350 17 G PO PACK: 17.0000 g | PACK | Freq: Every day | ORAL | Status: DC | PRN

## 2020-08-02 MED ORDER — ALBUTEROL SULFATE (2.5 MG/3ML) 0.083% IN NEBU: 2.5000 mg | INHALATION_SOLUTION | RESPIRATORY_TRACT | Status: DC | PRN

## 2020-08-02 MED ORDER — ASPIRIN EC 81 MG PO TBEC
81.0000 mg | DELAYED_RELEASE_TABLET | Freq: Every day | ORAL | Status: DC
  Administered 2020-08-03 – 2020-08-04 (×2): 81 mg via ORAL
  Filled 2020-08-02 (×2): qty 1

## 2020-08-02 MED ORDER — CLOPIDOGREL BISULFATE 300 MG PO TABS
300.0000 mg | ORAL_TABLET | Freq: Once | ORAL | Status: AC
  Administered 2020-08-02: 300 mg via ORAL
  Filled 2020-08-02: qty 1

## 2020-08-02 MED ORDER — LOSARTAN POTASSIUM 25 MG PO TABS
25.0000 mg | ORAL_TABLET | Freq: Every day | ORAL | Status: DC
  Administered 2020-08-02 – 2020-08-05 (×4): 25 mg via ORAL
  Filled 2020-08-02 (×4): qty 1

## 2020-08-02 MED ORDER — CLOPIDOGREL BISULFATE 75 MG PO TABS
75.0000 mg | ORAL_TABLET | Freq: Every day | ORAL | Status: DC
  Administered 2020-08-03 – 2020-08-05 (×3): 75 mg via ORAL
  Filled 2020-08-02 (×3): qty 1

## 2020-08-02 MED ORDER — BISACODYL 10 MG RE SUPP: 10.0000 mg | Freq: Every day | RECTAL | Status: DC | PRN

## 2020-08-02 MED ORDER — ONDANSETRON HCL 4 MG/2ML IJ SOLN: 4.0000 mg | Freq: Four times a day (QID) | INTRAMUSCULAR | Status: DC | PRN

## 2020-08-02 MED ORDER — METOPROLOL TARTRATE 12.5 MG HALF TABLET
12.5000 mg | ORAL_TABLET | Freq: Two times a day (BID) | ORAL | Status: DC
  Administered 2020-08-02 – 2020-08-03 (×4): 12.5 mg via ORAL
  Filled 2020-08-02 (×4): qty 1

## 2020-08-02 MED ORDER — ACETAMINOPHEN 325 MG PO TABS
650.0000 mg | ORAL_TABLET | Freq: Four times a day (QID) | ORAL | Status: DC | PRN
  Administered 2020-08-02 – 2020-08-03 (×2): 650 mg via ORAL
  Filled 2020-08-02 (×2): qty 2

## 2020-08-02 NOTE — Progress Notes (Signed)
Progress Note  Patient Name: Ellen Barajas Date of Encounter: 08/02/2020  Primary Cardiologist: Kristeen Miss, MD   Subjective   No events overnight.  BP has elevated.  Patient squeezes her left hand appropriately in response to questions.  Patient squeezed her hand in response to the statement: squeeze my hand if you are having chest pain).  Patient responds to questions with repeating the questions.  Inpatient Medications    Scheduled Meds:  aspirin  325 mg Oral Daily   atorvastatin  40 mg Oral Daily   cephALEXin  500 mg Oral Q8H   enoxaparin (LOVENOX) injection  40 mg Subcutaneous Q24H   furosemide  20 mg Oral Daily   Continuous Infusions:   PRN Meds:    Vital Signs    Vitals:   08/01/20 2000 08/02/20 0000 08/02/20 0400 08/02/20 0732  BP: 121/76 125/81 118/71 (!) 154/88  Pulse: 88 91 71 (!) 103  Resp: 17 18 16 14   Temp: 97.7 F (36.5 C) 98.2 F (36.8 C) 97.9 F (36.6 C) 98.2 F (36.8 C)  TempSrc: Temporal Temporal Temporal Oral  SpO2: 99% 97% 98% 99%  Weight:      Height:        Intake/Output Summary (Last 24 hours) at 08/02/2020 1019 Last data filed at 08/02/2020 0412 Gross per 24 hour  Intake 320 ml  Output 1450 ml  Net -1130 ml   Filed Weights   07/31/20 1244 08/01/20 0644  Weight: 71.7 kg 68.4 kg    Telemetry    AF with ashman beats - Personally Reviewed  ECG    None new since 07/31/20 - Personally Reviewed  Physical Exam   GEN: No acute distress.  Neck: No JVD Cardiac: irregular rhythm, rate controled,  systolic and diastolic murmurs appreciated Respiratory: Clear to auscultation bilaterally. GI: Soft, nontender, non-distended  MS: Non-pitting edema; No deformity. Neuro:  I/V R arm, leg, with facial droop Psych: Depressed affect   Labs    Chemistry Recent Labs  Lab 07/31/20 1304 07/31/20 1309 08/01/20 0015 08/02/20 0246  NA 137 138 134* 137  K 4.5 4.4 4.2 3.9  CL 103  --  105 104  CO2 23  --  22 25  GLUCOSE 123*  --  145*  95  BUN 21  --  22 16  CREATININE 1.05*  --  0.88 0.84  CALCIUM 9.2  --  8.9 8.9  PROT 6.8  --  5.6* 5.6*  ALBUMIN 3.9  --  3.2* 3.2*  AST 50*  --  40 27  ALT 18  --  17 16  ALKPHOS 70  --  61 56  BILITOT 1.6*  --  1.5* 1.3*  GFRNONAA 51*  --  >60 >60  ANIONGAP 11  --  7 8     Hematology Recent Labs  Lab 07/31/20 1304 07/31/20 1309 08/01/20 0015 08/02/20 0246  WBC 9.7  --  9.9 7.9  RBC 4.80  --  4.57 4.48  HGB 13.8 14.6 13.4 13.2  HCT 43.6 43.0 40.3 40.5  MCV 90.8  --  88.2 90.4  MCH 28.8  --  29.3 29.5  MCHC 31.7  --  33.3 32.6  RDW 14.2  --  14.2 14.3  PLT 243  --  207 200    Cardiac EnzymesNo results for input(s): TROPONINI in the last 168 hours. No results for input(s): TROPIPOC in the last 168 hours.   BNPNo results for input(s): BNP, PROBNP in the last 168 hours.  DDimer No results for input(s): DDIMER in the last 168 hours.   Radiology    CT Head Wo Contrast  Result Date: 07/31/2020 CLINICAL DATA:  Fall today with facial trauma. EXAM: CT HEAD WITHOUT CONTRAST CT MAXILLOFACIAL WITHOUT CONTRAST CT CERVICAL SPINE WITHOUT CONTRAST TECHNIQUE: Multidetector CT imaging of the head, cervical spine, and maxillofacial structures were performed using the standard protocol without intravenous contrast. Multiplanar CT image reconstructions of the cervical spine and maxillofacial structures were also generated. COMPARISON:  None. FINDINGS: CT HEAD FINDINGS Brain: There is no evidence of acute intracranial hemorrhage, mass lesion, brain edema or extra-axial fluid collection. Mild-to-moderate atrophy with prominence of the ventricles and subarachnoid spaces. There are patchy small vessel ischemic changes in the periventricular white matter and basal ganglia bilaterally. There is no CT evidence of acute cortical infarction. Vascular: Intracranial vascular calcifications. No hyperdense vessel identified. Skull: Negative for fracture or focal lesion. Other: None. CT MAXILLOFACIAL  FINDINGS Osseous: There are acute, mildly displaced fractures of the anterior and posterolateral walls of the right maxillary sinus with associated layering blood in the sinus. There is also a mildly comminuted and depressed fracture of the right zygomatic arch. Possible minimal diastasis at the right frontozygomatic suture. No evidence of orbital or other facial fracture. The mandible and temporomandibular joints are intact. There are moderate TMJ degenerative changes bilaterally. Orbits: No evidence of orbital fracture. The globes are intact. Previous lens surgery and scleral banding bilaterally. No evidence of orbital hematoma. The extraocular muscles and optic nerves appear normal. Sinuses: Layering blood in the right maxillary sinus. The additional paranasal sinuses are clear without air-fluid levels. The mastoid air cells and middle ears are clear. Soft tissues: Soft tissue swelling over the right cheek and malar region without focal hematoma or foreign body. CT CERVICAL SPINE FINDINGS Alignment: Straightening with minimal retrolisthesis at C5-6 and anterolisthesis at C7-T1 and T1-2. Skull base and vertebrae: No evidence of acute fracture or traumatic subluxation. Soft tissues and spinal canal: Mild ossification of the ligamentum nuchae. No prevertebral fluid or swelling. No visible canal hematoma. Disc levels: Multilevel mild spondylosis for age, with disc space narrowing and uncinate spurring greatest at C5-6. Scattered facet degenerative changes. Upper chest: Bilateral carotid atherosclerosis. Other: None. IMPRESSION: 1. Mildly displaced fractures of the right zygomatic arch, anterior and posterolateral walls of the right maxillary sinus and possible mild diastasis of the right frontozygomatic suture (tripod fracture variant). No other facial fractures. 2. Overlying right facial soft tissue swelling without orbital hematoma or foreign body. 3. No acute intracranial or calvarial findings. Mild atrophy and  chronic small vessel ischemic changes. 4. No evidence of acute cervical spine fracture, traumatic subluxation or static signs of instability. Multilevel cervical spondylosis. Electronically Signed   By: Carey Bullocks M.D.   On: 07/31/2020 14:22   CT Cervical Spine Wo Contrast  Result Date: 07/31/2020 CLINICAL DATA:  Fall today with facial trauma. EXAM: CT HEAD WITHOUT CONTRAST CT MAXILLOFACIAL WITHOUT CONTRAST CT CERVICAL SPINE WITHOUT CONTRAST TECHNIQUE: Multidetector CT imaging of the head, cervical spine, and maxillofacial structures were performed using the standard protocol without intravenous contrast. Multiplanar CT image reconstructions of the cervical spine and maxillofacial structures were also generated. COMPARISON:  None. FINDINGS: CT HEAD FINDINGS Brain: There is no evidence of acute intracranial hemorrhage, mass lesion, brain edema or extra-axial fluid collection. Mild-to-moderate atrophy with prominence of the ventricles and subarachnoid spaces. There are patchy small vessel ischemic changes in the periventricular white matter and basal ganglia bilaterally. There is no CT  evidence of acute cortical infarction. Vascular: Intracranial vascular calcifications. No hyperdense vessel identified. Skull: Negative for fracture or focal lesion. Other: None. CT MAXILLOFACIAL FINDINGS Osseous: There are acute, mildly displaced fractures of the anterior and posterolateral walls of the right maxillary sinus with associated layering blood in the sinus. There is also a mildly comminuted and depressed fracture of the right zygomatic arch. Possible minimal diastasis at the right frontozygomatic suture. No evidence of orbital or other facial fracture. The mandible and temporomandibular joints are intact. There are moderate TMJ degenerative changes bilaterally. Orbits: No evidence of orbital fracture. The globes are intact. Previous lens surgery and scleral banding bilaterally. No evidence of orbital hematoma. The  extraocular muscles and optic nerves appear normal. Sinuses: Layering blood in the right maxillary sinus. The additional paranasal sinuses are clear without air-fluid levels. The mastoid air cells and middle ears are clear. Soft tissues: Soft tissue swelling over the right cheek and malar region without focal hematoma or foreign body. CT CERVICAL SPINE FINDINGS Alignment: Straightening with minimal retrolisthesis at C5-6 and anterolisthesis at C7-T1 and T1-2. Skull base and vertebrae: No evidence of acute fracture or traumatic subluxation. Soft tissues and spinal canal: Mild ossification of the ligamentum nuchae. No prevertebral fluid or swelling. No visible canal hematoma. Disc levels: Multilevel mild spondylosis for age, with disc space narrowing and uncinate spurring greatest at C5-6. Scattered facet degenerative changes. Upper chest: Bilateral carotid atherosclerosis. Other: None. IMPRESSION: 1. Mildly displaced fractures of the right zygomatic arch, anterior and posterolateral walls of the right maxillary sinus and possible mild diastasis of the right frontozygomatic suture (tripod fracture variant). No other facial fractures. 2. Overlying right facial soft tissue swelling without orbital hematoma or foreign body. 3. No acute intracranial or calvarial findings. Mild atrophy and chronic small vessel ischemic changes. 4. No evidence of acute cervical spine fracture, traumatic subluxation or static signs of instability. Multilevel cervical spondylosis. Electronically Signed   By: Carey Bullocks M.D.   On: 07/31/2020 14:22   MR BRAIN WO CONTRAST  Result Date: 08/01/2020 CLINICAL DATA:  Acute stroke suspected EXAM: MRI HEAD WITHOUT CONTRAST TECHNIQUE: Multiplanar, multiecho pulse sequences of the brain and surrounding structures were obtained without intravenous contrast. COMPARISON:  CT and CTA from earlier today FINDINGS: Brain: Moderate to large area of cortically based infarction in the high and parasagittal  left frontal lobe, best aligned to the ACA distribution. A tiny cortically based infarct is seen at the cortex of the anterior left temporal lobe. Confluent chronic small vessel ischemia in the cerebral white matter and pons. No acute hemorrhage, hydrocephalus, or masslike finding. Generalized brain atrophy. Vascular: Recent CTA.  No additional findings. Skull and upper cervical spine: Normal marrow signal Sinuses/Orbits: Right maxillary hemosinus, known. Bilateral cataract resection. IMPRESSION: 1. Moderate to large acute left ACA branch infarct. 2. Tiny acute infarct in the left anterior temporal cortex. 3. Atrophy and extensive chronic small vessel ischemia. Electronically Signed   By: Marnee Spring M.D.   On: 08/01/2020 12:00   DG Pelvis Portable  Result Date: 07/31/2020 CLINICAL DATA:  Status post fall. EXAM: PORTABLE PELVIS 1-2 VIEWS COMPARISON:  None. FINDINGS: There is no evidence of pelvic fracture or diastasis. Fixation hardware for healed bilateral intertrochanteric fractures noted. No pelvic bone lesions are seen. Lower lumbar spondylosis. IMPRESSION: No acute abnormality. Electronically Signed   By: Drusilla Kanner M.D.   On: 07/31/2020 15:12   CT CEREBRAL PERFUSION W CONTRAST  Result Date: 08/01/2020 CLINICAL DATA:  Stroke suspected EXAM: CT  PERFUSION BRAIN TECHNIQUE: Multiphase CT imaging of the brain was performed following IV bolus contrast injection. Subsequent parametric perfusion maps were calculated using RAPID software. CONTRAST:  75mL OMNIPAQUE IOHEXOL 350 MG/ML SOLN COMPARISON:  Head CT and CTA from earlier today FINDINGS: CT Brain Perfusion Findings: CBF (<30%) Volume: 0mL Perfusion (Tmax>6.0s) volume: 0mL IMPRESSION: No core infarct or cerebral ischemia by standard perfusion thresholds. Electronically Signed   By: Marnee SpringJonathon  Watts M.D.   On: 08/01/2020 05:13   DG Chest Port 1 View  Result Date: 07/31/2020 CLINICAL DATA:  Status post fall. EXAM: PORTABLE CHEST 1 VIEW COMPARISON:   Single-view of the chest 11/21/2018. FINDINGS: The patient is status post CABG. There is cardiomegaly. Aortic atherosclerosis. Lungs clear. No pneumothorax or pleural fluid. No acute or focal bony abnormality. IMPRESSION: No acute disease. Electronically Signed   By: Drusilla Kannerhomas  Dalessio M.D.   On: 07/31/2020 15:13   ECHOCARDIOGRAM COMPLETE  Result Date: 08/01/2020    ECHOCARDIOGRAM REPORT   Patient Name:   Otis DialsRMA D Claggett Date of Exam: 08/01/2020 Medical Rec #:  [id number]    Height:       63.0 in Accession #:    [id number]   Weight:       150.8 lb Date of Birth:  07/25/1928     BSA:          1.715 m Patient Age:    87 years     BP:           151/90 mmHg Patient Gender: F            HR:           88 bpm. Exam Location:  Inpatient Procedure: 2D Echo, Cardiac Doppler and Color Doppler Indications:    NSTEMI I21.4  History:        Patient has prior history of Echocardiogram examinations, most                 recent 10/14/2019. CAD and Previous Myocardial Infarction, Prior                 CABG, Arrythmias:Atrial Fibrillation; Risk Factors:Dyslipidemia.  Sonographer:    Renella CunasJulia Swaim RDCS Referring Phys: [id number] ALLISON WOLFE IMPRESSIONS  1. Left ventricular ejection fraction, by estimation, is 25 to 30%. The left ventricle has severely decreased function. The left ventricle demonstrates regional wall motion abnormalities (see scoring diagram/findings for description). The left ventricular internal cavity size was mildly dilated. Left ventricular diastolic parameters are indeterminate.  2. Right ventricular systolic function is mildly reduced. The right ventricular size is normal. There is severely elevated pulmonary artery systolic pressure. The estimated right ventricular systolic pressure is 71.6 mmHg.  3. Left atrial size was mildly dilated.  4. The mitral valve is abnormal. Mild to moderate mitral valve regurgitation. The mean mitral valve gradient is 2.4 mmHg with average heart rate of 91 bpm. Severe mitral annular  calcification.  5. Tricuspid valve regurgitation is mild to moderate.  6. The inferior vena cava is dilated in size with <50% respiratory variability, suggesting right atrial pressure of 15 mmHg.  7. The aortic valve is tricuspid. Aortic valve regurgitation is mild to moderate. Aortic regurgitation PHT measures 450 msec. Aortic valve mean gradient measures 11.0 mmHg. Low Flow Low Gradient Aortic Stenosis suspected, in the setting of diminished stroke volume index and valve morphology. Comparison(s): A prior study was performed on 10/14/2019. Prior images reviewed side by side. Significant changes in LVEF have occurred. FINDINGS  Left Ventricle: Left ventricular  ejection fraction, by estimation, is 25 to 30%. The left ventricle has severely decreased function. The left ventricle demonstrates regional wall motion abnormalities. The left ventricular internal cavity size was mildly  dilated. There is no left ventricular hypertrophy. Left ventricular diastolic parameters are indeterminate.  LV Wall Scoring: The anterior wall, antero-lateral wall, and posterior wall are hypokinetic. Right Ventricle: The right ventricular size is normal. No increase in right ventricular wall thickness. Right ventricular systolic function is mildly reduced. There is severely elevated pulmonary artery systolic pressure. The tricuspid regurgitant velocity is 3.76 m/s, and with an assumed right atrial pressure of 15 mmHg, the estimated right ventricular systolic pressure is 71.6 mmHg. Left Atrium: Left atrial size was mildly dilated. Right Atrium: Right atrial size was normal in size. Pericardium: Trivial pericardial effusion is present. Mitral Valve: The mitral valve is abnormal. Severe mitral annular calcification. Mild to moderate mitral valve regurgitation. The mean mitral valve gradient is 2.4 mmHg with average heart rate of 91 bpm. Tricuspid Valve: The tricuspid valve is grossly normal. Tricuspid valve regurgitation is mild to moderate.  Aortic Valve: The aortic valve is tricuspid. Aortic valve regurgitation is mild to moderate. Aortic regurgitation PHT measures 450 msec. Aortic valve mean gradient measures 11.0 mmHg. Aortic valve peak gradient measures 20.2 mmHg. Aortic valve area, by VTI measures 0.88 cm. Pulmonic Valve: The pulmonic valve was not well visualized. Pulmonic valve regurgitation is mild. No evidence of pulmonic stenosis. Aorta: The aortic root is normal in size and structure. Venous: The inferior vena cava is dilated in size with less than 50% respiratory variability, suggesting right atrial pressure of 15 mmHg. IAS/Shunts: The atrial septum is grossly normal.  LEFT VENTRICLE PLAX 2D LVIDd:         5.70 cm LVIDs:         4.90 cm LV PW:         0.80 cm LV IVS:        0.80 cm LVOT diam:     2.00 cm LV SV:         41 LV SV Index:   24 LVOT Area:     3.14 cm  LV Volumes (MOD) LV vol d, MOD A2C: 128.0 ml LV vol d, MOD A4C: 123.0 ml LV vol s, MOD A2C: 95.6 ml LV vol s, MOD A4C: 85.6 ml LV SV MOD A2C:     32.4 ml LV SV MOD A4C:     123.0 ml LV SV MOD BP:      33.2 ml RIGHT VENTRICLE RV S prime:     6.08 cm/s TAPSE (M-mode): 1.1 cm LEFT ATRIUM             Index       RIGHT ATRIUM           Index LA diam:        3.90 cm 2.27 cm/m  RA Area:     16.50 cm LA Vol (A2C):   52.0 ml 30.32 ml/m RA Volume:   41.50 ml  24.20 ml/m LA Vol (A4C):   52.6 ml 30.67 ml/m LA Biplane Vol: 57.0 ml 33.24 ml/m  AORTIC VALVE AV Area (Vmax):    0.95 cm AV Area (Vmean):   0.89 cm AV Area (VTI):     0.88 cm AV Vmax:           225.00 cm/s AV Vmean:          151.500 cm/s AV VTI:  0.464 m AV Peak Grad:      20.2 mmHg AV Mean Grad:      11.0 mmHg LVOT Vmax:         67.80 cm/s LVOT Vmean:        42.900 cm/s LVOT VTI:          0.130 m LVOT/AV VTI ratio: 0.28 AI PHT:            450 msec  AORTA Ao Root diam: 3.30 cm Ao Asc diam:  3.30 cm MITRAL VALVE              TRICUSPID VALVE MV Mean grad: 2.4 mmHg    TR Peak grad:   56.6 mmHg MR Peak grad: 122.3 mmHg   TR Vmax:        376.00 cm/s MR Mean grad: 76.0 mmHg MR Vmax:      553.00 cm/s SHUNTS MR Vmean:     408.0 cm/s  Systemic VTI:  0.13 m                           Systemic Diam: 2.00 cm Riley Lam MD Electronically signed by Riley Lam MD Signature Date/Time: 08/01/2020/1:52:37 PM    Final    CT HEAD CODE STROKE WO CONTRAST  Result Date: 08/01/2020 CLINICAL DATA:  Code stroke.  Left MCA syndrome EXAM: CT HEAD WITHOUT CONTRAST TECHNIQUE: Contiguous axial images were obtained from the base of the skull through the vertex without intravenous contrast. COMPARISON:  07/31/2020 FINDINGS: Brain: Acute infarct in the parasagittal left frontal lobe involving a moderate area, left ACA territory. No MCA territory infarct is seen. Chronic small vessel ischemia in the white matter. Cerebral volume loss with ventriculomegaly. No acute hemorrhage Vascular: No hyperdense vessel Skull: Negative for fracture Sinuses/Orbits: Right facial fractures as described on preceding face CT. Other: These results were called by telephone at the time of interpretation on 08/01/2020 at 4:49 am to provider [facility] , who verbally acknowledged these results. ASPECTS [facility] Stroke Program Early CT Score) - Ganglionic level infarction (caudate, lentiform nuclei, internal capsule, insula, M1-M3 cortex): 7 - Supraganglionic infarction (M4-M6 cortex): 3 Total score (0-10 with 10 being normal): 10 IMPRESSION: 1. Moderate acute infarct in the parasagittal left frontal lobe, ACA territory. 2. Aging brain. Electronically Signed   By: Marnee Spring M.D.   On: 08/01/2020 04:56   CT ANGIO HEAD CODE STROKE  Result Date: 08/01/2020 CLINICAL DATA:  Acute stroke EXAM: CT ANGIOGRAPHY HEAD AND NECK TECHNIQUE: Multidetector CT imaging of the head and neck was performed using the standard protocol during bolus administration of intravenous contrast. Multiplanar CT image reconstructions and MIPs were obtained to evaluate the vascular  anatomy. Carotid stenosis measurements (when applicable) are obtained utilizing NASCET criteria, using the distal internal carotid diameter as the denominator. CONTRAST:  24mL OMNIPAQUE IOHEXOL 350 MG/ML SOLN COMPARISON:  None available FINDINGS: CTA NECK FINDINGS Aortic arch: Atheromatous plaque with 3 vessel branching. Brachiocephalic artery origin is not covered. Right carotid system: Diffuse atheromatous wall thickening of the common carotid extending to the bifurcation. No flow limiting stenosis or ulceration. Left carotid system: Diffuse atheromatous plaque of mixed density involving the common carotid and extending into the proximal ICA. No flow limiting stenosis or ulceration Vertebral arteries: Proximal subclavian atherosclerosis on both sides without flow reducing stenosis. Although limited by motion, there is high-grade left V1 segment stenosis. Even worse stenosis in the left V3 segment with no flow by the  dura and reconstitution serving the left PICA from the basilar. Dominant right vertebral artery is diffusely patent Skeleton: Recent dedicated cervical spine and facial CT. No interval findings. Other neck: No acute finding. Upper chest: Small pleural effusions. Vague nodular density in the left upper lobe with hazy margins, favoring a inflammatory process Review of the MIP images confirms the above findings CTA HEAD FINDINGS Anterior circulation: Left MCA type symptoms. There is intermittent severe narrowing with flow gap involving the left M2 and M3 branches, but flow is seen beyond the major vessels there is actually even worse stenosis at the right M1 segment with accentuated lenticulostriate and downstream reconstitution. Bilateral ACA atherosclerosis with especially right A2 segment narrowing. 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 Posterior circulation: Dominant right vertebral artery shows diffuse atheromatous plaque with no flow limiting stenosis. Reconstituted distal  left V4 segment serving the left PICA. The basilar is smooth and diffusely patent. Atheromatous irregularity of the bilateral PCA beyond the P2 segments. Venous sinuses: Unremarkable in the arterial phase Anatomic variants: None significant Review of the MIP images confirms the above findings IMPRESSION: 1. Negative for large vessel occlusion. 2. Advanced intracranial atherosclerosis. Most notable stenoses are at the right M1 and left M2 segments. 3. 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 4. 2 mm A-comm aneurysm. Electronically Signed   By: Marnee Spring M.D.   On: 08/01/2020 05:05   CT ANGIO NECK CODE STROKE  Result Date: 08/01/2020 CLINICAL DATA:  Acute stroke EXAM: CT ANGIOGRAPHY HEAD AND NECK TECHNIQUE: Multidetector CT imaging of the head and neck was performed using the standard protocol during bolus administration of intravenous contrast. Multiplanar CT image reconstructions and MIPs were obtained to evaluate the vascular anatomy. Carotid stenosis measurements (when applicable) are obtained utilizing NASCET criteria, using the distal internal carotid diameter as the denominator. CONTRAST:  75mL OMNIPAQUE IOHEXOL 350 MG/ML SOLN COMPARISON:  None available FINDINGS: CTA NECK FINDINGS Aortic arch: Atheromatous plaque with 3 vessel branching. Brachiocephalic artery origin is not covered. Right carotid system: Diffuse atheromatous wall thickening of the common carotid extending to the bifurcation. No flow limiting stenosis or ulceration. Left carotid system: Diffuse atheromatous plaque of mixed density involving the common carotid and extending into the proximal ICA. No flow limiting stenosis or ulceration Vertebral arteries: Proximal subclavian atherosclerosis on both sides without flow reducing stenosis. Although limited by motion, there is high-grade left V1 segment stenosis. Even worse stenosis in the  left V3 segment with no flow by the dura and reconstitution serving the left PICA from the basilar. Dominant right vertebral artery is diffusely patent Skeleton: Recent dedicated cervical spine and facial CT. No interval findings. Other neck: No acute finding. Upper chest: Small pleural effusions. Vague nodular density in the left upper lobe with hazy margins, favoring a inflammatory process Review of the MIP images confirms the above findings CTA HEAD FINDINGS Anterior circulation: Left MCA type symptoms. There is intermittent severe narrowing with flow gap involving the left M2 and M3 branches, but flow is seen beyond the major vessels there is actually even worse stenosis at the right M1 segment with accentuated lenticulostriate and downstream reconstitution. Bilateral ACA atherosclerosis with especially right A2 segment narrowing. 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 Posterior circulation: Dominant right vertebral artery shows diffuse atheromatous plaque with no flow limiting stenosis. Reconstituted distal left V4 segment serving the left PICA. The  basilar is smooth and diffusely patent. Atheromatous irregularity of the bilateral PCA beyond the P2 segments. Venous sinuses: Unremarkable in the arterial phase Anatomic variants: None significant Review of the MIP images confirms the above findings IMPRESSION: 1. Negative for large vessel occlusion. 2. Advanced intracranial atherosclerosis. Most notable stenoses are at the right M1 and left M2 segments. 3. 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 4. 2 mm A-comm aneurysm. Electronically Signed   By: Marnee Spring M.D.   On: 08/01/2020 05:05   CT Maxillofacial WO CM  Result Date: 07/31/2020 CLINICAL DATA:  Fall today with facial trauma. EXAM: CT HEAD WITHOUT CONTRAST CT MAXILLOFACIAL WITHOUT CONTRAST CT CERVICAL SPINE WITHOUT CONTRAST  TECHNIQUE: Multidetector CT imaging of the head, cervical spine, and maxillofacial structures were performed using the standard protocol without intravenous contrast. Multiplanar CT image reconstructions of the cervical spine and maxillofacial structures were also generated. COMPARISON:  None. FINDINGS: CT HEAD FINDINGS Brain: There is no evidence of acute intracranial hemorrhage, mass lesion, brain edema or extra-axial fluid collection. Mild-to-moderate atrophy with prominence of the ventricles and subarachnoid spaces. There are patchy small vessel ischemic changes in the periventricular white matter and basal ganglia bilaterally. There is no CT evidence of acute cortical infarction. Vascular: Intracranial vascular calcifications. No hyperdense vessel identified. Skull: Negative for fracture or focal lesion. Other: None. CT MAXILLOFACIAL FINDINGS Osseous: There are acute, mildly displaced fractures of the anterior and posterolateral walls of the right maxillary sinus with associated layering blood in the sinus. There is also a mildly comminuted and depressed fracture of the right zygomatic arch. Possible minimal diastasis at the right frontozygomatic suture. No evidence of orbital or other facial fracture. The mandible and temporomandibular joints are intact. There are moderate TMJ degenerative changes bilaterally. Orbits: No evidence of orbital fracture. The globes are intact. Previous lens surgery and scleral banding bilaterally. No evidence of orbital hematoma. The extraocular muscles and optic nerves appear normal. Sinuses: Layering blood in the right maxillary sinus. The additional paranasal sinuses are clear without air-fluid levels. The mastoid air cells and middle ears are clear. Soft tissues: Soft tissue swelling over the right cheek and malar region without focal hematoma or foreign body. CT CERVICAL SPINE FINDINGS Alignment: Straightening with minimal retrolisthesis at C5-6 and anterolisthesis at C7-T1  and T1-2. Skull base and vertebrae: No evidence of acute fracture or traumatic subluxation. Soft tissues and spinal canal: Mild ossification of the ligamentum nuchae. No prevertebral fluid or swelling. No visible canal hematoma. Disc levels: Multilevel mild spondylosis for age, with disc space narrowing and uncinate spurring greatest at C5-6. Scattered facet degenerative changes. Upper chest: Bilateral carotid atherosclerosis. Other: None. IMPRESSION: 1. Mildly displaced fractures of the right zygomatic arch, anterior and posterolateral walls of the right maxillary sinus and possible mild diastasis of the right frontozygomatic suture (tripod fracture variant). No other facial fractures. 2. Overlying right facial soft tissue swelling without orbital hematoma or foreign body. 3. No acute intracranial or calvarial findings. Mild atrophy and chronic small vessel ischemic changes. 4. No evidence of acute cervical spine fracture, traumatic subluxation or static signs of instability. Multilevel cervical spondylosis. Electronically Signed   By: Carey Bullocks M.D.   On: 07/31/2020 14:22     Patient Profile     87 y.o. female NSTEMI, new HF and Stroke after fall with prolonged down time  Assessment & Plan    CAD s/p CABG with NSTEMI  New HFrEF Ischemic Cardiomyopathy highest on DDX New Stroke Permanent Atrial Fibrillation CHADSVASC 5 HTN and HLD Low Flow Low gradient AS - will need to stop AC for at least 48 more hours - transitioned to ASA 81 mg, and lovenox [place] - plavix load and maintenance started today - starting losartan 25 mg PO Daily today - lasix 20 mg PO Daily added 08/01/20 - unless new symptoms, planned for medical therapy at this time  - will also add low dose BB (metoprolol tartrate in the setting of CP; may tolerate PRN nitro but held off addition at this time in the setting of AS  Discussed with neurology, primary MD, pharmacy and patient's family   For questions or updates, please  contact [facility] Please consult [email or web address] for contact info under Cardiology/STEMI.      Signed, Christell Constant, MD  08/02/2020, 10:19 AM

## 2020-08-02 NOTE — Progress Notes (Signed)
PROGRESS NOTE        PATIENT DETAILS Name: Ellen Barajas Age: 87 y.o. Sex: female Date of Birth: Oct 28, 1928 Admit Date: 07/31/2020 Admitting Physician Orland Mustard, MD PCP:No primary care provider on file.  Brief Narrative: Patient is a 87 y.o. female with history of A. fib on Xarelto, CAD s/p CABG, HTN, HLD-who was found at her house by her niece-confused-s/p fall-she was subsequently brought to the ED-where she was found to have non-STEMI, and acute CVA.  See below for further details  Significant events: 7/8>> found by niece-fall-confused-found to have non-STEMI/acute CVA.  Significant studies: 7/8>> CT head: No acute intracranial findings 7/8>> CT C-spine: No fracture/subluxation 7/8>> CT maxillofacial area: Mildly displaced fracture of right zygomatic arch, anterior/posterior lateral walls of the right maxillary sinus 7/8>> CXR: No pneumonia 7/9>> CTA head: Advanced intracranial stenosis-most notable in right M1 and left M2 segments, 2 mm A-comm aneurysm 7/9>> CTA neck: No flow limiting stenosis or ulceration in carotid arteries, occluded left vertebral artery 7/9>> MRI brain: Moderate to large acute left ACA infarct 7/9>> TTE: EF 25-30%, RVSP 71.6.  Left ventricle-anterior, anterolateral and posterior wall are hypokinetic 7/9>>LDL:140 7/10>> A1c: 6.0  Antimicrobial therapy: None  Microbiology data: 7/8>> COVID/influenza PCR: Negative  Procedures : None  Consults: Neurology, cardiology  DVT Prophylaxis : enoxaparin (LOVENOX) injection 40 mg Start: 08/01/20 2000Prophylactic Lovenox   Subjective: Continues to have expressive aphasia-but when she speaks-her words are more clear today.   Assessment/Plan: Non-STEMI: With echo evidence of new onset systolic heart failure and wall motion abnormality-given that she has significant neurological deficits-at risk for hemorrhagic conversion-agree with conservative management.  Niece at bedside does  not desire procedures unless it is absolutely necessary.  Currently on aspirin/Plavix/statin.  Acute CVA: Suspicion for embolic CVA in the setting of non-STEMI and new LV dysfunction.  Anticoagulation on hold-stable on aspirin.  Await rehab services follow-up.  Continues to have significant expressive aphasia-and right-sided hemiparesis.   Newly diagnosed systolic heart failure: Suspect ischemic cardiomyopathy in the setting of non-STEMI.  Volume status is stable-cardiology following-on low-dose Lasix and losartan.  Beta-blocker will be added slowly.  Persistent atrial fibrillation: Rate controlled-discussed with neurology-hold Xarelto for a few days given size of CVA.  On aspirin/Plavix-Per neurology-plan is to restart Xarelto when risk of hemorrhagic transformation is low.  History of CAD s/p CABG  HTN: BP relatively stable-on losartan-allow some permissive hypertension.  Multiple facial fractures: Admitting MD spoke with ENT-recommendations were for supportive care-empiric Keflex to prevent sinusitis (blood in maxillary sinus with pulling).  Mildly elevated CK: Probably either mild rhabdomyolysis or a sequelae of non-STEMI.  Palliative care: DNR in place-discussed with niece at bedside-no heroics-okay with gentle medical treatment  Diet: Diet Order             Diet regular Room service appropriate? Yes; Fluid consistency: Thin  Diet effective now                    Code Status: Full code   Family Communication: Ellen Barajas-[phone]-at bedside  Disposition Plan: Status is: Inpatient  Remains inpatient appropriate because:Inpatient level of care appropriate due to severity of illness  Dispo: The patient is from: Home              Anticipated d/c is to: CIR  Patient currently is not medically stable to d/c.   Difficult to place patient No    Barriers to Discharge: Acute CVA-dysarthria-right-sided deficits-dysphagia-non-STEMI-work-up in  progress-not stable for discharge.  Antimicrobial agents: Anti-infectives (From admission, onward)    Start     Dose/Rate Route Frequency Ordered Stop   07/31/20 1745  cephALEXin (KEFLEX) capsule 500 mg        500 mg Oral Every 8 hours 07/31/20 1736 08/07/20 1359        Time spent: 35 minutes-Greater than 50% of this time was spent in counseling, explanation of diagnosis, planning of further management, and coordination of care.  MEDICATIONS: Scheduled Meds:  aspirin  325 mg Oral Daily   atorvastatin  40 mg Oral Daily   cephALEXin  500 mg Oral Q8H   clopidogrel  300 mg Oral Once   [START ON 08/03/2020] clopidogrel  75 mg Oral Daily   enoxaparin (LOVENOX) injection  40 mg Subcutaneous Q24H   furosemide  20 mg Oral Daily   losartan  25 mg Oral Daily   Continuous Infusions:   PRN Meds:.   PHYSICAL EXAM: Vital signs: Vitals:   08/01/20 2000 08/02/20 0000 08/02/20 0400 08/02/20 0732  BP: 121/76 125/81 118/71 (!) 154/88  Pulse: 88 91 71 (!) 103  Resp: 17 18 16 14   Temp: 97.7 F (36.5 C) 98.2 F (36.8 C) 97.9 F (36.6 C) 98.2 F (36.8 C)  TempSrc: Temporal Temporal Temporal Oral  SpO2: 99% 97% 98% 99%  Weight:      Height:       Filed Weights   07/31/20 1244 08/01/20 0644  Weight: 71.7 kg 68.4 kg   Body mass index is 26.71 kg/m.   Gen Exam:Alert awake-not in any distress HEENT:atraumatic, normocephalic Chest: B/L clear to auscultation anteriorly CVS:S1S2 regular Abdomen:soft non tender, non distended Extremities:no edema Neurology: Expressive aphasia-continues to have right-sided hemiparesis-unchanged from yesterday. Skin: no rash   I have personally reviewed following labs and imaging studies  LABORATORY DATA: CBC: Recent Labs  Lab 07/31/20 1304 07/31/20 1309 08/01/20 0015 08/02/20 0246  WBC 9.7  --  9.9 7.9  NEUTROABS 8.4*  --   --   --   HGB 13.8 14.6 13.4 13.2  HCT 43.6 43.0 40.3 40.5  MCV 90.8  --  88.2 90.4  PLT 243  --  207 200      Basic Metabolic Panel: Recent Labs  Lab 07/31/20 1304 07/31/20 1309 08/01/20 0015 08/02/20 0246  NA 137 138 134* 137  K 4.5 4.4 4.2 3.9  CL 103  --  105 104  CO2 23  --  22 25  GLUCOSE 123*  --  145* 95  BUN 21  --  22 16  CREATININE 1.05*  --  0.88 0.84  CALCIUM 9.2  --  8.9 8.9  MG 2.2  --   --   --   PHOS 4.5  --   --   --      GFR: Estimated Creatinine Clearance: 42.1 mL/min (by C-G formula based on SCr of 0.84 mg/dL).  Liver Function Tests: Recent Labs  Lab 07/31/20 1304 08/01/20 0015 08/02/20 0246  AST 50* 40 27  ALT 18 17 16   ALKPHOS 70 61 56  BILITOT 1.6* 1.5* 1.3*  PROT 6.8 5.6* 5.6*  ALBUMIN 3.9 3.2* 3.2*    Recent Labs  Lab 07/31/20 1304  LIPASE 24    No results for input(s): AMMONIA in the last 168 hours.  Coagulation Profile: Recent Labs  Lab 07/31/20 1304  INR 1.1     Cardiac Enzymes: Recent Labs  Lab 07/31/20 1304 08/01/20 0015  CKTOTAL 731* 448*     BNP (last 3 results) No results for input(s): PROBNP in the last 8760 hours.  Lipid Profile: Recent Labs    08/01/20 0015 08/02/20 0246  CHOL 209* 199  HDL 56 53  LDLCALC 140* 130*  TRIG 67 79  CHOLHDL 3.7 3.8     Thyroid Function Tests: No results for input(s): TSH, T4TOTAL, FREET4, T3FREE, THYROIDAB in the last 72 hours.  Anemia Panel: No results for input(s): VITAMINB12, FOLATE, FERRITIN, TIBC, IRON, RETICCTPCT in the last 72 hours.  Urine analysis: No results found for: COLORURINE, APPEARANCEUR, LABSPEC, PHURINE, GLUCOSEU, HGBUR, BILIRUBINUR, KETONESUR, PROTEINUR, UROBILINOGEN, NITRITE, LEUKOCYTESUR  Sepsis Labs: Lactic Acid, Venous    Component Value Date/Time   LATICACIDVEN 2.2 (HH) 07/31/2020 2023    MICROBIOLOGY: Recent Results (from the past 240 hour(s))  Resp Panel by RT-PCR (Flu A&B, Covid) Nasopharyngeal Swab     Status: None   Collection Time: 07/31/20  1:26 PM   Specimen: Nasopharyngeal Swab; Nasopharyngeal(NP) swabs in vial transport  medium  Result Value Ref Range Status   SARS Coronavirus 2 by RT PCR NEGATIVE NEGATIVE Final    Comment: (NOTE) SARS-CoV-2 target nucleic acids are NOT DETECTED.  The SARS-CoV-2 RNA is generally detectable in upper respiratory specimens during the acute phase of infection. The lowest concentration of SARS-CoV-2 viral copies this assay can detect is 138 copies/mL. A negative result does not preclude SARS-Cov-2 infection and should not be used as the sole basis for treatment or other patient management decisions. A negative result may occur with  improper specimen collection/handling, submission of specimen other than nasopharyngeal swab, presence of viral mutation(s) within the areas targeted by this assay, and inadequate number of viral copies(<138 copies/mL). A negative result must be combined with clinical observations, patient history, and epidemiological information. The expected result is Negative.  Fact Sheet for Patients:  BloggerCourse.com  Fact Sheet for Healthcare Providers:  SeriousBroker.it  This test is no t yet approved or cleared by the [place] FDA and  has been authorized for detection and/or diagnosis of SARS-CoV-2 by FDA under an Emergency Use Authorization (EUA). This EUA will remain  in effect (meaning this test can be used) for the duration of the COVID-19 declaration under Section 564(b)(1) of the Act, 21 U.S.C.section 360bbb-3(b)(1), unless the authorization is terminated  or revoked sooner.       Influenza A by PCR NEGATIVE NEGATIVE Final   Influenza B by PCR NEGATIVE NEGATIVE Final    Comment: (NOTE) The Xpert Xpress SARS-CoV-2/FLU/RSV plus assay is intended as an aid in the diagnosis of influenza from Nasopharyngeal swab specimens and should not be used as a sole basis for treatment. Nasal washings and aspirates are unacceptable for Xpert Xpress SARS-CoV-2/FLU/RSV testing.  Fact Sheet for  Patients: BloggerCourse.com  Fact Sheet for Healthcare Providers: SeriousBroker.it  This test is not yet approved or cleared by the [place] FDA and has been authorized for detection and/or diagnosis of SARS-CoV-2 by FDA under an Emergency Use Authorization (EUA). This EUA will remain in effect (meaning this test can be used) for the duration of the COVID-19 declaration under Section 564(b)(1) of the Act, 21 U.S.C. section 360bbb-3(b)(1), unless the authorization is terminated or revoked.  Performed at [facility], [address], [place], [place] [address]     RADIOLOGY STUDIES/RESULTS: CT Head Wo Contrast  Result  Date: 07/31/2020 CLINICAL DATA:  Fall today with facial trauma. EXAM: CT HEAD WITHOUT CONTRAST CT MAXILLOFACIAL WITHOUT CONTRAST CT CERVICAL SPINE WITHOUT CONTRAST TECHNIQUE: Multidetector CT imaging of the head, cervical spine, and maxillofacial structures were performed using the standard protocol without intravenous contrast. Multiplanar CT image reconstructions of the cervical spine and maxillofacial structures were also generated. COMPARISON:  None. FINDINGS: CT HEAD FINDINGS Brain: There is no evidence of acute intracranial hemorrhage, mass lesion, brain edema or extra-axial fluid collection. Mild-to-moderate atrophy with prominence of the ventricles and subarachnoid spaces. There are patchy small vessel ischemic changes in the periventricular white matter and basal ganglia bilaterally. There is no CT evidence of acute cortical infarction. Vascular: Intracranial vascular calcifications. No hyperdense vessel identified. Skull: Negative for fracture or focal lesion. Other: None. CT MAXILLOFACIAL FINDINGS Osseous: There are acute, mildly displaced fractures of the anterior and posterolateral walls of the right maxillary sinus with associated layering blood in the sinus. There is also a mildly comminuted and depressed  fracture of the right zygomatic arch. Possible minimal diastasis at the right frontozygomatic suture. No evidence of orbital or other facial fracture. The mandible and temporomandibular joints are intact. There are moderate TMJ degenerative changes bilaterally. Orbits: No evidence of orbital fracture. The globes are intact. Previous lens surgery and scleral banding bilaterally. No evidence of orbital hematoma. The extraocular muscles and optic nerves appear normal. Sinuses: Layering blood in the right maxillary sinus. The additional paranasal sinuses are clear without air-fluid levels. The mastoid air cells and middle ears are clear. Soft tissues: Soft tissue swelling over the right cheek and malar region without focal hematoma or foreign body. CT CERVICAL SPINE FINDINGS Alignment: Straightening with minimal retrolisthesis at C5-6 and anterolisthesis at C7-T1 and T1-2. Skull base and vertebrae: No evidence of acute fracture or traumatic subluxation. Soft tissues and spinal canal: Mild ossification of the ligamentum nuchae. No prevertebral fluid or swelling. No visible canal hematoma. Disc levels: Multilevel mild spondylosis for age, with disc space narrowing and uncinate spurring greatest at C5-6. Scattered facet degenerative changes. Upper chest: Bilateral carotid atherosclerosis. Other: None. IMPRESSION: 1. Mildly displaced fractures of the right zygomatic arch, anterior and posterolateral walls of the right maxillary sinus and possible mild diastasis of the right frontozygomatic suture (tripod fracture variant). No other facial fractures. 2. Overlying right facial soft tissue swelling without orbital hematoma or foreign body. 3. No acute intracranial or calvarial findings. Mild atrophy and chronic small vessel ischemic changes. 4. No evidence of acute cervical spine fracture, traumatic subluxation or static signs of instability. Multilevel cervical spondylosis. Electronically Signed   By: Carey Bullocks M.D.    On: 07/31/2020 14:22   CT Cervical Spine Wo Contrast  Result Date: 07/31/2020 CLINICAL DATA:  Fall today with facial trauma. EXAM: CT HEAD WITHOUT CONTRAST CT MAXILLOFACIAL WITHOUT CONTRAST CT CERVICAL SPINE WITHOUT CONTRAST TECHNIQUE: Multidetector CT imaging of the head, cervical spine, and maxillofacial structures were performed using the standard protocol without intravenous contrast. Multiplanar CT image reconstructions of the cervical spine and maxillofacial structures were also generated. COMPARISON:  None. FINDINGS: CT HEAD FINDINGS Brain: There is no evidence of acute intracranial hemorrhage, mass lesion, brain edema or extra-axial fluid collection. Mild-to-moderate atrophy with prominence of the ventricles and subarachnoid spaces. There are patchy small vessel ischemic changes in the periventricular white matter and basal ganglia bilaterally. There is no CT evidence of acute cortical infarction. Vascular: Intracranial vascular calcifications. No hyperdense vessel identified. Skull: Negative for fracture or focal lesion. Other: None. CT  MAXILLOFACIAL FINDINGS Osseous: There are acute, mildly displaced fractures of the anterior and posterolateral walls of the right maxillary sinus with associated layering blood in the sinus. There is also a mildly comminuted and depressed fracture of the right zygomatic arch. Possible minimal diastasis at the right frontozygomatic suture. No evidence of orbital or other facial fracture. The mandible and temporomandibular joints are intact. There are moderate TMJ degenerative changes bilaterally. Orbits: No evidence of orbital fracture. The globes are intact. Previous lens surgery and scleral banding bilaterally. No evidence of orbital hematoma. The extraocular muscles and optic nerves appear normal. Sinuses: Layering blood in the right maxillary sinus. The additional paranasal sinuses are clear without air-fluid levels. The mastoid air cells and middle ears are clear.  Soft tissues: Soft tissue swelling over the right cheek and malar region without focal hematoma or foreign body. CT CERVICAL SPINE FINDINGS Alignment: Straightening with minimal retrolisthesis at C5-6 and anterolisthesis at C7-T1 and T1-2. Skull base and vertebrae: No evidence of acute fracture or traumatic subluxation. Soft tissues and spinal canal: Mild ossification of the ligamentum nuchae. No prevertebral fluid or swelling. No visible canal hematoma. Disc levels: Multilevel mild spondylosis for age, with disc space narrowing and uncinate spurring greatest at C5-6. Scattered facet degenerative changes. Upper chest: Bilateral carotid atherosclerosis. Other: None. IMPRESSION: 1. Mildly displaced fractures of the right zygomatic arch, anterior and posterolateral walls of the right maxillary sinus and possible mild diastasis of the right frontozygomatic suture (tripod fracture variant). No other facial fractures. 2. Overlying right facial soft tissue swelling without orbital hematoma or foreign body. 3. No acute intracranial or calvarial findings. Mild atrophy and chronic small vessel ischemic changes. 4. No evidence of acute cervical spine fracture, traumatic subluxation or static signs of instability. Multilevel cervical spondylosis. Electronically Signed   By: Carey Bullocks M.D.   On: 07/31/2020 14:22   MR BRAIN WO CONTRAST  Result Date: 08/01/2020 CLINICAL DATA:  Acute stroke suspected EXAM: MRI HEAD WITHOUT CONTRAST TECHNIQUE: Multiplanar, multiecho pulse sequences of the brain and surrounding structures were obtained without intravenous contrast. COMPARISON:  CT and CTA from earlier today FINDINGS: Brain: Moderate to large area of cortically based infarction in the high and parasagittal left frontal lobe, best aligned to the ACA distribution. A tiny cortically based infarct is seen at the cortex of the anterior left temporal lobe. Confluent chronic small vessel ischemia in the cerebral white matter and  pons. No acute hemorrhage, hydrocephalus, or masslike finding. Generalized brain atrophy. Vascular: Recent CTA.  No additional findings. Skull and upper cervical spine: Normal marrow signal Sinuses/Orbits: Right maxillary hemosinus, known. Bilateral cataract resection. IMPRESSION: 1. Moderate to large acute left ACA branch infarct. 2. Tiny acute infarct in the left anterior temporal cortex. 3. Atrophy and extensive chronic small vessel ischemia. Electronically Signed   By: Marnee Spring M.D.   On: 08/01/2020 12:00   DG Pelvis Portable  Result Date: 07/31/2020 CLINICAL DATA:  Status post fall. EXAM: PORTABLE PELVIS 1-2 VIEWS COMPARISON:  None. FINDINGS: There is no evidence of pelvic fracture or diastasis. Fixation hardware for healed bilateral intertrochanteric fractures noted. No pelvic bone lesions are seen. Lower lumbar spondylosis. IMPRESSION: No acute abnormality. Electronically Signed   By: Drusilla Kanner M.D.   On: 07/31/2020 15:12   CT CEREBRAL PERFUSION W CONTRAST  Result Date: 08/01/2020 CLINICAL DATA:  Stroke suspected EXAM: CT PERFUSION BRAIN TECHNIQUE: Multiphase CT imaging of the brain was performed following IV bolus contrast injection. Subsequent parametric perfusion maps were calculated using  RAPID software. CONTRAST:  75mL OMNIPAQUE IOHEXOL 350 MG/ML SOLN COMPARISON:  Head CT and CTA from earlier today FINDINGS: CT Brain Perfusion Findings: CBF (<30%) Volume: 0mL Perfusion (Tmax>6.0s) volume: 0mL IMPRESSION: No core infarct or cerebral ischemia by standard perfusion thresholds. Electronically Signed   By: Marnee SpringJonathon  Watts M.D.   On: 08/01/2020 05:13   DG Chest Port 1 View  Result Date: 07/31/2020 CLINICAL DATA:  Status post fall. EXAM: PORTABLE CHEST 1 VIEW COMPARISON:  Single-view of the chest 11/21/2018. FINDINGS: The patient is status post CABG. There is cardiomegaly. Aortic atherosclerosis. Lungs clear. No pneumothorax or pleural fluid. No acute or focal bony abnormality.  IMPRESSION: No acute disease. Electronically Signed   By: Drusilla Kannerhomas  Dalessio M.D.   On: 07/31/2020 15:13   ECHOCARDIOGRAM COMPLETE  Result Date: 08/01/2020    ECHOCARDIOGRAM REPORT   Patient Name:   Ellen Barajas Date of Exam: 08/01/2020 Medical Rec #:  [id number]    Height:       63.0 in Accession #:    [id number]   Weight:       150.8 lb Date of Birth:  05/25/1928     BSA:          1.715 m Patient Age:    89 years     BP:           151/90 mmHg Patient Gender: F            HR:           88 bpm. Exam Location:  Inpatient Procedure: 2D Echo, Cardiac Doppler and Color Doppler Indications:    NSTEMI I21.4  History:        Patient has prior history of Echocardiogram examinations, most                 recent 10/14/2019. CAD and Previous Myocardial Infarction, Prior                 CABG, Arrythmias:Atrial Fibrillation; Risk Factors:Dyslipidemia.  Sonographer:    Renella CunasJulia Swaim RDCS Referring Phys: [id number] ALLISON WOLFE IMPRESSIONS  1. Left ventricular ejection fraction, by estimation, is 25 to 30%. The left ventricle has severely decreased function. The left ventricle demonstrates regional wall motion abnormalities (see scoring diagram/findings for description). The left ventricular internal cavity size was mildly dilated. Left ventricular diastolic parameters are indeterminate.  2. Right ventricular systolic function is mildly reduced. The right ventricular size is normal. There is severely elevated pulmonary artery systolic pressure. The estimated right ventricular systolic pressure is 71.6 mmHg.  3. Left atrial size was mildly dilated.  4. The mitral valve is abnormal. Mild to moderate mitral valve regurgitation. The mean mitral valve gradient is 2.4 mmHg with average heart rate of 91 bpm. Severe mitral annular calcification.  5. Tricuspid valve regurgitation is mild to moderate.  6. The inferior vena cava is dilated in size with <50% respiratory variability, suggesting right atrial pressure of 15 mmHg.  7. The aortic valve is  tricuspid. Aortic valve regurgitation is mild to moderate. Aortic regurgitation PHT measures 450 msec. Aortic valve mean gradient measures 11.0 mmHg. Low Flow Low Gradient Aortic Stenosis suspected, in the setting of diminished stroke volume index and valve morphology. Comparison(s): A prior study was performed on 10/14/2019. Prior images reviewed side by side. Significant changes in LVEF have occurred. FINDINGS  Left Ventricle: Left ventricular ejection fraction, by estimation, is 25 to 30%. The left ventricle has severely decreased function. The left ventricle demonstrates regional wall motion abnormalities.  The left ventricular internal cavity size was mildly  dilated. There is no left ventricular hypertrophy. Left ventricular diastolic parameters are indeterminate.  LV Wall Scoring: The anterior wall, antero-lateral wall, and posterior wall are hypokinetic. Right Ventricle: The right ventricular size is normal. No increase in right ventricular wall thickness. Right ventricular systolic function is mildly reduced. There is severely elevated pulmonary artery systolic pressure. The tricuspid regurgitant velocity is 3.76 m/s, and with an assumed right atrial pressure of 15 mmHg, the estimated right ventricular systolic pressure is 71.6 mmHg. Left Atrium: Left atrial size was mildly dilated. Right Atrium: Right atrial size was normal in size. Pericardium: Trivial pericardial effusion is present. Mitral Valve: The mitral valve is abnormal. Severe mitral annular calcification. Mild to moderate mitral valve regurgitation. The mean mitral valve gradient is 2.4 mmHg with average heart rate of 91 bpm. Tricuspid Valve: The tricuspid valve is grossly normal. Tricuspid valve regurgitation is mild to moderate. Aortic Valve: The aortic valve is tricuspid. Aortic valve regurgitation is mild to moderate. Aortic regurgitation PHT measures 450 msec. Aortic valve mean gradient measures 11.0 mmHg. Aortic valve peak gradient measures  20.2 mmHg. Aortic valve area, by VTI measures 0.88 cm. Pulmonic Valve: The pulmonic valve was not well visualized. Pulmonic valve regurgitation is mild. No evidence of pulmonic stenosis. Aorta: The aortic root is normal in size and structure. Venous: The inferior vena cava is dilated in size with less than 50% respiratory variability, suggesting right atrial pressure of 15 mmHg. IAS/Shunts: The atrial septum is grossly normal.  LEFT VENTRICLE PLAX 2D LVIDd:         5.70 cm LVIDs:         4.90 cm LV PW:         0.80 cm LV IVS:        0.80 cm LVOT diam:     2.00 cm LV SV:         41 LV SV Index:   24 LVOT Area:     3.14 cm  LV Volumes (MOD) LV vol d, MOD A2C: 128.0 ml LV vol d, MOD A4C: 123.0 ml LV vol s, MOD A2C: 95.6 ml LV vol s, MOD A4C: 85.6 ml LV SV MOD A2C:     32.4 ml LV SV MOD A4C:     123.0 ml LV SV MOD BP:      33.2 ml RIGHT VENTRICLE RV S prime:     6.08 cm/s TAPSE (M-mode): 1.1 cm LEFT ATRIUM             Index       RIGHT ATRIUM           Index LA diam:        3.90 cm 2.27 cm/m  RA Area:     16.50 cm LA Vol (A2C):   52.0 ml 30.32 ml/m RA Volume:   41.50 ml  24.20 ml/m LA Vol (A4C):   52.6 ml 30.67 ml/m LA Biplane Vol: 57.0 ml 33.24 ml/m  AORTIC VALVE AV Area (Vmax):    0.95 cm AV Area (Vmean):   0.89 cm AV Area (VTI):     0.88 cm AV Vmax:           225.00 cm/s AV Vmean:          151.500 cm/s AV VTI:            0.464 m AV Peak Grad:      20.2 mmHg AV Mean Grad:  11.0 mmHg LVOT Vmax:         67.80 cm/s LVOT Vmean:        42.900 cm/s LVOT VTI:          0.130 m LVOT/AV VTI ratio: 0.28 AI PHT:            450 msec  AORTA Ao Root diam: 3.30 cm Ao Asc diam:  3.30 cm MITRAL VALVE              TRICUSPID VALVE MV Mean grad: 2.4 mmHg    TR Peak grad:   56.6 mmHg MR Peak grad: 122.3 mmHg  TR Vmax:        376.00 cm/s MR Mean grad: 76.0 mmHg MR Vmax:      553.00 cm/s SHUNTS MR Vmean:     408.0 cm/s  Systemic VTI:  0.13 m                           Systemic Diam: 2.00 cm Riley Lam MD  Electronically signed by Riley Lam MD Signature Date/Time: 08/01/2020/1:52:37 PM    Final    CT HEAD CODE STROKE WO CONTRAST  Result Date: 08/01/2020 CLINICAL DATA:  Code stroke.  Left MCA syndrome EXAM: CT HEAD WITHOUT CONTRAST TECHNIQUE: Contiguous axial images were obtained from the base of the skull through the vertex without intravenous contrast. COMPARISON:  07/31/2020 FINDINGS: Brain: Acute infarct in the parasagittal left frontal lobe involving a moderate area, left ACA territory. No MCA territory infarct is seen. Chronic small vessel ischemia in the white matter. Cerebral volume loss with ventriculomegaly. No acute hemorrhage Vascular: No hyperdense vessel Skull: Negative for fracture Sinuses/Orbits: Right facial fractures as described on preceding face CT. Other: These results were called by telephone at the time of interpretation on 08/01/2020 at 4:49 am to provider [facility] , who verbally acknowledged these results. ASPECTS [facility] Stroke Program Early CT Score) - Ganglionic level infarction (caudate, lentiform nuclei, internal capsule, insula, M1-M3 cortex): 7 - Supraganglionic infarction (M4-M6 cortex): 3 Total score (0-10 with 10 being normal): 10 IMPRESSION: 1. Moderate acute infarct in the parasagittal left frontal lobe, ACA territory. 2. Aging brain. Electronically Signed   By: Marnee Spring M.D.   On: 08/01/2020 04:56   CT ANGIO HEAD CODE STROKE  Result Date: 08/01/2020 CLINICAL DATA:  Acute stroke EXAM: CT ANGIOGRAPHY HEAD AND NECK TECHNIQUE: Multidetector CT imaging of the head and neck was performed using the standard protocol during bolus administration of intravenous contrast. Multiplanar CT image reconstructions and MIPs were obtained to evaluate the vascular anatomy. Carotid stenosis measurements (when applicable) are obtained utilizing NASCET criteria, using the distal internal carotid diameter as the denominator. CONTRAST:  75mL OMNIPAQUE IOHEXOL 350 MG/ML SOLN  COMPARISON:  None available FINDINGS: CTA NECK FINDINGS Aortic arch: Atheromatous plaque with 3 vessel branching. Brachiocephalic artery origin is not covered. Right carotid system: Diffuse atheromatous wall thickening of the common carotid extending to the bifurcation. No flow limiting stenosis or ulceration. Left carotid system: Diffuse atheromatous plaque of mixed density involving the common carotid and extending into the proximal ICA. No flow limiting stenosis or ulceration Vertebral arteries: Proximal subclavian atherosclerosis on both sides without flow reducing stenosis. Although limited by motion, there is high-grade left V1 segment stenosis. Even worse stenosis in the left V3 segment with no flow by the dura and reconstitution serving the left PICA from the basilar. Dominant right vertebral artery is diffusely patent Skeleton: Recent dedicated  cervical spine and facial CT. No interval findings. Other neck: No acute finding. Upper chest: Small pleural effusions. Vague nodular density in the left upper lobe with hazy margins, favoring a inflammatory process Review of the MIP images confirms the above findings CTA HEAD FINDINGS Anterior circulation: Left MCA type symptoms. There is intermittent severe narrowing with flow gap involving the left M2 and M3 branches, but flow is seen beyond the major vessels there is actually even worse stenosis at the right M1 segment with accentuated lenticulostriate and downstream reconstitution. Bilateral ACA atherosclerosis with especially right A2 segment narrowing. 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 Posterior circulation: Dominant right vertebral artery shows diffuse atheromatous plaque with no flow limiting stenosis. Reconstituted distal left V4 segment serving the left PICA. The basilar is smooth and diffusely patent. Atheromatous irregularity of the bilateral PCA beyond the P2 segments. Venous sinuses: Unremarkable in the arterial phase  Anatomic variants: None significant Review of the MIP images confirms the above findings IMPRESSION: 1. Negative for large vessel occlusion. 2. Advanced intracranial atherosclerosis. Most notable stenoses are at the right M1 and left M2 segments. 3. 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 4. 2 mm A-comm aneurysm. Electronically Signed   By: Marnee Spring M.D.   On: 08/01/2020 05:05   CT ANGIO NECK CODE STROKE  Result Date: 08/01/2020 CLINICAL DATA:  Acute stroke EXAM: CT ANGIOGRAPHY HEAD AND NECK TECHNIQUE: Multidetector CT imaging of the head and neck was performed using the standard protocol during bolus administration of intravenous contrast. Multiplanar CT image reconstructions and MIPs were obtained to evaluate the vascular anatomy. Carotid stenosis measurements (when applicable) are obtained utilizing NASCET criteria, using the distal internal carotid diameter as the denominator. CONTRAST:  34mL OMNIPAQUE IOHEXOL 350 MG/ML SOLN COMPARISON:  None available FINDINGS: CTA NECK FINDINGS Aortic arch: Atheromatous plaque with 3 vessel branching. Brachiocephalic artery origin is not covered. Right carotid system: Diffuse atheromatous wall thickening of the common carotid extending to the bifurcation. No flow limiting stenosis or ulceration. Left carotid system: Diffuse atheromatous plaque of mixed density involving the common carotid and extending into the proximal ICA. No flow limiting stenosis or ulceration Vertebral arteries: Proximal subclavian atherosclerosis on both sides without flow reducing stenosis. Although limited by motion, there is high-grade left V1 segment stenosis. Even worse stenosis in the left V3 segment with no flow by the dura and reconstitution serving the left PICA from the basilar. Dominant right vertebral artery is diffusely patent Skeleton: Recent dedicated cervical spine and facial  CT. No interval findings. Other neck: No acute finding. Upper chest: Small pleural effusions. Vague nodular density in the left upper lobe with hazy margins, favoring a inflammatory process Review of the MIP images confirms the above findings CTA HEAD FINDINGS Anterior circulation: Left MCA type symptoms. There is intermittent severe narrowing with flow gap involving the left M2 and M3 branches, but flow is seen beyond the major vessels there is actually even worse stenosis at the right M1 segment with accentuated lenticulostriate and downstream reconstitution. Bilateral ACA atherosclerosis with especially right A2 segment narrowing. 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 Posterior circulation: Dominant right vertebral artery shows diffuse atheromatous plaque with no flow limiting stenosis. Reconstituted distal left V4 segment serving the left PICA. The basilar is smooth and diffusely patent. Atheromatous irregularity of the bilateral PCA beyond the P2 segments. Venous sinuses: Unremarkable in  the arterial phase Anatomic variants: None significant Review of the MIP images confirms the above findings IMPRESSION: 1. Negative for large vessel occlusion. 2. Advanced intracranial atherosclerosis. Most notable stenoses are at the right M1 and left M2 segments. 3. 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 4. 2 mm A-comm aneurysm. Electronically Signed   By: Marnee Spring M.D.   On: 08/01/2020 05:05   CT Maxillofacial WO CM  Result Date: 07/31/2020 CLINICAL DATA:  Fall today with facial trauma. EXAM: CT HEAD WITHOUT CONTRAST CT MAXILLOFACIAL WITHOUT CONTRAST CT CERVICAL SPINE WITHOUT CONTRAST TECHNIQUE: Multidetector CT imaging of the head, cervical spine, and maxillofacial structures were performed using the standard protocol without intravenous contrast. Multiplanar CT image reconstructions  of the cervical spine and maxillofacial structures were also generated. COMPARISON:  None. FINDINGS: CT HEAD FINDINGS Brain: There is no evidence of acute intracranial hemorrhage, mass lesion, brain edema or extra-axial fluid collection. Mild-to-moderate atrophy with prominence of the ventricles and subarachnoid spaces. There are patchy small vessel ischemic changes in the periventricular white matter and basal ganglia bilaterally. There is no CT evidence of acute cortical infarction. Vascular: Intracranial vascular calcifications. No hyperdense vessel identified. Skull: Negative for fracture or focal lesion. Other: None. CT MAXILLOFACIAL FINDINGS Osseous: There are acute, mildly displaced fractures of the anterior and posterolateral walls of the right maxillary sinus with associated layering blood in the sinus. There is also a mildly comminuted and depressed fracture of the right zygomatic arch. Possible minimal diastasis at the right frontozygomatic suture. No evidence of orbital or other facial fracture. The mandible and temporomandibular joints are intact. There are moderate TMJ degenerative changes bilaterally. Orbits: No evidence of orbital fracture. The globes are intact. Previous lens surgery and scleral banding bilaterally. No evidence of orbital hematoma. The extraocular muscles and optic nerves appear normal. Sinuses: Layering blood in the right maxillary sinus. The additional paranasal sinuses are clear without air-fluid levels. The mastoid air cells and middle ears are clear. Soft tissues: Soft tissue swelling over the right cheek and malar region without focal hematoma or foreign body. CT CERVICAL SPINE FINDINGS Alignment: Straightening with minimal retrolisthesis at C5-6 and anterolisthesis at C7-T1 and T1-2. Skull base and vertebrae: No evidence of acute fracture or traumatic subluxation. Soft tissues and spinal canal: Mild ossification of the ligamentum nuchae. No prevertebral fluid or swelling. No  visible canal hematoma. Disc levels: Multilevel mild spondylosis for age, with disc space narrowing and uncinate spurring greatest at C5-6. Scattered facet degenerative changes. Upper chest: Bilateral carotid atherosclerosis. Other: None. IMPRESSION: 1. Mildly displaced fractures of the right zygomatic arch, anterior and posterolateral walls of the right maxillary sinus and possible mild diastasis of the right frontozygomatic suture (tripod fracture variant). No other facial fractures. 2. Overlying right facial soft tissue swelling without orbital hematoma or foreign body. 3. No acute intracranial or calvarial findings. Mild atrophy and chronic small vessel ischemic changes. 4. No evidence of acute cervical spine fracture, traumatic subluxation or static signs of instability. Multilevel cervical spondylosis. Electronically Signed   By: Carey Bullocks M.D.   On: 07/31/2020 14:22     LOS: 2 days   Jeoffrey Massed, MD  [facility]    To contact the attending provider between 7A-7P or the covering provider during after hours 7P-7A, please log into the web site [email or web address] and access using universal [place] password for that web site. If you do not have  the password, please call the hospital operator.  08/02/2020, 11:21 AM

## 2020-08-02 NOTE — Progress Notes (Addendum)
STROKE TEAM PROGRESS NOTE   ATTENDING NOTE: I reviewed above note and agree with the assessment and plan. Pt was seen and examined.   Niece at bedside, patient sitting in bed for lunch.  No acute event overnight.  Orientated to place and people, not to age or time.  Able to name and repeat with mild dysarthria.  No gaze palsy, mild right facial droop, right upper extremity 3 -/5, right lower extremity 3-/5 proximal and 2/5 distal.  Some improvement of right arm and leg strength compared with yesterday.  Was on aspirin 325 yesterday, agree with cardiology aspirin 81 and Plavix 75 DAPT today.  Will continue monitoring neurologically, if neuro stable on Tuesday, may resume Xarelto for A. fib and stroke prevention.  Agree with cardiology that was Xarelto resumed, will continue Plavix and DC aspirin.  Continue Lipitor, so far tolerating well.  We will follow  Ellen PlanJindong Keante Urizar, MD PhD Stroke Neurology 08/02/2020 2:51 PM    INTERVAL HISTORY Patient is seen, lying in hospital with right arm on pillow. Her niece is at the bedside providing support. Patient has no complaints at this time.   Vitals:   08/02/20 0000 08/02/20 0400 08/02/20 0732 08/02/20 1202  BP: 125/81 118/71 (!) 154/88 118/81  Pulse: 91 71 (!) 103 (!) 102  Resp: 18 16 14 20   Temp: 98.2 F (36.8 C) 97.9 F (36.6 C) 98.2 F (36.8 C) 98.6 F (37 C)  TempSrc: Temporal Temporal Oral Oral  SpO2: 97% 98% 99%   Weight:      Height:       CBC:  Recent Labs  Lab 07/31/20 1304 07/31/20 1309 08/01/20 0015 08/02/20 0246  WBC 9.7  --  9.9 7.9  NEUTROABS 8.4*  --   --   --   HGB 13.8   < > 13.4 13.2  HCT 43.6   < > 40.3 40.5  MCV 90.8  --  88.2 90.4  PLT 243  --  207 200   < > = values in this interval not displayed.    Basic Metabolic Panel:  Recent Labs  Lab 07/31/20 1304 07/31/20 1309 08/01/20 0015 08/02/20 0246  NA 137   < > 134* 137  K 4.5   < > 4.2 3.9  CL 103  --  105 104  CO2 23  --  22 25  GLUCOSE 123*  --  145*  95  BUN 21  --  22 16  CREATININE 1.05*  --  0.88 0.84  CALCIUM 9.2  --  8.9 8.9  MG 2.2  --   --   --   PHOS 4.5  --   --   --    < > = values in this interval not displayed.     Lipid Panel:  Recent Labs  Lab 08/02/20 0246  CHOL 199  TRIG 79  HDL 53  CHOLHDL 3.8  VLDL 16  LDLCALC 161130*     HgbA1c:  Recent Labs  Lab 08/02/20 0246  HGBA1C 6.0*   Urine Drug Screen: No results for input(s): LABOPIA, COCAINSCRNUR, LABBENZ, AMPHETMU, THCU, LABBARB in the last 168 hours.  Alcohol Level No results for input(s): ETH in the last 168 hours.  IMAGING past 24 hours ECHOCARDIOGRAM COMPLETE  Result Date: 08/01/2020    ECHOCARDIOGRAM REPORT   Patient Name:   Ellen Barajas Date of Exam: 08/01/2020 Medical Rec #:  [id number]    Height:       63.0 in Accession #:  [id number]   Weight:       150.8 lb Date of Birth:  11/21/1928     BSA:          1.715 m Patient Age:    87 years     BP:           151/90 mmHg Patient Gender: F            HR:           88 bpm. Exam Location:  Inpatient Procedure: 2D Echo, Cardiac Doppler and Color Doppler Indications:    NSTEMI I21.4  History:        Patient has prior history of Echocardiogram examinations, most                 recent 10/14/2019. CAD and Previous Myocardial Infarction, Prior                 CABG, Arrythmias:Atrial Fibrillation; Risk Factors:Dyslipidemia.  Sonographer:    Renella Cunas RDCS Referring Phys: [id number] ALLISON WOLFE IMPRESSIONS  1. Left ventricular ejection fraction, by estimation, is 25 to 30%. The left ventricle has severely decreased function. The left ventricle demonstrates regional wall motion abnormalities (see scoring diagram/findings for description). The left ventricular internal cavity size was mildly dilated. Left ventricular diastolic parameters are indeterminate.  2. Right ventricular systolic function is mildly reduced. The right ventricular size is normal. There is severely elevated pulmonary artery systolic pressure. The estimated right  ventricular systolic pressure is 71.6 mmHg.  3. Left atrial size was mildly dilated.  4. The mitral valve is abnormal. Mild to moderate mitral valve regurgitation. The mean mitral valve gradient is 2.4 mmHg with average heart rate of 91 bpm. Severe mitral annular calcification.  5. Tricuspid valve regurgitation is mild to moderate.  6. The inferior vena cava is dilated in size with <50% respiratory variability, suggesting right atrial pressure of 15 mmHg.  7. The aortic valve is tricuspid. Aortic valve regurgitation is mild to moderate. Aortic regurgitation PHT measures 450 msec. Aortic valve mean gradient measures 11.0 mmHg. Low Flow Low Gradient Aortic Stenosis suspected, in the setting of diminished stroke volume index and valve morphology. Comparison(s): A prior study was performed on 10/14/2019. Prior images reviewed side by side. Significant changes in LVEF have occurred. FINDINGS  Left Ventricle: Left ventricular ejection fraction, by estimation, is 25 to 30%. The left ventricle has severely decreased function. The left ventricle demonstrates regional wall motion abnormalities. The left ventricular internal cavity size was mildly  dilated. There is no left ventricular hypertrophy. Left ventricular diastolic parameters are indeterminate.  LV Wall Scoring: The anterior wall, antero-lateral wall, and posterior wall are hypokinetic. Right Ventricle: The right ventricular size is normal. No increase in right ventricular wall thickness. Right ventricular systolic function is mildly reduced. There is severely elevated pulmonary artery systolic pressure. The tricuspid regurgitant velocity is 3.76 m/s, and with an assumed right atrial pressure of 15 mmHg, the estimated right ventricular systolic pressure is 71.6 mmHg. Left Atrium: Left atrial size was mildly dilated. Right Atrium: Right atrial size was normal in size. Pericardium: Trivial pericardial effusion is present. Mitral Valve: The mitral valve is abnormal.  Severe mitral annular calcification. Mild to moderate mitral valve regurgitation. The mean mitral valve gradient is 2.4 mmHg with average heart rate of 91 bpm. Tricuspid Valve: The tricuspid valve is grossly normal. Tricuspid valve regurgitation is mild to moderate. Aortic Valve: The aortic valve is tricuspid. Aortic valve regurgitation is mild to moderate. Aortic  regurgitation PHT measures 450 msec. Aortic valve mean gradient measures 11.0 mmHg. Aortic valve peak gradient measures 20.2 mmHg. Aortic valve area, by VTI measures 0.88 cm. Pulmonic Valve: The pulmonic valve was not well visualized. Pulmonic valve regurgitation is mild. No evidence of pulmonic stenosis. Aorta: The aortic root is normal in size and structure. Venous: The inferior vena cava is dilated in size with less than 50% respiratory variability, suggesting right atrial pressure of 15 mmHg. IAS/Shunts: The atrial septum is grossly normal.  LEFT VENTRICLE PLAX 2D LVIDd:         5.70 cm LVIDs:         4.90 cm LV PW:         0.80 cm LV IVS:        0.80 cm LVOT diam:     2.00 cm LV SV:         41 LV SV Index:   24 LVOT Area:     3.14 cm  LV Volumes (MOD) LV vol d, MOD A2C: 128.0 ml LV vol d, MOD A4C: 123.0 ml LV vol s, MOD A2C: 95.6 ml LV vol s, MOD A4C: 85.6 ml LV SV MOD A2C:     32.4 ml LV SV MOD A4C:     123.0 ml LV SV MOD BP:      33.2 ml RIGHT VENTRICLE RV S prime:     6.08 cm/s TAPSE (M-mode): 1.1 cm LEFT ATRIUM             Index       RIGHT ATRIUM           Index LA diam:        3.90 cm 2.27 cm/m  RA Area:     16.50 cm LA Vol (A2C):   52.0 ml 30.32 ml/m RA Volume:   41.50 ml  24.20 ml/m LA Vol (A4C):   52.6 ml 30.67 ml/m LA Biplane Vol: 57.0 ml 33.24 ml/m  AORTIC VALVE AV Area (Vmax):    0.95 cm AV Area (Vmean):   0.89 cm AV Area (VTI):     0.88 cm AV Vmax:           225.00 cm/s AV Vmean:          151.500 cm/s AV VTI:            0.464 m AV Peak Grad:      20.2 mmHg AV Mean Grad:      11.0 mmHg LVOT Vmax:         67.80 cm/s LVOT Vmean:         42.900 cm/s LVOT VTI:          0.130 m LVOT/AV VTI ratio: 0.28 AI PHT:            450 msec  AORTA Ao Root diam: 3.30 cm Ao Asc diam:  3.30 cm MITRAL VALVE              TRICUSPID VALVE MV Mean grad: 2.4 mmHg    TR Peak grad:   56.6 mmHg MR Peak grad: 122.3 mmHg  TR Vmax:        376.00 cm/s MR Mean grad: 76.0 mmHg MR Vmax:      553.00 cm/s SHUNTS MR Vmean:     408.0 cm/s  Systemic VTI:  0.13 m                           Systemic Diam:  2.00 cm Riley Lam MD Electronically signed by Riley Lam MD Signature Date/Time: 08/01/2020/1:52:37 PM    Final     PHYSICAL EXAM: limited by patient condition at time of exam. Niece at bedside.    Mental status/Cognition: Alert, oriented to self and to place. Patient is slow to answer questions, and seems to defer to niece. Paucity of words noted. Mild right sided facial droop noted and Right side of face with bruising seen in various stages of healing.  Speech/language: minimal interaction. Answers yes rarely but not sure if patient understands all commands. C Unable to repeat. Cranial nerves:   CN II Pupils equal and reactive to light, no VF deficits   CN III,IV,VI EOM intact, no gaze preference or deviation, no nystagmus   CN V normal sensation in V1, V2, and V3 segments bilaterally   CN VII R facial droop   CN VIII normal hearing to speech   CN IX & X normal palatal elevation, no uvular deviation   CN XI 5/5 head turn and 5/5 shoulder shrug bilaterally   CN XII midline tongue protrusion   Motor:  Muscle bulk: poor, left arm is 4/5 and right arm 3-/5/ left leg is 3+/5 and right leg is 3-/5. Unable to assess heel/shin, but left arm finger to nose is intact.   ASSESSMENT/PLAN   Kenlynn Ellen Barajas is a 87 y.o. female with PMH significant for CAD, permanent Afibb on Xarelto, HLD, MI, post herpetic neuralgia who is admitted with fall and confusion. She was found to have elevated troponins and NSTEMI. During hospitalization she developed right facial  droop, right hemiparesis and expressive for which code stroke was activated.     Stroke TIA:  left ACA branch infarct embolic secondary to  small vessel disease Etiology for patient stroke likely due to non-STEMI with low EF at 25 to 30%, down from 55 to 60% in 09/2019.  Patient likely had non-STEMI prior to the fall, not sure whether patient lost consciousness before falling.  Patient does have history of A. fib on Xarelto, therefore can not rule out A. fib as potential stroke etiology but less likely in this context.    Code Stroke  CT head No acute abnormality.  Small vessel disease. Atrophy.  ASPECTS 10.      CT perfusion  : No core infarct or cerebral ischemia by standard perfusion thresholds. MRI brain without contrast:   1. Moderate to large acute left ACA branch infarct. 2. Tiny acute infarct in the left anterior temporal cortex. 3. Atrophy and extensive chronic small vessel ischemia.  CTA head ane neck:  1. Negative for large vessel occlusion. 2. Advanced intracranial atherosclerosis. Most notable stenoses are at the right M1 and left M2 segments. 3. 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 4. 2 mm A-comm aneurysm.    2D Echo pending   LDL 140 HgbA1c No results found for requested labs within last 16109 hours. VTE prophylaxis -      Diet   Diet regular Room service appropriate? Yes; Fluid consistency: Thin   Xarelto (rivaroxaban) daily prior to admission, now on Xarelto (rivaroxaban) daily. Plan: Hold xarelto for four days starting today given size of infarct to minimize risk of hemorrhagic conversion. Start aspirin  today. Cardiology has added plavix with loading dose starting today.   Therapy recommendations:  pending Disposition:  pending Started on lipitor  yesterday.   Hypertension Home  meds:  tenormin 25mg  daily, losartan 25mg  daily     Stable Permissive hypertension (OK if < 220/120) but gradually normalize in 5-7 days Long-term BP goal normotensive  Hyperlipidemia Home meds:   none LDL 140, goal < 70   High intensity statin started today-lipitor 40mg  daily.    CBGs Recent Labs    08/01/20 0417  GLUCAP 121*     Other Stroke Risk Factors  Advanced Age >/= 65   Coronary artery disease   Other Active Problems NSTEMI: no active chest pain and EKG with no significant changes. Continue beta blockade.  Persistent atrial fibrillation: rate controlled. Continue with atenolol.  Dyslipidemia: not on statin and unlikely to receive any meaningful benefit from initiation at this point.   Hospital day # 2     To contact Stroke Continuity provider, please refer to . After hours, contact General Neurology

## 2020-08-03 DIAGNOSIS — I10 Essential (primary) hypertension: Secondary | ICD-10-CM | POA: Diagnosis not present

## 2020-08-03 DIAGNOSIS — I4811 Longstanding persistent atrial fibrillation: Secondary | ICD-10-CM

## 2020-08-03 DIAGNOSIS — I63422 Cerebral infarction due to embolism of left anterior cerebral artery: Secondary | ICD-10-CM | POA: Diagnosis not present

## 2020-08-03 DIAGNOSIS — E785 Hyperlipidemia, unspecified: Secondary | ICD-10-CM | POA: Diagnosis not present

## 2020-08-03 DIAGNOSIS — R55 Syncope and collapse: Secondary | ICD-10-CM

## 2020-08-03 DIAGNOSIS — I214 Non-ST elevation (NSTEMI) myocardial infarction: Secondary | ICD-10-CM | POA: Diagnosis not present

## 2020-08-03 LAB — CBC
HCT: 39.9 % (ref 36.0–46.0)
Hemoglobin: 12.7 g/dL (ref 12.0–15.0)
MCH: 28.7 pg (ref 26.0–34.0)
MCHC: 31.8 g/dL (ref 30.0–36.0)
MCV: 90.1 fL (ref 80.0–100.0)
Platelets: 196 10*3/uL (ref 150–400)
RBC: 4.43 MIL/uL (ref 3.87–5.11)
RDW: 14 % (ref 11.5–15.5)
WBC: 6.8 10*3/uL (ref 4.0–10.5)
nRBC: 0 % (ref 0.0–0.2)

## 2020-08-03 LAB — COMPREHENSIVE METABOLIC PANEL
ALT: 17 U/L (ref 0–44)
AST: 22 U/L (ref 15–41)
Albumin: 2.9 g/dL — ABNORMAL LOW (ref 3.5–5.0)
Alkaline Phosphatase: 57 U/L (ref 38–126)
Anion gap: 7 (ref 5–15)
BUN: 17 mg/dL (ref 8–23)
CO2: 29 mmol/L (ref 22–32)
Calcium: 8.6 mg/dL — ABNORMAL LOW (ref 8.9–10.3)
Chloride: 102 mmol/L (ref 98–111)
Creatinine, Ser: 0.9 mg/dL (ref 0.44–1.00)
GFR, Estimated: >60 mL/min (ref >60)
Glucose, Bld: 101 mg/dL — ABNORMAL HIGH (ref 70–99)
Potassium: 3.5 mmol/L (ref 3.5–5.1)
Sodium: 138 mmol/L (ref 135–145)
Total Bilirubin: 1.3 mg/dL — ABNORMAL HIGH (ref 0.3–1.2)
Total Protein: 5.3 g/dL — ABNORMAL LOW (ref 6.5–8.1)

## 2020-08-03 NOTE — Progress Notes (Signed)
Physical Therapy Treatment Patient Details Name: Ellen Barajas MRN: [id number] DOB: [birth year] Today's Date: 08/03/2020    History of Present Illness Pt is an 87 y/o female who presented on 7/8 after being found down for unknown time. Elevated troponin and NSTEMI in ED. In ED, RN found patient to have R facial droop, R arm and leg weakness, and expressive aphasia. 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 MRI confirms moderate to large acute L ACA infarct as well as a tiny acute infarct in the L anterior temporal cortex. PMH: Afib on coumadin, CAD s/p CABG, HLD, MI 2022, diverticulosis    PT Comments    Pt progressing towards physical therapy goals. Pt was able to demonstrate improved strength in the RLE, however continues to have difficulty advancing RLE during gait training. With assist for shoulder flexion, pt able to hold and eccentrically lower. Niece, [name], present during session and helpful to provide +2 assist for safety. Pt was able to take pivotal steps around to the chair with up to +2 mod assist. Continue to feel this pt would benefit from the increased intensity of a CIR admission to maximize functional independence and safety prior to return home with family support.    Follow Up Recommendations  CIR     Equipment Recommendations  Other (comment) (TBD by next venue of care)    Recommendations for Other Services Rehab consult     Precautions / Restrictions Precautions Precautions: Fall Precaution Comments: expressive aphasia, very hard of hearing Restrictions Weight Bearing Restrictions: No    Mobility  Bed Mobility Overal bed mobility: Needs Assistance Bed Mobility: Rolling;Sidelying to Sit Rolling: Min assist Sidelying to sit: Min assist       General bed mobility comments: Pt on bedpan when PT arrived. Rolling R for peri-care after bedpan. Rolling L to prepare for sidelying to sit. Heavy min assist required to complete transition to sitting, however good initiation with  strong L UE.    Transfers Overall transfer level: Needs assistance Equipment used: 1 person hand held assist;2 person hand held assist Transfers: Sit to/from [employer] Sit to Stand: Min assist;+2 safety/equipment;Mod assist Stand pivot transfers: Min assist;+2 safety/equipment;Mod assist       General transfer comment: +2 assist mainly for safety as pt powered up to full stand and took pivotal steps around to the chair. PT facilitated weight shift to L in order to off-weight the RLE to advance. Therapist assisting to advance RLE as pt fatigued.  Ambulation/Gait             General Gait Details: Pre-gait activity at EOB. Pt able to weight shift but unable to raise RLE to march in place.   Stairs             Wheelchair Mobility    Modified Rankin (Stroke Patients Only) Modified Rankin (Stroke Patients Only) Pre-Morbid Rankin Score: Slight disability Modified Rankin: Severe disability     Balance Overall balance assessment: Needs assistance Sitting-balance support: No upper extremity supported;Feet supported Sitting balance-Leahy Scale: Good     Standing balance support: Bilateral upper extremity supported;During functional activity Standing balance-Leahy Scale: Poor                              Cognition Arousal/Alertness: Awake/alert Behavior During Therapy: WFL for tasks assessed/performed Overall Cognitive Status: History of cognitive impairments - at baseline Area of Impairment: Following commands  Following Commands: Follows one step commands consistently;Follows one step commands with increased time       General Comments: Niece states decreased ST memory, but following commands with increased time.  Answers questions better when given choices.      Exercises General Exercises - Upper Extremity Shoulder Flexion: 5 reps (PROM to elevate, hold with eccentric lower by pt) Elbow Flexion: 5  reps (PROM to elevate, hold with eccentric lower by pt) General Exercises - Lower Extremity Long Arc Quad: 10 reps;Right;AROM    General Comments General comments (skin integrity, edema, etc.): On 2L/min supplemental O2 when PT arrived. On RA throughout session with O2 sats at 95% at end of session.      Pertinent Vitals/Pain Pain Assessment: No/denies pain    Home Living Family/patient expects to be discharged to:: Inpatient rehab               Additional Comments: lives alone at baseline with family check-ins from niece    Prior Function Level of Independence: Independent with assistive device(s)      Comments: uses quad cane for ambulation, does not drive. Neice brings grocery.  Patient continued to be independent for ADL completion, med management, meal prep and home management.  Niece assists with bill payment and community mobility.   PT Goals (current goals can now be found in the care plan section) Acute Rehab PT Goals Patient Stated Goal: Regain independence per Niece PT Goal Formulation: With patient/family Time For Goal Achievement: 08/15/20 Potential to Achieve Goals: Good Progress towards PT goals: Progressing toward goals    Frequency    Min 4X/week      PT Plan Current plan remains appropriate    Co-evaluation              AM-PAC PT "6 Clicks" Mobility   Outcome Measure  Help needed turning from your back to your side while in a flat bed without using bedrails?: A Little Help needed moving from lying on your back to sitting on the side of a flat bed without using bedrails?: A Little Help needed moving to and from a bed to a chair (including a wheelchair)?: A Lot Help needed standing up from a chair using your arms (e.g., wheelchair or bedside chair)?: A Lot Help needed to walk in hospital room?: Total Help needed climbing 3-5 steps with a railing? : Total 6 Click Score: 12    End of Session Equipment Utilized During Treatment: Gait  belt;Oxygen Activity Tolerance: Patient tolerated treatment well Patient left: in chair;with call bell/phone within reach;with chair alarm set;with family/visitor present (Niece, Psychologist, occupational) Nurse Communication: Mobility status;Other (comment) (O2 status) PT Visit Diagnosis: Unsteadiness on feet (R26.81);Muscle weakness (generalized) (M62.81);History of falling (Z91.81);Difficulty in walking, not elsewhere classified (R26.2);Other abnormalities of gait and mobility (R26.89);Hemiplegia and hemiparesis Hemiplegia - Right/Left: Right Hemiplegia - caused by: Cerebral infarction     Time: 2119-4174 PT Time Calculation (min) (ACUTE ONLY): 34 min  Charges:  $Gait Training: 23-37 mins                     Conni Slipper, PT, DPT Acute Rehabilitation Services Pager: [phone] Office: [phone]    Marylynn Pearson 08/03/2020, 10:39 AM

## 2020-08-03 NOTE — Evaluation (Signed)
Speech Language Pathology Evaluation Patient Details Name: Ellen Barajas MRN: [id number] DOB: 09/29/1928 Today's Date: 08/03/2020 Time: 3382-5053 SLP Time Calculation (min) (ACUTE ONLY): 31 min  Problem List:  Patient Active Problem List   Diagnosis Date Noted   Cerebral embolism with cerebral infarction 08/01/2020   NSTEMI (non-ST elevated myocardial infarction) (HCC) 07/31/2020   Multiple facial fractures, closed, initial encounter (HCC) 07/31/2020   Syncope and collapse 07/31/2020   Bilateral intertrochanteric hip fractures (HCC)    Femur fracture, right (HCC) 11/21/2018   AF (paroxysmal atrial fibrillation) (HCC) 11/21/2018   Hip fracture (HCC) 09/27/2017   Health maintenance examination 09/21/2016   Essential hypertension 04/11/2016   Medicare annual wellness visit, subsequent 10/16/2014   Advanced care planning/counseling discussion 10/16/2014   DNR (do not resuscitate) 10/16/2014   Osteoarthritis 04/19/2013   Hx of CABG 12/17/2012   Post herpetic neuralgia 02/25/2012   IHD (ischemic heart disease)    Dyslipidemia    CAD (coronary artery disease)    Longstanding persistent atrial fibrillation (HCC) 04/19/2010   Past Medical History:  Past Medical History:  Diagnosis Date   AF (atrial fibrillation) (HCC)    coumadin   Arthritis    "hands" (09/27/2017)   Coronary artery disease 2002   s/p CABG   Diverticulosis    Dyslipidemia    History of chicken pox    IHD (ischemic heart disease)    Myocardial infarction (HCC) ~ 2002   Post herpetic neuralgia 02/2012   Past Surgical History:  Past Surgical History:  Procedure Laterality Date   BREAST LUMPECTOMY Left 1992   excision of fibrous tumor/notes 06/19/2000   CARDIAC CATHETERIZATION  06/08/2000   CATARACT EXTRACTION W/ INTRAOCULAR LENS  IMPLANT, BILATERAL Bilateral 2013   CORONARY ARTERY BYPASS GRAFT  2002   x 4   EYE SURGERY Bilateral 1989   FEMUR IM NAIL Left 09/28/2017   Procedure: INTRAMEDULLARY (IM) NAIL FEMORAL;   Surgeon: Sheral Apley, MD;  Location: [facility] OR;  Service: Orthopedics;  Laterality: Left;   INTRAMEDULLARY (IM) NAIL INTERTROCHANTERIC Right 11/22/2018   Procedure: INTRAMEDULLARY (IM) NAIL INTERTROCHANTRIC;  Surgeon: Sheral Apley, MD;  Location: [facility] OR;  Service: Orthopedics;  Laterality: Right;   HPI:  87 year old female admitted s/p fall with confusion. Dx with NSTEMI on 7/8. On 7/9, patient with acute onset aphasia, right sided weakness and facial droop. Head CT with Moderate acute infarct in the parasagittal left frontal lobe, ACA territory.   Assessment / Plan / Recommendation Clinical Impression  Pt presents with expressive and receptive aphasia marked by occasional spontaneous 2-3 word phrase utterances, halting speech with word finding difficulties and impairment in comprehension of moderate- complex comprehension tasks. Pt able to engage in majority of  automatic speech tasks without difficulty, however verbal responses to open ended biographical questions were significant for word finding and paraphasic speech. She named items in her immediate environment with 60% accuracy. Performance on all expressive language tasks improved with a variety of cues (sentence completion, orthographic, semantic and phonemic). She is able to point to various items and body parts accurately, follow simple 2 -step commands and answer simple- moderate level y/n questions. Automatic speech was clear, without indication or concerns for dysarthria. SLP services to initiate treatment for expressive/receptive language. Will continue dysphagia treatment.    SLP Assessment  SLP Recommendation/Assessment: Patient needs continued Speech Lanaguage Pathology Services SLP Visit Diagnosis: Aphasia (R47.01)    Follow Up Recommendations  Inpatient Rehab    Frequency and Duration min  2x/week  2 weeks      SLP Evaluation Cognition  Overall Cognitive Status: Impaired/Different from baseline Arousal/Alertness:  Awake/alert Orientation Level: Oriented to person;Disoriented to place;Disoriented to time;Disoriented to situation       Comprehension  Auditory Comprehension Overall Auditory Comprehension: Impaired Yes/No Questions: Impaired Basic Biographical Questions: 76-100% accurate Basic Immediate Environment Questions: 75-100% accurate Complex Questions: 25-49% accurate Commands: Impaired One Step Basic Commands: 75-100% accurate Two Step Basic Commands: 75-100% accurate Interfering Components: Hearing EffectiveTechniques: Extra processing time;Repetition Visual Recognition/Discrimination Discrimination: Not tested Reading Comprehension Reading Status: Not tested    Expression Expression Primary Mode of Expression: Verbal Verbal Expression Overall Verbal Expression: Impaired Initiation: Impaired Automatic Speech: Name;Social Response;Counting;Singing Level of Generative/Spontaneous Verbalization: Phrase Naming: Impairment Confrontation: Impaired Verbal Errors: Semantic paraphasias;Phonemic paraphasias Effective Techniques: Semantic cues;Sentence completion;Phonemic cues (orthographic) Non-Verbal Means of Communication: Gestures Written Expression Dominant Hand: Right Written Expression: Exceptions to [facility] Copy Ability:  (unable)   Oral / Motor  Oral Motor/Sensory Function Overall Oral Motor/Sensory Function: Mild impairment Facial ROM: Reduced right;Suspected CN VII (facial) dysfunction Facial Symmetry: Abnormal symmetry right;Suspected CN VII (facial) dysfunction Motor Speech Overall Motor Speech: Appears within functional limits for tasks assessed   GO           Avie Echevaria, MA, CCC-SLP Acute Rehabilitation Services Office Number: [phone]          [name] 08/03/2020, 3:59 PM

## 2020-08-03 NOTE — Progress Notes (Signed)
Inpatient Rehabilitation Admissions Coordinator   Inpatient rehab consult received. I met with patient at bedside with her niece. I discussed goals and expectations of a possible CIR admit pending 24/7 caregiver support could be arranged after CIR admit. Patient lived alone pta. Niece will begin assessing if that can be arranged .Otherwise she may require SNF. I explained that Insurance will not approve both CIR and then  SNF after a CIR admit.Patient not expected to be able to reach independent level to go home without caregiver support.  I will follow up tomorrow.  Danne Baxter, RN, MSN Rehab Admissions Coordinator [phone] 08/03/2020 12:06 PM

## 2020-08-03 NOTE — Progress Notes (Addendum)
PROGRESS NOTE        PATIENT DETAILS Name: Ellen Barajas Age: 87 y.o. Sex: female Date of Birth: 24-Feb-1928 Admit Date: 07/31/2020 Admitting Physician Orland Mustard, MD PCP:No primary care provider on file.  Brief Narrative: Patient is a 87 y.o. female with history of A. fib on Xarelto, CAD s/p CABG, HTN, HLD-who was found at her house by her niece-confused-s/p fall-she was subsequently brought to the ED-where she was found to have non-STEMI, and acute CVA.  See below for further details  Significant events: 7/8>> found by niece-fall-confused-found to have non-STEMI/acute CVA.  Significant studies: 7/8>> CT head: No acute intracranial findings 7/8>> CT C-spine: No fracture/subluxation 7/8>> CT maxillofacial area: Mildly displaced fracture of right zygomatic arch, anterior/posterior lateral walls of the right maxillary sinus 7/8>> CXR: No pneumonia 7/9>> CTA head: Advanced intracranial stenosis-most notable in right M1 and left M2 segments, 2 mm A-comm aneurysm 7/9>> CTA neck: No flow limiting stenosis or ulceration in carotid arteries, occluded left vertebral artery 7/9>> MRI brain: Moderate to large acute left ACA infarct 7/9>> TTE: EF 25-30%, RVSP 71.6.  Left ventricle-anterior, anterolateral and posterior wall are hypokinetic 7/9>>LDL:140 7/10>> A1c: 6.0  Antimicrobial therapy: None  Microbiology data: 7/8>> COVID/influenza PCR: Negative  Procedures : None  Consults: Neurology, cardiology  DVT Prophylaxis : enoxaparin (LOVENOX) injection 40 mg Start: 08/01/20 2000Prophylactic Lovenox   Subjective: No major issues overnight-lying comfortably in bed.   Assessment/Plan: Non-STEMI: No further chest pain-lying comfortably in bed-after extensive discussion with family-plan is to manage conservatively.  Currently on aspirin/Plavix/statin.  Cardiology following.  Acute CVA: Suspicion for embolic CVA in the setting of non-STEMI and new LV  dysfunction.  Anticoagulation on hold-stable on aspirin.  Await rehab services follow-up.  Continues to have significant expressive aphasia-and right-sided hemiparesis.   Newly diagnosed systolic heart failure: Suspect ischemic cardiomyopathy in the setting of non-STEMI.  Volume status is stable-cardiology following-on low-dose Lasix, metoprolol and losartan.  Persistent atrial fibrillation: Rate controlled-discussed with neurology-hold Xarelto for a few days given size of CVA.  On aspirin/Plavix-Per neurology-plan is to restart Xarelto when risk of hemorrhagic transformation is low.  History of CAD s/p CABG  HTN: BP relatively stable-on losartan-allow some permissive hypertension.  Multiple facial fractures: Admitting MD spoke with ENT-recommendations were for supportive care-empiric Keflex to prevent sinusitis (blood in maxillary sinus with pulling).  Mildly elevated CK: Probably either mild rhabdomyolysis or a sequelae of non-STEMI.  Palliative care: DNR in place-discussed with niece at bedside-no heroics-okay with gentle medical treatment  Diet: Diet Order             Diet regular Room service appropriate? Yes; Fluid consistency: Thin  Diet effective now                    Code Status: Full code   Family Communication: [name] Chambers-niece-[phone]-at bedside  Disposition Plan: Status is: Inpatient  Remains inpatient appropriate because:Inpatient level of care appropriate due to severity of illness  Dispo: The patient is from: Home              Anticipated d/c is to: CIR              Patient currently is not medically stable to d/c.   Difficult to place patient No    Barriers to Discharge: Acute CVA-dysarthria-right-sided deficits-dysphagia-non-STEMI-work-up in progress-not stable for discharge.  Antimicrobial agents: Anti-infectives (From admission, onward)    Start     Dose/Rate Route Frequency Ordered Stop   07/31/20 1745  cephALEXin (KEFLEX) capsule 500  mg        500 mg Oral Every 8 hours 07/31/20 1736 08/07/20 1359        Time spent: 25 minutes-Greater than 50% of this time was spent in counseling, explanation of diagnosis, planning of further management, and coordination of care.  MEDICATIONS: Scheduled Meds:  aspirin EC  81 mg Oral Daily   atorvastatin  40 mg Oral Daily   cephALEXin  500 mg Oral Q8H   clopidogrel  75 mg Oral Daily   enoxaparin (LOVENOX) injection  40 mg Subcutaneous Q24H   furosemide  20 mg Oral Daily   losartan  25 mg Oral Daily   metoprolol tartrate  12.5 mg Oral BID   Continuous Infusions:   PRN Meds:.   PHYSICAL EXAM: Vital signs: Vitals:   08/02/20 2005 08/02/20 2350 08/03/20 0456 08/03/20 0826  BP: 134/86 121/60 (!) 150/91 (!) 148/95  Pulse: (!) 110 91 96 85  Resp: Temp: (!) 97.5 F (36.4 C) 98.1 F (36.7 C) 98.1 F (36.7 C) 98.5 F (36.9 C)  TempSrc: Oral Oral Oral Oral  SpO2: 97% 98% 100% 99%  Weight:      Height:       Filed Weights   07/31/20 1244 08/01/20 0644  Weight: 71.7 kg 68.4 kg   Body mass index is 26.71 kg/m.   Gen Exam:Alert awake-not in any distress HEENT:atraumatic, normocephalic Chest: B/L clear to auscultation anteriorly CVS:S1S2 regular Abdomen:soft non tender, non distended Extremities:no edema Neurology: Continues to have unchanged right-sided hemiparesis.  Expressive aphasia continues. Skin: no rash   I have personally reviewed following labs and imaging studies  LABORATORY DATA: CBC: Recent Labs  Lab 07/31/20 1304 07/31/20 1309 08/01/20 0015 08/02/20 0246 08/03/20 0415  WBC 9.7  --  9.9 7.9 6.8  NEUTROABS 8.4*  --   --   --   --   HGB 13.8 14.6 13.4 13.2 12.7  HCT 43.6 43.0 40.3 40.5 39.9  MCV 90.8  --  88.2 90.4 90.1  PLT 243  --  207 200 196     Basic Metabolic Panel: Recent Labs  Lab 07/31/20 1304 07/31/20 1309 08/01/20 0015 08/02/20 0246 08/03/20 0415  NA 137 138 134* 137 138  K 4.5 4.4 4.2 3.9 3.5  CL 103  --   105 104 102  CO2 23  --  GLUCOSE 123*  --  145* 95 101*  BUN 21  --  CREATININE 1.05*  --  0.88 0.84 0.90  CALCIUM 9.2  --  8.9 8.9 8.6*  MG 2.2  --   --   --   --   PHOS 4.5  --   --   --   --      GFR: Estimated Creatinine Clearance: 39.3 mL/min (by C-G formula based on SCr of 0.9 mg/dL).  Liver Function Tests: Recent Labs  Lab 07/31/20 1304 08/01/20 0015 08/02/20 0246 08/03/20 0415  AST 50* 40 27 22  ALT ALKPHOS 70 61 56 57  BILITOT 1.6* 1.5* 1.3* 1.3*  PROT 6.8 5.6* 5.6* 5.3*  ALBUMIN 3.9 3.2* 3.2* 2.9*    Recent Labs  Lab 07/31/20 1304  LIPASE 24    No results for input(s): AMMONIA in the last  168 hours.  Coagulation Profile: Recent Labs  Lab 07/31/20 1304  INR 1.1     Cardiac Enzymes: Recent Labs  Lab 07/31/20 1304 08/01/20 0015  CKTOTAL 731* 448*     BNP (last 3 results) No results for input(s): PROBNP in the last 8760 hours.  Lipid Profile: Recent Labs    08/01/20 0015 08/02/20 0246  CHOL 209* 199  HDL 56 53  LDLCALC 140* 130*  TRIG 67 79  CHOLHDL 3.7 3.8     Thyroid Function Tests: No results for input(s): TSH, T4TOTAL, FREET4, T3FREE, THYROIDAB in the last 72 hours.  Anemia Panel: No results for input(s): VITAMINB12, FOLATE, FERRITIN, TIBC, IRON, RETICCTPCT in the last 72 hours.  Urine analysis: No results found for: COLORURINE, APPEARANCEUR, LABSPEC, PHURINE, GLUCOSEU, HGBUR, BILIRUBINUR, KETONESUR, PROTEINUR, UROBILINOGEN, NITRITE, LEUKOCYTESUR  Sepsis Labs: Lactic Acid, Venous    Component Value Date/Time   LATICACIDVEN 2.2 (HH) 07/31/2020 2023    MICROBIOLOGY: Recent Results (from the past 240 hour(s))  Resp Panel by RT-PCR (Flu A&B, Covid) Nasopharyngeal Swab     Status: None   Collection Time: 07/31/20  1:26 PM   Specimen: Nasopharyngeal Swab; Nasopharyngeal(NP) swabs in vial transport medium  Result Value Ref Range Status   SARS Coronavirus 2 by RT PCR NEGATIVE NEGATIVE Final     Comment: (NOTE) SARS-CoV-2 target nucleic acids are NOT DETECTED.  The SARS-CoV-2 RNA is generally detectable in upper respiratory specimens during the acute phase of infection. The lowest concentration of SARS-CoV-2 viral copies this assay can detect is 138 copies/mL. A negative result does not preclude SARS-Cov-2 infection and should not be used as the sole basis for treatment or other patient management decisions. A negative result may occur with  improper specimen collection/handling, submission of specimen other than nasopharyngeal swab, presence of viral mutation(s) within the areas targeted by this assay, and inadequate number of viral copies(<138 copies/mL). A negative result must be combined with clinical observations, patient history, and epidemiological information. The expected result is Negative.  Fact Sheet for Patients:  BloggerCourse.com  Fact Sheet for Healthcare Providers:  SeriousBroker.it  This test is no t yet approved or cleared by the [place] FDA and  has been authorized for detection and/or diagnosis of SARS-CoV-2 by FDA under an Emergency Use Authorization (EUA). This EUA will remain  in effect (meaning this test can be used) for the duration of the COVID-19 declaration under Section 564(b)(1) of the Act, 21 U.S.C.section 360bbb-3(b)(1), unless the authorization is terminated  or revoked sooner.       Influenza A by PCR NEGATIVE NEGATIVE Final   Influenza B by PCR NEGATIVE NEGATIVE Final    Comment: (NOTE) The Xpert Xpress SARS-CoV-2/FLU/RSV plus assay is intended as an aid in the diagnosis of influenza from Nasopharyngeal swab specimens and should not be used as a sole basis for treatment. Nasal washings and aspirates are unacceptable for Xpert Xpress SARS-CoV-2/FLU/RSV testing.  Fact Sheet for Patients: BloggerCourse.com  Fact Sheet for Healthcare  Providers: SeriousBroker.it  This test is not yet approved or cleared by the [place] FDA and has been authorized for detection and/or diagnosis of SARS-CoV-2 by FDA under an Emergency Use Authorization (EUA). This EUA will remain in effect (meaning this test can be used) for the duration of the COVID-19 declaration under Section 564(b)(1) of the Act, 21 U.S.C. section 360bbb-3(b)(1), unless the authorization is terminated or revoked.  Performed at [facility], [address] [place], [place] [address]  RADIOLOGY STUDIES/RESULTS: ECHOCARDIOGRAM COMPLETE  Result Date: 08/01/2020    ECHOCARDIOGRAM REPORT   Patient Name:   Ellen Barajas Date of Exam: 08/01/2020 Medical Rec #:  [id number]    Height:       63.0 in Accession #:    [id number]   Weight:       150.8 lb Date of Birth:  May 14, 1928     BSA:          1.715 m Patient Age:    89 years     BP:           151/90 mmHg Patient Gender: F            HR:           88 bpm. Exam Location:  Inpatient Procedure: 2D Echo, Cardiac Doppler and Color Doppler Indications:    NSTEMI I21.4  History:        Patient has prior history of Echocardiogram examinations, most                 recent 10/14/2019. CAD and Previous Myocardial Infarction, Prior                 CABG, Arrythmias:Atrial Fibrillation; Risk Factors:Dyslipidemia.  Sonographer:    Renella Cunas RDCS Referring Phys: [id number] ALLISON WOLFE IMPRESSIONS  1. Left ventricular ejection fraction, by estimation, is 25 to 30%. The left ventricle has severely decreased function. The left ventricle demonstrates regional wall motion abnormalities (see scoring diagram/findings for description). The left ventricular internal cavity size was mildly dilated. Left ventricular diastolic parameters are indeterminate.  2. Right ventricular systolic function is mildly reduced. The right ventricular size is normal. There is severely elevated pulmonary artery systolic pressure. The estimated  right ventricular systolic pressure is 71.6 mmHg.  3. Left atrial size was mildly dilated.  4. The mitral valve is abnormal. Mild to moderate mitral valve regurgitation. The mean mitral valve gradient is 2.4 mmHg with average heart rate of 91 bpm. Severe mitral annular calcification.  5. Tricuspid valve regurgitation is mild to moderate.  6. The inferior vena cava is dilated in size with <50% respiratory variability, suggesting right atrial pressure of 15 mmHg.  7. The aortic valve is tricuspid. Aortic valve regurgitation is mild to moderate. Aortic regurgitation PHT measures 450 msec. Aortic valve mean gradient measures 11.0 mmHg. Low Flow Low Gradient Aortic Stenosis suspected, in the setting of diminished stroke volume index and valve morphology. Comparison(s): A prior study was performed on 10/14/2019. Prior images reviewed side by side. Significant changes in LVEF have occurred. FINDINGS  Left Ventricle: Left ventricular ejection fraction, by estimation, is 25 to 30%. The left ventricle has severely decreased function. The left ventricle demonstrates regional wall motion abnormalities. The left ventricular internal cavity size was mildly  dilated. There is no left ventricular hypertrophy. Left ventricular diastolic parameters are indeterminate.  LV Wall Scoring: The anterior wall, antero-lateral wall, and posterior wall are hypokinetic. Right Ventricle: The right ventricular size is normal. No increase in right ventricular wall thickness. Right ventricular systolic function is mildly reduced. There is severely elevated pulmonary artery systolic pressure. The tricuspid regurgitant velocity is 3.76 m/s, and with an assumed right atrial pressure of 15 mmHg, the estimated right ventricular systolic pressure is 71.6 mmHg. Left Atrium: Left atrial size was mildly dilated. Right Atrium: Right atrial size was normal in size. Pericardium: Trivial pericardial effusion is present. Mitral Valve: The mitral valve is  abnormal. Severe mitral annular calcification. Mild to moderate  mitral valve regurgitation. The mean mitral valve gradient is 2.4 mmHg with average heart rate of 91 bpm. Tricuspid Valve: The tricuspid valve is grossly normal. Tricuspid valve regurgitation is mild to moderate. Aortic Valve: The aortic valve is tricuspid. Aortic valve regurgitation is mild to moderate. Aortic regurgitation PHT measures 450 msec. Aortic valve mean gradient measures 11.0 mmHg. Aortic valve peak gradient measures 20.2 mmHg. Aortic valve area, by VTI measures 0.88 cm. Pulmonic Valve: The pulmonic valve was not well visualized. Pulmonic valve regurgitation is mild. No evidence of pulmonic stenosis. Aorta: The aortic root is normal in size and structure. Venous: The inferior vena cava is dilated in size with less than 50% respiratory variability, suggesting right atrial pressure of 15 mmHg. IAS/Shunts: The atrial septum is grossly normal.  LEFT VENTRICLE PLAX 2D LVIDd:         5.70 cm LVIDs:         4.90 cm LV PW:         0.80 cm LV IVS:        0.80 cm LVOT diam:     2.00 cm LV SV:         41 LV SV Index:   24 LVOT Area:     3.14 cm  LV Volumes (MOD) LV vol d, MOD A2C: 128.0 ml LV vol d, MOD A4C: 123.0 ml LV vol s, MOD A2C: 95.6 ml LV vol s, MOD A4C: 85.6 ml LV SV MOD A2C:     32.4 ml LV SV MOD A4C:     123.0 ml LV SV MOD BP:      33.2 ml RIGHT VENTRICLE RV S prime:     6.08 cm/s TAPSE (M-mode): 1.1 cm LEFT ATRIUM             Index       RIGHT ATRIUM           Index LA diam:        3.90 cm 2.27 cm/m  RA Area:     16.50 cm LA Vol (A2C):   52.0 ml 30.32 ml/m RA Volume:   41.50 ml  24.20 ml/m LA Vol (A4C):   52.6 ml 30.67 ml/m LA Biplane Vol: 57.0 ml 33.24 ml/m  AORTIC VALVE AV Area (Vmax):    0.95 cm AV Area (Vmean):   0.89 cm AV Area (VTI):     0.88 cm AV Vmax:           225.00 cm/s AV Vmean:          151.500 cm/s AV VTI:            0.464 m AV Peak Grad:      20.2 mmHg AV Mean Grad:      11.0 mmHg LVOT Vmax:         67.80 cm/s  LVOT Vmean:        42.900 cm/s LVOT VTI:          0.130 m LVOT/AV VTI ratio: 0.28 AI PHT:            450 msec  AORTA Ao Root diam: 3.30 cm Ao Asc diam:  3.30 cm MITRAL VALVE              TRICUSPID VALVE MV Mean grad: 2.4 mmHg    TR Peak grad:   56.6 mmHg MR Peak grad: 122.3 mmHg  TR Vmax:        376.00 cm/s MR Mean grad: 76.0 mmHg MR Vmax:  553.00 cm/s SHUNTS MR Vmean:     408.0 cm/s  Systemic VTI:  0.13 m                           Systemic Diam: 2.00 cm Riley Lam MD Electronically signed by Riley Lam MD Signature Date/Time: 08/01/2020/1:52:37 PM    Final      LOS: 3 days   Jeoffrey Massed, MD  [facility]    To contact the attending provider between 7A-7P or the covering provider during after hours 7P-7A, please log into the web site [email or web address] and access using universal [place] password for that web site. If you do not have the password, please call the hospital operator.  08/03/2020, 11:52 AM

## 2020-08-03 NOTE — Progress Notes (Signed)
STROKE TEAM PROGRESS NOTE   INTERVAL HISTORY Niece at bedside, patient sitting in chair, neuro stable, no headache, no complaints.  Currently on aspirin and Plavix.  If tomorrow neuro stable and no chest pain, resume Xarelto and continue Plavix but DC aspirin as per previously planned.  Niece is in agreement.  Vitals:   08/03/20 0826 08/03/20 1200 08/03/20 1650 08/03/20 1949  BP: (!) 148/95 (!) 102/56 125/77 (!) 113/98  Pulse: 85 98 94 (!) 112  Resp: 18 18 18 18   Temp: 98.5 F (36.9 C)  97.6 F (36.4 C) 98.6 F (37 C)  TempSrc: Oral  Oral Oral  SpO2: 99% 95% 92% 97%  Weight:      Height:       CBC:  Recent Labs  Lab 07/31/20 1304 07/31/20 1309 08/02/20 0246 08/03/20 0415  WBC 9.7   < > 7.9 6.8  NEUTROABS 8.4*  --   --   --   HGB 13.8   < > 13.2 12.7  HCT 43.6   < > 40.5 39.9  MCV 90.8   < > 90.4 90.1  PLT 243   < > 200 196   < > = values in this interval not displayed.   Basic Metabolic Panel:  Recent Labs  Lab 07/31/20 1304 07/31/20 1309 08/02/20 0246 08/03/20 0415  NA 137   < > 137 138  K 4.5   < > 3.9 3.5  CL 103   < > 104 102  CO2 23   < > 25 29  GLUCOSE 123*   < > 95 101*  BUN 21   < > 16 17  CREATININE 1.05*   < > 0.84 0.90  CALCIUM 9.2   < > 8.9 8.6*  MG 2.2  --   --   --   PHOS 4.5  --   --   --    < > = values in this interval not displayed.    Lipid Panel:  Recent Labs  Lab 08/02/20 0246  CHOL 199  TRIG 79  HDL 53  CHOLHDL 3.8  VLDL 16  LDLCALC 10/03/20*    HgbA1c:  Recent Labs  Lab 08/02/20 0246  HGBA1C 6.0*   Urine Drug Screen: No results for input(s): LABOPIA, COCAINSCRNUR, LABBENZ, AMPHETMU, THCU, LABBARB in the last 168 hours.  Alcohol Level No results for input(s): ETH in the last 168 hours.  IMAGING past 24 hours No results found.  PHYSICAL EXAM:  Head and face - right side of face and neck as well as periorbital area ecchymosis due to fall.    Neuro - Patient awake, alert, eyes open, good eye contact, however psychomotor  slowing, orientated to place and people, however not orientated to age or time.  Able to repeat and name 3/4 with mild dysarthria. No gaze palsy, tracking bilaterally, blinking to visual threat bilaterally.  Mild right facial droop. Tongue midline.  Left upper extremity at least 4/5, right upper extremity 3 -/5, right lower extremity 3-/5 proximal and 2/5 distal.  Left lower extremity 3+/5.  Sensation symmetrical bilaterally, left FTN intact, gait not tested.  ASSESSMENT/PLAN  Ellen Barajas is a 87 y.o. female with PMH significant for CAD, permanent Afibb on Xarelto, HLD, MI, post herpetic neuralgia who is admitted with fall and confusion. She was found to have elevated troponins and NSTEMI. During hospitalization she developed right facial droop, right hemiparesis and expressive for which code stroke was activated.    Stroke:  moderate sized left  MCA infarct  likely due to non-STEMI with low EF. Patient does have history of A. fib on Xarelto, therefore can not rule out A. fib as potential stroke etiology but less likely in this context.   CT head initial No acute abnormality. ASPECTS 10.     CT repeat showed left MCA moderate sized infarct.   CTA head and neck right M1, left M2 stenosis, and left V4 occlusion with distal retrograde flow.   CT perfusion negative.   MRI showed moderate sized left MCA infarct.   EF 25 to 30%, down from 55 to 60% in 09/2019.   LDL 140 HgbA1c 6.0 VTE prophylaxis -Lovenox Xarelto (rivaroxaban) daily prior to admission, now on aspirin and Plavix DAPT.  If neuro stable and no chest pain tomorrow, agree with cardiology to resume Xarelto and continue Plavix but DC aspirin. Therapy recommendations:  CIR Disposition:  pending  Non-STEMI History of CAD/MI Cardiology on board Likely the cause for patient fall/syncope EF 25 to 30% down from 55 to 60% in 09/2019 No intervention needed at this time given no CP Now on aspirin Plavix DAPT If neuro stable and no chest pain  tomorrow, agree with cardiology to resume Xarelto and continue Plavix but DC aspirin.  A. Fib On Xarelto PTA Currently on DAPT Plan to resume Xarelto in a.m. if neuro stable and no CP  Hypertension Home meds:  tenormin 25mg  daily, losartan 25mg  daily  Stable Long-term BP goal normotensive  Hyperlipidemia Home meds: None LDL 140, goal < 70  On lipitor 40mg  daily Continue statin on discharge   Other Stroke Risk Factors  Advanced Age >/= 85   Other Active Problems Fall with facial ecchymosis  Hospital day # 3  , MD PhD Stroke Neurology 08/03/2020 8:02 PM   To contact Stroke Continuity provider, please refer to 76. After hours, contact General Neurology

## 2020-08-03 NOTE — Plan of Care (Signed)
  Problem: Safety: Goal: Ability to remain free from injury will improve Outcome: Progressing   Problem: Skin Integrity: Goal: Risk for impaired skin integrity will decrease Outcome: Progressing   Problem: Activity: Goal: Ability to tolerate increased activity will improve Outcome: Progressing   Problem: Cardiac: Goal: Ability to achieve and maintain adequate cardiopulmonary perfusion will improve Outcome: Progressing   Problem: Health Behavior/Discharge Planning: Goal: Ability to safely manage health-related needs after discharge will improve Outcome: Progressing

## 2020-08-03 NOTE — Progress Notes (Signed)
After the assessment from respiratory therapist, BBS are clear, no SOB or wheezing at this time. Patient is on 2L of O2. Patient is resting in the chair and RT will continue to monitor as needed.

## 2020-08-03 NOTE — Progress Notes (Signed)
  Speech Language Pathology Treatment: Dysphagia  Patient Details Name: Ellen Barajas MRN: [id number] DOB: Feb 13, 1928 Today's Date: 08/03/2020 Time: 1450-1500 SLP Time Calculation (min) (ACUTE ONLY): 10 min  Assessment / Plan / Recommendation Clinical Impression  Pt alert and upright in bed with family member at bedside, reporting pt tolerating current diet. She mentions occasional oral residuals, but that they are cleared with cued liquid wash. Trials of regular textured solids and thin liquids via straw this date resulted in no overt s/sx of aspiration and minimal verbal cues for lingual sweep/liquid wash cleared mild oral residuals. Pt to continue on regular/thin liquid diet with SLP to f/u for tolerance and use of strategies to optimize safety with PO intake.    HPI HPI: 87 year old female admitted s/p fall with confusion. Dx with NSTEMI on 7/8. On 7/9, patient with acute onset aphasia, right sided weakness and facial droop. Head CT with Moderate acute infarct in the parasagittal left frontal lobe, ACA territory.      SLP Plan  Continue with current plan of care       Recommendations  Diet recommendations: Regular;Thin liquid Liquids provided via: Straw;Cup Medication Administration: Whole meds with puree Supervision: Staff to assist with self feeding Compensations: Slow rate;Small sips/bites Postural Changes and/or Swallow Maneuvers: Seated upright 90 degrees                Oral Care Recommendations: Oral care BID Follow up Recommendations: Inpatient Rehab SLP Visit Diagnosis: Dysphagia, unspecified (R13.10) Plan: Continue with current plan of care       GO              Ellen Echevaria, MA, CCC-SLP Acute Rehabilitation Services Office Number: [phone]   Ellen Barajas 08/03/2020, 3:37 PM

## 2020-08-03 NOTE — Progress Notes (Signed)
Occupational Therapy Treatment Patient Details Name: Ellen Barajas MRN: [id number] DOB: [birth year] Today's Date: 08/03/2020    History of present illness 87 y/o female who presented on 7/8 after being found down for unknown time. Elevated troponin and NSTEMI in ED. In ED, RN found patient to have R facial droop, R arm and leg weakness, and expressive aphasia. 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 MRI confirms moderate to large acute L ACA infarct as well as a tiny acute infarct in the L anterior temporal cortex. PMH: Afib on coumadin, CAD s/p CABG, HLD, MI 2022, diverticulosis   OT comments  Pt progressing towards established OT goals and is highly motivated to participate in therapy. Pt participating in several exercises for RUE including PROM/AAROM of R shoulder and elbow, table slides, and scapular retraction. Pt performing lateral leans onto R elbow for weight bearing and then pushing up into upright posture with Min A RUE. Pt enjoying singing hymns while sitting longer (~30 min) at EOB. VSS on RA; placed on 1L O2 at end of session. Discussed support options with niece and she reports she can gather support from friends and family to achieve 24/7 supervision. Continue to recommend dc to CIR and will continue to follow acutely as admitted.   Follow Up Recommendations  CIR    Equipment Recommendations  3 in 1 bedside commode;Tub/shower seat;Wheelchair (measurements OT);Wheelchair cushion (measurements OT)    Recommendations for Other Services Rehab consult    Precautions / Restrictions Precautions Precautions: Fall Precaution Comments: expressive aphasia, very hard of hearing       Mobility Bed Mobility Overal bed mobility: Needs Assistance Bed Mobility: Rolling;Sidelying to Sit;Sit to Sidelying Rolling: Min assist Sidelying to sit: Min assist     Sit to sidelying: Mod assist General bed mobility comments: Min A for elevating trunk. Mod A for managing BLEs in return to sidelying. Min  guard A for rolling    Transfers                 General transfer comment: Defered to focus on RUE coorindation    Balance Overall balance assessment: Needs assistance Sitting-balance support: No upper extremity supported;Feet supported Sitting balance-Leahy Scale: Fair Sitting balance - Comments: Tendency for lateral lean to R; reaching to hold onto bedrail tightly with L hand Postural control: Right lateral lean                                 ADL either performed or assessed with clinical judgement   ADL Overall ADL's : Needs assistance/impaired                                       General ADL Comments: Focused session on sitting balance and RUE exercises.     Vision       Perception     Praxis      Cognition Arousal/Alertness: Awake/alert Behavior During Therapy: WFL for tasks assessed/performed Overall Cognitive Status: Impaired/Different from baseline Area of Impairment: Following commands                       Following Commands: Follows one step commands consistently;Follows one step commands with increased time       General Comments: Pt following one step commands inconsistently; benefits from visual cues. Agreeable to exercises and sitting at  EOB; very motivated despite cognitive deficits.        Exercises Exercises: General Upper Extremity;Other exercises General Exercises - Upper Extremity Shoulder Flexion: 5 reps (PROM to elevate, hold with eccentric lower by pt) Elbow Flexion: 5 reps (PROM to elevate, hold with eccentric lower by pt) Other Exercises Other Exercises: Scapular retraction; sitting; visual cues; Facilitating ROM of R scapula. x10 Other Exercises: lateral leans and weight bearing through R elbow. Pt pushing up into upright posture with Min A to faciltiate hand placement. Pt initating with R elbow to push up Other Exercises: Forward flexion of shoulder with table slides; use of pillow case  to perform PROM to slide forward and then AAROM to slide back; x10   Shoulder Instructions       General Comments SpO2 90s on RA. Placed pt on 1L per RN request at end of session    Pertinent Vitals/ Pain       Pain Assessment: Faces Faces Pain Scale: No hurt Pain Intervention(s): Monitored during session  Home Living                                          Prior Functioning/Environment              Frequency  Min 2X/week        Progress Toward Goals  OT Goals(current goals can now be found in the care plan section)  Progress towards OT goals: Progressing toward goals  Acute Rehab OT Goals Patient Stated Goal: Regain independence per Niece OT Goal Formulation: With family Time For Goal Achievement: 08/15/20 Potential to Achieve Goals: Good ADL Goals Pt Will Perform Grooming: with supervision;sitting Pt Will Perform Upper Body Bathing: with min assist;sitting Pt Will Perform Upper Body Dressing: with min guard assist;sitting Pt Will Transfer to Toilet: with min assist;stand pivot transfer;bedside commode Pt Will Perform Toileting - Clothing Manipulation and hygiene: with min assist;sitting/lateral leans  Plan Discharge plan remains appropriate    Co-evaluation                 AM-PAC OT "6 Clicks" Daily Activity     Outcome Measure   Help from another person eating meals?: A Little Help from another person taking care of personal grooming?: A Lot Help from another person toileting, which includes using toliet, bedpan, or urinal?: A Lot Help from another person bathing (including washing, rinsing, drying)?: A Lot Help from another person to put on and taking off regular upper body clothing?: A Lot Help from another person to put on and taking off regular lower body clothing?: A Lot 6 Click Score: 13    End of Session Equipment Utilized During Treatment: Oxygen  OT Visit Diagnosis: Unsteadiness on feet (R26.81);Other abnormalities  of gait and mobility (R26.89);Muscle weakness (generalized) (M62.81);Hemiplegia and hemiparesis Hemiplegia - Right/Left: Right Hemiplegia - dominant/non-dominant: Dominant Hemiplegia - caused by: Cerebral infarction   Activity Tolerance Patient tolerated treatment well   Patient Left in bed;with call bell/phone within reach;with nursing/sitter in room   Nurse Communication Mobility status        Time: 3536-1443 OT Time Calculation (min): 46 min  Charges: OT General Charges $OT Visit: 1 Visit OT Treatments $Therapeutic Activity: 8-22 mins $Therapeutic Exercise: 23-37 mins  Rachel Rison MSOT, OTR/L Acute Rehab Pager: [phone] Office: [phone]   Theodoro Grist Shagun Wordell 08/03/2020, 5:14 PM

## 2020-08-03 NOTE — Progress Notes (Signed)
Progress Note  Patient Name: Ellen Barajas Date of Encounter: 08/03/2020  Primary Cardiologist:   Kristeen Miss, MD   Subjective   Seems to answer questions appropriately.  No pain. No SOB.  Inpatient Medications    Scheduled Meds:  aspirin EC  81 mg Oral Daily   atorvastatin  40 mg Oral Daily   cephALEXin  500 mg Oral Q8H   clopidogrel  75 mg Oral Daily   enoxaparin (LOVENOX) injection  40 mg Subcutaneous Q24H   furosemide  20 mg Oral Daily   losartan  25 mg Oral Daily   metoprolol tartrate  12.5 mg Oral BID   Continuous Infusions:  PRN Meds: acetaminophen, albuterol, bisacodyl, ondansetron (ZOFRAN) IV, polyethylene glycol   Vital Signs    Vitals:   08/02/20 2005 08/02/20 2350 08/03/20 0456 08/03/20 0826  BP: 134/86 121/60 (!) 150/91 (!) 148/95  Pulse: (!) 110 91 96 85  Resp: 18 17 16 18   Temp: (!) 97.5 F (36.4 C) 98.1 F (36.7 C) 98.1 F (36.7 C) 98.5 F (36.9 C)  TempSrc: Oral Oral Oral Oral  SpO2: 97% 98% 100% 99%  Weight:      Height:        Intake/Output Summary (Last 24 hours) at 08/03/2020 0917 Last data filed at 08/03/2020 0826 Gross per 24 hour  Intake 320 ml  Output 1800 ml  Net -1480 ml   Filed Weights   07/31/20 1244 08/01/20 0644  Weight: 71.7 kg 68.4 kg    Telemetry    Atrial fib with reduced rate compared to previous - Personally Reviewed  ECG     - Personally Reviewed  Physical Exam   GEN: No acute distress.   Neck: No  JVD Cardiac: Irreulgar RR, 3/6 apical systolic murmur, no diastolic murmurs, rubs, or gallops.  Respiratory: Clear  to auscultation bilaterally. GI: Soft, nontender, non-distended  MS: No  edema; No deformity. Neuro:    Right sided weakness Psych: Normal affect   Labs    Chemistry Recent Labs  Lab 08/01/20 0015 08/02/20 0246 08/03/20 0415  NA 134* 137 138  K 4.2 3.9 3.5  CL 105 104 102  CO2 22 25 29   GLUCOSE 145* 95 101*  BUN 22 16 17   CREATININE 0.88 0.84 0.90  CALCIUM 8.9 8.9 8.6*  PROT  5.6* 5.6* 5.3*  ALBUMIN 3.2* 3.2* 2.9*  AST 40 27 22  ALT 17 16 17   ALKPHOS 61 56 57  BILITOT 1.5* 1.3* 1.3*  GFRNONAA >60 >60 >60  ANIONGAP 7 8 7      Hematology Recent Labs  Lab 08/01/20 0015 08/02/20 0246 08/03/20 0415  WBC 9.9 7.9 6.8  RBC 4.57 4.48 4.43  HGB 13.4 13.2 12.7  HCT 40.3 40.5 39.9  MCV 88.2 90.4 90.1  MCH 29.3 29.5 28.7  MCHC 33.3 32.6 31.8  RDW 14.2 14.3 14.0  PLT 207 200 196    Cardiac EnzymesNo results for input(s): TROPONINI in the last 168 hours. No results for input(s): TROPIPOC in the last 168 hours.   BNPNo results for input(s): BNP, PROBNP in the last 168 hours.   DDimer No results for input(s): DDIMER in the last 168 hours.   Radiology    MR BRAIN WO CONTRAST  Result Date: 08/01/2020 CLINICAL DATA:  Acute stroke suspected EXAM: MRI HEAD WITHOUT CONTRAST TECHNIQUE: Multiplanar, multiecho pulse sequences of the brain and surrounding structures were obtained without intravenous contrast. COMPARISON:  CT and CTA from earlier today FINDINGS: Brain: Moderate  to large area of cortically based infarction in the high and parasagittal left frontal lobe, best aligned to the ACA distribution. A tiny cortically based infarct is seen at the cortex of the anterior left temporal lobe. Confluent chronic small vessel ischemia in the cerebral white matter and pons. No acute hemorrhage, hydrocephalus, or masslike finding. Generalized brain atrophy. Vascular: Recent CTA.  No additional findings. Skull and upper cervical spine: Normal marrow signal Sinuses/Orbits: Right maxillary hemosinus, known. Bilateral cataract resection. IMPRESSION: 1. Moderate to large acute left ACA branch infarct. 2. Tiny acute infarct in the left anterior temporal cortex. 3. Atrophy and extensive chronic small vessel ischemia. Electronically Signed   By: Marnee Spring M.D.   On: 08/01/2020 12:00   ECHOCARDIOGRAM COMPLETE  Result Date: 08/01/2020    ECHOCARDIOGRAM REPORT   Patient Name:   Ellen Barajas Date of Exam: 08/01/2020 Medical Rec #:  [id number]    Height:       63.0 in Accession #:    [id number]   Weight:       150.8 lb Date of Birth:  May 16, 1928     BSA:          1.715 m Patient Age:    87 years     BP:           151/90 mmHg Patient Gender: F            HR:           88 bpm. Exam Location:  Inpatient Procedure: 2D Echo, Cardiac Doppler and Color Doppler Indications:    NSTEMI I21.4  History:        Patient has prior history of Echocardiogram examinations, most                 recent 10/14/2019. CAD and Previous Myocardial Infarction, Prior                 CABG, Arrythmias:Atrial Fibrillation; Risk Factors:Dyslipidemia.  Sonographer:    Renella Cunas RDCS Referring Phys: [id number] ALLISON WOLFE IMPRESSIONS  1. Left ventricular ejection fraction, by estimation, is 25 to 30%. The left ventricle has severely decreased function. The left ventricle demonstrates regional wall motion abnormalities (see scoring diagram/findings for description). The left ventricular internal cavity size was mildly dilated. Left ventricular diastolic parameters are indeterminate.  2. Right ventricular systolic function is mildly reduced. The right ventricular size is normal. There is severely elevated pulmonary artery systolic pressure. The estimated right ventricular systolic pressure is 71.6 mmHg.  3. Left atrial size was mildly dilated.  4. The mitral valve is abnormal. Mild to moderate mitral valve regurgitation. The mean mitral valve gradient is 2.4 mmHg with average heart rate of 91 bpm. Severe mitral annular calcification.  5. Tricuspid valve regurgitation is mild to moderate.  6. The inferior vena cava is dilated in size with <50% respiratory variability, suggesting right atrial pressure of 15 mmHg.  7. The aortic valve is tricuspid. Aortic valve regurgitation is mild to moderate. Aortic regurgitation PHT measures 450 msec. Aortic valve mean gradient measures 11.0 mmHg. Low Flow Low Gradient Aortic Stenosis suspected, in the  setting of diminished stroke volume index and valve morphology. Comparison(s): A prior study was performed on 10/14/2019. Prior images reviewed side by side. Significant changes in LVEF have occurred. FINDINGS  Left Ventricle: Left ventricular ejection fraction, by estimation, is 25 to 30%. The left ventricle has severely decreased function. The left ventricle demonstrates regional wall motion abnormalities. The left ventricular internal cavity  size was mildly  dilated. There is no left ventricular hypertrophy. Left ventricular diastolic parameters are indeterminate.  LV Wall Scoring: The anterior wall, antero-lateral wall, and posterior wall are hypokinetic. Right Ventricle: The right ventricular size is normal. No increase in right ventricular wall thickness. Right ventricular systolic function is mildly reduced. There is severely elevated pulmonary artery systolic pressure. The tricuspid regurgitant velocity is 3.76 m/s, and with an assumed right atrial pressure of 15 mmHg, the estimated right ventricular systolic pressure is 71.6 mmHg. Left Atrium: Left atrial size was mildly dilated. Right Atrium: Right atrial size was normal in size. Pericardium: Trivial pericardial effusion is present. Mitral Valve: The mitral valve is abnormal. Severe mitral annular calcification. Mild to moderate mitral valve regurgitation. The mean mitral valve gradient is 2.4 mmHg with average heart rate of 91 bpm. Tricuspid Valve: The tricuspid valve is grossly normal. Tricuspid valve regurgitation is mild to moderate. Aortic Valve: The aortic valve is tricuspid. Aortic valve regurgitation is mild to moderate. Aortic regurgitation PHT measures 450 msec. Aortic valve mean gradient measures 11.0 mmHg. Aortic valve peak gradient measures 20.2 mmHg. Aortic valve area, by VTI measures 0.88 cm. Pulmonic Valve: The pulmonic valve was not well visualized. Pulmonic valve regurgitation is mild. No evidence of pulmonic stenosis. Aorta: The aortic  root is normal in size and structure. Venous: The inferior vena cava is dilated in size with less than 50% respiratory variability, suggesting right atrial pressure of 15 mmHg. IAS/Shunts: The atrial septum is grossly normal.  LEFT VENTRICLE PLAX 2D LVIDd:         5.70 cm LVIDs:         4.90 cm LV PW:         0.80 cm LV IVS:        0.80 cm LVOT diam:     2.00 cm LV SV:         41 LV SV Index:   24 LVOT Area:     3.14 cm  LV Volumes (MOD) LV vol d, MOD A2C: 128.0 ml LV vol d, MOD A4C: 123.0 ml LV vol s, MOD A2C: 95.6 ml LV vol s, MOD A4C: 85.6 ml LV SV MOD A2C:     32.4 ml LV SV MOD A4C:     123.0 ml LV SV MOD BP:      33.2 ml RIGHT VENTRICLE RV S prime:     6.08 cm/s TAPSE (M-mode): 1.1 cm LEFT ATRIUM             Index       RIGHT ATRIUM           Index LA diam:        3.90 cm 2.27 cm/m  RA Area:     16.50 cm LA Vol (A2C):   52.0 ml 30.32 ml/m RA Volume:   41.50 ml  24.20 ml/m LA Vol (A4C):   52.6 ml 30.67 ml/m LA Biplane Vol: 57.0 ml 33.24 ml/m  AORTIC VALVE AV Area (Vmax):    0.95 cm AV Area (Vmean):   0.89 cm AV Area (VTI):     0.88 cm AV Vmax:           225.00 cm/s AV Vmean:          151.500 cm/s AV VTI:            0.464 m AV Peak Grad:      20.2 mmHg AV Mean Grad:      11.0 mmHg LVOT Vmax:  67.80 cm/s LVOT Vmean:        42.900 cm/s LVOT VTI:          0.130 m LVOT/AV VTI ratio: 0.28 AI PHT:            450 msec  AORTA Ao Root diam: 3.30 cm Ao Asc diam:  3.30 cm MITRAL VALVE              TRICUSPID VALVE MV Mean grad: 2.4 mmHg    TR Peak grad:   56.6 mmHg MR Peak grad: 122.3 mmHg  TR Vmax:        376.00 cm/s MR Mean grad: 76.0 mmHg MR Vmax:      553.00 cm/s SHUNTS MR Vmean:     408.0 cm/s  Systemic VTI:  0.13 m                           Systemic Diam: 2.00 cm Riley Lam MD Electronically signed by Riley Lam MD Signature Date/Time: 08/01/2020/1:52:37 PM    Final     Cardiac Studies   ECHO:  08/01/2020  1. Left ventricular ejection fraction, by estimation, is 25 to 30%. The   left ventricle has severely decreased function. The left ventricle  demonstrates regional wall motion abnormalities (see scoring  diagram/findings for description). The left  ventricular internal cavity size was mildly dilated. Left ventricular  diastolic parameters are indeterminate.   2. Right ventricular systolic function is mildly reduced. The right  ventricular size is normal. There is severely elevated pulmonary artery  systolic pressure. The estimated right ventricular systolic pressure is  71.6 mmHg.   3. Left atrial size was mildly dilated.   4. The mitral valve is abnormal. Mild to moderate mitral valve  regurgitation. The mean mitral valve gradient is 2.4 mmHg with average  heart rate of 91 bpm. Severe mitral annular calcification.   5. Tricuspid valve regurgitation is mild to moderate.   6. The inferior vena cava is dilated in size with <50% respiratory  variability, suggesting right atrial pressure of 15 mmHg.   7. The aortic valve is tricuspid. Aortic valve regurgitation is mild to  moderate. Aortic regurgitation PHT measures 450 msec. Aortic valve mean  gradient measures 11.0 mmHg. Low Flow Low Gradient Aortic Stenosis  suspected, in the setting of diminished  stroke volume index and valve morphology.   Patient Profile     87 y.o. female NSTEMI, new HF and stroke after fall with prolonged down time   Assessment & Plan    STROKE TIA:   Plan resume Xarelto and use Plavix.  Stop ASA.   HTN: Plan gradual normalization of BPslowly.    ATRIAL FIB:    Start Xarelto and stop ASA when OK with primary team.    DYSLIPIDEMIA:  Statin started.  Follow lipid profile in 3 months.   HFrEF:    This is newly reduced with evidence of regional wall motion abnormality, possible low gradient AS (although the gradient was not significantly elevated last year with the EF was 55%), and elevated pulmonary pressure (again was mild last year and likely related to newly decreased LV  function.)    Net negative intake and output.  Continue low dose oral Lasix.   Low dose beta blocker started yesterday.  Agree with low dose Cozaar for now.   I will suggest increasing the beta blocker slowly tomorrow.  Atrial fib rate is coming down.   For questions or updates,  please contact [facility] Please consult [email or web address] for contact info under Cardiology/STEMI.   Signed, Rollene Rotunda, MD  08/03/2020, 9:17 AM

## 2020-08-04 ENCOUNTER — Ambulatory Visit: Payer: Medicare Other | Admitting: Family Medicine

## 2020-08-04 DIAGNOSIS — E785 Hyperlipidemia, unspecified: Secondary | ICD-10-CM | POA: Diagnosis not present

## 2020-08-04 DIAGNOSIS — I214 Non-ST elevation (NSTEMI) myocardial infarction: Secondary | ICD-10-CM | POA: Diagnosis not present

## 2020-08-04 DIAGNOSIS — I63422 Cerebral infarction due to embolism of left anterior cerebral artery: Secondary | ICD-10-CM | POA: Diagnosis not present

## 2020-08-04 DIAGNOSIS — S0292XA Unspecified fracture of facial bones, initial encounter for closed fracture: Secondary | ICD-10-CM | POA: Diagnosis not present

## 2020-08-04 LAB — CBC
HCT: 40.5 % (ref 36.0–46.0)
Hemoglobin: 12.9 g/dL (ref 12.0–15.0)
MCH: 28.7 pg (ref 26.0–34.0)
MCHC: 31.9 g/dL (ref 30.0–36.0)
MCV: 90.2 fL (ref 80.0–100.0)
Platelets: 220 10*3/uL (ref 150–400)
RBC: 4.49 MIL/uL (ref 3.87–5.11)
RDW: 14.2 % (ref 11.5–15.5)
WBC: 6.4 10*3/uL (ref 4.0–10.5)
nRBC: 0 % (ref 0.0–0.2)

## 2020-08-04 LAB — COMPREHENSIVE METABOLIC PANEL
ALT: 83 U/L — ABNORMAL HIGH (ref 0–44)
AST: 93 U/L — ABNORMAL HIGH (ref 15–41)
Albumin: 2.9 g/dL — ABNORMAL LOW (ref 3.5–5.0)
Alkaline Phosphatase: 92 U/L (ref 38–126)
Anion gap: 8 (ref 5–15)
BUN: 18 mg/dL (ref 8–23)
CO2: 29 mmol/L (ref 22–32)
Calcium: 8.5 mg/dL — ABNORMAL LOW (ref 8.9–10.3)
Chloride: 99 mmol/L (ref 98–111)
Creatinine, Ser: 0.88 mg/dL (ref 0.44–1.00)
GFR, Estimated: >60 mL/min (ref >60)
Glucose, Bld: 100 mg/dL — ABNORMAL HIGH (ref 70–99)
Potassium: 3.4 mmol/L — ABNORMAL LOW (ref 3.5–5.1)
Sodium: 136 mmol/L (ref 135–145)
Total Bilirubin: 1.6 mg/dL — ABNORMAL HIGH (ref 0.3–1.2)
Total Protein: 5.4 g/dL — ABNORMAL LOW (ref 6.5–8.1)

## 2020-08-04 MED ORDER — POTASSIUM CHLORIDE 20 MEQ PO PACK
40.0000 meq | PACK | Freq: Once | ORAL | Status: AC
  Administered 2020-08-04: 40 meq via ORAL
  Filled 2020-08-04: qty 2

## 2020-08-04 MED ORDER — METOPROLOL TARTRATE 25 MG PO TABS
25.0000 mg | ORAL_TABLET | Freq: Two times a day (BID) | ORAL | Status: DC
  Administered 2020-08-04 – 2020-08-05 (×3): 25 mg via ORAL
  Filled 2020-08-04 (×3): qty 1

## 2020-08-04 MED ORDER — RIVAROXABAN 15 MG PO TABS
15.0000 mg | ORAL_TABLET | Freq: Every day | ORAL | Status: DC
  Administered 2020-08-04: 15 mg via ORAL
  Filled 2020-08-04: qty 1

## 2020-08-04 NOTE — Progress Notes (Signed)
Inpatient Rehabilitation Admissions Coordinator   I received call from niece/Caregiver. She can arrange 24/7 caregiver support at home after a CIR admit. I will begin Auth with [employer] for a possible Cir admit penidng insurance approval.  Ottie Glazier, RN, MSN Rehab Admissions Coordinator [phone] 08/04/2020 8:13 AM

## 2020-08-04 NOTE — Progress Notes (Signed)
Progress Note  Patient Name: Ellen Barajas Date of Encounter: 08/04/2020  Primary Cardiologist:   Kristeen Miss, MD   Subjective   Answers some questions slowly and denies pain or SOB. .  Inpatient Medications    Scheduled Meds:  aspirin EC  81 mg Oral Daily   atorvastatin  40 mg Oral Daily   cephALEXin  500 mg Oral Q8H   clopidogrel  75 mg Oral Daily   enoxaparin (LOVENOX) injection  40 mg Subcutaneous Q24H   furosemide  20 mg Oral Daily   losartan  25 mg Oral Daily   metoprolol tartrate  12.5 mg Oral BID   Continuous Infusions:  PRN Meds: acetaminophen, albuterol, bisacodyl, ondansetron (ZOFRAN) IV, polyethylene glycol   Vital Signs    Vitals:   08/03/20 1949 08/03/20 2318 08/04/20 0344 08/04/20 0735  BP: (!) 113/98 136/64 (!) 149/76 (!) 171/73  Pulse: 100 96 77 80  Resp: 18 19 18    Temp: 98.6 F (37 C) 98.2 F (36.8 C) 98.1 F (36.7 C) 97.7 F (36.5 C)  TempSrc: Oral Oral Oral Oral  SpO2: 97% 100% 97% 99%  Weight:      Height:        Intake/Output Summary (Last 24 hours) at 08/04/2020 0758 Last data filed at 08/04/2020 10/05/2020 Gross per 24 hour  Intake --  Output 1700 ml  Net -1700 ml   Filed Weights   07/31/20 1244 08/01/20 0644  Weight: 71.7 kg 68.4 kg    Telemetry    Atrial fib with PVCs- Personally Reviewed  ECG    NA - Personally Reviewed  Physical Exam   GEN: No  acute distress.   Neck: No  JVD Cardiac:   Irregular RR, 3/6 apical systolic murmur, no diastolic murmurs, rubs, or gallops.  Respiratory: Clear  to auscultation bilaterally. GI: Soft, nontender, non-distended, normal bowel sounds  MS:  No edema; No deformity. Neuro:   Nonfocal  Psych: Oriented and appropriate    Labs    Chemistry Recent Labs  Lab 08/02/20 0246 08/03/20 0415 08/04/20 0329  NA 137 138 136  K 3.9 3.5 3.4*  CL 104 102 99  CO2 25 29 29   GLUCOSE 95 101* 100*  BUN 16 17 18   CREATININE 0.84 0.90 0.88  CALCIUM 8.9 8.6* 8.5*  PROT 5.6* 5.3* 5.4*   ALBUMIN 3.2* 2.9* 2.9*  AST 27 22 93*  ALT 16 17 83*  ALKPHOS 56 57 92  BILITOT 1.3* 1.3* 1.6*  GFRNONAA >60 >60 >60  ANIONGAP 8 7 8      Hematology Recent Labs  Lab 08/02/20 0246 08/03/20 0415 08/04/20 0329  WBC 7.9 6.8 6.4  RBC 4.48 4.43 4.49  HGB 13.2 12.7 12.9  HCT 40.5 39.9 40.5  MCV 90.4 90.1 90.2  MCH 29.5 28.7 28.7  MCHC 32.6 31.8 31.9  RDW 14.3 14.0 14.2  PLT 200 196 220    Cardiac EnzymesNo results for input(s): TROPONINI in the last 168 hours. No results for input(s): TROPIPOC in the last 168 hours.   BNPNo results for input(s): BNP, PROBNP in the last 168 hours.   DDimer No results for input(s): DDIMER in the last 168 hours.   Radiology    No results found.  Cardiac Studies   ECHO:  08/01/2020  1. Left ventricular ejection fraction, by estimation, is 25 to 30%. The  left ventricle has severely decreased function. The left ventricle  demonstrates regional wall motion abnormalities (see scoring  diagram/findings for description). The  left  ventricular internal cavity size was mildly dilated. Left ventricular  diastolic parameters are indeterminate.   2. Right ventricular systolic function is mildly reduced. The right  ventricular size is normal. There is severely elevated pulmonary artery  systolic pressure. The estimated right ventricular systolic pressure is  71.6 mmHg.   3. Left atrial size was mildly dilated.   4. The mitral valve is abnormal. Mild to moderate mitral valve  regurgitation. The mean mitral valve gradient is 2.4 mmHg with average  heart rate of 91 bpm. Severe mitral annular calcification.   5. Tricuspid valve regurgitation is mild to moderate.   6. The inferior vena cava is dilated in size with <50% respiratory  variability, suggesting right atrial pressure of 15 mmHg.   7. The aortic valve is tricuspid. Aortic valve regurgitation is mild to  moderate. Aortic regurgitation PHT measures 450 msec. Aortic valve mean  gradient measures  11.0 mmHg. Low Flow Low Gradient Aortic Stenosis  suspected, in the setting of diminished  stroke volume index and valve morphology.   Patient Profile     87 y.o. female NSTEMI, new HF and stroke after fall with prolonged down time   Assessment & Plan    STROKE TIA:   Plan resume Xarelto and use Plavix today if OK with neurology.   HTN:    BP is trending up.  Increased beta blocker.   ATRIAL FIB:    Start Xarelto and stop ASA as above.  DYSLIPIDEMIA:  Statin started this admission.  Follow lipid profile in 3 months.   HFrEF:    Newly reduced EF.  Net negative 2.8 liters this admission.  Net negative 1700 cc today.  Increase beta blocker today.     For questions or updates, please contact [facility] Please consult [email or web address] for contact info under Cardiology/STEMI.   Signed, Rollene Rotunda, MD  08/04/2020, 7:58 AM

## 2020-08-04 NOTE — Progress Notes (Addendum)
PROGRESS NOTE        PATIENT DETAILS Name: Ellen Barajas Age: 87 y.o. Sex: female Date of Birth: 1928/05/22 Admit Date: 07/31/2020 Admitting Physician Orland Mustard, MD PCP:No primary care provider on file.  Brief Narrative: Patient is a 87 y.o. female with history of A. fib on Xarelto, CAD s/p CABG, HTN, HLD-who was found at her house by her niece-confused-s/p fall-she was subsequently brought to the ED-where she was found to have non-STEMI, and acute CVA.  See below for further details  Significant events: 7/8>> found by niece-fall-confused-found to have non-STEMI/acute CVA.  Significant studies: 7/8>> CT head: No acute intracranial findings 7/8>> CT C-spine: No fracture/subluxation 7/8>> CT maxillofacial area: Mildly displaced fracture of right zygomatic arch, anterior/posterior lateral walls of the right maxillary sinus 7/8>> CXR: No pneumonia 7/9>> CTA head: Advanced intracranial stenosis-most notable in right M1 and left M2 segments, 2 mm A-comm aneurysm 7/9>> CTA neck: No flow limiting stenosis or ulceration in carotid arteries, occluded left vertebral artery 7/9>> MRI brain: Moderate to large acute left ACA infarct 7/9>> TTE: EF 25-30%, RVSP 71.6.  Left ventricle-anterior, anterolateral and posterior wall are hypokinetic 7/9>>LDL:140 7/10>> A1c: 6.0  Antimicrobial therapy: None  Microbiology data: 7/8>> COVID/influenza PCR: Negative  Procedures : None  Consults: Neurology, cardiology  DVT Prophylaxis : enoxaparin (LOVENOX) injection 40 mg Start: 08/01/20 2000Prophylactic Lovenox   Subjective: Sitting up in bed-eating breakfast-answering some questions with 1-2 word sentence-continues to have expressive aphasia.  Right-sided deficits remain unchanged.   Assessment/Plan: Non-STEMI: No further chest pain-lying comfortably in bed-after extensive discussion with family-plan is to manage conservatively.  Continue Plavix-cardiology stopping  aspirin and starting Xarelto (for A. fib)-remains on statin/beta-blocker.  Cardiology following.    Acute CVA: Suspicion for embolic CVA in the setting of non-STEMI and new LV dysfunction.  Continues to have unchanged right-sided deficits and expressive aphasia.  Initially was on anticoagulation-but given size of infarct-and risk for hemorrhagic transformation-held for a few days-plans are to restart Xarelto today (cleared with neurology-Dr.Xu).  CIR following-for potential CIR admission on discharge.  Newly diagnosed systolic heart failure: Suspect ischemic cardiomyopathy in the setting of non-STEMI.  Volume status is stable-cardiology following-on low-dose Lasix, metoprolol and losartan.  Persistent atrial fibrillation: Rate controlled-as noted above-restarting starting Xarelto today-discussed with neurology over the phone.    History of CAD s/p CABG  HTN: BP relatively stable-on losartan/metoprolol/Lasix-allow some permissive hypertension.  Multiple facial fractures: Admitting MD spoke with ENT-recommendations were for supportive care-empiric Keflex to prevent sinusitis (blood in maxillary sinus with pulling).  Mildly elevated CK: Probably either mild rhabdomyolysis or a sequelae of non-STEMI.  Mildly elevated transaminases: Unclear significance-watch closely for now.  May need to stop statins if trend continues to worsen  Hypokalemia: Replete and recheck.  Palliative care: DNR in place-discussed with niece at bedside-no heroics-okay with gentle medical treatment  Diet: Diet Order             Diet regular Room service appropriate? Yes; Fluid consistency: Thin  Diet effective now                    Code Status: Full code   Family Communication: [name] Chambers-niece-[phone]-updated over the phone on 7/12  Disposition Plan: Status is: Inpatient  Remains inpatient appropriate because:Inpatient level of care appropriate due to severity of illness  Dispo: The patient  is from:  Home              Anticipated d/c is to: CIR              Patient currently is not medically stable to d/c.   Difficult to place patient No    Barriers to Discharge: Acute CVA-dysarthria-right-sided deficits-dysphagia-non-STEMI-work-up in progress-not stable for discharge.  Antimicrobial agents: Anti-infectives (From admission, onward)    Start     Dose/Rate Route Frequency Ordered Stop   07/31/20 1745  cephALEXin (KEFLEX) capsule 500 mg        500 mg Oral Every 8 hours 07/31/20 1736 08/07/20 1359        Time spent: 25 minutes-Greater than 50% of this time was spent in counseling, explanation of diagnosis, planning of further management, and coordination of care.  MEDICATIONS: Scheduled Meds:  aspirin EC  81 mg Oral Daily   atorvastatin  40 mg Oral Daily   cephALEXin  500 mg Oral Q8H   clopidogrel  75 mg Oral Daily   enoxaparin (LOVENOX) injection  40 mg Subcutaneous Q24H   furosemide  20 mg Oral Daily   losartan  25 mg Oral Daily   metoprolol tartrate  25 mg Oral BID   Continuous Infusions:   PRN Meds:.   PHYSICAL EXAM: Vital signs: Vitals:   08/03/20 1949 08/03/20 2318 08/04/20 0344 08/04/20 0735  BP: (!) 113/98 136/64 (!) 149/76 (!) 171/73  Pulse: 100 96 77 80  Resp: 18 19 18    Temp: 98.6 F (37 C) 98.2 F (36.8 C) 98.1 F (36.7 C) 97.7 F (36.5 C)  TempSrc: Oral Oral Oral Oral  SpO2: 97% 100% 97% 99%  Weight:      Height:       Filed Weights   07/31/20 1244 08/01/20 0644  Weight: 71.7 kg 68.4 kg   Body mass index is 26.71 kg/m.   Gen Exam:Alert awake-seems to have improved-able to say a few more words today. HEENT:atraumatic, normocephalic Chest: B/L clear to auscultation anteriorly CVS:S1S2 regular Abdomen:soft non tender, non distended Neurology: Right-sided deficits unchanged Skin: no rash   I have personally reviewed following labs and imaging studies  LABORATORY DATA: CBC: Recent Labs  Lab 07/31/20 1304 07/31/20 1309  08/01/20 0015 08/02/20 0246 08/03/20 0415 08/04/20 0329  WBC 9.7  --  9.9 7.9 6.8 6.4  NEUTROABS 8.4*  --   --   --   --   --   HGB 13.8 14.6 13.4 13.2 12.7 12.9  HCT 43.6 43.0 40.3 40.5 39.9 40.5  MCV 90.8  --  88.2 90.4 90.1 90.2  PLT 243  --  207 200 196 220     Basic Metabolic Panel: Recent Labs  Lab 07/31/20 1304 07/31/20 1309 08/01/20 0015 08/02/20 0246 08/03/20 0415 08/04/20 0329  NA 137 138 134* 137 138 136  K 4.5 4.4 4.2 3.9 3.5 3.4*  CL 103  --  105 104 102 99  CO2 23  --  22 25 29 29   GLUCOSE 123*  --  145* 95 101* 100*  BUN 21  --  22 16 17 18   CREATININE 1.05*  --  0.88 0.84 0.90 0.88  CALCIUM 9.2  --  8.9 8.9 8.6* 8.5*  MG 2.2  --   --   --   --   --   PHOS 4.5  --   --   --   --   --      GFR: Estimated Creatinine Clearance: 40.2 mL/min (  by C-G formula based on SCr of 0.88 mg/dL).  Liver Function Tests: Recent Labs  Lab 07/31/20 1304 08/01/20 0015 08/02/20 0246 08/03/20 0415 08/04/20 0329  AST 50* 40 27 22 93*  ALT 18 17 16 17  83*  ALKPHOS 70 61 56 57 92  BILITOT 1.6* 1.5* 1.3* 1.3* 1.6*  PROT 6.8 5.6* 5.6* 5.3* 5.4*  ALBUMIN 3.9 3.2* 3.2* 2.9* 2.9*    Recent Labs  Lab 07/31/20 1304  LIPASE 24    No results for input(s): AMMONIA in the last 168 hours.  Coagulation Profile: Recent Labs  Lab 07/31/20 1304  INR 1.1     Cardiac Enzymes: Recent Labs  Lab 07/31/20 1304 08/01/20 0015  CKTOTAL 731* 448*     BNP (last 3 results) No results for input(s): PROBNP in the last 8760 hours.  Lipid Profile: Recent Labs    08/02/20 0246  CHOL 199  HDL 53  LDLCALC 130*  TRIG 79  CHOLHDL 3.8     Thyroid Function Tests: No results for input(s): TSH, T4TOTAL, FREET4, T3FREE, THYROIDAB in the last 72 hours.  Anemia Panel: No results for input(s): VITAMINB12, FOLATE, FERRITIN, TIBC, IRON, RETICCTPCT in the last 72 hours.  Urine analysis: No results found for: COLORURINE, APPEARANCEUR, LABSPEC, PHURINE, GLUCOSEU, HGBUR,  BILIRUBINUR, KETONESUR, PROTEINUR, UROBILINOGEN, NITRITE, LEUKOCYTESUR  Sepsis Labs: Lactic Acid, Venous    Component Value Date/Time   LATICACIDVEN 2.2 (HH) 07/31/2020 2023    MICROBIOLOGY: Recent Results (from the past 240 hour(s))  Resp Panel by RT-PCR (Flu A&B, Covid) Nasopharyngeal Swab     Status: None   Collection Time: 07/31/20  1:26 PM   Specimen: Nasopharyngeal Swab; Nasopharyngeal(NP) swabs in vial transport medium  Result Value Ref Range Status   SARS Coronavirus 2 by RT PCR NEGATIVE NEGATIVE Final    Comment: (NOTE) SARS-CoV-2 target nucleic acids are NOT DETECTED.  The SARS-CoV-2 RNA is generally detectable in upper respiratory specimens during the acute phase of infection. The lowest concentration of SARS-CoV-2 viral copies this assay can detect is 138 copies/mL. A negative result does not preclude SARS-Cov-2 infection and should not be used as the sole basis for treatment or other patient management decisions. A negative result may occur with  improper specimen collection/handling, submission of specimen other than nasopharyngeal swab, presence of viral mutation(s) within the areas targeted by this assay, and inadequate number of viral copies(<138 copies/mL). A negative result must be combined with clinical observations, patient history, and epidemiological information. The expected result is Negative.  Fact Sheet for Patients:  10/01/20  Fact Sheet for Healthcare Providers:  BloggerCourse.com  This test is no t yet approved or cleared by the [employer] FDA and  has been authorized for detection and/or diagnosis of SARS-CoV-2 by FDA under an Emergency Use Authorization (EUA). This EUA will remain  in effect (meaning this test can be used) for the duration of the COVID-19 declaration under Section 564(b)(1) of the Act, 21 U.S.C.section 360bbb-3(b)(1), unless the authorization is terminated  or  revoked sooner.       Influenza A by PCR NEGATIVE NEGATIVE Final   Influenza B by PCR NEGATIVE NEGATIVE Final    Comment: (NOTE) The Xpert Xpress SARS-CoV-2/FLU/RSV plus assay is intended as an aid in the diagnosis of influenza from Nasopharyngeal swab specimens and should not be used as a sole basis for treatment. Nasal washings and aspirates are unacceptable for Xpert Xpress SARS-CoV-2/FLU/RSV testing.  Fact Sheet for Patients: [place]  Fact Sheet for Healthcare Providers: BloggerCourse.com  This test is not yet approved or cleared by the [place] and has been authorized for detection and/or diagnosis of SARS-CoV-2 by FDA under an Emergency Use Authorization (EUA). This EUA will remain in effect (meaning this test can be used) for the duration of the COVID-19 declaration under Section 564(b)(1) of the Act, 21 U.S.C. section 360bbb-3(b)(1), unless the authorization is terminated or revoked.  Performed at [facility], [address] [place], [place] [address]     RADIOLOGY STUDIES/RESULTS: No results found.   LOS: 4 days   Jeoffrey Massed, MD  [facility]    To contact the attending provider between 7A-7P or the covering provider during after hours 7P-7A, please log into the web site [email or web address] and access using universal [place] password for that web site. If you do not have the password, please call the hospital operator.  08/04/2020, 11:11 AM

## 2020-08-04 NOTE — Progress Notes (Signed)
Physical Therapy Treatment Patient Details Name: Ellen Barajas MRN: [id number] DOB: 10/20/1928 Today's Date: 08/04/2020    History of Present Illness 87 y/o female who presented on 7/8 after being found down for unknown time. Elevated troponin and NSTEMI in ED. In ED, RN found patient to have R facial droop, R arm and leg weakness, and expressive aphasia. 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 MRI confirms moderate to large acute L ACA infarct as well as a tiny acute infarct in the L anterior temporal cortex. PMH: Afib on coumadin, CAD s/p CABG, HLD, MI 2022, diverticulosis    PT Comments    Pt progressing towards physical therapy goals. Was able to progress to gait training this session with 2nd person for physical assist and nursing student for a chair follow. Pt may benefit from utilizing the railing in the hallway to progress ambulation next session. Will continue to follow and progress as able per POC.   Follow Up Recommendations  CIR     Equipment Recommendations  Other (comment) (TBD by next venue of care)    Recommendations for Other Services Rehab consult     Precautions / Restrictions Precautions Precautions: Fall Precaution Comments: expressive aphasia, very hard of hearing Restrictions Weight Bearing Restrictions: No    Mobility  Bed Mobility Overal bed mobility: Needs Assistance Bed Mobility: Rolling;Sidelying to Sit;Sit to Sidelying Rolling: Min assist Sidelying to sit: Min assist       General bed mobility comments: VC's for sequencing and attending to RUE. Assist for RUE management and trunk elevation to full sitting position.    Transfers Overall transfer level: Needs assistance Equipment used: 2 person hand held assist Transfers: Sit to/from [employer] Sit to Stand: Min assist;+2 safety/equipment;Mod assist Stand pivot transfers: Min assist;+2 safety/equipment;Mod assist;+2 physical assistance       General transfer comment: VC's for  improved posture and sequencing. Therapist facilitating weight shift to be able to advance RLE.  Ambulation/Gait Ambulation/Gait assistance: Min assist;Mod assist;+2 physical assistance;+2 safety/equipment Gait Distance (Feet): 8 Feet Assistive device: 2 person hand held assist Gait Pattern/deviations: Step-through pattern;Decreased stride length;Trunk flexed Gait velocity: Decreased Gait velocity interpretation: <1.31 ft/sec, indicative of household ambulator General Gait Details: Facilitation of weight shift and advancing LE's during gait. Pt was able to swing through on R however had increased difficulty bearing weight without knee buckle.   Stairs             Wheelchair Mobility    Modified Rankin (Stroke Patients Only) Modified Rankin (Stroke Patients Only) Pre-Morbid Rankin Score: Slight disability Modified Rankin: Moderately severe disability     Balance Overall balance assessment: Needs assistance Sitting-balance support: No upper extremity supported;Feet supported Sitting balance-Leahy Scale: Fair Sitting balance - Comments: Tendency for lateral lean to R; reaching to hold onto bedrail tightly with L hand Postural control: Right lateral lean Standing balance support: Bilateral upper extremity supported;During functional activity Standing balance-Leahy Scale: Poor                              Cognition Arousal/Alertness: Awake/alert Behavior During Therapy: WFL for tasks assessed/performed Overall Cognitive Status: Impaired/Different from baseline Area of Impairment: Following commands                       Following Commands: Follows one step commands consistently;Follows one step commands with increased time       General Comments: Pt following one step  commands inconsistently; benefits from visual cues. Agreeable to exercises and sitting at EOB; very motivated despite cognitive deficits.      Exercises General Exercises - Upper  Extremity Shoulder Flexion: 5 reps (PROM to elevate, hold with eccentric lower by pt) Elbow Flexion: 5 reps (PROM to elevate, hold with eccentric lower by pt) General Exercises - Lower Extremity Long Arc Quad: 10 reps;Right;AROM    General Comments        Pertinent Vitals/Pain Pain Assessment: Faces Faces Pain Scale: No hurt Pain Intervention(s): Limited activity within patient's tolerance;Monitored during session;Repositioned    Home Living                      Prior Function            PT Goals (current goals can now be found in the care plan section) Acute Rehab PT Goals Patient Stated Goal: Regain independence per Niece PT Goal Formulation: With patient/family Time For Goal Achievement: 08/15/20 Potential to Achieve Goals: Good Progress towards PT goals: Progressing toward goals    Frequency    Min 4X/week      PT Plan Current plan remains appropriate    Co-evaluation              AM-PAC PT "6 Clicks" Mobility   Outcome Measure  Help needed turning from your back to your side while in a flat bed without using bedrails?: A Little Help needed moving from lying on your back to sitting on the side of a flat bed without using bedrails?: A Little Help needed moving to and from a bed to a chair (including a wheelchair)?: A Lot Help needed standing up from a chair using your arms (e.g., wheelchair or bedside chair)?: A Lot Help needed to walk in hospital room?: Total Help needed climbing 3-5 steps with a railing? : Total 6 Click Score: 12    End of Session Equipment Utilized During Treatment: Gait belt;Oxygen Activity Tolerance: Patient tolerated treatment well Patient left: in chair;with call bell/phone within reach;with chair alarm set (Niece, Psychologist, occupational) Nurse Communication: Mobility status PT Visit Diagnosis: Unsteadiness on feet (R26.81);Muscle weakness (generalized) (M62.81);History of falling (Z91.81);Difficulty in walking, not elsewhere  classified (R26.2);Other abnormalities of gait and mobility (R26.89);Hemiplegia and hemiparesis Hemiplegia - Right/Left: Right Hemiplegia - caused by: Cerebral infarction     Time: 7124-5809 PT Time Calculation (min) (ACUTE ONLY): 24 min  Charges:  $Gait Training: 23-37 mins                     Conni Slipper, PT, DPT Acute Rehabilitation Services Pager: [phone] Office: [phone]    Marylynn Pearson 08/04/2020, 1:43 PM

## 2020-08-04 NOTE — Progress Notes (Signed)
ANTICOAGULATION CONSULT NOTE - Initial Consult  Pharmacy Consult for Xarelto Indication: atrial fibrillation  Allergies  Allergen Reactions   Crestor [Rosuvastatin Calcium] Other (See Comments)    unknown   Lipitor [Atorvastatin Calcium] Other (See Comments)    unknown   Niaspan [Niacin Er] Other (See Comments)    unknown   Other Other (See Comments)    PER PATIENT SHE DOES NOT TOLERATE NARCOTICS   Pravastatin Other (See Comments)    unknown   Ramipril [Ramipril] Other (See Comments)    Hyperkalemia   Welchol [Colesevelam Hcl] Other (See Comments)    unknown   Zetia [Ezetimibe] Other (See Comments)    unknown    Patient Measurements: Height: 5\' 3"  (160 cm) Weight: 68.4 kg (150 lb 12.7 oz) IBW/kg (Calculated) : 52.4  Vital Signs: Temp: 97.3 F (36.3 C) (07/12 1200) Temp Source: Oral (07/12 1200) BP: 134/50 (07/12 1200) Pulse Rate: 78 (07/12 1200)  Labs: Recent Labs    08/02/20 0246 08/03/20 0415 08/04/20 0329  HGB 13.2 12.7 12.9  HCT 40.5 39.9 40.5  PLT 200 196 220  CREATININE 0.84 0.90 0.88    Estimated Creatinine Clearance: 40.2 mL/min (by C-G formula based on SCr of 0.88 mg/dL).   Medical History: Past Medical History:  Diagnosis Date   AF (atrial fibrillation) (HCC)    coumadin   Arthritis    "hands" (09/27/2017)   Coronary artery disease 2002   s/p CABG   Diverticulosis    Dyslipidemia    History of chicken pox    IHD (ischemic heart disease)    Myocardial infarction (HCC) ~ 2002   Post herpetic neuralgia 02/2012   Assessment: 87 yr old female on Xarelto 15 mg daily PTA for atrial fibrillation. Admitted 7/8 after fall PTA and found to have NSTEMI. Code stroke overnight early 08/01/20. Xarelto initially continued (last dose 7/8 pm), then was held 08/01/20 given size of infarct and risk for hemorrhagic transformation. Aspirin and Enoxaparin for VTE prophylaxis were added 7/9. Plavix begun 7/10, with plan to resume anticoagulation in a few days and  continue Plavix.   Anticoagulation to resume today. Aspirin and Enoxaparin have discontinued.  Lasix enoxaparin 40 mg dose ~10pm on 08/03/20.   Goal of Therapy:  Appropriate Xarelto dose for renal function and indication Monitor platelets by anticoagulation protocol: Yes   Plan:  Resume Xarelto 15 mg daily with supper tonight. Continue Plavix 75 mg daily per plan.   Monitor for bleeding.  10/04/20, RPh 08/04/2020,1:20 PM

## 2020-08-04 NOTE — PMR Pre-admission (Signed)
PMR Admission Coordinator Pre-Admission Assessment  Patient: Ellen Barajas is an 87 y.o., female MRN: [id number] DOB: November 18, 1928 Height: _0  (160 cm) Weight: 68.4 kg Insurance Information HMO:     PPO:      PCP:      IPA:      80/20:      OTHER:  PRIMARY: Blue Medicare      Policy#: [id number]      Subscriber: pt CM Name: Larene Beach      Phone#: [phone]     Fax#: [phone] Pre-Cert#: [id number]   approved for 7 days   Employer:  Benefits:  Phone #: [phone]     Name: 7/12 Eff. Date: 01/2020     Deduct: none      Out of Pocket Max: $3900      Life Max: none CIR: $335 co pay per day days 1 until 6      SNF: $188 co pay per day days 100 days Outpatient: $40 per visit     Co-Pay: visits per medical neccesity Home Health: 100%     Co-Pay: visits per medical neccesity DME:  80% Co-Pay: 20% Providers: in network  SECONDARY: none  Financial Counselor:       Phone#:   The Therapist, art Information Summary" for patients in Inpatient Rehabilitation Facilities with attached "Privacy Act [place] Records" was provided and verbally reviewed with: Patient and Family  Emergency Contact Information Contact Information     Name Relation Home Work Mobile   Steinauer Niece [phone]  [phone]   Dillinger,Charles Brooke Bonito. Son   [phone]       Current Medical History  Patient Admitting Diagnosis: CVA  History of Present Illness:  87 year old right-handed female with history of atrial fibrillation on Xarelto, CAD with CABG 2002, postherpetic neuralgia, hypertension, hyperlipidemia as well as history of hip fracture 2020.  Presents 07/31/2020 after being found down with acute onset of right-sided weakness facial droop and aphasia.  She did have bruising to the right side of her face and eye.  Cranial CT scan negative for acute changes.  CT cervical spine as well as maxillofacial showed mildly displaced fractures of the right zygomatic arch, anterior and posterior lateral walls  of the right maxillary sinus and possible diastasis of the right frontal zygomatic suture and conservative care was recommended and placed on empiric Keflex per ENT DrJuengel.  No other facial fractures.  Overlying right facial soft tissue swelling without orbital hematoma or foreign body.  CT cervical spine negative.  CT angiogram head and neck negative for large vessel occlusion.  There was a 2 mm A -comm aneurysm.  Patient did not receive TPA.  MRI identified moderate to large acute left ACA branch infarction.  Tiny acute infarct in the left anterior temporal cortex.  Echocardiogram with ejection fraction of 25 to 30%.  The left ventricle had severely decreased function.  Admission chemistries unremarkable except creatinine 1.05, troponin [id number], CK 731, lactic acid 1.9.  Cardiology service was consulted for elevated troponin.  EKG overall unchanged from previous tracings and no ischemic changes.  It was felt elevated troponin in the setting of fall and being down for an extended amount of time.  Awaiting plan for possible need for 30-day cardiac event monitor.  Patient was cleared to continue chronic Xarelto and Plavix was added for history of CAD/non-STEMI.  No further work-up at this time was indicated.  Close monitoring of heart rate with history of atrial fibrillation and adjustments made in beta-blocker.  Patient is tolerating a regular consistency diet.  Complete NIHSS TOTAL: 12  Patient's [facility]  has been reviewed by the rehabilitation admission coordinator and physician.  Past Medical History  Past Medical History:  Diagnosis Date   AF (atrial fibrillation) (HCC)    coumadin   Arthritis    "hands" (09/27/2017)   Coronary artery disease 2002   s/p CABG   Diverticulosis    Dyslipidemia    History of chicken pox    IHD (ischemic heart disease)    Myocardial infarction ([place]) ~ 2002   Post herpetic neuralgia 02/2012    Family History   family history  includes CAD (age of onset: 66) in her father; CAD (age of onset: 46) in her mother.  Prior Rehab/Hospitalizations Has the patient had prior rehab or hospitalizations prior to admission? Yes  Has the patient had major surgery during 100 days prior to admission? No   Current Medications  Current Facility-Administered Medications:    acetaminophen (TYLENOL) tablet 650 mg, 650 mg, Oral, Q6H PRN, Jonetta Osgood, MD, 650 mg at 08/03/20 2120   albuterol (PROVENTIL) (2.5 MG/3ML) 0.083% nebulizer solution 2.5 mg, 2.5 mg, Nebulization, Q4H PRN, Ghimire, Henreitta Leber, MD   atorvastatin (LIPITOR) tablet 40 mg, 40 mg, Oral, Daily, Rosalin Hawking, MD, 40 mg at 08/05/20 0900   bisacodyl (DULCOLAX) suppository 10 mg, 10 mg, Rectal, Daily PRN, Ghimire, Henreitta Leber, MD   cephALEXin (KEFLEX) capsule 500 mg, 500 mg, Oral, Q8H, Orma Flaming, MD, 500 mg at 08/05/20 9678   clopidogrel (PLAVIX) tablet 75 mg, 75 mg, Oral, Daily, Chandrasekhar, Mahesh A, MD, 75 mg at 08/05/20 0900   furosemide (LASIX) tablet 20 mg, 20 mg, Oral, Daily, Chandrasekhar, Mahesh A, MD, 20 mg at 08/05/20 0900   losartan (COZAAR) tablet 25 mg, 25 mg, Oral, Daily, Chandrasekhar, Mahesh A, MD, 25 mg at 08/05/20 0900   metoprolol tartrate (LOPRESSOR) tablet 37.5 mg, 37.5 mg, Oral, BID, Kathyrn Drown D, NP   ondansetron (ZOFRAN) injection 4 mg, 4 mg, Intravenous, Q6H PRN, Ghimire, Henreitta Leber, MD   polyethylene glycol (MIRALAX / GLYCOLAX) packet 17 g, 17 g, Oral, Daily PRN, Ghimire, Henreitta Leber, MD   potassium chloride SA (KLOR-CON) CR tablet 40 mEq, 40 mEq, Oral, BID, Sheikh, Omair Latif, DO, 40 mEq at 08/05/20 0900   Rivaroxaban (XARELTO) tablet 15 mg, 15 mg, Oral, Q supper, Skeet Simmer, RPH, 15 mg at 08/04/20 1724  Patients Current Diet:  Diet Order             Diet regular Room service appropriate? No; Fluid consistency: Thin  Diet effective now                   Precautions / Restrictions Precautions Precautions:  Fall Precaution Comments: expressive aphasia, very hard of hearing Restrictions Weight Bearing Restrictions: No   Has the patient had 2 or more falls or a fall with injury in the past year? Yes  Prior Activity Level Limited Community (1-2x/wk): Mod I with quad cane in the home; did not drive  Prior Functional Level Self Care: Did the patient need help bathing, dressing, using the toilet or eating? Independent  Indoor Mobility: Did the patient need assistance with walking from room to room (with or without device)? Independent  Stairs: Did the patient need assistance with internal or external stairs (with or without device)? Independent  Functional Cognition: Did the patient need help planning regular tasks such as shopping or remembering to  take medications? Independent  Home Assistive Devices / Equipment    Prior Device Use: Indicate devices/aids used by the patient prior to current illness, exacerbation or injury?  Quad cane in the home  Current Functional Level Cognition  Arousal/Alertness: Awake/alert Overall Cognitive Status: Impaired/Different from baseline Difficult to assess due to: Impaired communication Orientation Level: Oriented to person, Disoriented to place, Disoriented to time, Disoriented to situation Following Commands: Follows one step commands consistently, Follows one step commands with increased time Safety/Judgement: Decreased awareness of safety, Decreased awareness of deficits General Comments: Frequently attempts to get up during session before therapist was "Ready". Does not seem to understand that significant assistance is required on her R side and she needs assistance to get up right now. Frustrated with aphasia but does better with choices.    Extremity Assessment (includes Sensation/Coordination)  Upper Extremity Assessment: RUE deficits/detail RUE Deficits / Details: Able to grasp therpaist's hand (weakly) and lower arm slowly from 90* shoulder  flexion. RUE Sensation: WNL RUE Coordination: decreased fine motor, decreased gross motor  Lower Extremity Assessment: Defer to PT evaluation RLE Deficits / Details: Difficulty activate LE on command but functionally 2/5 RLE Sensation: decreased proprioception RLE Coordination: decreased fine motor, decreased gross motor    ADLs  Overall ADL's : Needs assistance/impaired Grooming: Wash/dry hands, Moderate assistance, Sitting Upper Body Dressing : Moderate assistance, Sitting Lower Body Dressing: Maximal assistance, Sitting/lateral leans Functional mobility during ADLs: Maximal assistance, Moderate assistance, Rolling walker, Cueing for safety, Cueing for sequencing General ADL Comments: Focused session on sitting balance and RUE exercises.    Mobility  Overal bed mobility: Needs Assistance Bed Mobility: Rolling, Sidelying to Sit Rolling: Min assist Sidelying to sit: Min assist Sit to supine: Min assist Sit to sidelying: Mod assist General bed mobility comments: VC's for sequencing and attending to RUE. Assist for RUE management and trunk elevation to full sitting position.    Transfers  Overall transfer level: Needs assistance Equipment used: 2 person hand held assist, 1 person hand held assist (+ rail in hall) Transfers: Sit to/from Stand, Risk manager Sit to Stand: Min assist, +2 safety/equipment, Mod assist Stand pivot transfers: Min assist, +2 safety/equipment, Mod assist, +2 physical assistance General transfer comment: VC's for improved posture and sequencing. Therapist facilitating weight shift to be able to advance RLE for SPT.    Ambulation / Gait / Stairs / Wheelchair Mobility  Ambulation/Gait Ambulation/Gait assistance: Mod assist, +2 physical assistance, +2 safety/equipment Gait Distance (Feet): 15 Feet Assistive device: 1 person hand held assist (+ railing in hallway) Gait Pattern/deviations: Step-through pattern, Decreased stride length, Trunk  flexed General Gait Details: With railing on the L and therapist support on the R, ambulation was much more successful vs 2 person HHA. Pt was able to demonstrate a step-through pattern with more appropriate weight shifting (less on the R than L however still improved) and advancement of RLE without assist. Gait velocity: Decreased Gait velocity interpretation: <1.31 ft/sec, indicative of household ambulator    Posture / Balance Dynamic Sitting Balance Sitting balance - Comments: Tendency for lateral lean to R; reaching to hold onto bedrail tightly with L hand Balance Overall balance assessment: Needs assistance Sitting-balance support: No upper extremity supported, Feet supported Sitting balance-Leahy Scale: Fair Sitting balance - Comments: Tendency for lateral lean to R; reaching to hold onto bedrail tightly with L hand Postural control: Right lateral lean Standing balance support: Bilateral upper extremity supported, During functional activity Standing balance-Leahy Scale: Poor Standing balance comment: assist from OT/PT  Special needs/care consideration Easily frustrated due to aphasia   Previous Home Environment  Living Arrangements: Alone  Lives With: Alone Available Help at Discharge: Family, Available 24 hours/day (niece to arrange 24/7 supervision at home) Type of Home: House Home Layout: One level Home Access: Stairs to enter Entrance Stairs-Rails: Right, Left Entrance Stairs-Number of Steps: 3 Bathroom Shower/Tub: Optometrist: Yes How Accessible: Accessible via walker [place]: No Additional Comments: lives alone at baseline with family check-ins from niece  Discharge Living Setting Plans for Discharge Living Setting: Patient's home, Alone (niece to arrange 24/7 assist at discharge) Type of Home at Discharge: House Discharge Home Layout: One level Discharge Home Access: Stairs to enter Entrance  Stairs-Rails: Right, Left Entrance Stairs-Number of Steps: 3 Discharge Bathroom Shower/Tub: Tub/shower unit Discharge Bathroom Toilet: Standard Discharge Bathroom Accessibility: Yes How Accessible: Accessible via walker Does the patient have any problems obtaining your medications?: No  Social/Family/Support Systems Contact Information: [name], niece and POA; 2 sons are not involved in her care Anticipated Caregiver: Niece and caregivers hired by Niece Ability/Limitations of Caregiver: Niece works but is very involved in her care and needs Caregiver Availability: 24/7 Discharge Plan Discussed with Primary Caregiver: Yes Is Caregiver In Agreement with Plan?: Yes Does Caregiver/Family have Issues with Lodging/Transportation while Pt is in Rehab?: No Niece is POA  Goals Patient/Family Goal for Rehab: supervision to min with PT, OT and SLP Expected length of stay: ELOS 2 to 3 weeks Pt/Family Agrees to Admission and willing to participate: Yes Program Orientation Provided & Reviewed with Pt/Caregiver Including Roles  & Responsibilities: Yes  Decrease burden of Care through IP rehab admission: n/a  Possible need for SNF placement upon discharge: not anticipated  Patient Condition: I have reviewed medical records from [facility] , spoken with CM, and patient and family member. I met with patient at the bedside for inpatient rehabilitation assessment.  Patient will benefit from ongoing PT, OT, and SLP, can actively participate in 3 hours of therapy a day 5 days of the week, and can make measurable gains during the admission.  Patient will also benefit from the coordinated team approach during an Inpatient Acute Rehabilitation admission.  The patient will receive intensive therapy as well as Rehabilitation physician, nursing, social worker, and care management interventions.  Due to bladder management, bowel management, safety, skin/wound care, disease management, medication  administration, pain management, and patient education the patient requires 24 hour a day rehabilitation nursing.  The patient is currently min to mod assist overall with mobility and basic ADLs.  Discharge setting and therapy post discharge at home with home health is anticipated.  Patient has agreed to participate in the Acute Inpatient Rehabilitation Program and will admit today.  Preadmission Screen Completed By:  Cleatrice Burke, 08/05/2020 1:48 PM ______________________________________________________________________   Discussed status with Dr. Letta Pate on  08/05/2020 at  1335 and received approval for admission today.  Admission Coordinator:  Cleatrice Burke, RN, time  5631 Date 08/05/2020   Assessment/Plan: Diagnosis:Left ACA infarct Does the need for close, 24 hr/day Medical supervision in concert with the patient's rehab needs make it unreasonable for this patient to be served in a less intensive setting? Yes Co-Morbidities requiring supervision/potential complications: Afib on anticoagulation, CAD, s/p NSTEMI, HTN Due to bladder management, bowel management, safety, skin/wound care, disease management, medication administration, pain management, and patient education, does the patient require 24 hr/day rehab nursing? Yes Does the patient require coordinated care of a  physician, rehab nurse, PT, OT, and SLP to address physical and functional deficits in the context of the above medical diagnosis(es)? Yes Addressing deficits in the following areas: balance, endurance, locomotion, strength, transferring, bowel/bladder control, bathing, dressing, feeding, grooming, toileting, cognition, and psychosocial support Can the patient actively participate in an intensive therapy program of at least 3 hrs of therapy 5 days a week? Yes The potential for patient to make measurable gains while on inpatient rehab is good Anticipated functional outcomes upon discharge from inpatient rehab:  supervision and min assist PT, supervision and min assist OT, min assist SLP Estimated rehab length of stay to reach the above functional goals is: 2-3 wks Anticipated discharge destination: Home 10. Overall Rehab/Functional Prognosis: good   MD Signature: Charlett Blake M.D. [facility] Fellow Am Acad of Phys Med and Rehab Diplomate Am Board of Electrodiagnostic Med Fellow Am Board of Interventional Pain

## 2020-08-04 NOTE — Progress Notes (Signed)
STROKE TEAM PROGRESS NOTE   INTERVAL HISTORY Niece at bedside, patient lying in bed, neuro stable, still has mild right arm weakness but 3/5.  No neuro changes or chest pain, will resume Xarelto, continue Plavix and discontinue aspirin.  PT/OT recommend CIR.  Vitals:   08/04/20 0344 08/04/20 0735 08/04/20 1200 08/04/20 1959  BP: (!) 149/76 (!) 171/73 (!) 134/50 (!) 139/94  Pulse: 77 80 78 94  Resp: 18  19 18   Temp: 98.1 F (36.7 C) 97.7 F (36.5 C) (!) 97.3 F (36.3 C) 97.7 F (36.5 C)  TempSrc: Oral Oral Oral Oral  SpO2: 97% 99% 91% 99%  Weight:      Height:       CBC:  Recent Labs  Lab 07/31/20 1304 07/31/20 1309 08/03/20 0415 08/04/20 0329  WBC 9.7   < > 6.8 6.4  NEUTROABS 8.4*  --   --   --   HGB 13.8   < > 12.7 12.9  HCT 43.6   < > 39.9 40.5  MCV 90.8   < > 90.1 90.2  PLT 243   < > 196 220   < > = values in this interval not displayed.   Basic Metabolic Panel:  Recent Labs  Lab 07/31/20 1304 07/31/20 1309 08/03/20 0415 08/04/20 0329  NA 137   < > 138 136  K 4.5   < > 3.5 3.4*  CL 103   < > 102 99  CO2 23   < > 29 29  GLUCOSE 123*   < > 101* 100*  BUN 21   < > 17 18  CREATININE 1.05*   < > 0.90 0.88  CALCIUM 9.2   < > 8.6* 8.5*  MG 2.2  --   --   --   PHOS 4.5  --   --   --    < > = values in this interval not displayed.    Lipid Panel:  Recent Labs  Lab 08/02/20 0246  CHOL 199  TRIG 79  HDL 53  CHOLHDL 3.8  VLDL 16  LDLCALC 10/03/20*    HgbA1c:  Recent Labs  Lab 08/02/20 0246  HGBA1C 6.0*   Urine Drug Screen: No results for input(s): LABOPIA, COCAINSCRNUR, LABBENZ, AMPHETMU, THCU, LABBARB in the last 168 hours.  Alcohol Level No results for input(s): ETH in the last 168 hours.  IMAGING past 24 hours No results found.  PHYSICAL EXAM:  Head and face - right side of face and neck as well as periorbital area ecchymosis due to fall.    Neuro - Patient awake, alert, eyes open, good eye contact, however psychomotor slowing, orientated to place  and people, however not orientated to age or time.  Able to repeat and name 3/4 with mild dysarthria. No gaze palsy, tracking bilaterally, blinking to visual threat bilaterally.  Mild right facial droop. Tongue midline.  Left upper extremity at least 4/5, right upper extremity 3 -/5, right lower extremity 3-/5 proximal and 2/5 distal.  Left lower extremity 3+/5.  Sensation symmetrical bilaterally, left FTN intact, gait not tested.  ASSESSMENT/PLAN  Ellen Barajas is a 87 y.o. female with PMH significant for CAD, permanent Afibb on Xarelto, HLD, MI, post herpetic neuralgia who is admitted with fall and confusion. She was found to have elevated troponins and NSTEMI. During hospitalization she developed right facial droop, right hemiparesis and expressive for which code stroke was activated.    Stroke:  moderate sized left MCA infarct  likely due  to non-STEMI with low EF. Patient does have history of A. fib on Xarelto, therefore can not rule out A. fib as potential stroke etiology but less likely in this context.   CT head initial No acute abnormality. ASPECTS 10.     CT repeat showed left MCA moderate sized infarct.   CTA head and neck right M1, left M2 stenosis, and left V4 occlusion with distal retrograde flow.   CT perfusion negative.   MRI showed moderate sized left MCA infarct.   EF 25 to 30%, down from 55 to 60% in 09/2019.   LDL 140 HgbA1c 6.0 VTE prophylaxis -Lovenox Xarelto (rivaroxaban) daily prior to admission, now on Plavix and Xarelto.  Continue on discharge Therapy recommendations:  CIR Disposition:  pending  Non-STEMI History of CAD/MI Cardiology on board Likely the cause for patient fall/syncope EF 25 to 30% down from 55 to 60% in 09/2019 No intervention needed at this time given no CP Now on Plavix, will resume Xarelto. D/c ASA  A. Fib On Xarelto PTA Currently on Plavix, discontinue aspirin Resume Xarelto.  Hypertension Home meds:  tenormin 25mg  daily, losartan 25mg  daily   Stable Long-term BP goal normotensive  Hyperlipidemia Home meds: None LDL 140, goal < 70  On lipitor 40mg  daily Continue statin on discharge   Other Stroke Risk Factors  Advanced Age >/= 9   Other Active Problems Fall with facial ecchymosis  Hospital day # 4  Neurology will sign off. Please call with questions. Pt will follow up with stroke clinic NP at [facility] in about 4 weeks. Thanks for the consult.   , MD PhD Stroke Neurology 08/04/2020 11:25 PM   To contact Stroke Continuity provider, please refer to [facility]. After hours, contact General Neurology

## 2020-08-05 ENCOUNTER — Inpatient Hospital Stay (HOSPITAL_COMMUNITY)
Admission: RE | Admit: 2020-08-05 | Discharge: 2020-08-20 | DRG: 056 | Disposition: A | Discharge status: Hospice | Payer: Medicare Other | Source: Intra-hospital | Attending: Physical Medicine and Rehabilitation | Admitting: Physical Medicine and Rehabilitation

## 2020-08-05 ENCOUNTER — Other Ambulatory Visit: Payer: Self-pay

## 2020-08-05 DIAGNOSIS — R32 Unspecified urinary incontinence: Secondary | ICD-10-CM | POA: Diagnosis not present

## 2020-08-05 DIAGNOSIS — I671 Cerebral aneurysm, nonruptured: Secondary | ICD-10-CM | POA: Diagnosis present

## 2020-08-05 DIAGNOSIS — I69351 Hemiplegia and hemiparesis following cerebral infarction affecting right dominant side: Secondary | ICD-10-CM | POA: Diagnosis not present

## 2020-08-05 DIAGNOSIS — Z66 Do not resuscitate: Secondary | ICD-10-CM | POA: Diagnosis present

## 2020-08-05 DIAGNOSIS — H9190 Unspecified hearing loss, unspecified ear: Secondary | ICD-10-CM | POA: Diagnosis present

## 2020-08-05 DIAGNOSIS — S0240ED Zygomatic fracture, right side, subsequent encounter for fracture with routine healing: Secondary | ICD-10-CM

## 2020-08-05 DIAGNOSIS — S02841D Fracture of lateral orbital wall, right side, subsequent encounter for fracture with routine healing: Secondary | ICD-10-CM | POA: Diagnosis not present

## 2020-08-05 DIAGNOSIS — I6612 Occlusion and stenosis of left anterior cerebral artery: Secondary | ICD-10-CM

## 2020-08-05 DIAGNOSIS — I2510 Atherosclerotic heart disease of native coronary artery without angina pectoris: Secondary | ICD-10-CM | POA: Diagnosis present

## 2020-08-05 DIAGNOSIS — I4819 Other persistent atrial fibrillation: Secondary | ICD-10-CM | POA: Diagnosis present

## 2020-08-05 DIAGNOSIS — Z888 Allergy status to other drugs, medicaments and biological substances status: Secondary | ICD-10-CM

## 2020-08-05 DIAGNOSIS — I5022 Chronic systolic (congestive) heart failure: Secondary | ICD-10-CM | POA: Diagnosis present

## 2020-08-05 DIAGNOSIS — Z9841 Cataract extraction status, right eye: Secondary | ICD-10-CM

## 2020-08-05 DIAGNOSIS — I10 Essential (primary) hypertension: Secondary | ICD-10-CM | POA: Diagnosis not present

## 2020-08-05 DIAGNOSIS — I214 Non-ST elevation (NSTEMI) myocardial infarction: Secondary | ICD-10-CM | POA: Diagnosis present

## 2020-08-05 DIAGNOSIS — Z9842 Cataract extraction status, left eye: Secondary | ICD-10-CM

## 2020-08-05 DIAGNOSIS — I639 Cerebral infarction, unspecified: Secondary | ICD-10-CM | POA: Diagnosis not present

## 2020-08-05 DIAGNOSIS — R4182 Altered mental status, unspecified: Secondary | ICD-10-CM | POA: Diagnosis not present

## 2020-08-05 DIAGNOSIS — I69391 Dysphagia following cerebral infarction: Secondary | ICD-10-CM

## 2020-08-05 DIAGNOSIS — I110 Hypertensive heart disease with heart failure: Secondary | ICD-10-CM | POA: Diagnosis present

## 2020-08-05 DIAGNOSIS — R531 Weakness: Secondary | ICD-10-CM | POA: Diagnosis not present

## 2020-08-05 DIAGNOSIS — Z79899 Other long term (current) drug therapy: Secondary | ICD-10-CM

## 2020-08-05 DIAGNOSIS — Z8249 Family history of ischemic heart disease and other diseases of the circulatory system: Secondary | ICD-10-CM

## 2020-08-05 DIAGNOSIS — Z951 Presence of aortocoronary bypass graft: Secondary | ICD-10-CM | POA: Diagnosis not present

## 2020-08-05 DIAGNOSIS — I259 Chronic ischemic heart disease, unspecified: Secondary | ICD-10-CM

## 2020-08-05 DIAGNOSIS — I252 Old myocardial infarction: Secondary | ICD-10-CM | POA: Diagnosis not present

## 2020-08-05 DIAGNOSIS — I69322 Dysarthria following cerebral infarction: Secondary | ICD-10-CM | POA: Diagnosis not present

## 2020-08-05 DIAGNOSIS — S0240CD Maxillary fracture, right side, subsequent encounter for fracture with routine healing: Secondary | ICD-10-CM

## 2020-08-05 DIAGNOSIS — I69320 Aphasia following cerebral infarction: Secondary | ICD-10-CM

## 2020-08-05 DIAGNOSIS — R482 Apraxia: Secondary | ICD-10-CM | POA: Diagnosis not present

## 2020-08-05 DIAGNOSIS — S0219XD Other fracture of base of skull, subsequent encounter for fracture with routine healing: Secondary | ICD-10-CM | POA: Diagnosis not present

## 2020-08-05 DIAGNOSIS — I69390 Apraxia following cerebral infarction: Secondary | ICD-10-CM

## 2020-08-05 DIAGNOSIS — Z961 Presence of intraocular lens: Secondary | ICD-10-CM | POA: Diagnosis present

## 2020-08-05 DIAGNOSIS — Z515 Encounter for palliative care: Secondary | ICD-10-CM | POA: Diagnosis not present

## 2020-08-05 DIAGNOSIS — W19XXXD Unspecified fall, subsequent encounter: Secondary | ICD-10-CM | POA: Diagnosis present

## 2020-08-05 DIAGNOSIS — I6621 Occlusion and stenosis of right posterior cerebral artery: Secondary | ICD-10-CM | POA: Diagnosis not present

## 2020-08-05 DIAGNOSIS — I63512 Cerebral infarction due to unspecified occlusion or stenosis of left middle cerebral artery: Secondary | ICD-10-CM | POA: Diagnosis present

## 2020-08-05 DIAGNOSIS — R1310 Dysphagia, unspecified: Secondary | ICD-10-CM | POA: Diagnosis present

## 2020-08-05 DIAGNOSIS — E785 Hyperlipidemia, unspecified: Secondary | ICD-10-CM | POA: Diagnosis present

## 2020-08-05 DIAGNOSIS — I482 Chronic atrial fibrillation, unspecified: Secondary | ICD-10-CM | POA: Diagnosis not present

## 2020-08-05 DIAGNOSIS — Z7901 Long term (current) use of anticoagulants: Secondary | ICD-10-CM | POA: Diagnosis not present

## 2020-08-05 DIAGNOSIS — Z7189 Other specified counseling: Secondary | ICD-10-CM | POA: Diagnosis not present

## 2020-08-05 DIAGNOSIS — S0292XA Unspecified fracture of facial bones, initial encounter for closed fracture: Secondary | ICD-10-CM | POA: Diagnosis not present

## 2020-08-05 LAB — GLUCOSE, CAPILLARY: Glucose-Capillary: 167 mg/dL — ABNORMAL HIGH (ref 70–99)

## 2020-08-05 LAB — COMPREHENSIVE METABOLIC PANEL
ALT: 77 U/L — ABNORMAL HIGH (ref 0–44)
AST: 62 U/L — ABNORMAL HIGH (ref 15–41)
Albumin: 3.5 g/dL (ref 3.5–5.0)
Alkaline Phosphatase: 110 U/L (ref 38–126)
Anion gap: 11 (ref 5–15)
BUN: 19 mg/dL (ref 8–23)
CO2: 26 mmol/L (ref 22–32)
Calcium: 9 mg/dL (ref 8.9–10.3)
Chloride: 99 mmol/L (ref 98–111)
Creatinine, Ser: 0.93 mg/dL (ref 0.44–1.00)
GFR, Estimated: 59 mL/min — ABNORMAL LOW (ref >60)
Glucose, Bld: 106 mg/dL — ABNORMAL HIGH (ref 70–99)
Potassium: 3.2 mmol/L — ABNORMAL LOW (ref 3.5–5.1)
Sodium: 136 mmol/L (ref 135–145)
Total Bilirubin: 1.1 mg/dL (ref 0.3–1.2)
Total Protein: 6.7 g/dL (ref 6.5–8.1)

## 2020-08-05 LAB — CBC
HCT: 45.3 % (ref 36.0–46.0)
Hemoglobin: 14.2 g/dL (ref 12.0–15.0)
MCH: 28.4 pg (ref 26.0–34.0)
MCHC: 31.3 g/dL (ref 30.0–36.0)
MCV: 90.6 fL (ref 80.0–100.0)
Platelets: 281 10*3/uL (ref 150–400)
RBC: 5 MIL/uL (ref 3.87–5.11)
RDW: 14.3 % (ref 11.5–15.5)
WBC: 8 10*3/uL (ref 4.0–10.5)
nRBC: 0 % (ref 0.0–0.2)

## 2020-08-05 MED ORDER — POLYETHYLENE GLYCOL 3350 17 G PO PACK: 17.0000 g | PACK | Freq: Every day | ORAL | 0 refills | Status: DC | PRN

## 2020-08-05 MED ORDER — ACETAMINOPHEN 325 MG PO TABS: 650.0000 mg | ORAL_TABLET | Freq: Four times a day (QID) | ORAL | 0 refills | Status: DC | PRN

## 2020-08-05 MED ORDER — METOPROLOL TARTRATE 25 MG PO TABS
37.5000 mg | ORAL_TABLET | Freq: Two times a day (BID) | ORAL | Status: DC
  Administered 2020-08-06 – 2020-08-07 (×4): 37.5 mg via ORAL
  Filled 2020-08-05 (×5): qty 1

## 2020-08-05 MED ORDER — BISACODYL 10 MG RE SUPP: 10.0000 mg | Freq: Every day | RECTAL | Status: DC | PRN

## 2020-08-05 MED ORDER — POTASSIUM CHLORIDE CRYS ER 20 MEQ PO TBCR
40.0000 meq | EXTENDED_RELEASE_TABLET | Freq: Two times a day (BID) | ORAL | Status: DC
  Administered 2020-08-05: 40 meq via ORAL
  Filled 2020-08-05: qty 2

## 2020-08-05 MED ORDER — ALBUTEROL SULFATE (2.5 MG/3ML) 0.083% IN NEBU: 2.5000 mg | INHALATION_SOLUTION | RESPIRATORY_TRACT | Status: DC | PRN

## 2020-08-05 MED ORDER — CEPHALEXIN 500 MG PO CAPS: 500.0000 mg | ORAL_CAPSULE | Freq: Three times a day (TID) | ORAL | 0 refills | Status: DC

## 2020-08-05 MED ORDER — POTASSIUM CHLORIDE CRYS ER 20 MEQ PO TBCR
40.0000 meq | EXTENDED_RELEASE_TABLET | Freq: Two times a day (BID) | ORAL | Status: AC
  Filled 2020-08-05 (×2): qty 2

## 2020-08-05 MED ORDER — CEPHALEXIN 250 MG PO CAPS
500.0000 mg | ORAL_CAPSULE | Freq: Three times a day (TID) | ORAL | Status: AC
  Administered 2020-08-06 – 2020-08-07 (×3): 500 mg via ORAL
  Filled 2020-08-05 (×5): qty 2

## 2020-08-05 MED ORDER — BISACODYL 10 MG RE SUPP: 10.0000 mg | Freq: Every day | RECTAL | 0 refills | Status: DC | PRN

## 2020-08-05 MED ORDER — ACETAMINOPHEN 325 MG PO TABS: 650.0000 mg | ORAL_TABLET | Freq: Four times a day (QID) | ORAL | Status: DC | PRN

## 2020-08-05 MED ORDER — ATORVASTATIN CALCIUM 40 MG PO TABS
40.0000 mg | ORAL_TABLET | Freq: Every day | ORAL | Status: DC
  Administered 2020-08-06 – 2020-08-20 (×15): 40 mg via ORAL
  Filled 2020-08-05 (×15): qty 1

## 2020-08-05 MED ORDER — CLOPIDOGREL BISULFATE 75 MG PO TABS
75.0000 mg | ORAL_TABLET | Freq: Every day | ORAL | Status: DC
  Administered 2020-08-06 – 2020-08-20 (×15): 75 mg via ORAL
  Filled 2020-08-05 (×15): qty 1

## 2020-08-05 MED ORDER — METOPROLOL TARTRATE 37.5 MG PO TABS: 37.5000 mg | ORAL_TABLET | Freq: Two times a day (BID) | ORAL | 0 refills | Status: DC

## 2020-08-05 MED ORDER — FUROSEMIDE 20 MG PO TABS: 20.0000 mg | ORAL_TABLET | Freq: Every day | ORAL | 0 refills | Status: DC

## 2020-08-05 MED ORDER — ATORVASTATIN CALCIUM 40 MG PO TABS: 40.0000 mg | ORAL_TABLET | Freq: Every day | ORAL | 0 refills | Status: DC

## 2020-08-05 MED ORDER — POLYETHYLENE GLYCOL 3350 17 G PO PACK: 17.0000 g | PACK | Freq: Every day | ORAL | Status: DC | PRN

## 2020-08-05 MED ORDER — LOSARTAN POTASSIUM 50 MG PO TABS
25.0000 mg | ORAL_TABLET | Freq: Every day | ORAL | Status: DC
  Administered 2020-08-06 – 2020-08-20 (×15): 25 mg via ORAL
  Filled 2020-08-05 (×15): qty 1

## 2020-08-05 MED ORDER — FUROSEMIDE 20 MG PO TABS
20.0000 mg | ORAL_TABLET | Freq: Every day | ORAL | Status: DC
  Administered 2020-08-06 – 2020-08-20 (×15): 20 mg via ORAL
  Filled 2020-08-05 (×15): qty 1

## 2020-08-05 MED ORDER — RIVAROXABAN 15 MG PO TABS
15.0000 mg | ORAL_TABLET | Freq: Every day | ORAL | Status: DC
  Administered 2020-08-06 – 2020-08-19 (×13): 15 mg via ORAL
  Filled 2020-08-05 (×16): qty 1

## 2020-08-05 MED ORDER — METOPROLOL TARTRATE 25 MG PO TABS: 37.5000 mg | ORAL_TABLET | Freq: Two times a day (BID) | ORAL | Status: DC

## 2020-08-05 MED ORDER — CLOPIDOGREL BISULFATE 75 MG PO TABS: 75.0000 mg | ORAL_TABLET | Freq: Every day | ORAL | 0 refills | Status: DC

## 2020-08-05 NOTE — Progress Notes (Signed)
  Speech Language Pathology Treatment: Dysphagia;Cognitive-Linquistic  Patient Details Name: Ellen Barajas MRN: [id number] DOB: [birth year] Today's Date: 08/05/2020 Time: 4098-1191 SLP Time Calculation (min) (ACUTE ONLY): 23 min  Assessment / Plan / Recommendation Clinical Impression  Therapist assisted pt during lunch with swallow strategies. She is able to self feed with left hand needing occasional assist to hold utensil and coordinate scooping. She could not comprehend and carry over for small bites. She had two hard coughs during session in between bites and consistencies with suspicion for possible airway intrusion however not concerned she is consistently aspiration at present time. RN and RN tech report they have not noted coughing.   She was unable to name common objects on lunch tray when provided with phrase completion, phonemic or orthographic cues. She read phrases accurately without difficulty but evidence of reduced comprehension.    HPI HPI: 87 year old female admitted s/p fall with confusion. Dx with NSTEMI on 7/8. On 7/9, patient with acute onset aphasia, right sided weakness and facial droop. Head CT with Moderate acute infarct in the parasagittal left frontal lobe, ACA territory.      SLP Plan  Continue with current plan of care       Recommendations  Diet recommendations: Regular;Thin liquid Liquids provided via: Straw;Cup Medication Administration: Whole meds with puree Supervision: Patient able to self feed;Staff to assist with self feeding;Full supervision/cueing for compensatory strategies Compensations: Slow rate;Small sips/bites Postural Changes and/or Swallow Maneuvers: Seated upright 90 degrees                General recommendations: Rehab consult Oral Care Recommendations: Oral care BID Follow up Recommendations: Inpatient Rehab SLP Visit Diagnosis: Aphasia (R47.01);Dysphagia, unspecified (R13.10) Plan: Continue with current plan of care                        Royce Macadamia 08/05/2020, 1:39 PM  Breck Coons Lonell Face.Ed Nurse, children's [phone] Office [phone]

## 2020-08-05 NOTE — Progress Notes (Signed)
Inpatient Rehabilitation Admissions Coordinator   I have insurance approval to admit to CIR and now await bed availability over the next 48 hrs . I spoke with her niece by phone and she is aware .  Ottie Glazier, RN, MSN Rehab Admissions Coordinator [phone] 08/05/2020 10:32 AM

## 2020-08-05 NOTE — H&P (Signed)
Physical Medicine and Rehabilitation Admission H&P         Chief Complaint  Patient presents with   Fall      Pt arrived via [place], lives at home alone and family had not been able to reach her. Pt was found on floor today in her bedroom.  She has bruising to right side of face and eye. EMST reported right facial droop, right sided weakness with neglect.  Pt is able to follow commands and has no drifts to either extremities on assessment on arrival to ED. Pt remembers knowing it was dark when she fell, then saw light, then it got dark again, and then saw light this am. There were blood thinner bottles of warfar   Altered Mental Status      Pt arrived via [facility], lives at home alone and family had not been able to reach her. Pt was found on floor today in her bedroom.  She has bruising to right side of face and eye. EMST reported right facial droop, right sided weakness with neglect.  Pt is able to follow commands and has no drifts to either extremities on assessment on arrival to ED. Pt remembers knowing it was dark when she fell, then saw light, then it got dark again, and then saw light this am. There were blood thinner bottles of warfar  : HPI: Ellen Barajas is a 87 year old right-handed female with history of atrial fibrillation on Xarelto, CAD with CABG 2002, postherpetic neuralgia, hypertension, hyperlipidemia as well as history of hip fracture 2020.  Per chart review patient lives alone.  Independent with assistive device.  She does not drive.  Niece and family assist as needed.  Presents 07/31/2020 after being found down with acute onset of right-sided weakness facial droop and aphasia.  She did have bruising to the right side of her face and eye.  Cranial CT scan negative for acute changes.  CT cervical spine as well as maxillofacial showed mildly displaced fractures of the right zygomatic arch, anterior and posterior lateral walls of the right maxillary sinus and possible diastasis of the right  frontal zygomatic suture and conservative care was recommended and placed on empiric Keflex per ENT DrJuengel.  No other facial fractures.  Overlying right facial soft tissue swelling without orbital hematoma or foreign body.  CT cervical spine negative.  CT angiogram head and neck negative for large vessel occlusion.  There was a 2 mm A -comm aneurysm.  Patient did not receive tPA.  MRI identified moderate to large acute left ACA branch infarction.  Tiny acute infarct in the left anterior temporal cortex.  Echocardiogram with ejection fraction of 25 to 30%.  The left ventricle had severely decreased function.  Admission chemistries unremarkable except creatinine 1.05, troponin [id number], CK 731, lactic acid 1.9.  Cardiology service was consulted for elevated troponin.  EKG overall unchanged from previous tracings and no ischemic changes.  It was felt elevated troponin in the setting of fall and being down for an extended amount of time.  Awaiting plan for possible need for 30-day cardiac event monitor.  Patient was cleared to continue chronic Xarelto and Plavix was added for history of CAD/non-STEMI.  No further work-up at this time was indicated.  Close monitoring of heart rate with history of atrial fibrillation and adjustments made in beta-blocker.  Patient is tolerating a regular consistency diet.  Therapy evaluations completed due to patient's right side weakness and aphasia was admitted for a comprehensive rehab program.  Review of Systems Constitutional:  Negative for chills and fever. HENT:  Negative for hearing loss.   Eyes:  Negative for blurred vision and double vision. Cardiovascular:  Positive for palpitations. Gastrointestinal:  Positive for constipation. Negative for heartburn, nausea and vomiting. Genitourinary:  Negative for dysuria, flank pain and hematuria. Musculoskeletal:  Positive for falls, joint pain and myalgias. Skin:  Negative for rash. Neurological:  Positive for speech  change and weakness.  All other systems reviewed and are negative.     Past Medical History:  Diagnosis Date   AF (atrial fibrillation) (HCC)      coumadin   Arthritis      "hands" (09/27/2017)   Coronary artery disease 2002    s/p CABG   Diverticulosis     Dyslipidemia     History of chicken pox     IHD (ischemic heart disease)     Myocardial infarction (HCC) ~ 2002   Post herpetic neuralgia 02/2012         Past Surgical History:  Procedure Laterality Date   BREAST LUMPECTOMY Left 1992    excision of fibrous tumor/notes 06/19/2000   CARDIAC CATHETERIZATION   06/08/2000   CATARACT EXTRACTION W/ INTRAOCULAR LENS  IMPLANT, BILATERAL Bilateral 2013   CORONARY ARTERY BYPASS GRAFT   2002    x 4   EYE SURGERY Bilateral 1989   FEMUR IM NAIL Left 09/28/2017    Procedure: INTRAMEDULLARY (IM) NAIL FEMORAL;  Surgeon: Sheral Apley, MD;  Location: [facility] OR;  Service: Orthopedics;  Laterality: Left;   INTRAMEDULLARY (IM) NAIL INTERTROCHANTERIC Right 11/22/2018    Procedure: INTRAMEDULLARY (IM) NAIL INTERTROCHANTRIC;  Surgeon: Sheral Apley, MD;  Location: [facility] OR;  Service: Orthopedics;  Laterality: Right;         Family History  Problem Relation Age of Onset   CAD Father 37        MI   CAD Mother 92        MI    Social History:  reports that she has never smoked. She has never used smokeless tobacco. She reports that she does not drink alcohol and does not use drugs. Allergies:       Allergies  Allergen Reactions   Crestor [Rosuvastatin Calcium] Other (See Comments)      unknown   Lipitor [Atorvastatin Calcium] Other (See Comments)      unknown   Niaspan [Niacin Er] Other (See Comments)      unknown   Other Other (See Comments)      PER PATIENT SHE DOES NOT TOLERATE NARCOTICS   Pravastatin Other (See Comments)      unknown   Ramipril [Ramipril] Other (See Comments)      Hyperkalemia   Welchol [Colesevelam Hcl] Other (See Comments)      unknown   Zetia [Ezetimibe] Other  (See Comments)      unknown          Medications Prior to Admission  Medication Sig Dispense Refill   atenolol (TENORMIN) 25 MG tablet TAKE 1 TABLET BY MOUTH DAILY (Patient taking differently: Take 25 mg by mouth daily.) 90 tablet 2   losartan (COZAAR) 25 MG tablet Take 1 tablet by mouth once daily (Patient taking differently: Take 25 mg by mouth daily.) 90 tablet 3   Rivaroxaban (XARELTO) 15 MG TABS tablet Take 1 tablet (15 mg total) by mouth daily with supper. 30 tablet 5   polyethylene glycol (MIRALAX / GLYCOLAX) 17 g packet Take 17 g by mouth daily  as needed for mild constipation. (Patient not taking: Reported on 07/31/2020) 6 each 0      Drug Regimen Review Drug regimen was reviewed and remains appropriate with no significant issues identified   Home: Home Living Family/patient expects to be discharged to:: Inpatient rehab Living Arrangements: Alone Available Help at Discharge: Family, Available 24 hours/day (niece to arrange 24/7 supervision at home) Type of Home: House Home Access: Stairs to enter [employer] of Steps: 3 Entrance Stairs-Rails: Right, Left Home Layout: One level Bathroom Shower/Tub: Armed forces operational officer Accessibility: Yes Additional Comments: lives alone at baseline with family check-ins from niece  Lives With: Alone   Functional History: Prior Function Level of Independence: Independent with assistive device(s) Comments: uses quad cane for ambulation, does not drive. Neice brings grocery.  Patient continued to be independent for ADL completion, med management, meal prep and home management.  Niece assists with bill payment and community mobility.   Functional Status:  Mobility: Bed Mobility Overal bed mobility: Needs Assistance Bed Mobility: Rolling, Sidelying to Sit, Sit to Sidelying Rolling: Min assist Sidelying to sit: Min assist Sit to supine: Min assist Sit to sidelying: Mod assist General bed mobility  comments: VC's for sequencing and attending to RUE. Assist for RUE management and trunk elevation to full sitting position. Transfers Overall transfer level: Needs assistance Equipment used: 2 person hand held assist Transfers: Sit to/from Stand, Stand Pivot Transfers Sit to Stand: Min assist, +2 safety/equipment, Mod assist Stand pivot transfers: Min assist, +2 safety/equipment, Mod assist, +2 physical assistance General transfer comment: VC's for improved posture and sequencing. Therapist facilitating weight shift to be able to advance RLE. Ambulation/Gait Ambulation/Gait assistance: Min assist, Mod assist, +2 physical assistance, +2 safety/equipment Gait Distance (Feet): 8 Feet Assistive device: 2 person hand held assist Gait Pattern/deviations: Step-through pattern, Decreased stride length, Trunk flexed General Gait Details: Facilitation of weight shift and advancing LE's during gait. Pt was able to swing through on R however had increased difficulty bearing weight without knee buckle. Gait velocity: Decreased Gait velocity interpretation: <1.31 ft/sec, indicative of household ambulator   ADL: ADL Overall ADL's : Needs assistance/impaired Grooming: Wash/dry hands, Moderate assistance, Sitting Upper Body Dressing : Moderate assistance, Sitting Lower Body Dressing: Maximal assistance, Sitting/lateral leans Functional mobility during ADLs: Maximal assistance, Moderate assistance, Rolling walker, Cueing for safety, Cueing for sequencing General ADL Comments: Focused session on sitting balance and RUE exercises.   Cognition: Cognition Overall Cognitive Status: Impaired/Different from baseline Arousal/Alertness: Awake/alert Orientation Level: Oriented to person, Disoriented to place, Disoriented to time, Disoriented to situation Cognition Arousal/Alertness: Awake/alert Behavior During Therapy: WFL for tasks assessed/performed Overall Cognitive Status: Impaired/Different from  baseline Area of Impairment: Following commands Following Commands: Follows one step commands consistently, Follows one step commands with increased time General Comments: Pt following one step commands inconsistently; benefits from visual cues. Agreeable to exercises and sitting at EOB; very motivated despite cognitive deficits. Difficult to assess due to: Impaired communication   Physical Exam: Blood pressure 129/77, pulse (!) 105, temperature 97.7 F (36.5 C), temperature source Oral, resp. rate 16, height  (1.6 m), weight 68.4 kg, SpO2 99 %. Physical Exam Neurological:    Comments: Patient is alert no acute distress.  Makes eye contact with examiner.  Provides name but needed some assistance with age and time.  Mildly dysarthric with some halting speech.  Follows simple commands.    General: No acute distress Mood and affect are appropriate Heart: Regular rate and rhythm no rubs  murmurs or extra sounds Lungs: Clear to auscultation, breathing unlabored, no rales or wheezes Abdomen: Positive bowel sounds, soft nontender to palpation, nondistended Extremities: No clubbing, cyanosis, or edema Skin: No evidence of breakdown, no evidence of rash Neurologic: Cranial nerves II through XII intact, motor strength not tested due to severe aphasia and apraxia, spontaneous movement of LUE and LLE but not on RIght side   Sensory exam difficult to assess due to aphasia but has intact sensation to pinch in BUE and BLE Cerebellar exam normal finger to nose to finger as well as heel to shin in bilateral upper and lower extremities Musculoskeletal: Full range of motion in all 4 extremities. No joint swelling    Lab Results Last 48 Hours        Results for orders placed or performed during the hospital encounter of 07/31/20 (from the past 48 hour(s))  CBC     Status: None    Collection Time: 08/04/20  3:29 AM  Result Value Ref Range    WBC 6.4 4.0 - 10.5 K/uL    RBC 4.49 3.87 - 5.11 MIL/uL     Hemoglobin 12.9 12.0 - 15.0 g/dL    HCT 26.3 78.5 - 88.5 %    MCV 90.2 80.0 - 100.0 fL    MCH 28.7 26.0 - 34.0 pg    MCHC 31.9 30.0 - 36.0 g/dL    RDW 02.7 74.1 - 28.7 %    Platelets 220 150 - 400 K/uL    nRBC 0.0 0.0 - 0.2 %      Comment: Performed at [facility], [address] [place], [place] [address]  Comprehensive metabolic panel     Status: Abnormal    Collection Time: 08/04/20  3:29 AM  Result Value Ref Range    Sodium 136 135 - 145 mmol/L    Potassium 3.4 (L) 3.5 - 5.1 mmol/L    Chloride 99 98 - 111 mmol/L    CO2 29 22 - 32 mmol/L    Glucose, Bld 100 (H) 70 - 99 mg/dL      Comment: Glucose reference range applies only to samples taken after fasting for at least 8 hours.    BUN 18 8 - 23 mg/dL    Creatinine, Ser 2.09 0.44 - 1.00 mg/dL    Calcium 8.5 (L) 8.9 - 10.3 mg/dL    Total Protein 5.4 (L) 6.5 - 8.1 g/dL    Albumin 2.9 (L) 3.5 - 5.0 g/dL    AST 93 (H) 15 - 41 U/L    ALT 83 (H) 0 - 44 U/L    Alkaline Phosphatase 92 38 - 126 U/L    Total Bilirubin 1.6 (H) 0.3 - 1.2 mg/dL    GFR, Estimated >47 >09 mL/min      Comment: (NOTE) Calculated using the CKD-EPI Creatinine Equation (2021)      Anion gap 8 5 - 15      Comment: Performed at [facility], [address], [place], [place] [address]  CBC     Status: None    Collection Time: 08/05/20  3:38 AM  Result Value Ref Range    WBC 8.0 4.0 - 10.5 K/uL    RBC 5.00 3.87 - 5.11 MIL/uL    Hemoglobin 14.2 12.0 - 15.0 g/dL    HCT 62.9 47.6 - 54.6 %    MCV 90.6 80.0 - 100.0 fL    MCH 28.4 26.0 - 34.0 pg    MCHC 31.3 30.0 -  36.0 g/dL    RDW 98.1 19.1 - 47.8 %    Platelets 281 150 - 400 K/uL    nRBC 0.0 0.0 - 0.2 %      Comment: Performed at [facility], [address], [place], [place] [address]  Comprehensive metabolic panel     Status: Abnormal    Collection Time: 08/05/20  3:38 AM  Result Value Ref Range    Sodium 136 135 - 145 mmol/L    Potassium 3.2 (L) 3.5 - 5.1 mmol/L    Chloride 99 98 - 111  mmol/L    CO2 26 22 - 32 mmol/L    Glucose, Bld 106 (H) 70 - 99 mg/dL      Comment: Glucose reference range applies only to samples taken after fasting for at least 8 hours.    BUN 19 8 - 23 mg/dL    Creatinine, Ser 1.30 0.44 - 1.00 mg/dL    Calcium 9.0 8.9 - 86.5 mg/dL    Total Protein 6.7 6.5 - 8.1 g/dL    Albumin 3.5 3.5 - 5.0 g/dL    AST 62 (H) 15 - 41 U/L    ALT 77 (H) 0 - 44 U/L    Alkaline Phosphatase 110 38 - 126 U/L    Total Bilirubin 1.1 0.3 - 1.2 mg/dL    GFR, Estimated 59 (L) >60 mL/min      Comment: (NOTE) Calculated using the CKD-EPI Creatinine Equation (2021)      Anion gap 11 5 - 15      Comment: Performed at [facility], [address] [place], [place] [address]      Imaging Results (Last 48 hours)  No results found.           Medical Problem List and Plan: 1.  Right side weakness with mild receptive aphasia/dysarthria secondary to moderate-sized left MCA infarction             -patient may  shower             -ELOS/Goals: 2-3 wks MinA 2.  Antithrombotics: -DVT/anticoagulation: Xarelto             -antiplatelet therapy: Plavix 75 mg daily 3. Pain Management: Tylenol as needed 4. Mood: Provide emotional support             -antipsychotic agents: N/A 5. Neuropsych: This patient is capable of making decisions on her own behalf. 6. Skin/Wound Care: Routine skin checks 7. Fluids/Electrolytes/Nutrition: Routine in and outs with follow-up chemistries 8.  Chronic atrial fibrillation.  Continue Xarelto.  Follow-up cardiology services with daily EKGs 9.  Hypertension/atrial fibrillation.  Cozaar 25 mg daily, Lopressor 37.5 mg twice daily.   10.  Chronic systolic congestive heart failure.  Lasix 20 mg daily.  Monitor for any signs of fluid overload 11.  CAD with history of CABG. follow-up cardiology services 12.  Hyperlipidemia.  Lipitor 13.  Multiple facial fractures.  Follow-up outpatient DR Elenore Rota.  Placed on empiric Keflex x7 days 14. Bowel and  Bladder incont, likely cognitive/awareness, timed toileting  Charlton Amor, PA-C 08/05/2020  "I have personally performed a face to face diagnostic evaluation of this patient.  Additionally, I have reviewed and concur with the physician assistant's documentation above." Erick Colace M.D. [facility] Fellow Am Acad of Phys Med and Rehab Diplomate Am Board of Electrodiagnostic Med Fellow Am Board of Interventional Pain  Note Details  Author Lynnae Prudengiulli, Daniel J, PA-C File Time 08/05/2020  2:54 PM  Author Type Physician Assistant Status Incomplete  Last Editor Charlton AmorAngiulli, Daniel J, PA-C Service Physical Medicine and Rehabilitation

## 2020-08-05 NOTE — Discharge Instructions (Signed)

## 2020-08-05 NOTE — Progress Notes (Signed)
Physical Therapy Treatment Patient Details Name: Ellen Barajas MRN: [id number] DOB: 01-Oct-1928 Today's Date: 08/05/2020    History of Present Illness 87 y/o female who presented on 7/8 after being found down for unknown time. Elevated troponin and NSTEMI in ED. In ED, RN found patient to have R facial droop, R arm and leg weakness, and expressive aphasia. 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 MRI confirms moderate to large acute L ACA infarct as well as a tiny acute infarct in the L anterior temporal cortex. PMH: Afib on coumadin, CAD s/p CABG, HLD, MI 2022, diverticulosis    PT Comments    Pt progressing towards physical therapy goals. Was able to perform transfers and ambulation with up tp +2 mod assist for balance support, safety, and chair follow. Recommend continued gait training with railing in hall as overall quality of gait was improved vs HHA. Session somewhat limited by incontinence of bowel and bladder. Brief utilized during session, however gait training ended early 2 second BM clean-up. Will continue to follow and progress as able per POC.     Follow Up Recommendations  CIR     Equipment Recommendations  Other (comment) (TBD by next venue of care)    Recommendations for Other Services Rehab consult     Precautions / Restrictions Precautions Precautions: Fall Precaution Comments: expressive aphasia, very hard of hearing Restrictions Weight Bearing Restrictions: No    Mobility  Bed Mobility Overal bed mobility: Needs Assistance Bed Mobility: Rolling;Sidelying to Sit Rolling: Min assist Sidelying to sit: Min assist       General bed mobility comments: VC's for sequencing and attending to RUE. Assist for RUE management and trunk elevation to full sitting position.    Transfers Overall transfer level: Needs assistance Equipment used: 2 person hand held assist;1 person hand held assist (+ rail in hall) Transfers: Sit to/from [employer] Sit to Stand:  Min assist;+2 safety/equipment;Mod assist Stand pivot transfers: Min assist;+2 safety/equipment;Mod assist;+2 physical assistance       General transfer comment: VC's for improved posture and sequencing. Therapist facilitating weight shift to be able to advance RLE for SPT.  Ambulation/Gait Ambulation/Gait assistance: Mod assist;+2 physical assistance;+2 safety/equipment Gait Distance (Feet): 15 Feet Assistive device: 1 person hand held assist (+ railing in hallway) Gait Pattern/deviations: Step-through pattern;Decreased stride length;Trunk flexed Gait velocity: Decreased Gait velocity interpretation: <1.31 ft/sec, indicative of household ambulator General Gait Details: With railing on the L and therapist support on the R, ambulation was much more successful vs 2 person HHA. Pt was able to demonstrate a step-through pattern with more appropriate weight shifting (less on the R than L however still improved) and advancement of RLE without assist.   Stairs             Wheelchair Mobility    Modified Rankin (Stroke Patients Only) Modified Rankin (Stroke Patients Only) Pre-Morbid Rankin Score: Slight disability Modified Rankin: Moderately severe disability     Balance Overall balance assessment: Needs assistance Sitting-balance support: No upper extremity supported;Feet supported Sitting balance-Leahy Scale: Fair     Standing balance support: Bilateral upper extremity supported;During functional activity Standing balance-Leahy Scale: Poor                              Cognition Arousal/Alertness: Awake/alert Behavior During Therapy: WFL for tasks assessed/performed Overall Cognitive Status: Impaired/Different from baseline Area of Impairment: Following commands;Safety/judgement;Problem solving  Following Commands: Follows one step commands consistently;Follows one step commands with increased time Safety/Judgement: Decreased  awareness of safety;Decreased awareness of deficits   Problem Solving: Slow processing;Requires verbal cues General Comments: Frequently attempts to get up during session before therapist was "Ready". Does not seem to understand that significant assistance is required on her R side and she needs assistance to get up right now. Frustrated with aphasia but does better with choices.      Exercises      General Comments        Pertinent Vitals/Pain Pain Assessment: Faces Faces Pain Scale: No hurt Pain Intervention(s): Monitored during session    Home Living                      Prior Function            PT Goals (current goals can now be found in the care plan section) Acute Rehab PT Goals Patient Stated Goal: Regain independence per Niece PT Goal Formulation: With patient/family Time For Goal Achievement: 08/15/20 Potential to Achieve Goals: Good Progress towards PT goals: Progressing toward goals    Frequency    Min 4X/week      PT Plan Current plan remains appropriate    Co-evaluation              AM-PAC PT "6 Clicks" Mobility   Outcome Measure  Help needed turning from your back to your side while in a flat bed without using bedrails?: A Little Help needed moving from lying on your back to sitting on the side of a flat bed without using bedrails?: A Little Help needed moving to and from a bed to a chair (including a wheelchair)?: A Lot Help needed standing up from a chair using your arms (e.g., wheelchair or bedside chair)?: A Lot Help needed to walk in hospital room?: A Lot Help needed climbing 3-5 steps with a railing? : Total 6 Click Score: 13    End of Session Equipment Utilized During Treatment: Gait belt;Oxygen Activity Tolerance: Patient tolerated treatment well Patient left: in chair;with call bell/phone within reach;with chair alarm set Nurse Communication: Mobility status PT Visit Diagnosis: Unsteadiness on feet (R26.81);Muscle  weakness (generalized) (M62.81);History of falling (Z91.81);Difficulty in walking, not elsewhere classified (R26.2);Other abnormalities of gait and mobility (R26.89);Hemiplegia and hemiparesis Hemiplegia - Right/Left: Right Hemiplegia - caused by: Cerebral infarction     Time: 1130-1203 PT Time Calculation (min) (ACUTE ONLY): 33 min  Charges:  $Gait Training: 8-22 mins $Therapeutic Activity: 8-22 mins                     Ellen Barajas, PT, DPT Acute Rehabilitation Services Pager: [phone] Office: [phone]    Ellen Barajas 08/05/2020, 1:42 PM

## 2020-08-05 NOTE — Care Management Important Message (Signed)
Important Message  Patient Details  Name: ANGLE DIRUSSO MRN: [id number] Date of Birth: 1928-10-15   Medicare Important Message Given:  Yes     Ivonna Kinnick Stefan Church 08/05/2020, 10:38 AM

## 2020-08-05 NOTE — Progress Notes (Signed)
Inpatient Rehabilitation Medication Review by a Pharmacist  A complete drug regimen review was completed for this patient to identify any potential clinically significant medication issues.  Clinically significant medication issues were identified:  no  Check AMION for pharmacist assigned to patient if future medication questions/issues arise during this admission.  Pharmacist comments:   Time spent performing this drug regimen review (minutes):  5 minutes   Ellen Barajas 08/05/2020 8:48 PM

## 2020-08-05 NOTE — Progress Notes (Addendum)
Progress Note  Patient Name: Ellen Barajas Date of Encounter: 08/05/2020  Primary Cardiologist: Kristeen Miss, MD  Subjective   No specific complaints today. Denies chest pain/SOB. AF rates  uncontrolled in the 130-140 range today.   Inpatient Medications    Scheduled Meds:  atorvastatin  40 mg Oral Daily   cephALEXin  500 mg Oral Q8H   clopidogrel  75 mg Oral Daily   furosemide  20 mg Oral Daily   losartan  25 mg Oral Daily   metoprolol tartrate  25 mg Oral BID   potassium chloride  40 mEq Oral BID   Rivaroxaban  15 mg Oral Q supper   Continuous Infusions:  PRN Meds: acetaminophen, albuterol, bisacodyl, ondansetron (ZOFRAN) IV, polyethylene glycol   Vital Signs    Vitals:   08/05/20 0004 08/05/20 0321 08/05/20 0724 08/05/20 0730  BP: (!) 143/59 (!) 161/103 129/77   Pulse: 98 (!) 102 (!) 111 (!) 105  Resp: 18 18 16    Temp: 98 F (36.7 C) 98 F (36.7 C) 97.7 F (36.5 C)   TempSrc: Oral Oral Oral   SpO2: 97% 94% 99%   Weight:      Height:        Intake/Output Summary (Last 24 hours) at 08/05/2020 0952 Last data filed at 08/05/2020 0700 Gross per 24 hour  Intake 480 ml  Output 3055 ml  Net -2575 ml   Filed Weights   07/31/20 1244 08/01/20 0644  Weight: 71.7 kg 68.4 kg    Physical Exam   General: Well developed, well nourished, NAD Neck: Negative for carotid bruits. No JVD Lungs:Clear to ausculation bilaterally. Cardiovascular: Irregularly irregular. + murmurs Abdomen: Soft, non-tender, non-distended. No obvious abdominal masses. Extremities: No edema.  Neuro: Alert and oriented. No focal deficits. Psych: Responds to questions somewhat appropriately with normal affect.    Labs    Chemistry Recent Labs  Lab 08/03/20 0415 08/04/20 0329 08/05/20 0338  NA 138 136 136  K 3.5 3.4* 3.2*  CL 102 99 99  CO2 29 29 26   GLUCOSE 101* 100* 106*  BUN 17 18 19   CREATININE 0.90 0.88 0.93  CALCIUM 8.6* 8.5* 9.0  PROT 5.3* 5.4* 6.7  ALBUMIN 2.9* 2.9*  3.5  AST 22 93* 62*  ALT 17 83* 77*  ALKPHOS 57 92 110  BILITOT 1.3* 1.6* 1.1  GFRNONAA >60 >60 59*  ANIONGAP 7 8 11      Hematology Recent Labs  Lab 08/03/20 0415 08/04/20 0329 08/05/20 0338  WBC 6.8 6.4 8.0  RBC 4.43 4.49 5.00  HGB 12.7 12.9 14.2  HCT 39.9 40.5 45.3  MCV 90.1 90.2 90.6  MCH 28.7 28.7 28.4  MCHC 31.8 31.9 31.3  RDW 14.0 14.2 14.3  PLT 196 220 281    Cardiac EnzymesNo results for input(s): TROPONINI in the last 168 hours. No results for input(s): TROPIPOC in the last 168 hours.   BNPNo results for input(s): BNP, PROBNP in the last 168 hours.   DDimer No results for input(s): DDIMER in the last 168 hours.   Radiology    No results found.  Telemetry    08/05/20 AF in the 90-100's- Personally Reviewed  ECG    No new tracing as of 08/05/20 - Personally Reviewed  Cardiac Studies   Echocardiogram 08/01/2020   1. Left ventricular ejection fraction, by estimation, is 25 to 30%. The  left ventricle has severely decreased function. The left ventricle  demonstrates regional wall motion abnormalities (see scoring  diagram/findings for description). The left  ventricular internal cavity size was mildly dilated. Left ventricular  diastolic parameters are indeterminate.   2. Right ventricular systolic function is mildly reduced. The right  ventricular size is normal. There is severely elevated pulmonary artery  systolic pressure. The estimated right ventricular systolic pressure is  71.6 mmHg.   3. Left atrial size was mildly dilated.   4. The mitral valve is abnormal. Mild to moderate mitral valve  regurgitation. The mean mitral valve gradient is 2.4 mmHg with average  heart rate of 91 bpm. Severe mitral annular calcification.   5. Tricuspid valve regurgitation is mild to moderate.   6. The inferior vena cava is dilated in size with <50% respiratory  variability, suggesting right atrial pressure of 15 mmHg.   7. The aortic valve is tricuspid. Aortic  valve regurgitation is mild to  moderate. Aortic regurgitation PHT measures 450 msec. Aortic valve mean  gradient measures 11.0 mmHg. Low Flow Low Gradient Aortic Stenosis  suspected, in the setting of diminished  stroke volume index and valve morphology.  Patient Profile     87 y.o. female with a PMH of CAD s/p CABG in 2002, permanent atrial fibrillation, HTN, and HLD.   Assessment & Plan    1. CVA/Left ACA infarct: -CT Head on admission with an ACA territory infarct that appeared to be completed. MRI with moderate sized left ACA infarct, Xarelto held for 4 days to avoid any hemorrhagic conversion. Started in ASA 325mg >>plan to restart Xarelto and use Plavix for CAD/NSTEMI >>will defer to Neuro.   2. CAD s/p CABG: -Patient presented after an unwitnessed fall at home with unclear downtime.  -HsTrop elevated to 3240, CK 731.  -EKG with non-specific ST-T wave abnormalities not terribly different than previous tracings  -Echo with newly reduced LVEF at 25-30%  3. Permanent atrial fibrillation: -EKG with atrial fibrillation with RVR with rates in the 110s on arrival -Telemetry today with uncontrolled rates in the 130 range  -Continue beta blocker for rate control>>> increase to 37.5 today  -Continue Xarelto   4. Acute systolic CHF/presumed ICM: -Echocardiogram this admission with LVEF at 25-30% -Continue losartan, BB -Appears euvolemic on exam today   5. HTN: -Improved today at 129/77 from 161/103  6. HLD: -Last LDL, 140 with goal <70 -Continue high intensity atorvastatin  -Needs follow up Lipid panel and LFTs in 6-8 weeks    Signed, NP-C [facility] Pager: [phone] 08/05/2020, 9:52 AM     For questions or updates, please contact   Please consult [email or web address] for contact info under Cardiology/STEMI.   History and all data above reviewed.  Patient examined.  I agree with the findings as above.   Indicates no acute complaints.  No SOB. The patient exam  reveals COR:    Irregular , systolic murmur  ,  Lungs: Clear  ,  Abd: Positive bowel sounds, no rebound no guarding, Ext No edema  .  All available labs, radiology testing, previous records reviewed. Agree with documented assessment and plan.   Atrial fib still not rate controlled.  Increase beta blocker as above.    08/07/2020 Briza Bark  12:11 PM  08/05/2020

## 2020-08-05 NOTE — Progress Notes (Signed)
Charge nurse had called to say HR was up in the 150s.  Upon entering room, pt was found combative, trying to climb out of the bed and attempting to pull off tele leads and purwick and get up.  O2 was off and her sat was 83% on room air.  When this nurse attempted to get her back up in the bed and the O2 tubing out of her hand to put back on her face, she became more combative and confused and was belligerent.  After getting assistance, we got her O2 back on her, got her up in the bed and reattached the purwick and within a few seconds, she was satting in the upper 90s and was much calmer.  Her HR went back down into the 90s.  She is resting at this time and sat is 95% with O2 on at 2L/[place].

## 2020-08-05 NOTE — Progress Notes (Signed)
Inpatient Rehabilitation Admissions Coordinator   CIR bed is available to admit patient to today. Dr Marland Mcalpine in agreement to admit. I contacted pt's niece, [name], by phone and she is an in agreement. I will make the arrangements to admit today.  Ottie Glazier, RN, MSN Rehab Admissions Coordinator [phone] 08/05/2020 1:03 PM

## 2020-08-05 NOTE — Discharge Summary (Signed)
Physician Discharge Summary  Ellen Barajas:[id number] DOB: [birth year] DOA: 07/31/2020  PCP: No primary care provider on file.  Admit date: 07/31/2020 Discharge date: 08/05/2020  Admitted From: Home Disposition: CIR  Recommendations for Outpatient Follow-up:  Follow up with PCP in 1-2 weeks Follow up with Cardiology as an outpatient in 1-2 weeks; Have EKG done Daily Follow up with Neurology as an outpatient in 4 weeks Please obtain BMP/CBC in one week Please follow up on the following pending results:  Home Health: No Equipment/Devices: None  Discharge Condition: Stable CODE STATUS: DO NOT RESUSCITATE  Diet recommendation: Heart Healthy Diet   Brief/Interim Summary:  The patient is an 87 year old Caucasian female with a past medical history significant for but not limited to atrial fibrillation on anticoagulation with Xarelto, history of CAD status post CABG, hypertension, hyperlipidemia as well as other comorbidities who was found at her house by her niece who is extremely confused.  She was status post fall and she was subsequently brought to the ED and was found to have a non-STEMI and acute CVA.  She had multiple facial fractures and a mildly displaced fracture of the right zygomatic arch and anterior and posterior lateral walls of the right maxillary sinus.  ENT recommended supportive care and recommend antibiotics.  She underwent an MRI of the brain and found to have a moderate to large acute left ACA infarct.  Neurology was consulted.  Cardiology is consulted given her non-STEMI and she had a TTE which showed an EF of 25% to 30%.  She was started on diuresis and resumed on her anticoagulation in addition to Plavix.  She was deemed medically stable to be discharged to [facility] and cardiology is requesting daily EKGs given her uncontrolled A. fib this morning.    Discharge Diagnoses:  Principal Problem:   NSTEMI (non-ST elevated myocardial infarction) (HCC) Active Problems:   Longstanding  persistent atrial fibrillation (HCC)   IHD (ischemic heart disease)   Dyslipidemia   CAD (coronary artery disease)   Essential hypertension   Multiple facial fractures, closed, initial encounter (HCC)   Syncope and collapse   Cerebral embolism with cerebral infarction  NSTEMI -poA -no Further Chest Pain -Cardiology following and will be continuing Clopidogrel and stopping ASA -Started Xarelto  -Troponin Level was 3240 and trended up to 4234 -Cardiology recommending BB and Losartan and Anticoagulation with Xarelto    Acute CVA -Suspicion for embolic CVA in the setting of non-STEMI and new LV dysfunction.   -Continues to have unchanged right-sided deficits and expressive aphasia.   -Initially was on anticoagulation-but given size of infarct-and risk for hemorrhagic transformation-held for a few days-plans are to restart Xarelto 08/04/20 (cleared with neurology-Dr.Xu).   -CIR following-for potential CIR admission on discharge and stable to D/C    Newly diagnosed systolic heart failure:  -Suspect ischemic cardiomyopathy in the setting of non-STEMI.   -Volume status is stable -Cardiology following-on low-dose Lasix, metoprolol and losartan. Metoprolol increased to 37.5 mg po BID -Appears to be Euvolemic; ECHO showed EF of 25-30% -Follow up with the Cardiologist as an outpatient    Persistent atrial fibrillation -Rate controlled-as noted above-restarting starting Xarelto yesterday as Dr. Delmer Islam discussed with neurology over the phone.   -Cardiology following and increased BB given her Uncontrolled HR today  -Now on Metoprolol Tartrate 37.5 mg po BID -Cardiology is requesting daily EKGs   History of CAD s/p CABG -Cardiology following  -Meds as above  -No Chest Pain  -Repeat ECHO with EF Of 25-30%  HTN:  -BP relatively stable-on losartan/metoprolol/Lasix -Allow some permissive hypertension. -Continue to Monitor BP per Protocol -Last BP was 153/73   Multiple facial  fractures -Admitting MD spoke with ENT-recommendations were for supportive care -Empiric Keflex to prevent sinusitis (blood in maxillary sinus with pulling). Keflex started 07/31/20 -Follow ENT as an outpatient    Mildly elevated CK -Probably either mild rhabdomyolysis or a sequelae of non-STEMI. -CK was 731 and trended down to 448   Abnormal LFT's/Mildly elevater Transaminases -Unclear Significance but mild -AST went from 27 -> 22 -> 93 -> 62 -ALT went from 16 -> 17 -> 83 -> 77 -May need to stop Statin if not improving or continues to worsen. If remains elevated recommend RUQ U/S and Acute Hepatitis Panel -Currently on Atorvastatin  -Repeat CMP in the AM     Hypokalemia -Mild and K+ went from 3.4 -> 3.2 -Replete with po Kcl 40 mEQ BID x2 -Continue to Monitor and Replete as Necessary -Repeat CMP in the AM   Hyperbiliriubinemia -Improved. T Bili went from 1.3 -> 1.3 -> 1.6 -> 1.1 -Continue to Monitor and Trend -Repeat CMP in the AM    GOC: -DNR -Dr. Jerral Ralph discussed fiscussed with niece at bedside-no heroics-okay with gentle medical treatment  Discharge Instructions  Discharge Instructions     Ambulatory referral to Neurology   Complete by: As directed    Follow up with stroke clinic NP (Jessica Vanschaick or Darrol Angel, if both not available, consider Manson Allan, or Ahern) at [facility] in about 4 weeks. Thanks.      Allergies as of 08/05/2020       Reactions   Crestor [rosuvastatin Calcium] Other (See Comments)   unknown   Lipitor [atorvastatin Calcium] Other (See Comments)   unknown   Niaspan [niacin Er] Other (See Comments)   unknown   Other Other (See Comments)   PER PATIENT SHE DOES NOT TOLERATE NARCOTICS   Pravastatin Other (See Comments)   unknown   Ramipril [ramipril] Other (See Comments)   Hyperkalemia   Welchol [colesevelam Hcl] Other (See Comments)   unknown   Zetia [ezetimibe] Other (See Comments)   unknown        Medication List      STOP taking these medications    atenolol 25 MG tablet Commonly known as: TENORMIN       TAKE these medications    acetaminophen 325 MG tablet Commonly known as: TYLENOL Take 2 tablets (650 mg total) by mouth every 6 (six) hours as needed for moderate pain (headache).   atorvastatin 40 MG tablet Commonly known as: LIPITOR Take 1 tablet (40 mg total) by mouth daily. Start taking on: August 06, 2020   bisacodyl 10 MG suppository Commonly known as: DULCOLAX Place 1 suppository (10 mg total) rectally daily as needed for moderate constipation.   cephALEXin 500 MG capsule Commonly known as: KEFLEX Take 1 capsule (500 mg total) by mouth every 8 (eight) hours for 2 days.   clopidogrel 75 MG tablet Commonly known as: PLAVIX Take 1 tablet (75 mg total) by mouth daily. Start taking on: August 06, 2020   furosemide 20 MG tablet Commonly known as: LASIX Take 1 tablet (20 mg total) by mouth daily. Start taking on: August 06, 2020   losartan 25 MG tablet Commonly known as: COZAAR Take 1 tablet by mouth once daily   Metoprolol Tartrate 37.5 MG Tabs Take 37.5 mg by mouth 2 (two) times daily.   polyethylene glycol 17 g packet Commonly known  as: MIRALAX / GLYCOLAX Take 17 g by mouth daily as needed for mild constipation.   Rivaroxaban 15 MG Tabs tablet Commonly known as: Xarelto Take 1 tablet (15 mg total) by mouth daily with supper.        Follow-up Information     [facility]. Schedule an appointment as soon as possible for a visit in 1 month(s).   Specialty: Neurology Why: stroke clinic Contact information: [address] [place] [address] [phone]               Allergies  Allergen Reactions   Crestor [Rosuvastatin Calcium] Other (See Comments)    unknown   Lipitor [Atorvastatin Calcium] Other (See Comments)    unknown   Niaspan [Niacin Er] Other (See Comments)    unknown   Other Other (See Comments)     PER PATIENT SHE DOES NOT TOLERATE NARCOTICS   Pravastatin Other (See Comments)    unknown   Ramipril [Ramipril] Other (See Comments)    Hyperkalemia   Welchol [Colesevelam Hcl] Other (See Comments)    unknown   Zetia [Ezetimibe] Other (See Comments)    unknown   Consultations: Neurology Cardiology  Procedures/Studies: CT Head Wo Contrast  Result Date: 07/31/2020 CLINICAL DATA:  Fall today with facial trauma. EXAM: CT HEAD WITHOUT CONTRAST CT MAXILLOFACIAL WITHOUT CONTRAST CT CERVICAL SPINE WITHOUT CONTRAST TECHNIQUE: Multidetector CT imaging of the head, cervical spine, and maxillofacial structures were performed using the standard protocol without intravenous contrast. Multiplanar CT image reconstructions of the cervical spine and maxillofacial structures were also generated. COMPARISON:  None. FINDINGS: CT HEAD FINDINGS Brain: There is no evidence of acute intracranial hemorrhage, mass lesion, brain edema or extra-axial fluid collection. Mild-to-moderate atrophy with prominence of the ventricles and subarachnoid spaces. There are patchy small vessel ischemic changes in the periventricular white matter and basal ganglia bilaterally. There is no CT evidence of acute cortical infarction. Vascular: Intracranial vascular calcifications. No hyperdense vessel identified. Skull: Negative for fracture or focal lesion. Other: None. CT MAXILLOFACIAL FINDINGS Osseous: There are acute, mildly displaced fractures of the anterior and posterolateral walls of the right maxillary sinus with associated layering blood in the sinus. There is also a mildly comminuted and depressed fracture of the right zygomatic arch. Possible minimal diastasis at the right frontozygomatic suture. No evidence of orbital or other facial fracture. The mandible and temporomandibular joints are intact. There are moderate TMJ degenerative changes bilaterally. Orbits: No evidence of orbital fracture. The globes are intact. Previous lens  surgery and scleral banding bilaterally. No evidence of orbital hematoma. The extraocular muscles and optic nerves appear normal. Sinuses: Layering blood in the right maxillary sinus. The additional paranasal sinuses are clear without air-fluid levels. The mastoid air cells and middle ears are clear. Soft tissues: Soft tissue swelling over the right cheek and malar region without focal hematoma or foreign body. CT CERVICAL SPINE FINDINGS Alignment: Straightening with minimal retrolisthesis at C5-6 and anterolisthesis at C7-T1 and T1-2. Skull base and vertebrae: No evidence of acute fracture or traumatic subluxation. Soft tissues and spinal canal: Mild ossification of the ligamentum nuchae. No prevertebral fluid or swelling. No visible canal hematoma. Disc levels: Multilevel mild spondylosis for age, with disc space narrowing and uncinate spurring greatest at C5-6. Scattered facet degenerative changes. Upper chest: Bilateral carotid atherosclerosis. Other: None. IMPRESSION: 1. Mildly displaced fractures of the right zygomatic arch, anterior and posterolateral walls of the right maxillary sinus and possible mild diastasis of the right frontozygomatic suture (  tripod fracture variant). No other facial fractures. 2. Overlying right facial soft tissue swelling without orbital hematoma or foreign body. 3. No acute intracranial or calvarial findings. Mild atrophy and chronic small vessel ischemic changes. 4. No evidence of acute cervical spine fracture, traumatic subluxation or static signs of instability. Multilevel cervical spondylosis. Electronically Signed   By: Carey Bullocks M.D.   On: 07/31/2020 14:22   CT Cervical Spine Wo Contrast  Result Date: 07/31/2020 CLINICAL DATA:  Fall today with facial trauma. EXAM: CT HEAD WITHOUT CONTRAST CT MAXILLOFACIAL WITHOUT CONTRAST CT CERVICAL SPINE WITHOUT CONTRAST TECHNIQUE: Multidetector CT imaging of the head, cervical spine, and maxillofacial structures were performed  using the standard protocol without intravenous contrast. Multiplanar CT image reconstructions of the cervical spine and maxillofacial structures were also generated. COMPARISON:  None. FINDINGS: CT HEAD FINDINGS Brain: There is no evidence of acute intracranial hemorrhage, mass lesion, brain edema or extra-axial fluid collection. Mild-to-moderate atrophy with prominence of the ventricles and subarachnoid spaces. There are patchy small vessel ischemic changes in the periventricular white matter and basal ganglia bilaterally. There is no CT evidence of acute cortical infarction. Vascular: Intracranial vascular calcifications. No hyperdense vessel identified. Skull: Negative for fracture or focal lesion. Other: None. CT MAXILLOFACIAL FINDINGS Osseous: There are acute, mildly displaced fractures of the anterior and posterolateral walls of the right maxillary sinus with associated layering blood in the sinus. There is also a mildly comminuted and depressed fracture of the right zygomatic arch. Possible minimal diastasis at the right frontozygomatic suture. No evidence of orbital or other facial fracture. The mandible and temporomandibular joints are intact. There are moderate TMJ degenerative changes bilaterally. Orbits: No evidence of orbital fracture. The globes are intact. Previous lens surgery and scleral banding bilaterally. No evidence of orbital hematoma. The extraocular muscles and optic nerves appear normal. Sinuses: Layering blood in the right maxillary sinus. The additional paranasal sinuses are clear without air-fluid levels. The mastoid air cells and middle ears are clear. Soft tissues: Soft tissue swelling over the right cheek and malar region without focal hematoma or foreign body. CT CERVICAL SPINE FINDINGS Alignment: Straightening with minimal retrolisthesis at C5-6 and anterolisthesis at C7-T1 and T1-2. Skull base and vertebrae: No evidence of acute fracture or traumatic subluxation. Soft tissues and  spinal canal: Mild ossification of the ligamentum nuchae. No prevertebral fluid or swelling. No visible canal hematoma. Disc levels: Multilevel mild spondylosis for age, with disc space narrowing and uncinate spurring greatest at C5-6. Scattered facet degenerative changes. Upper chest: Bilateral carotid atherosclerosis. Other: None. IMPRESSION: 1. Mildly displaced fractures of the right zygomatic arch, anterior and posterolateral walls of the right maxillary sinus and possible mild diastasis of the right frontozygomatic suture (tripod fracture variant). No other facial fractures. 2. Overlying right facial soft tissue swelling without orbital hematoma or foreign body. 3. No acute intracranial or calvarial findings. Mild atrophy and chronic small vessel ischemic changes. 4. No evidence of acute cervical spine fracture, traumatic subluxation or static signs of instability. Multilevel cervical spondylosis. Electronically Signed   By: Carey Bullocks M.D.   On: 07/31/2020 14:22   MR BRAIN WO CONTRAST  Result Date: 08/01/2020 CLINICAL DATA:  Acute stroke suspected EXAM: MRI HEAD WITHOUT CONTRAST TECHNIQUE: Multiplanar, multiecho pulse sequences of the brain and surrounding structures were obtained without intravenous contrast. COMPARISON:  CT and CTA from earlier today FINDINGS: Brain: Moderate to large area of cortically based infarction in the high and parasagittal left frontal lobe, best aligned to the ACA distribution.  A tiny cortically based infarct is seen at the cortex of the anterior left temporal lobe. Confluent chronic small vessel ischemia in the cerebral white matter and pons. No acute hemorrhage, hydrocephalus, or masslike finding. Generalized brain atrophy. Vascular: Recent CTA.  No additional findings. Skull and upper cervical spine: Normal marrow signal Sinuses/Orbits: Right maxillary hemosinus, known. Bilateral cataract resection. IMPRESSION: 1. Moderate to large acute left ACA branch infarct. 2. Tiny  acute infarct in the left anterior temporal cortex. 3. Atrophy and extensive chronic small vessel ischemia. Electronically Signed   By: Marnee Spring M.D.   On: 08/01/2020 12:00   DG Pelvis Portable  Result Date: 07/31/2020 CLINICAL DATA:  Status post fall. EXAM: PORTABLE PELVIS 1-2 VIEWS COMPARISON:  None. FINDINGS: There is no evidence of pelvic fracture or diastasis. Fixation hardware for healed bilateral intertrochanteric fractures noted. No pelvic bone lesions are seen. Lower lumbar spondylosis. IMPRESSION: No acute abnormality. Electronically Signed   By: Drusilla Kanner M.D.   On: 07/31/2020 15:12   CT CEREBRAL PERFUSION W CONTRAST  Result Date: 08/01/2020 CLINICAL DATA:  Stroke suspected EXAM: CT PERFUSION BRAIN TECHNIQUE: Multiphase CT imaging of the brain was performed following IV bolus contrast injection. Subsequent parametric perfusion maps were calculated using RAPID software. CONTRAST:  20mL OMNIPAQUE IOHEXOL 350 MG/ML SOLN COMPARISON:  Head CT and CTA from earlier today FINDINGS: CT Brain Perfusion Findings: CBF (<30%) Volume: 31mL Perfusion (Tmax>6.0s) volume: 39mL IMPRESSION: No core infarct or cerebral ischemia by standard perfusion thresholds. Electronically Signed   By: Marnee Spring M.D.   On: 08/01/2020 05:13   DG Chest Port 1 View  Result Date: 07/31/2020 CLINICAL DATA:  Status post fall. EXAM: PORTABLE CHEST 1 VIEW COMPARISON:  Single-view of the chest 11/21/2018. FINDINGS: The patient is status post CABG. There is cardiomegaly. Aortic atherosclerosis. Lungs clear. No pneumothorax or pleural fluid. No acute or focal bony abnormality. IMPRESSION: No acute disease. Electronically Signed   By: Drusilla Kanner M.D.   On: 07/31/2020 15:13   ECHOCARDIOGRAM COMPLETE  Result Date: 08/01/2020    ECHOCARDIOGRAM REPORT   Patient Name:   Ellen Barajas Date of Exam: 08/01/2020 Medical Rec #:  [id number]    Height:       63.0 in Accession #:    [id number]   Weight:       150.8 lb Date of  Birth:  November 10, 1928     BSA:          1.715 m Patient Age:    87 years     BP:           151/90 mmHg Patient Gender: F            HR:           88 bpm. Exam Location:  Inpatient Procedure: 2D Echo, Cardiac Doppler and Color Doppler Indications:    NSTEMI I21.4  History:        Patient has prior history of Echocardiogram examinations, most                 recent 10/14/2019. CAD and Previous Myocardial Infarction, Prior                 CABG, Arrythmias:Atrial Fibrillation; Risk Factors:Dyslipidemia.  Sonographer:    Renella Cunas RDCS Referring Phys: [id number] ALLISON WOLFE IMPRESSIONS  1. Left ventricular ejection fraction, by estimation, is 25 to 30%. The left ventricle has severely decreased function. The left ventricle demonstrates regional wall motion abnormalities (see scoring diagram/findings for  description). The left ventricular internal cavity size was mildly dilated. Left ventricular diastolic parameters are indeterminate.  2. Right ventricular systolic function is mildly reduced. The right ventricular size is normal. There is severely elevated pulmonary artery systolic pressure. The estimated right ventricular systolic pressure is 71.6 mmHg.  3. Left atrial size was mildly dilated.  4. The mitral valve is abnormal. Mild to moderate mitral valve regurgitation. The mean mitral valve gradient is 2.4 mmHg with average heart rate of 91 bpm. Severe mitral annular calcification.  5. Tricuspid valve regurgitation is mild to moderate.  6. The inferior vena cava is dilated in size with <50% respiratory variability, suggesting right atrial pressure of 15 mmHg.  7. The aortic valve is tricuspid. Aortic valve regurgitation is mild to moderate. Aortic regurgitation PHT measures 450 msec. Aortic valve mean gradient measures 11.0 mmHg. Low Flow Low Gradient Aortic Stenosis suspected, in the setting of diminished stroke volume index and valve morphology. Comparison(s): A prior study was performed on 10/14/2019. Prior images  reviewed side by side. Significant changes in LVEF have occurred. FINDINGS  Left Ventricle: Left ventricular ejection fraction, by estimation, is 25 to 30%. The left ventricle has severely decreased function. The left ventricle demonstrates regional wall motion abnormalities. The left ventricular internal cavity size was mildly  dilated. There is no left ventricular hypertrophy. Left ventricular diastolic parameters are indeterminate.  LV Wall Scoring: The anterior wall, antero-lateral wall, and posterior wall are hypokinetic. Right Ventricle: The right ventricular size is normal. No increase in right ventricular wall thickness. Right ventricular systolic function is mildly reduced. There is severely elevated pulmonary artery systolic pressure. The tricuspid regurgitant velocity is 3.76 m/s, and with an assumed right atrial pressure of 15 mmHg, the estimated right ventricular systolic pressure is 71.6 mmHg. Left Atrium: Left atrial size was mildly dilated. Right Atrium: Right atrial size was normal in size. Pericardium: Trivial pericardial effusion is present. Mitral Valve: The mitral valve is abnormal. Severe mitral annular calcification. Mild to moderate mitral valve regurgitation. The mean mitral valve gradient is 2.4 mmHg with average heart rate of 91 bpm. Tricuspid Valve: The tricuspid valve is grossly normal. Tricuspid valve regurgitation is mild to moderate. Aortic Valve: The aortic valve is tricuspid. Aortic valve regurgitation is mild to moderate. Aortic regurgitation PHT measures 450 msec. Aortic valve mean gradient measures 11.0 mmHg. Aortic valve peak gradient measures 20.2 mmHg. Aortic valve area, by VTI measures 0.88 cm. Pulmonic Valve: The pulmonic valve was not well visualized. Pulmonic valve regurgitation is mild. No evidence of pulmonic stenosis. Aorta: The aortic root is normal in size and structure. Venous: The inferior vena cava is dilated in size with less than 50% respiratory variability,  suggesting right atrial pressure of 15 mmHg. IAS/Shunts: The atrial septum is grossly normal.  LEFT VENTRICLE PLAX 2D LVIDd:         5.70 cm LVIDs:         4.90 cm LV PW:         0.80 cm LV IVS:        0.80 cm LVOT diam:     2.00 cm LV SV:         41 LV SV Index:   24 LVOT Area:     3.14 cm  LV Volumes (MOD) LV vol d, MOD A2C: 128.0 ml LV vol d, MOD A4C: 123.0 ml LV vol s, MOD A2C: 95.6 ml LV vol s, MOD A4C: 85.6 ml LV SV MOD A2C:     32.4  ml LV SV MOD A4C:     123.0 ml LV SV MOD BP:      33.2 ml RIGHT VENTRICLE RV S prime:     6.08 cm/s TAPSE (M-mode): 1.1 cm LEFT ATRIUM             Index       RIGHT ATRIUM           Index LA diam:        3.90 cm 2.27 cm/m  RA Area:     16.50 cm LA Vol (A2C):   52.0 ml 30.32 ml/m RA Volume:   41.50 ml  24.20 ml/m LA Vol (A4C):   52.6 ml 30.67 ml/m LA Biplane Vol: 57.0 ml 33.24 ml/m  AORTIC VALVE AV Area (Vmax):    0.95 cm AV Area (Vmean):   0.89 cm AV Area (VTI):     0.88 cm AV Vmax:           225.00 cm/s AV Vmean:          151.500 cm/s AV VTI:            0.464 m AV Peak Grad:      20.2 mmHg AV Mean Grad:      11.0 mmHg LVOT Vmax:         67.80 cm/s LVOT Vmean:        42.900 cm/s LVOT VTI:          0.130 m LVOT/AV VTI ratio: 0.28 AI PHT:            450 msec  AORTA Ao Root diam: 3.30 cm Ao Asc diam:  3.30 cm MITRAL VALVE              TRICUSPID VALVE MV Mean grad: 2.4 mmHg    TR Peak grad:   56.6 mmHg MR Peak grad: 122.3 mmHg  TR Vmax:        376.00 cm/s MR Mean grad: 76.0 mmHg MR Vmax:      553.00 cm/s SHUNTS MR Vmean:     408.0 cm/s  Systemic VTI:  0.13 m                           Systemic Diam: 2.00 cm Riley Lam MD Electronically signed by Riley Lam MD Signature Date/Time: 08/01/2020/1:52:37 PM    Final    CT HEAD CODE STROKE WO CONTRAST  Result Date: 08/01/2020 CLINICAL DATA:  Code stroke.  Left MCA syndrome EXAM: CT HEAD WITHOUT CONTRAST TECHNIQUE: Contiguous axial images were obtained from the base of the skull through the vertex without  intravenous contrast. COMPARISON:  07/31/2020 FINDINGS: Brain: Acute infarct in the parasagittal left frontal lobe involving a moderate area, left ACA territory. No MCA territory infarct is seen. Chronic small vessel ischemia in the white matter. Cerebral volume loss with ventriculomegaly. No acute hemorrhage Vascular: No hyperdense vessel Skull: Negative for fracture Sinuses/Orbits: Right facial fractures as described on preceding face CT. Other: These results were called by telephone at the time of interpretation on 08/01/2020 at 4:49 am to provider [facility] , who verbally acknowledged these results. ASPECTS [facility] Stroke Program Early CT Score) - Ganglionic level infarction (caudate, lentiform nuclei, internal capsule, insula, M1-M3 cortex): 7 - Supraganglionic infarction (M4-M6 cortex): 3 Total score (0-10 with 10 being normal): 10 IMPRESSION: 1. Moderate acute infarct in the parasagittal left frontal lobe, ACA territory. 2. Aging brain. Electronically Signed   By: Marnee Spring  M.D.   On: 08/01/2020 04:56   CT ANGIO HEAD CODE STROKE  Result Date: 08/01/2020 CLINICAL DATA:  Acute stroke EXAM: CT ANGIOGRAPHY HEAD AND NECK TECHNIQUE: Multidetector CT imaging of the head and neck was performed using the standard protocol during bolus administration of intravenous contrast. Multiplanar CT image reconstructions and MIPs were obtained to evaluate the vascular anatomy. Carotid stenosis measurements (when applicable) are obtained utilizing NASCET criteria, using the distal internal carotid diameter as the denominator. CONTRAST:  34mL OMNIPAQUE IOHEXOL 350 MG/ML SOLN COMPARISON:  None available FINDINGS: CTA NECK FINDINGS Aortic arch: Atheromatous plaque with 3 vessel branching. Brachiocephalic artery origin is not covered. Right carotid system: Diffuse atheromatous wall thickening of the common carotid extending to the bifurcation. No flow limiting stenosis or ulceration. Left carotid system: Diffuse  atheromatous plaque of mixed density involving the common carotid and extending into the proximal ICA. No flow limiting stenosis or ulceration Vertebral arteries: Proximal subclavian atherosclerosis on both sides without flow reducing stenosis. Although limited by motion, there is high-grade left V1 segment stenosis. Even worse stenosis in the left V3 segment with no flow by the dura and reconstitution serving the left PICA from the basilar. Dominant right vertebral artery is diffusely patent Skeleton: Recent dedicated cervical spine and facial CT. No interval findings. Other neck: No acute finding. Upper chest: Small pleural effusions. Vague nodular density in the left upper lobe with hazy margins, favoring a inflammatory process Review of the MIP images confirms the above findings CTA HEAD FINDINGS Anterior circulation: Left MCA type symptoms. There is intermittent severe narrowing with flow gap involving the left M2 and M3 branches, but flow is seen beyond the major vessels there is actually even worse stenosis at the right M1 segment with accentuated lenticulostriate and downstream reconstitution. Bilateral ACA atherosclerosis with especially right A2 segment narrowing. 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 Posterior circulation: Dominant right vertebral artery shows diffuse atheromatous plaque with no flow limiting stenosis. Reconstituted distal left V4 segment serving the left PICA. The basilar is smooth and diffusely patent. Atheromatous irregularity of the bilateral PCA beyond the P2 segments. Venous sinuses: Unremarkable in the arterial phase Anatomic variants: None significant Review of the MIP images confirms the above findings IMPRESSION: 1. Negative for large vessel occlusion. 2. Advanced intracranial atherosclerosis. Most notable stenoses are at the right M1 and left M2 segments. 3. Atherosclerosis in the neck most notably affecting the left vertebral artery which is occluded  by the dura and reconstitutes from the basilar. 4. 2 mm A-comm aneurysm. Electronically Signed   By: Marnee Spring M.D.   On: 08/01/2020 05:05   CT ANGIO NECK CODE STROKE  Result Date: 08/01/2020 CLINICAL DATA:  Acute stroke EXAM: CT ANGIOGRAPHY HEAD AND NECK TECHNIQUE: Multidetector CT imaging of the head and neck was performed using the standard protocol during bolus administration of intravenous contrast. Multiplanar CT image reconstructions and MIPs were obtained to evaluate the vascular anatomy. Carotid stenosis measurements (when applicable) are obtained utilizing NASCET criteria, using the distal internal carotid diameter as the denominator. CONTRAST:  83mL OMNIPAQUE IOHEXOL 350 MG/ML SOLN COMPARISON:  None available FINDINGS: CTA NECK FINDINGS Aortic arch: Atheromatous plaque with 3 vessel branching. Brachiocephalic artery origin is not covered. Right carotid system: Diffuse atheromatous wall thickening of the common carotid extending to the bifurcation. No flow limiting stenosis or ulceration. Left carotid system: Diffuse atheromatous plaque of mixed density involving the common carotid and extending into the proximal ICA. No flow limiting stenosis  or ulceration Vertebral arteries: Proximal subclavian atherosclerosis on both sides without flow reducing stenosis. Although limited by motion, there is high-grade left V1 segment stenosis. Even worse stenosis in the left V3 segment with no flow by the dura and reconstitution serving the left PICA from the basilar. Dominant right vertebral artery is diffusely patent Skeleton: Recent dedicated cervical spine and facial CT. No interval findings. Other neck: No acute finding. Upper chest: Small pleural effusions. Vague nodular density in the left upper lobe with hazy margins, favoring a inflammatory process Review of the MIP images confirms the above findings CTA HEAD FINDINGS Anterior circulation: Left MCA type symptoms. There is intermittent severe narrowing  with flow gap involving the left M2 and M3 branches, but flow is seen beyond the major vessels there is actually even worse stenosis at the right M1 segment with accentuated lenticulostriate and downstream reconstitution. Bilateral ACA atherosclerosis with especially right A2 segment narrowing. 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 Posterior circulation: Dominant right vertebral artery shows diffuse atheromatous plaque with no flow limiting stenosis. Reconstituted distal left V4 segment serving the left PICA. The basilar is smooth and diffusely patent. Atheromatous irregularity of the bilateral PCA beyond the P2 segments. Venous sinuses: Unremarkable in the arterial phase Anatomic variants: None significant Review of the MIP images confirms the above findings IMPRESSION: 1. Negative for large vessel occlusion. 2. Advanced intracranial atherosclerosis. Most notable stenoses are at the right M1 and left M2 segments. 3. 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 4. 2 mm A-comm aneurysm. Electronically Signed   By: Marnee Spring M.D.   On: 08/01/2020 05:05   CT Maxillofacial WO CM  Result Date: 07/31/2020 CLINICAL DATA:  Fall today with facial trauma. EXAM: CT HEAD WITHOUT CONTRAST CT MAXILLOFACIAL WITHOUT CONTRAST CT CERVICAL SPINE WITHOUT CONTRAST TECHNIQUE: Multidetector CT imaging of the head, cervical spine, and maxillofacial structures were performed using the standard protocol without intravenous contrast. Multiplanar CT image reconstructions of the cervical spine and maxillofacial structures were also generated. COMPARISON:  None. FINDINGS: CT HEAD FINDINGS Brain: There is no evidence of acute intracranial hemorrhage, mass lesion, brain edema or extra-axial fluid collection. Mild-to-moderate atrophy with prominence of the ventricles and subarachnoid spaces. There are patchy small  vessel ischemic changes in the periventricular white matter and basal ganglia bilaterally. There is no CT evidence of acute cortical infarction. Vascular: Intracranial vascular calcifications. No hyperdense vessel identified. Skull: Negative for fracture or focal lesion. Other: None. CT MAXILLOFACIAL FINDINGS Osseous: There are acute, mildly displaced fractures of the anterior and posterolateral walls of the right maxillary sinus with associated layering blood in the sinus. There is also a mildly comminuted and depressed fracture of the right zygomatic arch. Possible minimal diastasis at the right frontozygomatic suture. No evidence of orbital or other facial fracture. The mandible and temporomandibular joints are intact. There are moderate TMJ degenerative changes bilaterally. Orbits: No evidence of orbital fracture. The globes are intact. Previous lens surgery and scleral banding bilaterally. No evidence of orbital hematoma. The extraocular muscles and optic nerves appear normal. Sinuses: Layering blood in the right maxillary sinus. The additional paranasal sinuses are clear without air-fluid levels. The mastoid air cells and middle ears are clear. Soft tissues: Soft tissue swelling over the right cheek and malar region without focal hematoma or foreign body. CT CERVICAL SPINE FINDINGS Alignment: Straightening with minimal retrolisthesis at C5-6 and anterolisthesis at C7-T1  and T1-2. Skull base and vertebrae: No evidence of acute fracture or traumatic subluxation. Soft tissues and spinal canal: Mild ossification of the ligamentum nuchae. No prevertebral fluid or swelling. No visible canal hematoma. Disc levels: Multilevel mild spondylosis for age, with disc space narrowing and uncinate spurring greatest at C5-6. Scattered facet degenerative changes. Upper chest: Bilateral carotid atherosclerosis. Other: None. IMPRESSION: 1. Mildly displaced fractures of the right zygomatic arch, anterior and posterolateral walls  of the right maxillary sinus and possible mild diastasis of the right frontozygomatic suture (tripod fracture variant). No other facial fractures. 2. Overlying right facial soft tissue swelling without orbital hematoma or foreign body. 3. No acute intracranial or calvarial findings. Mild atrophy and chronic small vessel ischemic changes. 4. No evidence of acute cervical spine fracture, traumatic subluxation or static signs of instability. Multilevel cervical spondylosis. Electronically Signed   By: Carey Bullocks M.D.   On: 07/31/2020 14:22    Subjective: Seen and examined at bedside and continues to remain aphasic.  No nausea or vomiting.  Denies any pain anywhere and shakes her head no.  Denies any other concerns or complaints at this time and is stable to discharge to CIR at this time.   Discharge Exam: Vitals:   08/05/20 0730 08/05/20 1118  BP:  (!) 153/73  Pulse: (!) 105 (!) 104  Resp:  20  Temp:  98.8 F (37.1 C)  SpO2:  99%   Vitals:   08/05/20 0321 08/05/20 0724 08/05/20 0730 08/05/20 1118  BP: (!) 161/103 129/77  (!) 153/73  Pulse: (!) 102 (!) 111 (!) 105 (!) 104  Resp: Temp: 98 F (36.7 C) 97.7 F (36.5 C)  98.8 F (37.1 C)  TempSrc: Oral Oral  Oral  SpO2: 94% 99%  99%  Weight:      Height:       General: Pt is alert, awake, not in acute distress but is aphasic and has multiple facial bruising and ecchymosis Cardiovascular: RRR, S1/S2 +, no rubs, no gallops Respiratory: Diminished bilaterally, no wheezing, no rhonchi Abdominal: Soft, NT, distended slightly, bowel sounds + Extremities: minimal edema, no cyanosis  The results of significant diagnostics from this hospitalization (including imaging, microbiology, ancillary and laboratory) are listed below for reference.    Microbiology: Recent Results (from the past 240 hour(s))  Resp Panel by RT-PCR (Flu A&B, Covid) Nasopharyngeal Swab     Status: None   Collection Time: 07/31/20  1:26 PM   Specimen:  Nasopharyngeal Swab; Nasopharyngeal(NP) swabs in vial transport medium  Result Value Ref Range Status   SARS Coronavirus 2 by RT PCR NEGATIVE NEGATIVE Final    Comment: (NOTE) SARS-CoV-2 target nucleic acids are NOT DETECTED.  The SARS-CoV-2 RNA is generally detectable in upper respiratory specimens during the acute phase of infection. The lowest concentration of SARS-CoV-2 viral copies this assay can detect is 138 copies/mL. A negative result does not preclude SARS-Cov-2 infection and should not be used as the sole basis for treatment or other patient management decisions. A negative result may occur with  improper specimen collection/handling, submission of specimen other than nasopharyngeal swab, presence of viral mutation(s) within the areas targeted by this assay, and inadequate number of viral copies(<138 copies/mL). A negative result must be combined with clinical observations, patient history, and epidemiological information. The expected result is Negative.  Fact Sheet for Patients:  BloggerCourse.com  Fact Sheet for Healthcare Providers:  SeriousBroker.it  This test is no t yet approved or cleared by  the [employer] and  has been authorized for detection and/or diagnosis of SARS-CoV-2 by FDA under an Emergency Use Authorization (EUA). This EUA will remain  in effect (meaning this test can be used) for the duration of the COVID-19 declaration under Section 564(b)(1) of the Act, 21 U.S.C.section 360bbb-3(b)(1), unless the authorization is terminated  or revoked sooner.       Influenza A by PCR NEGATIVE NEGATIVE Final   Influenza B by PCR NEGATIVE NEGATIVE Final    Comment: (NOTE) The Xpert Xpress SARS-CoV-2/FLU/RSV plus assay is intended as an aid in the diagnosis of influenza from Nasopharyngeal swab specimens and should not be used as a sole basis for treatment. Nasal washings and aspirates are unacceptable for  Xpert Xpress SARS-CoV-2/FLU/RSV testing.  Fact Sheet for Patients: BloggerCourse.com  Fact Sheet for Healthcare Providers: SeriousBroker.it  This test is not yet approved or cleared by the [place] FDA and has been authorized for detection and/or diagnosis of SARS-CoV-2 by FDA under an Emergency Use Authorization (EUA). This EUA will remain in effect (meaning this test can be used) for the duration of the COVID-19 declaration under Section 564(b)(1) of the Act, 21 U.S.C. section 360bbb-3(b)(1), unless the authorization is terminated or revoked.  Performed at [facility], [address], [place], [place] [address]     Labs: BNP (last 3 results) No results for input(s): BNP in the last 8760 hours. Basic Metabolic Panel: Recent Labs  Lab 07/31/20 1304 07/31/20 1309 08/01/20 0015 08/02/20 0246 08/03/20 0415 08/04/20 0329 08/05/20 0338  NA 137   < > 134* 137 138 136 136  K 4.5   < > 4.2 3.9 3.5 3.4* 3.2*  CL 103  --  105 104 102 99 99  CO2 23  --  22 25 29 29 26   GLUCOSE 123*  --  145* 95 101* 100* 106*  BUN 21  --  22 16 17 18 19   CREATININE 1.05*  --  0.88 0.84 0.90 0.88 0.93  CALCIUM 9.2  --  8.9 8.9 8.6* 8.5* 9.0  MG 2.2  --   --   --   --   --   --   PHOS 4.5  --   --   --   --   --   --    < > = values in this interval not displayed.   Liver Function Tests: Recent Labs  Lab 08/01/20 0015 08/02/20 0246 08/03/20 0415 08/04/20 0329 08/05/20 0338  AST 40 27 22 93* 62*  ALT 17 16 17  83* 77*  ALKPHOS 61 56 57 92 110  BILITOT 1.5* 1.3* 1.3* 1.6* 1.1  PROT 5.6* 5.6* 5.3* 5.4* 6.7  ALBUMIN 3.2* 3.2* 2.9* 2.9* 3.5   Recent Labs  Lab 07/31/20 1304  LIPASE 24   No results for input(s): AMMONIA in the last 168 hours. CBC: Recent Labs  Lab 07/31/20 1304 07/31/20 1309 08/01/20 0015 08/02/20 0246 08/03/20 0415 08/04/20 0329 08/05/20 0338  WBC 9.7  --  9.9 7.9 6.8 6.4 8.0  NEUTROABS 8.4*  --    --   --   --   --   --   HGB 13.8   < > 13.4 13.2 12.7 12.9 14.2  HCT 43.6   < > 40.3 40.5 39.9 40.5 45.3  MCV 90.8  --  88.2 90.4 90.1 90.2 90.6  PLT 243  --  207 200 196 220 281   < > = values in this interval  not displayed.   Cardiac Enzymes: Recent Labs  Lab 07/31/20 1304 08/01/20 0015  CKTOTAL 731* 448*   BNP: Invalid input(s): POCBNP CBG: Recent Labs  Lab 08/01/20 0417  GLUCAP 121*   D-Dimer No results for input(s): DDIMER in the last 72 hours. Hgb A1c No results for input(s): HGBA1C in the last 72 hours. Lipid Profile No results for input(s): CHOL, HDL, LDLCALC, TRIG, CHOLHDL, LDLDIRECT in the last 72 hours. Thyroid function studies No results for input(s): TSH, T4TOTAL, T3FREE, THYROIDAB in the last 72 hours.  Invalid input(s): FREET3 Anemia work up No results for input(s): VITAMINB12, FOLATE, FERRITIN, TIBC, IRON, RETICCTPCT in the last 72 hours. Urinalysis No results found for: COLORURINE, APPEARANCEUR, LABSPEC, PHURINE, GLUCOSEU, HGBUR, BILIRUBINUR, KETONESUR, PROTEINUR, UROBILINOGEN, NITRITE, LEUKOCYTESUR Sepsis Labs Invalid input(s): PROCALCITONIN,  WBC,  LACTICIDVEN Microbiology Recent Results (from the past 240 hour(s))  Resp Panel by RT-PCR (Flu A&B, Covid) Nasopharyngeal Swab     Status: None   Collection Time: 07/31/20  1:26 PM   Specimen: Nasopharyngeal Swab; Nasopharyngeal(NP) swabs in vial transport medium  Result Value Ref Range Status   SARS Coronavirus 2 by RT PCR NEGATIVE NEGATIVE Final    Comment: (NOTE) SARS-CoV-2 target nucleic acids are NOT DETECTED.  The SARS-CoV-2 RNA is generally detectable in upper respiratory specimens during the acute phase of infection. The lowest concentration of SARS-CoV-2 viral copies this assay can detect is 138 copies/mL. A negative result does not preclude SARS-Cov-2 infection and should not be used as the sole basis for treatment or other patient management decisions. A negative result may occur with   improper specimen collection/handling, submission of specimen other than nasopharyngeal swab, presence of viral mutation(s) within the areas targeted by this assay, and inadequate number of viral copies(<138 copies/mL). A negative result must be combined with clinical observations, patient history, and epidemiological information. The expected result is Negative.  Fact Sheet for Patients:  BloggerCourse.com  Fact Sheet for Healthcare Providers:  SeriousBroker.it  This test is no t yet approved or cleared by the [place] FDA and  has been authorized for detection and/or diagnosis of SARS-CoV-2 by FDA under an Emergency Use Authorization (EUA). This EUA will remain  in effect (meaning this test can be used) for the duration of the COVID-19 declaration under Section 564(b)(1) of the Act, 21 U.S.C.section 360bbb-3(b)(1), unless the authorization is terminated  or revoked sooner.       Influenza A by PCR NEGATIVE NEGATIVE Final   Influenza B by PCR NEGATIVE NEGATIVE Final    Comment: (NOTE) The Xpert Xpress SARS-CoV-2/FLU/RSV plus assay is intended as an aid in the diagnosis of influenza from Nasopharyngeal swab specimens and should not be used as a sole basis for treatment. Nasal washings and aspirates are unacceptable for Xpert Xpress SARS-CoV-2/FLU/RSV testing.  Fact Sheet for Patients: BloggerCourse.com  Fact Sheet for Healthcare Providers: SeriousBroker.it  This test is not yet approved or cleared by the [place] FDA and has been authorized for detection and/or diagnosis of SARS-CoV-2 by FDA under an Emergency Use Authorization (EUA). This EUA will remain in effect (meaning this test can be used) for the duration of the COVID-19 declaration under Section 564(b)(1) of the Act, 21 U.S.C. section 360bbb-3(b)(1), unless the authorization is terminated  or revoked.  Performed at [facility], [address], [place], [place] [address]    Time coordinating discharge: 35 minutes  SIGNED:  Merlene Laughter, DO [facility] 08/05/2020, 3:06 PM Pager is on AMION  If  7PM-7AM, please contact night-coverage [email or web address]

## 2020-08-05 NOTE — Progress Notes (Signed)
Patient unresponsive to name, unresponsive to sternal rubs but responsive to noxious stimuli. vital signs and blood sugar obtained and noted in chart. MAR notes patient as full code; however, niece [name] was contacted and confirmed patient is DNR and that there is signed paperwork in regards to this on file. On-call notified.   Rn unable to give meds as patient only awakens to noxious stimuli.

## 2020-08-05 NOTE — Plan of Care (Signed)
  Problem: Skin Integrity: Goal: Risk for impaired skin integrity will decrease Outcome: Progressing   Problem: Activity: Goal: Ability to tolerate increased activity will improve Outcome: Progressing   Problem: Cardiac: Goal: Ability to achieve and maintain adequate cardiopulmonary perfusion will improve Outcome: Progressing   Problem: Health Behavior/Discharge Planning: Goal: Ability to safely manage health-related needs after discharge will improve Outcome: Progressing

## 2020-08-05 NOTE — Progress Notes (Signed)
Patient arrived on unit. Rehab schedule, medications and plan of care reviewed, patient and patient niece state an understanding. Patient is AxO1 and has no complications noted at this time. Patient reports pain 0 out of 10. Patient educated on use of call light. Ellen Barajas

## 2020-08-06 ENCOUNTER — Encounter (HOSPITAL_COMMUNITY): Payer: Self-pay | Admitting: Physical Medicine and Rehabilitation

## 2020-08-06 ENCOUNTER — Inpatient Hospital Stay (HOSPITAL_COMMUNITY): Payer: Medicare Other

## 2020-08-06 DIAGNOSIS — I6612 Occlusion and stenosis of left anterior cerebral artery: Secondary | ICD-10-CM | POA: Diagnosis not present

## 2020-08-06 DIAGNOSIS — I639 Cerebral infarction, unspecified: Secondary | ICD-10-CM

## 2020-08-06 DIAGNOSIS — R531 Weakness: Secondary | ICD-10-CM

## 2020-08-06 DIAGNOSIS — Z7189 Other specified counseling: Secondary | ICD-10-CM

## 2020-08-06 DIAGNOSIS — Z515 Encounter for palliative care: Secondary | ICD-10-CM

## 2020-08-06 LAB — CBC WITH DIFFERENTIAL/PLATELET
Abs Immature Granulocytes: 0.04 10*3/uL (ref 0.00–0.07)
Basophils Absolute: 0 10*3/uL (ref 0.0–0.1)
Basophils Relative: 0 %
Eosinophils Absolute: 0.2 10*3/uL (ref 0.0–0.5)
Eosinophils Relative: 3 %
HCT: 40 % (ref 36.0–46.0)
Hemoglobin: 12.9 g/dL (ref 12.0–15.0)
Immature Granulocytes: 1 %
Lymphocytes Relative: 9 %
Lymphs Abs: 0.6 10*3/uL — ABNORMAL LOW (ref 0.7–4.0)
MCH: 28.8 pg (ref 26.0–34.0)
MCHC: 32.3 g/dL (ref 30.0–36.0)
MCV: 89.3 fL (ref 80.0–100.0)
Monocytes Absolute: 0.9 10*3/uL (ref 0.1–1.0)
Monocytes Relative: 12 %
Neutro Abs: 5.6 10*3/uL (ref 1.7–7.7)
Neutrophils Relative %: 75 %
Platelets: 251 10*3/uL (ref 150–400)
RBC: 4.48 MIL/uL (ref 3.87–5.11)
RDW: 14.4 % (ref 11.5–15.5)
WBC: 7.3 10*3/uL (ref 4.0–10.5)
nRBC: 0 % (ref 0.0–0.2)

## 2020-08-06 LAB — COMPREHENSIVE METABOLIC PANEL
ALT: 54 U/L — ABNORMAL HIGH (ref 0–44)
AST: 34 U/L (ref 15–41)
Albumin: 3.1 g/dL — ABNORMAL LOW (ref 3.5–5.0)
Alkaline Phosphatase: 91 U/L (ref 38–126)
Anion gap: 5 (ref 5–15)
BUN: 25 mg/dL — ABNORMAL HIGH (ref 8–23)
CO2: 28 mmol/L (ref 22–32)
Calcium: 8.8 mg/dL — ABNORMAL LOW (ref 8.9–10.3)
Chloride: 103 mmol/L (ref 98–111)
Creatinine, Ser: 0.9 mg/dL (ref 0.44–1.00)
GFR, Estimated: >60 mL/min (ref >60)
Glucose, Bld: 114 mg/dL — ABNORMAL HIGH (ref 70–99)
Potassium: 4.3 mmol/L (ref 3.5–5.1)
Sodium: 136 mmol/L (ref 135–145)
Total Bilirubin: 1 mg/dL (ref 0.3–1.2)
Total Protein: 5.4 g/dL — ABNORMAL LOW (ref 6.5–8.1)

## 2020-08-06 MED ORDER — HYDRALAZINE HCL 20 MG/ML IJ SOLN
5.0000 mg | Freq: Four times a day (QID) | INTRAMUSCULAR | Status: DC | PRN
  Filled 2020-08-06: qty 1

## 2020-08-06 MED ORDER — GADOBUTROL 1 MMOL/ML IV SOLN
7.0000 mL | Freq: Once | INTRAVENOUS | Status: AC | PRN
  Administered 2020-08-06: 7 mL via INTRAVENOUS

## 2020-08-06 MED ORDER — POTASSIUM CHLORIDE 10 MEQ/100ML IV SOLN
10.0000 meq | INTRAVENOUS | Status: AC
  Filled 2020-08-06 (×2): qty 100

## 2020-08-06 NOTE — Progress Notes (Signed)
Patient ID: Ellen Barajas, female   DOB: 26-Jun-1928, 87 y.o.   MRN: [id number] Met with the patient and niece to introduce self and the role of the nurse CM. Patient with new stroke ( Right occ area) post admit to rehab for Left MCA CVA. Patient awake, responsive although delayed, following commands and reporting dizziness. Continue to follow along to discharge to address educational needs and facilitate prep for discharge. Margarito Liner

## 2020-08-06 NOTE — Plan of Care (Signed)
  Problem: RH Attention Goal: LTG Patient will demonstrate this level of attention during functional activites (SLP) Description: LTG:  Patient will will demonstrate this level of attention during functional activites (SLP) 08/06/2020 1837 by Sonny Masters T, CCC-SLP Flowsheets (Taken 08/06/2020 1837) LTG: Patient will demonstrate this level of attention during cognitive/linguistic activities with assistance of (SLP): Minimal Assistance - Patient > 75% 08/06/2020 1833 by Sonny Masters T, CCC-SLP Flowsheets (Taken 08/06/2020 1833) Patient will demonstrate during cognitive/linguistic activities the attention type of: Sustained Patient will demonstrate this level of attention during cognitive/linguistic activities in: Controlled LTG: Patient will demonstrate this level of attention during cognitive/linguistic activities with assistance of (SLP): Moderate Assistance - Patient 50 - 74% Number of minutes patient will demonstrate attention during cognitive/linguistic activities: 25

## 2020-08-06 NOTE — Progress Notes (Signed)
Dr. Otelia Limes evaluated CT and felt that patient with new infarct-->concurs with MRI for definite diagnosis but would not change course of treatment. Discussed TEE but would not change treatment at patient already on [facility].

## 2020-08-06 NOTE — Consult Note (Signed)
Consultation Note Date: 08/06/2020   Patient Name: Ellen Barajas  DOB: July 21, 1928  MRN: [id number]  Age / Sex: 87 y.o., female  PCP: No primary care provider on file. Referring Physician: Horton Chin, MD  Reason for Consultation: Establishing goals of care "discuss hospice"  HPI/Patient Profile: 87 y.o. female  with past medical history of atrial fibrillation on Xarelto, CAD with CABG in 15-May-2000, hypertension, hyperlipidemia, and history of hip fracture who initially presented to the emergency department on 08/05/2020 with fall and altered mental status. She was admitted with NSTEMI Imaging also revealed multiple facial fractures, for which ENT recommended supportive care and anibiotics.  Initial head CT was negative for acute findings, but MRI brain on 7/9 revealed a moderate to large acute left ACA infarct.  Neurology was consulted.  Cardiology was consulted and recommended a TTE which showed an EF of 25% to 30%.  She was started on diuresis and resumed on her anticoagulation in addition to Plavix.  She was deemed medically stable to be discharged to CIR on 7/13.   Due to mental status changes overnight 7/13-7/14, patient was evaluated for possible new infarct. MRI did reveal new right PCA territory infarct, plus scattered small acute bilateral Cerebellar infarcts.  Clinical Assessment and Goals of Care: I have reviewed medical records including EPIC notes, labs and imaging, examined the patient, and discussed with the bedside RN who is in the room. Patient is currently refusing to take medications, which RN is finally able to get her to take with coaxing. She is not verbal while I am in the room, but RN reports she will answer "yes" and "no" appropriately to questions. She also participated in therapy sessions today.   I spoke with niece [name] by phone to discuss diagnosis, prognosis, GOC, EOL wishes,  disposition, and options. I introduced Palliative Medicine as specialized medical care for people living with serious illness. It focuses on providing relief from the symptoms and stress of a serious illness.   We discussed a brief life review of the patient. She is originally from [place] and has lived here her entire life. She has 2 sons - 1 lives in [place] and is is completely estranged while the other son lives in [place] but is only peripherally involved. Her husband passed away in 05/16/2018. Mrs. Lavin asked [name] to be her HCPOA about 8-9 years ago.   As far as functional status, prior to admission Mrs. Vader was living alone in her home. She was independent with her ADLs and able to do her housecleaning and meal preparation.   We discussed her current illness and what it means in the larger context of her ongoing co-morbidities. Discussed the natural disease trajectory of a sudden, severe neurological injury such as stroke. Discussed that patient will not likely return to her previous baseline. Provided education that prognostication can be challenging due to the uncertainty predicting neurologic outcome as well as the time required to allow for optimal recovery (weeks to sometimes months).   I attempted to  elicit values and goals of care important to the patient. Albin FellingCarla states Mrs. Earlene PlaterDavis made her promise to "never put her in a nursing home".   The difference between full scope medical intervention and comfort care was considered in light of patient's goals of care. I introduced the concept of a comfort path to [place], emphasizing that this path involves de-escalating and stopping full scope medical interventions, allowing a natural course to occur. Discussed that the goal is comfort and dignity rather than prolonging life. Albin FellingCarla expresses that Mrs. Earlene PlaterDavis would not want extraordinary or life-prolonging measures.   Provided education and counseling on the philosophy and benefits of hospice  care. Discussed that it offers a holistic approach to care in the setting of end-stage illness/disease, and is about supporting the patient where they are while allowing the natural course to occur. Hospice can help with symptom management and improve quality of life for patients and family. Discussed the hospice team includes RNs, physicians, social workers, and chaplains. They can provide personal care, support for the family, and help keep patient out of the hospital. Albin FellingCarla is familiar with hospice care it was utilized for her uncle (patient's husband). She agrees with hospice care at home when Mrs. Earlene PlaterDavis is discharged from CIR.   Advanced directives, concepts specific to code status, artifical feeding and hydration, and rehospitalization were considered and discussed. Albin FellingCarla states that Mrs. Earlene PlaterDavis was adamant about her wishes for "do not resuscitate" as well as no artifical feeding.   Questions and concerns were addressed.  Provided Albin FellingCarla with PMT contact information and encouraged her to call with questions or concerns.    Primary decision maker: niece Ashley MurrainCarla Chambers    SUMMARY OF RECOMMENDATIONS   DNR/DNI as previously documented Continue current care for now with watchful waiting If patient stabilizes, plan is for home with hospice upon discharge from CIR If patient declines, niece will likely transition to comfort care at that time with transfer to residential hospice Niece's choice for hospice agency is [facility] PMT will continue to follow  Code Status/Advance Care Planning: DNR  Palliative Prophylaxis:  Aspiration, Oral Care, and Turn Reposition  Additional Recommendations (Limitations, Scope, Preferences): No Artificial Feeding  Psycho-social/Spiritual:  Created space and opportunity for family to express thoughts and feelings regarding patient's current medical situation.  Emotional support provided   Prognosis:  < 6 months  Discharge Planning: Home with Hospice       Primary Diagnoses: Present on Admission:  Left middle cerebral artery stroke (HCC)   I have reviewed the medical record, interviewed the patient and family, and examined the patient. The following aspects are pertinent.  Past Medical History:  Diagnosis Date   AF (atrial fibrillation) (HCC)    coumadin   Arthritis    "hands" (09/27/2017)   Coronary artery disease 2002   s/p CABG   Diverticulosis    Dyslipidemia    History of chicken pox    IHD (ischemic heart disease)    Myocardial infarction (HCC) ~ 2002   Post herpetic neuralgia 02/2012    Family History  Problem Relation Age of Onset   CAD Father [id number]       MI   CAD Mother [id number]       MI   Scheduled Meds:  atorvastatin  40 mg Oral Daily   cephALEXin  500 mg Oral Q8H   clopidogrel  75 mg Oral Daily   furosemide  20 mg Oral Daily   losartan  25 mg Oral Daily  metoprolol tartrate  37.5 mg Oral BID   Rivaroxaban  15 mg Oral Q supper   Continuous Infusions: PRN Meds:.acetaminophen, albuterol, bisacodyl, hydrALAZINE, polyethylene glycol Medications Prior to Admission:  Prior to Admission medications   Medication Sig Start Date End Date Taking? Authorizing Provider  acetaminophen (TYLENOL) 325 MG tablet Take 2 tablets (650 mg total) by mouth every 6 (six) hours as needed for moderate pain (headache). 08/05/20   Marguerita Merles Latif, DO  atorvastatin (LIPITOR) 40 MG tablet Take 1 tablet (40 mg total) by mouth daily. 08/06/20   Marguerita Merles Latif, DO  bisacodyl (DULCOLAX) 10 MG suppository Place 1 suppository (10 mg total) rectally daily as needed for moderate constipation. 08/05/20   Marguerita Merles Latif, DO  cephALEXin (KEFLEX) 500 MG capsule Take 1 capsule (500 mg total) by mouth every 8 (eight) hours for 2 days. 08/05/20 08/07/20  Marguerita Merles Latif, DO  clopidogrel (PLAVIX) 75 MG tablet Take 1 tablet (75 mg total) by mouth daily. 08/06/20   Marguerita Merles Latif, DO  furosemide (LASIX) 20 MG tablet Take 1 tablet (20 mg total) by  mouth daily. 08/06/20   Marguerita Merles Latif, DO  losartan (COZAAR) 25 MG tablet Take 1 tablet by mouth once daily 10/08/19   Nahser, Deloris Ping, MD  Metoprolol Tartrate 37.5 MG TABS Take 37.5 mg by mouth 2 (two) times daily. 08/05/20   Marguerita Merles Latif, DO  polyethylene glycol (MIRALAX / GLYCOLAX) 17 g packet Take 17 g by mouth daily as needed for mild constipation. 08/05/20   Marguerita Merles Latif, DO  Rivaroxaban (XARELTO) 15 MG TABS tablet Take 1 tablet (15 mg total) by mouth daily with supper. 11/28/19   Nahser, Deloris Ping, MD   Allergies  Allergen Reactions   Crestor [Rosuvastatin Calcium] Other (See Comments)    unknown   Lipitor [Atorvastatin Calcium] Other (See Comments)    Unknown. Tolerated inpatient 07/2020.   Niaspan [Niacin Er] Other (See Comments)    unknown   Other Other (See Comments)    PER PATIENT SHE DOES NOT TOLERATE NARCOTICS   Pravastatin Other (See Comments)    unknown   Ramipril [Ramipril] Other (See Comments)    Hyperkalemia   Welchol [Colesevelam Hcl] Other (See Comments)    unknown   Zetia [Ezetimibe] Other (See Comments)    unknown   Review of Systems  Unable to perform ROS: Other   Physical Exam Vitals reviewed.  Constitutional:      General: She is not in acute distress.    Appearance: She is ill-appearing.  Pulmonary:     Effort: Pulmonary effort is normal.  Neurological:     Mental Status: She is alert.     Motor: Weakness present.     Comments: Minimally verbal  Psychiatric:        Mood and Affect: Affect is flat.    Vital Signs: BP 130/81 (BP Location: Left Arm)   Pulse 94   Temp 98.2 F (36.8 C)   Resp 18   SpO2 96%  Pain Scale: Faces   Pain Score: 0-No pain   SpO2: SpO2: 96 % O2 Device:SpO2: 96 % O2 Flow Rate: .   IO: Intake/output summary:  Intake/Output Summary (Last 24 hours) at 08/06/2020 1440 Last data filed at 08/06/2020 1230 Gross per 24 hour  Intake 236 ml  Output --  Net 236 ml    LBM: Last BM Date:  08/05/20 Baseline Weight:   Most recent weight:        Palliative  Assessment/Data: PPS 40%     Time In: 1545 Time Out: 1656 Time Total: 71 minutes Greater than 50%  of this time was spent counseling and coordinating care related to the above assessment and plan.  Signed by: [name], NP   Please contact Palliative Medicine Team phone at [phone] for questions and concerns.  For individual provider: See Loretha Stapler

## 2020-08-06 NOTE — Progress Notes (Signed)
Inpatient Rehabilitation  Patient information reviewed and entered into eRehab system by Eleena Grater M. Leny Morozov, M.A., CCC/SLP, PPS Coordinator.  Information including medical coding, functional ability and quality indicators will be reviewed and updated through discharge.    

## 2020-08-06 NOTE — Evaluation (Signed)
Physical Therapy Assessment and Plan  Patient Details  Name: Ellen Barajas MRN: [id number] Date of Birth: 03/20/1928  PT Diagnosis: Abnormal posture, Abnormality of gait, Cognitive deficits, Difficulty walking, Impaired cognition, Impaired sensation, and Muscle weakness Rehab Potential: Fair ELOS: 2-3 weeks   Today's Date: 08/06/2020 PT Individual Time: 1330-1430 PT Individual Time Calculation (min): 60 min    Hospital Problem: Principal Problem:   Embolism of left anterior cerebral artery Active Problems:   Left middle cerebral artery stroke [facility])   Past Medical History:  Past Medical History:  Diagnosis Date   AF (atrial fibrillation) ([place])    coumadin   Arthritis    "hands" (09/27/2017)   Coronary artery disease 2002   s/p CABG   Diverticulosis    Dyslipidemia    History of chicken pox    IHD (ischemic heart disease)    Myocardial infarction ([place]) ~ 2002   Post herpetic neuralgia 02/2012   Past Surgical History:  Past Surgical History:  Procedure Laterality Date   BREAST LUMPECTOMY Left 1992   excision of fibrous tumor/notes 06/19/2000   CARDIAC CATHETERIZATION  06/08/2000   CATARACT EXTRACTION W/ INTRAOCULAR LENS  IMPLANT, BILATERAL Bilateral 2013   CORONARY ARTERY BYPASS GRAFT  2002   x 4   EYE SURGERY Bilateral 1989   FEMUR IM NAIL Left 09/28/2017   Procedure: INTRAMEDULLARY (IM) NAIL FEMORAL;  Surgeon: Renette Butters, MD;  Location: [place];  Service: Orthopedics;  Laterality: Left;   INTRAMEDULLARY (IM) NAIL INTERTROCHANTERIC Right 11/22/2018   Procedure: INTRAMEDULLARY (IM) NAIL INTERTROCHANTRIC;  Surgeon: Renette Butters, MD;  Location: [place];  Service: Orthopedics;  Laterality: Right;    Assessment & Plan Clinical Impression: Patient is  a 87 year old right-handed female with history of atrial fibrillation on Xarelto, CAD with CABG 2002, postherpetic neuralgia, hypertension, hyperlipidemia as well as history of hip fracture 2020.  Per chart review patient  lives alone.  Independent with assistive device.  She does not drive.  Niece and family assist as needed.  Presents 07/31/2020 after being found down with acute onset of right-sided weakness facial droop and aphasia.  She did have bruising to the right side of her face and eye.  Cranial CT scan negative for acute changes.  CT cervical spine as well as maxillofacial showed mildly displaced fractures of the right zygomatic arch, anterior and posterior lateral walls of the right maxillary sinus and possible diastasis of the right frontal zygomatic suture and conservative care was recommended and placed on empiric Keflex per ENT DrJuengel.  No other facial fractures.  Overlying right facial soft tissue swelling without orbital hematoma or foreign body.  CT cervical spine negative.  CT angiogram head and neck negative for large vessel occlusion.  There was a 2 mm A -comm aneurysm.  Patient did not receive tPA.  MRI identified moderate to large acute left ACA branch infarction.  Tiny acute infarct in the left anterior temporal cortex.  Echocardiogram with ejection fraction of 25 to 30%.  The left ventricle had severely decreased function.  Admission chemistries unremarkable except creatinine 1.05, troponin [id number], CK 731, lactic acid 1.9.  Cardiology service was consulted for elevated troponin.  EKG overall unchanged from previous tracings and no ischemic changes.  It was felt elevated troponin in the setting of fall and being down for an extended amount of time.  Awaiting plan for possible need for 30-day cardiac event monitor.  Patient was cleared to continue chronic Xarelto and Plavix was added for history of  CAD/non-STEMI.  No further work-up at this time was indicated.  Close monitoring of heart rate with history of atrial fibrillation and adjustments made in beta-blocker.  Patient is tolerating a regular consistency diet.  Therapy evaluations completed due to patient's right side weakness and aphasia was admitted  for a comprehensive rehab program.Patient transferred to CIR on 08/05/2020 .   Patient currently requires max with mobility secondary to muscle weakness, decreased cardiorespiratoy endurance, impaired timing and sequencing, abnormal tone, unbalanced muscle activation, motor apraxia, decreased coordination, and decreased motor planning, decreased visual perceptual skills and R gaze preference, decreased midline orientation, decreased attention to left, and decreased motor planning, decreased initiation, decreased attention, decreased awareness, decreased problem solving, decreased safety awareness, decreased memory, and delayed processing, and decreased sitting balance, decreased standing balance, decreased postural control, hemiplegia, and decreased balance strategies.  Prior to hospitalization, patient was modified independent  with mobility and lived with Alone in a House home.  Home access is 3Stairs to enter.  Patient will benefit from skilled PT intervention to maximize safe functional mobility, minimize fall risk, and decrease caregiver burden for planned discharge home with 24 hour assist.  Anticipate patient will benefit from follow up [facility] at discharge.  PT - End of Session Activity Tolerance: Tolerates 30+ min activity with multiple rests Endurance Deficit: Yes Endurance Deficit Description: Global weakness and deconditioning PT Assessment Rehab Potential (ACUTE/IP ONLY): Fair PT Barriers to Discharge: Decreased caregiver support;Home environment access/layout;Lack of/limited family support;Insurance for SNF coverage;Weight;Incontinence PT Patient demonstrates impairments in the following area(s): Balance;Behavior;Endurance;Motor;Nutrition;Safety;Perception;Sensory;Skin Integrity PT Transfers Functional Problem(s): Bed Mobility;Bed to Chair;Car PT Locomotion Functional Problem(s): Ambulation;Stairs PT Plan PT Intensity: Minimum of 1-2 x/day ,45 to 90 minutes PT Frequency: 5 out of 7 days PT  Duration Estimated Length of Stay: 2-3 weeks PT Treatment/Interventions: Ambulation/gait training;Discharge planning;Functional mobility training;Psychosocial support;Therapeutic Activities;Visual/perceptual remediation/compensation;Wheelchair propulsion/positioning;Therapeutic Exercise;Skin care/wound management;Neuromuscular re-education;Disease management/prevention;Balance/vestibular training;Cognitive remediation/compensation;DME/adaptive equipment instruction;Pain management;Splinting/orthotics;UE/LE Strength taining/ROM;UE/LE Coordination activities;Stair training;Patient/family education;Community reintegration;Functional electrical stimulation PT Transfers Anticipated Outcome(s): minA with LRAD PT Locomotion Anticipated Outcome(s): minA with LRAD PT Recommendation Follow Up Recommendations: Home health PT Patient destination: Home Equipment Recommended: To be determined   PT Evaluation Precautions/Restrictions Precautions Precautions: Fall Precaution Comments: expressive aphasia, very hard of hearing (lost left hearing aid); left inattention; right pusher; right sided weakness/apraxia Restrictions Weight Bearing Restrictions: No General Chart Reviewed: Yes Family/Caregiver Present: No  Home Living/Prior Functioning Home Living Living Arrangements: Alone Available Help at Discharge: Family;Available 24 hours/day;Personal care attendant;Other (Comment) (Will be hiring private care for 24/7 coverage) Type of Home: House Home Access: Stairs to enter [employer] of Steps: 3 Entrance Stairs-Rails: Left Home Layout: One level Bathroom Shower/Tub: Chiropodist: Standard Additional Comments: lives alone at baseline with family check-ins from niece  Lives With: Alone Prior Function Level of Independence: Independent with basic ADLs;Independent with transfers;Independent with homemaking with ambulation;Independent with gait  Able to Take Stairs?:  Yes Driving: No Comments: uses quad cane for ambulation, does not drive. Neice brings grocery.  Patient continued to be independent for ADL completion, med management, meal prep and home management.  Niece assists with bill payment and community mobility. Vision/Perception  Vision - Assessment Eye Alignment: Impaired (comment) Ocular Range of Motion: Impaired-to be further tested in functional context Alignment/Gaze Preference: Head turned;Gaze right Tracking/Visual Pursuits: Impaired - to be further tested in functional context;Other (comment) Saccades: Impaired - to be further tested in functional context;Other (comment) Perception Perception: Impaired Inattention/Neglect: Does not attend to right side of body;Does not attend  to left visual field Praxis Praxis: Impaired Praxis Impairment Details: Initiation;Motor planning;Perseveration;Ideation  Cognition Overall Cognitive Status: Impaired/Different from baseline Arousal/Alertness: Awake/alert Orientation Level: Other (comment) (self (name and D.O.B) only) Attention: Focused;Sustained Focused Attention: Impaired Focused Attention Impairment: Verbal basic;Functional basic Sustained Attention: Impaired Sustained Attention Impairment: Verbal basic;Functional basic Memory:  (UTA due to aphasia/communication difficulties) Immediate Memory Recall:  (UTA due to aphasia/communication difficulties) Memory Recall Sock:  (UTA due to aphasia/communication difficulties) Memory Recall Blue:  (UTA due to aphasia/communication difficulties) Memory Recall Bed:  (UTA due to aphasia/communication difficulties) Awareness: Impaired Awareness Impairment: Intellectual impairment;Emergent impairment Problem Solving: Impaired Problem Solving Impairment: Functional basic;Verbal basic Executive Function: Sequencing;Initiating Sequencing: Impaired Sequencing Impairment: Functional basic Initiating: Impaired Initiating Impairment: Verbal basic;Functional  basic Safety/Judgment: Impaired Sensation Sensation Light Touch: Impaired by gross assessment Hot/Cold: Not tested Proprioception: Impaired by gross assessment Stereognosis: Not tested Additional Comments: Unable to accurately assess due to aphasia and cog deficits Coordination Gross Motor Movements are Fluid and Coordinated: No Fine Motor Movements are Fluid and Coordinated: No Coordination and Movement Description: Blocked movement patterns with possible extensor tone in RLE. Finger Nose Finger Test: Slow LUE; UTA RUE due to weakness/apraxia Heel Shin Test: UTA due to weakness and aphasia Motor  Motor Motor: Hemiplegia;Abnormal tone;Abnormal postural alignment and control;Motor impersistence Motor - Skilled Clinical Observations: right sided   Trunk/Postural Assessment  Cervical Assessment Cervical Assessment: Within Functional Limits Thoracic Assessment Thoracic Assessment: Within Functional Limits Lumbar Assessment Lumbar Assessment: Exceptions to [facility] (post pelvic tilt) Postural Control Postural Control: Deficits on evaluation (delayed and inadequate)  Balance Balance Balance Assessed: Yes Static Sitting Balance Static Sitting - Balance Support: Feet supported;No upper extremity supported Static Sitting - Level of Assistance: 4: Min assist Dynamic Sitting Balance Dynamic Sitting - Balance Support: Feet supported;No upper extremity supported;During functional activity Dynamic Sitting - Level of Assistance: 3: Mod assist Static Standing Balance Static Standing - Balance Support: Bilateral upper extremity supported Static Standing - Level of Assistance: 3: Mod assist Dynamic Standing Balance Dynamic Standing - Balance Support: Bilateral upper extremity supported Dynamic Standing - Level of Assistance: 2: Max assist Extremity Assessment  RUE Assessment RUE Assessment: Exceptions to [facility] LUE Assessment LUE Assessment: Within Functional Limits RLE Assessment RLE  Assessment: Exceptions to [facility] General Strength Comments: Difficult to assess due to cognitive deficits and aphasia. Ankle DF 2/5, knee ext 3+/5, hip flex 2/5 LLE Assessment LLE Assessment: Exceptions to [facility] General Strength Comments: Difficult to assess due to cognitive deficits and aphasia. Ankle DF 4/5, knee ext 4/5, hip flex 3+/5  Care Tool Care Tool Bed Mobility Roll left and right activity   Roll left and right assist level: Maximal Assistance - Patient 25 - 49%    Sit to lying activity   Sit to lying assist level: Maximal Assistance - Patient 25 - 49%    Lying to sitting edge of bed activity   Lying to sitting edge of bed assist level: Maximal Assistance - Patient 25 - 49%     Care Tool Transfers Sit to stand transfer   Sit to stand assist level: Moderate Assistance - Patient 50 - 74%    Chair/bed transfer   Chair/bed transfer assist level: Maximal Assistance - Patient 25 - 49%     Psychologist, counselling transfer activity did not occur: Environmental limitations (room being used for group therapy)        Care Tool Locomotion Ambulation   Assist level: Moderate Assistance -  Patient 50 - 74% Assistive device: Parallel bars Max distance: 15f  Walk 10 feet activity Walk 10 feet activity did not occur: Safety/medical concerns       Walk 50 feet with 2 turns activity Walk 50 feet with 2 turns activity did not occur: Safety/medical concerns      Walk 150 feet activity Walk 150 feet activity did not occur: Safety/medical concerns      Walk 10 feet on uneven surfaces activity Walk 10 feet on uneven surfaces activity did not occur: Safety/medical concerns      Stairs Stair activity did not occur: Safety/medical concerns        Walk up/down 1 step activity Walk up/down 1 step or curb (drop down) activity did not occur: Safety/medical concerns     Walk up/down 4 steps activity did not occuR: Safety/medical concerns  Walk up/down 4 steps activity       Walk up/down 12 steps activity Walk up/down 12 steps activity did not occur: Safety/medical concerns      Pick up small objects from floor Pick up small object from the floor (from standing position) activity did not occur: Safety/medical concerns      Wheelchair Will patient use wheelchair at discharge?: No   Wheelchair activity did not occur: N/A      Wheel 50 feet with 2 turns activity Wheelchair 50 feet with 2 turns activity did not occur: N/A    Wheel 150 feet activity Wheelchair 150 feet activity did not occur: N/A      Refer to Care Plan for Long Term Goals  SHORT TERM GOAL WEEK 1 PT Short Term Goal 1 (Week 1): Pt will complete bed mobility with modA PT Short Term Goal 2 (Week 1): Pt will complete bed<>chair transfers wtih modA and LRAD PT Short Term Goal 3 (Week 1): Pt will ambulate 254fwith modA and LRAD  Recommendations for other services: None   Skilled Therapeutic Intervention Mobility Bed Mobility Bed Mobility: Rolling Right;Right Sidelying to Sit;Rolling Left Rolling Right: Total Assistance - Patient < 25% Rolling Left: Minimal Assistance - Patient > 75% Right Sidelying to Sit: Total Assistance - Patient < 25% Transfers Transfers: Sit to Stand;Stand to Sit;Stand Pivot Transfers Sit to Stand: Moderate Assistance - Patient 50-74% Stand to Sit: Moderate Assistance - Patient 50-74% Stand Pivot Transfers: Maximal Assistance - Patient 25 - 49% Stand Pivot Transfer Details: Tactile cues for initiation;Tactile cues for placement;Tactile cues for weight beaing;Tactile cues for weight shifting;Tactile cues for posture;Visual cues/gestures for precautions/safety;Verbal cues for precautions/safety;Verbal cues for technique;Verbal cues for sequencing;Verbal cues for gait pattern;Visual cues/gestures for sequencing;Manual facilitation for weight shifting;Manual facilitation for placement;Manual facilitation for weight bearing Stand Pivot Transfer Details (indicate cue  type and reason): Difficulty moving RLE into steps for sequencing and required TC for initiation Squat Pivot Transfers: Maximal Assistance - Patient 25-49% Transfer (Assistive device): 1 person hand held assist Transfer via Lift Equipment: StProbation officermbulation: Yes Gait Assistance: Moderate Assistance - Patient 50-74% Gait Distance (Feet): 8 Feet Assistive device: Parallel bars Gait Assistance Details: Tactile cues for initiation;Tactile cues for placement;Tactile cues for weight beaing;Tactile cues for sequencing;Tactile cues for posture;Tactile cues for weight shifting;Verbal cues for precautions/safety;Verbal cues for technique;Manual facilitation for weight shifting;Verbal cues for sequencing;Visual cues/gestures for sequencing;Verbal cues for gait pattern;Manual facilitation for weight bearing;Manual facilitation for placement Gait Assistance Details: hand-over-hand assist for guiding RUE along // bar. Required facilitation for iniatiting R stepping pattern Gait Gait: Yes Gait Pattern: Impaired Gait Pattern: Step-to pattern;Decreased  stance time - right;Decreased step length - right;Decreased step length - left;Decreased hip/knee flexion - right;Decreased hip/knee flexion - left;Decreased weight shift to right;Decreased dorsiflexion - right;Narrow base of support;Poor foot clearance - right;Trunk flexed Gait velocity: Decreased Stairs / Additional Locomotion Stairs: No Wheelchair Mobility Wheelchair Mobility: No  Skilled Intervention: Pt received sleeping in bed to start session. She awakens to voice but requires extra time to wake up. [name] agreeable to PT evaluation. [name] alert and oriented to self only (name and D.O.B, unable to provide age). With options, she's unable to correctly choose "hospital" for location. She is unable to provide year/date and is unaware of the situation. She has delayed initation and processing with difficulty completing sentences and forming  thoughts. She was noted to be incontinent of urine on arrival and required totalA for pericare and brief change at bed level. She was able to roll in bed with maxA with use of bed features but was unable to maintain sidelying position without assist. Completed supine<>sit with HOB flat and maxA for trunk elevation and BLE management. Requires minA for sitting balance which progresses to CGA with time and cues for midline. Completes sit<>stand with modA and R knee block and requires totalA for bringing pants up over hips in standing and modA for balance. Completes stand<>pivot transfer with maxA from EOB to w/c with resistance/pushing noted. Transported in w/c for time management to main rehab gym and wheeled inside // bars. Requires modA for sit<>stand in // bars and hand-over-hand assist for RUE to bar - she continues to require assist for guiding RUE forward along bar during gait training. She ambulated ~67f in // bars with modA overall, requiring facilitation for advancing RLE into swing but once initiation began she was able to swing through. At this time, pt noted to be incontinent of bowel. She was wheeled back to room and assisted back to bed via stand<>pivot with mod/maxA. Returned to supine position with maxA and made comfortable in the bed. Notified RN and NT to assist for bowel incontinence. Pt ended session supine in bed with bed alarm on and all needs met.   Instructed pt in results of PT evaluation as detailed above, PT POC, rehab potential, rehab goals, and discharge recommendations. Additionally discussed CIR's policies regarding fall safety and use of chair alarm and/or quick release belt. Pt verbalized understanding and in agreement. Will update pt's family members as they become available.   Discharge Criteria: Patient will be discharged from PT if patient refuses treatment 3 consecutive times without medical reason, if treatment goals not met, if there is a change in medical status, if  patient makes no progress towards goals or if patient is discharged from hospital.  The above assessment, treatment plan, treatment alternatives and goals were discussed and mutually agreed upon: by patient  CAlger SimonsPT, DPT 08/06/2020, 2:32 PM

## 2020-08-06 NOTE — Progress Notes (Signed)
Patient alert and following commands, but refusing to take meds orally. Spoke with on call and advised of patient status (alert and moving limbs) and that patient has not recvd scheduled meds.

## 2020-08-06 NOTE — Progress Notes (Signed)
As I was returning a patient to the department, some of the nursing staff asked me to assess Ms. Havey because she was difficult to arouse. Patient did not respond to verbal stimuli but grimaced and localized with her left hand with noxious stimuli. She was spontaneously breathing and vital signs were normal. CBG 167.  The staff was unclear of the patient's code status because the patient had a purple DNR bracelet on (which turned out to be accurate) and the order in the computer was for full code. I instructed the staff to clarify with the provider in case the patient's status declined since they were already contacting the provider regarding the change in mental status. Unknown LSW and no clear focal deficit so no code stroke initiated. When Marissa Nestle PA was contacted over the phone, she instructed the staff to contact the POA or NOK for clarification regarding code status. In addition, there is no documentation of why the code status was changed or conversation with NOK related to code status upon admission to CIR. Later the code status was clarified with the patient's niece (POA) reportedly per the nursing staff and DNR status was reinstated.

## 2020-08-06 NOTE — Progress Notes (Signed)
Spoke with radiology: advised CT scan revealed R occipital infarc and recommends MRI.

## 2020-08-06 NOTE — Progress Notes (Signed)
[facility]patient Rehabilitation Center Individual Statement of Services  Patient Name:  Ellen Barajas  Date:  08/06/2020  Welcome to the [facility]patient Rehabilitation [facility].  Our goal is to provide you with an individualized program based on your diagnosis and situation, designed to meet your specific needs.  With this comprehensive rehabilitation program, you will be expected to participate in at least 3 hours of rehabilitation therapies Monday-Friday, with modified therapy programming on the weekends.  Your rehabilitation program will include the following services:  Physical Therapy (PT), Occupational Therapy (OT), Speech Therapy (ST), 24 hour per day rehabilitation nursing, Therapeutic Recreaction (TR), Care Coordinator, Rehabilitation Medicine, Nutrition Services, and Pharmacy Services  Weekly team conferences will be held on Tuesday to discuss your progress.  Your Inpatient Rehabilitation Care Coordinator will talk with you frequently to get your input and to update you on team discussions.  Team conferences with you and your family in attendance may also be held.  Expected length of stay: 2-3 weeks  Overall anticipated outcome: CGA-min assist level  Depending on your progress and recovery, your program may change. Your Inpatient Rehabilitation Care Coordinator will coordinate services and will keep you informed of any changes. Your Inpatient Rehabilitation Care Coordinator's name and contact numbers are listed  below.  The following services may also be recommended but are not provided by the Inpatient Rehabilitation Center:   Home Health Rehabiltiation Services Outpatient Rehabilitation Services    Arrangements will be made to provide these services after discharge if needed.  Arrangements include referral to agencies that provide these services.  Your insurance has been verified to be:  [employer] primary doctor is:  Dr. Esmeralda Arthur  Pertinent information will be shared with your doctor  and your insurance company.  Inpatient Rehabilitation Care Coordinator:  Dossie Der, [place] [phone] or Luna Glasgow  Information discussed with and copy given to patient by: Lucy Chris, 08/06/2020, 10:56 AM

## 2020-08-06 NOTE — Progress Notes (Addendum)
RN  and charge RN made several attempts to give patient 2000 med. Each time, the patient pushed my hand away and placed her hand over her mouth and kept it there.

## 2020-08-06 NOTE — Evaluation (Signed)
Speech Language Pathology Assessment and Plan  Patient Details  Name: Ellen Barajas MRN: [id number] Date of Birth: Apr 08, 1928  SLP Diagnosis: Aphasia;Dysphagia;Cognitive Impairments;Speech and Language deficits  Rehab Potential: Good ELOS: 2-3 weeks   Today's Date: 08/06/2020 SLP Individual Time: 0800-0900 SLP Individual Time Calculation (min): 60 min   Hospital Problem: Principal Problem:   Embolism of left anterior cerebral artery Active Problems:   Left middle cerebral artery stroke [facility])  Past Medical History:  Past Medical History:  Diagnosis Date   AF (atrial fibrillation) ([place])    coumadin   Arthritis    "hands" (09/27/2017)   Coronary artery disease 2002   s/p CABG   Diverticulosis    Dyslipidemia    History of chicken pox    IHD (ischemic heart disease)    Myocardial infarction ([place]) ~ 2002   Post herpetic neuralgia 02/2012   Past Surgical History:  Past Surgical History:  Procedure Laterality Date   BREAST LUMPECTOMY Left 1992   excision of fibrous tumor/notes 06/19/2000   CARDIAC CATHETERIZATION  06/08/2000   CATARACT EXTRACTION W/ INTRAOCULAR LENS  IMPLANT, BILATERAL Bilateral 2013   CORONARY ARTERY BYPASS GRAFT  2002   x 4   EYE SURGERY Bilateral 1989   FEMUR IM NAIL Left 09/28/2017   Procedure: INTRAMEDULLARY (IM) NAIL FEMORAL;  Surgeon: Renette Butters, MD;  Location: [place];  Service: Orthopedics;  Laterality: Left;   INTRAMEDULLARY (IM) NAIL INTERTROCHANTERIC Right 11/22/2018   Procedure: INTRAMEDULLARY (IM) NAIL INTERTROCHANTRIC;  Surgeon: Renette Butters, MD;  Location: [place];  Service: Orthopedics;  Laterality: Right;    Assessment / Plan / Recommendation Clinical Impression Ellen Barajas is a 87 year old right-handed female with history of atrial fibrillation on Xarelto, CAD with CABG 2002, postherpetic neuralgia, hypertension, hyperlipidemia as well as history of hip fracture 2020.  Per chart review patient lives alone.  Independent with assistive  device.  She does not drive.  Niece and family assist as needed.  Presents 07/31/2020 after being found down with acute onset of right-sided weakness facial droop and aphasia.  She did have bruising to the right side of her face and eye.  Cranial CT scan negative for acute changes.  CT cervical spine as well as maxillofacial showed mildly displaced fractures of the right zygomatic arch, anterior and posterior lateral walls of the right maxillary sinus and possible diastasis of the right frontal zygomatic suture and conservative care was recommended and placed on empiric Keflex per ENT DrJuengel.  No other facial fractures. Overlying right facial soft tissue swelling without orbital hematoma or foreign body. CT cervical spine negative.  CT angiogram head and neck negative for large vessel occlusion. There was a 2 mm A -comm aneurysm.  Patient did not receive tPA.  MRI identified moderate to large acute left ACA branch infarction.  Tiny acute infarct in the left anterior temporal cortex. The left ventricle had severely decreased function. Close monitoring of heart rate with history of atrial fibrillation and adjustments made in beta-blocker. Patient is tolerating a regular consistency diet. Therapy evaluations completed due to patient's right side weakness and aphasia was admitted for a comprehensive rehab program. CT obtained 7/14 revealing new R occipital infarct.   Patient presents with expressive and receptive aphasia as evidenced by halting speech with significant word finding difficulty, phonemic paraphasias, neologisms, impaired initiation, and reduced spontaneous speech consisting of 3-4 word phrase utterances. Able to answer basic biographical questions and elicit yes/no responses to basic needs. Able to follow simple 1  step commands but difficulty with 2+ step commands and responding to moderately complex open ended questions and yes/no questions. Motor speech production was clear without indication or  concerns for dysarthria. Comprehensive language assessment was limited due to time constraints and patient nursing needs. Further language evaluation is recommended in future sessions. From a cognitive standpoint, patient exhibited left neglect and impaired sustained attention. She was oriented to person but disoriented to all other concepts although difficult to assess accuracy secondary to language deficits.   BSE: Patient presents with decline in swallow function since last ST encounter in acute care on 7/13. Patient consumed thin liquids by cup x4 with suspected mild swallow delay with no overt s/sx of aspiration and clear vocal quality post swallows. Consumed straw sips x2 with delayed cough during one occasion. Consumed Dys 1 textures with effective oral manipulation and clearance and without  pharyngeal symptoms. Consumed Dys 2 with prolonged mastication with minimal rotary movement, incomplete bolus breakdown, and lingual incoordination impacting AP transit. Patient without s/sx of aspiration with Dys 2 textures, however notable for delayed throat clearing. Patient was observed taking her medication with Nurse present. Tolerated whole with applesauce with no observable difficulty and adequate oral clearance. Recommend Dys 1 diet, thin liquids, no straws allowed. Pills whole in puree and taken one-at-a-time. Patient was dependent for self-feeding thus recommended for full supervision and feeding assist during meals and implementation of swallow strategies.   Patient will benefit from skilled SLP intervention to maximize communication/language, cognitive, swallow function, and decrease caregiver burden for planned discharge home with 24 hour assist. Anticipate patient will benefit from follow up [place] at discharge.    Skilled Therapeutic Interventions          SLP administered informal language and cognitive assessment, BSE, and education with family on results of assessments, diet recommendations, safe  swallow precautions and strategies, and discussion on plan of care. Patient was left with OT at end of session.    SLP Assessment  Patient will need skilled Speech Lanaguage Pathology Services during CIR admission    Recommendations  SLP Diet Recommendations: Dysphagia 1 (Puree);Thin Liquid Administration via: Cup;No straw Medication Administration: Whole meds with puree Supervision: Staff to assist with self feeding;Full supervision/cueing for compensatory strategies Compensations: Slow rate;Small sips/bites Postural Changes and/or Swallow Maneuvers: Seated upright 90 degrees Oral Care Recommendations: Oral care BID Patient destination: Home Follow up Recommendations: 24 hour supervision/assistance;Home Health SLP Equipment Recommended: None recommended by SLP    SLP Frequency 3 to 5 out of 7 days   SLP Duration  SLP Intensity  SLP Treatment/Interventions 2-3 weeks  Minumum of 1-2 x/day, 30 to 90 minutes  Cognitive remediation/compensation;Environmental controls;Internal/external aids;Multimodal communication approach;Speech/Language facilitation;Cueing hierarchy;Dysphagia/aspiration precaution training;Functional tasks;Patient/family education;Therapeutic Activities    Pain Pain Assessment Pain Scale: 0-10 Pain Score: 0-No pain Faces Pain Scale: No hurt  Prior Functioning Cognitive/Linguistic Baseline: Within functional limits Type of Home: House  Lives With: Alone Available Help at Discharge: Family;Available 24 hours/day;Personal care attendant;Other (Comment)  SLP Evaluation Cognition Overall Cognitive Status: Impaired/Different from baseline Arousal/Alertness: Awake/alert Orientation Level: Oriented to person;Disoriented to place;Disoriented to time;Disoriented to situation;Other (comment) (difficult to assess accuracy secondary to language deficits) Attention: Sustained;Focused Focused Attention: Impaired Focused Attention Impairment: Verbal basic;Functional  basic Sustained Attention: Impaired Sustained Attention Impairment: Verbal basic;Functional basic Awareness: Impaired Awareness Impairment: Intellectual impairment Problem Solving: Impaired Problem Solving Impairment: Verbal basic;Functional basic Initiating: Impaired Safety/Judgment: Impaired  Comprehension Auditory Comprehension Overall Auditory Comprehension: Impaired Yes/No Questions: Impaired Basic Biographical Questions: 51-75% accurate  Basic Immediate Environment Questions: 25-49% accurate Complex Questions: 0-24% accurate Commands: Impaired One Step Basic Commands: 50-74% accurate Two Step Basic Commands: 0-24% accurate Interfering Components: Hearing;Attention EffectiveTechniques: Extra processing time;Repetition Visual Recognition/Discrimination Discrimination: Not tested Reading Comprehension Reading Status: Not tested Expression Expression Primary Mode of Expression: Verbal Verbal Expression Overall Verbal Expression: Impaired Initiation: Impaired Automatic Speech: Social Response;Counting;Day of week;Month of year;Singing Level of Generative/Spontaneous Verbalization: Phrase Naming: Impairment Responsive: 0-25% accurate Confrontation: Impaired Verbal Errors: Phonemic paraphasias;Semantic paraphasias;Neologisms Effective Techniques: Phonemic cues;Semantic cues;Sentence completion Written Expression Dominant Hand: Right Written Expression: Exceptions to [facility] Oral Motor Oral Motor/Sensory Function Overall Oral Motor/Sensory Function: Moderate impairment Facial ROM: Reduced right;Suspected CN VII (facial) dysfunction Facial Symmetry: Abnormal symmetry right;Suspected CN VII (facial) dysfunction Facial Strength: Reduced right Lingual ROM:  (imprecise lateralization) Lingual Strength: Reduced Motor Speech Overall Motor Speech: Appears within functional limits for tasks assessed  Care Tool Care Tool Cognition Expression of Ideas and Wants Expression of Ideas  and Wants: Rarely/Never expressess or very difficult - rarely/never expresses self or speech is very difficult to understand   Understanding Verbal and Non-Verbal Content Understanding Verbal and Non-Verbal Content: Sometimes understands - understands only basic conversations or simple, direct phrases. Frequently requires cues to understand   Memory/Recall Ability *first 3 days only Memory/Recall Ability *first 3 days only: None of the above were recalled    Bedside Swallowing Assessment General Date of Onset: 07/31/20 Previous Swallow Assessment: 7/9 with f/u on 7/11 Diet Prior to this Study: Regular;Thin liquids Temperature Spikes Noted: No Respiratory Status: Room air History of Recent Intubation: No Behavior/Cognition: Alert;Cooperative;Distractible;Requires cueing Oral Cavity - Dentition: Adequate natural dentition Self-Feeding Abilities: Total assist Patient Positioning: Upright in bed Volitional Cough: Weak Volitional Swallow: Unable to elicit  Oral Care Assessment Does patient have any of the following "high(er) risk" factors?: None of the above Does patient have any of the following "at risk" factors?: None of the above Patient is LOW RISK: Follow universal precautions (see row information) Ice Chips Ice chips: Not tested Thin Liquid Thin Liquid: Within functional limits Presentation: Cup Nectar Thick Nectar Thick Liquid: Not tested Honey Thick Honey Thick Liquid: Not tested Puree Puree: Within functional limits Presentation: Spoon Solid Solid: Impaired Oral Phase Impairments: Impaired mastication Oral Phase Functional Implications: Prolonged oral transit;Impaired mastication;Oral residue Pharyngeal Phase Impairments: Suspected delayed Swallow;Throat Clearing - Delayed BSE Assessment Risk for Aspiration Impact on safety and function: Moderate aspiration risk;Risk for inadequate nutrition/hydration Other Related Risk Factors: Cognitive impairment  Short Term  Goals: Week 1: SLP Short Term Goal 1 (Week 1): Patient will tolerate Dys 1 diet and thin liquids without overt s/sx of aspiration with mod A verbal cues for use of swallowing compensatory strategies. SLP Short Term Goal 2 (Week 1): Patient will express wants/needs via multimodal communication w/ max A verbal/visual cues SLP Short Term Goal 3 (Week 1): Patient will name familiar objects with 50% accuracy with max A multimodal cues SLP Short Term Goal 4 (Week 1): Patient will answer basic yes/no questions utilizing multimodal communication with 50% accuracy with max A multimodal cues SLP Short Term Goal 5 (Week 1): Patient will match object to word from a field of two-three with max A verbal/visual cues. SLP Short Term Goal 6 (Week 1): Patient will demonstrate sustained attention to functional cognitive-linguistic tasks for 10 minutes with mod A verbal/visual cues for redirection.  Refer to Care Plan for Long Term Goals  Recommendations for other services: None   Discharge Criteria: Patient will be discharged from SLP if  patient refuses treatment 3 consecutive times without medical reason, if treatment goals not met, if there is a change in medical status, if patient makes no progress towards goals or if patient is discharged from hospital.  The above assessment, treatment plan, treatment alternatives and goals were discussed and mutually agreed upon: by patient and by family  Patty Sermons 08/06/2020, 6:38 PM

## 2020-08-06 NOTE — Progress Notes (Signed)
I was asked to assess Ms. Leland since her return from CT. Marissa Nestle, PA instructed the nursing staff to contact me for an assessment since she was not able to see patient.  Patient has bruising to the right side of her face. Patient is easily arousable,  eyes open, good eye contact, slow to respond but follows simple commands such as sticking out her tongue and squeezing my fingers with her left hand. Currently not speaking.  No gaze preference, tracking bilaterally, blinking to visual threat bilaterally.  Mild right facial droop. Tongue midline.  Left arm 4/5, right arm 2 /5, right lower extremity 2/5  Left lower extremity 3/5.  Sensation symmetrical bilaterally.  Staff stated an MRI will be ordered for more follow up.

## 2020-08-06 NOTE — Progress Notes (Signed)
Patient returned from radiology with RN escort.

## 2020-08-06 NOTE — Progress Notes (Addendum)
Spoke with on-call Delle Reining MD and informed her that per radiology, CT scan revealed a R occipital infarc. RN provided MD with # for radiology to order MRI.

## 2020-08-06 NOTE — Progress Notes (Signed)
2000 medication given; see MAR

## 2020-08-06 NOTE — Progress Notes (Signed)
PROGRESS NOTE   Subjective/Complaints: Patient unable to express complaints this morning- she is fatigued and flat. Niece is at bedside- she would like to be notified if there are any changes. Left hearing aide lost overnight.   ROS: unable to obtain due to patient's mental status.   Objective:   CT HEAD WO CONTRAST  Result Date: 08/06/2020 CLINICAL DATA:  87 year old female with altered mental status. EXAM: CT HEAD WITHOUT CONTRAST TECHNIQUE: Contiguous axial images were obtained from the base of the skull through the vertex without intravenous contrast. COMPARISON:  Head CT dated 08/01/2020. FINDINGS: Brain: Ill-defined area of hypodensity with loss of gray-white matter discrimination involving the right occipital lobe new since the prior CT most consistent with an infarct involving the right PCA territory, likely acute or subacute. Clinical correlation and further evaluation with MRI is recommended. Left ACA territory infarct as seen on the prior CT. There is mild age-related atrophy and chronic microvascular ischemic changes. There is no acute intracranial hemorrhage. No mass effect or midline shift no extra-axial fluid collection. Vascular: No hyperdense vessel or unexpected calcification. Skull: Normal. Negative for fracture or focal lesion. Sinuses/Orbits: Mildly displaced fractures of the anterior and posterolateral walls of the right maxillary sinus with right maxillary hemosinus. There is also mildly depressed and angulated fracture of the right zygomatic arch. Correlation with history of recent fall or trauma recommended. Other: None IMPRESSION: 1. No acute intracranial hemorrhage. 2. Right PCA territory infarct, likely acute or subacute. Clinical correlation and further evaluation with MRI is recommended. 3. Left ACA territory infarct as seen on the prior CT. 4. Mildly displaced fractures of the anterior and posterolateral walls of the  right maxillary sinus and right zygomatic arch. Correlation with history of recent fall or trauma recommended. These results were called by telephone at the time of interpretation on 08/06/2020 at 1:54 am to the patient's nurse, Peggye Pitt, who verbally acknowledged these results. Electronically Signed   By: Elgie Collard M.D.   On: 08/06/2020 01:55   MR BRAIN W WO CONTRAST  Result Date: 08/06/2020 CLINICAL DATA:  87 year old female with altered mental status. Recent left ACA territory infarct on MRI. Suspected right PCA infarct on head CT tonight. EXAM: MRI HEAD WITHOUT AND WITH CONTRAST TECHNIQUE: Multiplanar, multiecho pulse sequences of the brain and surrounding structures were obtained without and with intravenous contrast. CONTRAST:  92mL GADAVIST GADOBUTROL 1 MMOL/ML IV SOLN COMPARISON:  Head CT 0140 hours today.  Brain MRI 08/01/2020. FINDINGS: Brain: Confluent new restricted diffusion in the right PCA territory, confluent involvement of the medial right occipital lobe. Right mesial temporal lobe and central right thalamic involvement. Scattered small acute bilateral cerebellar infarcts also including bilateral PICA and right SCA territory involvement (series 5, image 68). Continued confluent left ACA territory restricted diffusion, size and extent not significantly changed from 08/01/2020. Small focus of restricted diffusion at the anterior temporal lobe unchanged. Cytotoxic edema in the affected areas, most pronounced in the ACA territory and associated with multifocal new petechial hemorrhage there (series 14, image 44). No acute hemorrhage elsewhere. No significant intracranial mass effect. No midline shift. Minimal post ischemic enhancement limited to the ACA territory. Stable ventricle size and configuration.  No extra-axial collection. Negative pituitary and cervicomedullary junction. No dural thickening. Stable chronic T2 heterogeneity in the bilateral cerebral white matter, deep gray nuclei and  brainstem. Vascular: Major intracranial vascular flow voids are stable, with dominant right vertebral artery. Major dural venous sinuses are enhancing following contrast and appear to be patent. Skull and upper cervical spine: Negative. Sinuses/Orbits: Stable orbits. Improved right maxillary sinus aeration. Other: Mastoids remain well aerated. IMPRESSION: 1. Confluent new Right PCA territory infarct, plus scattered small acute bilateral Cerebellar Infarcts. 2. Petechial hemorrhage has developed in the Left ACA territory infarct diagnosed on 08/01/2020. But there is no malignant hemorrhagic transformation or significant intracranial mass effect. 3. Underlying advanced small vessel disease. Electronically Signed   By: Odessa Fleming M.D.   On: 08/06/2020 04:22   Recent Labs    08/05/20 0338 08/06/20 0502  WBC 8.0 7.3  HGB 14.2 12.9  HCT 45.3 40.0  PLT 281 251   Recent Labs    08/05/20 0338 08/06/20 0502  NA 136 136  K 3.2* 4.3  CL 99 103  CO2 26 28  GLUCOSE 106* 114*  BUN 19 25*  CREATININE 0.93 0.90  CALCIUM 9.0 8.8*   No intake or output data in the 24 hours ending 08/06/20 0958      Physical Exam: Vital Signs Blood pressure (!) 169/89, pulse 83, temperature 98 F (36.7 C), resp. rate 20, SpO2 98 %.  Gen: no distress, normal appearing HEENT: oral mucosa pink and moist, NCAT Cardio: Reg rate Chest: normal effort, normal rate of breathing Abd: soft, non-distended Ext: no edema Psych: pleasant, normal affect Skin: intact Neuro: Lethargic. Opens eyes and make eye contact. Left sided neglect- not moving left sided extremities- not following commands consistently due to fatigue/cognition. Grips right hand firmly 4/5 strength. Able to state her name and her niece's name.   Assessment/Plan: 1. Functional deficits which require 3+ hours per day of interdisciplinary therapy in a comprehensive inpatient rehab setting. Physiatrist is providing close team supervision and 24 hour management of  active medical problems listed below. Physiatrist and rehab team continue to assess barriers to discharge/monitor patient progress toward functional and medical goals  Care Tool:  Bathing              Bathing assist       Upper Body Dressing/Undressing Upper body dressing   What is the patient wearing?: Hospital gown only    Upper body assist      Lower Body Dressing/Undressing Lower body dressing      What is the patient wearing?: Incontinence brief     Lower body assist       Toileting Toileting    Toileting assist       Transfers Chair/bed transfer  Transfers assist           Locomotion Ambulation   Ambulation assist              Walk 10 feet activity   Assist           Walk 50 feet activity   Assist           Walk 150 feet activity   Assist           Walk 10 feet on uneven surface  activity   Assist           Wheelchair     Assist               Wheelchair 50 feet with 2  turns activity    Assist            Wheelchair 150 feet activity     Assist          Medical Problem List and Plan: 1.  Right side weakness with mild receptive aphasia/dysarthria secondary to moderate-sized left MCA infarction             -patient may  shower             -ELOS/Goals: 2-3 wks MinA  -Initial CIR evals today  -please call niece [name] with any changes in status  -reviewed MRI results with [name]- shows new right PCA infarct- discussed with neurology- no change in management.   -palliative care consult placed 2.  Antithrombotics: -DVT/anticoagulation: Xarelto             -antiplatelet therapy: Plavix 75 mg daily 3. Pain Management: Tylenol as needed 4. Mood: Provide emotional support             -antipsychotic agents: N/A 5. Neuropsych: This patient is capable of making decisions on her own behalf. 6. Skin/Wound Care: Routine skin checks 7. Fluids/Electrolytes/Nutrition: Routine in and outs  with follow-up chemistries. Labs reviewed and stable.  8.  Chronic atrial fibrillation.  Continue Xarelto.  Follow-up cardiology services with daily EKGs 9.  Hypertension/atrial fibrillation.  Continue Cozaar 25 mg daily, Lopressor 37.5 mg twice daily.   10.  Chronic systolic congestive heart failure.  Lasix 20 mg daily.  Monitor for any signs of fluid overload 11.  CAD with history of CABG. follow-up cardiology services 12.  Hyperlipidemia.  Lipitor 13.  Multiple facial fractures.  Follow-up outpatient DR Elenore Rota.  Placed on empiric Keflex x7 days 14. Bowel and Bladder incont, likely cognitive/awareness, timed toileting- discussed with niece 52. Hard of hearing: please use masks with transparent piece over lips when possible- discussed with therapy and niece  >35 minutes spent in examination of patient, discussion with SLP regarding participation in session, reviewing labs/imaging/and notes and discussing with niece at bedside, discussion of palliative care and hospice, discussion of home support upon patient's discharge.    LOS: 1 days A FACE TO FACE EVALUATION WAS PERFORMED  Drema Pry Nasser Ku 08/06/2020, 9:58 AM

## 2020-08-06 NOTE — Discharge Instructions (Addendum)
Inpatient Rehab Discharge Instructions  Ellen Barajas Discharge date and time: No discharge date for patient encounter.   Activities/Precautions/ Functional Status: Activity: activity as tolerated Diet: dysphagia 2 thin liquids Wound Care: Routine skin checks Functional status:  ___ No restrictions     ___ Walk up steps independently ___ 24/7 supervision/assistance   ___ Walk up steps with assistance ___ Intermittent supervision/assistance  ___ Bathe/dress independently ___ Walk with walker     _x__ Bathe/dress with assistance ___ Walk Independently    ___ Shower independently ___ Walk with assistance    ___ Shower with assistance ___ No alcohol     ___ Return to work/school ________  Special Instructions: No driving smoking or alcohol   COMMUNITY REFERRALS UPON DISCHARGE:  [facility] FOLLOWING AT DISCHARGE EQUIPMENT DELIVERED TO HOME AND [facility]IE[facility] HAS HIRED 24/7 CAREGIVERS    STROKE/TIA DISCHARGE INSTRUCTIONS SMOKING Cigarette smoking nearly doubles your risk of having a stroke & is the single most alterable risk factor  If you smoke or have smoked in the last 12 months, you are advised to quit smoking for your health. Most of the excess cardiovascular risk related to smoking disappears within a year of stopping. Ask you doctor about anti-smoking medications [place] Quit Line: 1-800-QUIT NOW Free Smoking Cessation Classes [phone]  CHOLESTEROL Know your levels; limit fat & cholesterol in your diet  Lipid Panel     Component Value Date/Time   CHOL 199 08/02/2020 0246   CHOL 211 (H) 02/06/2018 1036   TRIG 79 08/02/2020 0246   HDL 53 08/02/2020 0246   HDL 63 02/06/2018 1036   CHOLHDL 3.8 08/02/2020 0246   VLDL 16 08/02/2020 0246   LDLCALC 130 (H) 08/02/2020 0246   LDLCALC 124 (H) 02/06/2018 1036     Many patients benefit from treatment even if their cholesterol is at goal. Goal: Total Cholesterol (CHOL) less than 160 Goal:  Triglycerides (TRIG) less than  150 Goal:  HDL greater than 40 Goal:  LDL (LDLCALC) less than 100   BLOOD PRESSURE American Stroke Association blood pressure target is less that 120/80 mm/Hg  Your discharge blood pressure is:  BP: (!) 160/84 Monitor your blood pressure Limit your salt and alcohol intake Many individuals will require more than one medication for high blood pressure  DIABETES (A1c is a blood sugar average for last 3 months) Goal HGBA1c is under 7% (HBGA1c is blood sugar average for last 3 months)  Diabetes: No known diagnosis of diabetes    Lab Results  Component Value Date   HGBA1C 6.0 (H) 08/02/2020    Your HGBA1c can be lowered with medications, healthy diet, and exercise. Check your blood sugar as directed by your physician Call your physician if you experience unexplained or low blood sugars.  PHYSICAL ACTIVITY/REHABILITATION Goal is 30 minutes at least 4 days per week  Activity: Increase activity slowly, Therapies: Physical Therapy: Home Health Return to work:  Activity decreases your risk of heart attack and stroke and makes your heart stronger.  It helps control your weight and blood pressure; helps you relax and can improve your mood. Participate in a regular exercise program. Talk with your doctor about the best form of exercise for you (dancing, walking, swimming, cycling).  DIET/WEIGHT Goal is to maintain a healthy weight  Your discharge diet is:  Diet Order             Diet regular Room service appropriate? No; Fluid consistency: Thin  Diet effective now  liquids Your height is:    Your current weight is:   Your Body Mass Index (BMI) is:    Following the type of diet specifically designed for you will help prevent another stroke. Your goal weight range is:   Your goal Body Mass Index (BMI) is 19-24. Healthy food habits can help reduce 3 risk factors for stroke:  High cholesterol, hypertension, and excess weight.  RESOURCES Stroke/Support Group:  Call  [phone]   STROKE EDUCATION PROVIDED/REVIEWED AND GIVEN TO PATIENT Stroke warning signs and symptoms How to activate emergency medical system (call 911). Medications prescribed at discharge. Need for follow-up after discharge. Personal risk factors for stroke. Pneumonia vaccine given: No Flu vaccine given: No My questions have been answered, the writing is legible, and I understand these instructions.  I will adhere to these goals & educational materials that have been provided to me after my discharge from the hospital.       My questions have been answered and I understand these instructions. I will adhere to these goals and the provided educational materials after my discharge from the hospital.  Patient/Caregiver Signature _______________________________ Date __________  Clinician Signature _______________________________________ Date __________  Please bring this form and your medication list with you to all your follow-up doctor's appointments.         Information on my medicine - XARELTO (Rivaroxaban)  Why was Xarelto prescribed for you? Xarelto was prescribed for you to reduce the risk of a blood clot forming that can cause a stroke if you have a medical condition called atrial fibrillation (a type of irregular heartbeat).  What do you need to know about xarelto ? Take your Xarelto ONCE DAILY at the same time every day with your evening meal. If you have difficulty swallowing the tablet whole, you may crush it and mix in applesauce just prior to taking your dose.  Take Xarelto exactly as prescribed by your doctor and DO NOT stop taking Xarelto without talking to the doctor who prescribed the medication.  Stopping without other stroke prevention medication to take the place of Xarelto may increase your risk of developing a clot that causes a stroke.  Refill your prescription before you run out.  After discharge, you should have regular check-up appointments  with your healthcare provider that is prescribing your Xarelto.  In the future your dose may need to be changed if your kidney function or weight changes by a significant amount.  What do you do if you miss a dose? If you are taking Xarelto ONCE DAILY and you miss a dose, take it as soon as you remember on the same day then continue your regularly scheduled once daily regimen the next day. Do not take two doses of Xarelto at the same time or on the same day.   Important Safety Information A possible side effect of Xarelto is bleeding. You should call your healthcare provider right away if you experience any of the following: Bleeding from an injury or your nose that does not stop. Unusual colored urine (red or dark brown) or unusual colored stools (red or black). Unusual bruising for unknown reasons. A serious fall or if you hit your head (even if there is no bleeding).  Some medicines may interact with Xarelto and might increase your risk of bleeding while on Xarelto. To help avoid this, consult your healthcare provider or pharmacist prior to using any new prescription or non-prescription medications, including herbals, vitamins, non-steroidal anti-inflammatory drugs (NSAIDs) and supplements.  This website  has more information on Xarelto: [email or web address].br.

## 2020-08-06 NOTE — Progress Notes (Signed)
Patient returned from radiology (MRI) with RN escort.

## 2020-08-06 NOTE — Progress Notes (Signed)
Charlett Blake, MD   Physician  Physical Medicine and Rehabilitation  PMR Pre-admission      Signed  Date of Service:  08/04/2020  9:03 AM       Related encounter: ED to Hosp-Admission (Discharged) from 07/31/2020 in [facility]       Signed          Show:Clear all '[x]' Written'[x]' Templated'[x]' Copied  Added by: '[x]' Cristina Gong, RN'[x]' Kirsteins, Luanna Salk, MD   '[]' Hover for details                                                                                                                                                                                                                                                                                                                                                                                                                                   PMR Admission Coordinator Pre-Admission Assessment   Patient: Ellen Barajas is an 87 y.o., female MRN: [id number] DOB: Feb 03, 1928 Height: '5\' 3"'  (160 cm) Weight: 68.4 kg Insurance Information HMO:     PPO:      PCP:      IPA:      80/20:      OTHER: PRIMARY: Blue Medicare      Policy#: [id number]      Subscriber: pt CM Name: Larene Beach      Phone#: [phone]     Fax#: [phone] Pre-Cert#: TBD   approved  for 7 days   Employer: Benefits:  Phone #: [phone]     Name: 7/12 Eff. Date: 01/2020     Deduct: none      Out of Pocket Max: $3900      Life Max: none CIR: $335 co pay per day days 1 until 6      SNF: $188 co pay per day days 100 days Outpatient: $40 per visit     Co-Pay: visits per medical neccesity Home Health: 100%     Co-Pay: visits per medical neccesity DME:  80% Co-Pay: 20% Providers: in network  SECONDARY: none   Financial Counselor:       Phone#:   The Therapist, art Information Summary" for patients in  Inpatient Rehabilitation Facilities with attached "Privacy Act [place] Records" was provided and verbally reviewed with: Patient and Family   Emergency Contact Information Contact Information       Name Relation Home Work Mobile    Chester Niece [phone]   [phone]    Baccam,Charles Brooke Bonito. Son     [phone]           Current Medical History  Patient Admitting Diagnosis: CVA   History of Present Illness:  87 year old right-handed female with history of atrial fibrillation on Xarelto, CAD with CABG 2002, postherpetic neuralgia, hypertension, hyperlipidemia as well as history of hip fracture 2020.  Presents 07/31/2020 after being found down with acute onset of right-sided weakness facial droop and aphasia.  She did have bruising to the right side of her face and eye.  Cranial CT scan negative for acute changes.  CT cervical spine as well as maxillofacial showed mildly displaced fractures of the right zygomatic arch, anterior and posterior lateral walls of the right maxillary sinus and possible diastasis of the right frontal zygomatic suture and conservative care was recommended and placed on empiric Keflex per ENT DrJuengel.  No other facial fractures.  Overlying right facial soft tissue swelling without orbital hematoma or foreign body.  CT cervical spine negative.  CT angiogram head and neck negative for large vessel occlusion.  There was a 2 mm A -comm aneurysm.  Patient did not receive TPA.  MRI identified moderate to large acute left ACA branch infarction.  Tiny acute infarct in the left anterior temporal cortex.  Echocardiogram with ejection fraction of 25 to 30%.  The left ventricle had severely decreased function.  Admission chemistries unremarkable except creatinine 1.05, troponin [id number], CK 731, lactic acid 1.9.  Cardiology service was consulted for elevated troponin.  EKG overall unchanged from previous tracings and no ischemic changes.  It was felt elevated  troponin in the setting of fall and being down for an extended amount of time.  Awaiting plan for possible need for 30-day cardiac event monitor.  Patient was cleared to continue chronic Xarelto and Plavix was added for history of CAD/non-STEMI.  No further work-up at this time was indicated.  Close monitoring of heart rate with history of atrial fibrillation and adjustments made in beta-blocker.  Patient is tolerating a regular consistency diet.   Complete NIHSS TOTAL: 12   Patient's medical record from [facility]  has been reviewed by the rehabilitation admission coordinator and physician.   Past Medical History      Past Medical History:  Diagnosis Date   AF (atrial fibrillation) ([place])      coumadin   Arthritis      "hands" (09/27/2017)   Coronary artery disease 2002    s/p CABG  Diverticulosis     Dyslipidemia     History of chicken pox     IHD (ischemic heart disease)     Myocardial infarction [facility]) ~ 2002   Post herpetic neuralgia 02/2012      Family History   family history includes CAD (age of onset: 62) in her father; CAD (age of onset: 55) in her mother.   Prior Rehab/Hospitalizations Has the patient had prior rehab or hospitalizations prior to admission? Yes   Has the patient had major surgery during 100 days prior to admission? No               Current Medications   Current Facility-Administered Medications:   acetaminophen (TYLENOL) tablet 650 mg, 650 mg, Oral, Q6H PRN, Jonetta Osgood, MD, 650 mg at 08/03/20 2120   albuterol (PROVENTIL) (2.5 MG/3ML) 0.083% nebulizer solution 2.5 mg, 2.5 mg, Nebulization, Q4H PRN, Ghimire, Henreitta Leber, MD   atorvastatin (LIPITOR) tablet 40 mg, 40 mg, Oral, Daily, Rosalin Hawking, MD, 40 mg at 08/05/20 0900   bisacodyl (DULCOLAX) suppository 10 mg, 10 mg, Rectal, Daily PRN, Ghimire, Henreitta Leber, MD   cephALEXin (KEFLEX) capsule 500 mg, 500 mg, Oral, Q8H, Orma Flaming, MD, 500 mg at 08/05/20 2025   clopidogrel (PLAVIX) tablet 75  mg, 75 mg, Oral, Daily, Chandrasekhar, Mahesh A, MD, 75 mg at 08/05/20 0900   furosemide (LASIX) tablet 20 mg, 20 mg, Oral, Daily, Chandrasekhar, Mahesh A, MD, 20 mg at 08/05/20 0900   losartan (COZAAR) tablet 25 mg, 25 mg, Oral, Daily, Chandrasekhar, Mahesh A, MD, 25 mg at 08/05/20 0900   metoprolol tartrate (LOPRESSOR) tablet 37.5 mg, 37.5 mg, Oral, BID, Kathyrn Drown D, NP   ondansetron (ZOFRAN) injection 4 mg, 4 mg, Intravenous, Q6H PRN, Ghimire, Henreitta Leber, MD   polyethylene glycol (MIRALAX / GLYCOLAX) packet 17 g, 17 g, Oral, Daily PRN, Ghimire, Henreitta Leber, MD   potassium chloride SA (KLOR-CON) CR tablet 40 mEq, 40 mEq, Oral, BID, Sheikh, Omair Latif, DO, 40 mEq at 08/05/20 0900   Rivaroxaban (XARELTO) tablet 15 mg, 15 mg, Oral, Q supper, Skeet Simmer, RPH, 15 mg at 08/04/20 1724   Patients Current Diet:  Diet Order                  Diet regular Room service appropriate? No; Fluid consistency: Thin  Diet effective now                         Precautions / Restrictions Precautions Precautions: Fall Precaution Comments: expressive aphasia, very hard of hearing Restrictions Weight Bearing Restrictions: No    Has the patient had 2 or more falls or a fall with injury in the past year? Yes   Prior Activity Level Limited Community (1-2x/wk): Mod I with quad cane in the home; did not drive   Prior Functional Level Self Care: Did the patient need help bathing, dressing, using the toilet or eating? Independent   Indoor Mobility: Did the patient need assistance with walking from room to room (with or without device)? Independent   Stairs: Did the patient need assistance with internal or external stairs (with or without device)? Independent   Functional Cognition: Did the patient need help planning regular tasks such as shopping or remembering to take medications? Independent   Home Assistive Devices / Equipment   Prior Device Use: Indicate devices/aids used by the patient  prior to current illness, exacerbation or injury?  Quad cane in the home  Current Functional Level Cognition   Arousal/Alertness: Awake/alert Overall Cognitive Status: Impaired/Different from baseline Difficult to assess due to: Impaired communication Orientation Level: Oriented to person, Disoriented to place, Disoriented to time, Disoriented to situation Following Commands: Follows one step commands consistently, Follows one step commands with increased time Safety/Judgement: Decreased awareness of safety, Decreased awareness of deficits General Comments: Frequently attempts to get up during session before therapist was "Ready". Does not seem to understand that significant assistance is required on her R side and she needs assistance to get up right now. Frustrated with aphasia but does better with choices.    Extremity Assessment (includes Sensation/Coordination)   Upper Extremity Assessment: RUE deficits/detail RUE Deficits / Details: Able to grasp therpaist's hand (weakly) and lower arm slowly from 90* shoulder flexion. RUE Sensation: WNL RUE Coordination: decreased fine motor, decreased gross motor  Lower Extremity Assessment: Defer to PT evaluation RLE Deficits / Details: Difficulty activate LE on command but functionally 2/5 RLE Sensation: decreased proprioception RLE Coordination: decreased fine motor, decreased gross motor     ADLs   Overall ADL's : Needs assistance/impaired Grooming: Wash/dry hands, Moderate assistance, Sitting Upper Body Dressing : Moderate assistance, Sitting Lower Body Dressing: Maximal assistance, Sitting/lateral leans Functional mobility during ADLs: Maximal assistance, Moderate assistance, Rolling walker, Cueing for safety, Cueing for sequencing General ADL Comments: Focused session on sitting balance and RUE exercises.     Mobility   Overal bed mobility: Needs Assistance Bed Mobility: Rolling, Sidelying to Sit Rolling: Min assist Sidelying to  sit: Min assist Sit to supine: Min assist Sit to sidelying: Mod assist General bed mobility comments: VC's for sequencing and attending to RUE. Assist for RUE management and trunk elevation to full sitting position.     Transfers   Overall transfer level: Needs assistance Equipment used: 2 person hand held assist, 1 person hand held assist (+ rail in hall) Transfers: Sit to/from Stand, Risk manager Sit to Stand: Min assist, +2 safety/equipment, Mod assist Stand pivot transfers: Min assist, +2 safety/equipment, Mod assist, +2 physical assistance General transfer comment: VC's for improved posture and sequencing. Therapist facilitating weight shift to be able to advance RLE for SPT.     Ambulation / Gait / Stairs / Wheelchair Mobility   Ambulation/Gait Ambulation/Gait assistance: Mod assist, +2 physical assistance, +2 safety/equipment Gait Distance (Feet): 15 Feet Assistive device: 1 person hand held assist (+ railing in hallway) Gait Pattern/deviations: Step-through pattern, Decreased stride length, Trunk flexed General Gait Details: With railing on the L and therapist support on the R, ambulation was much more successful vs 2 person HHA. Pt was able to demonstrate a step-through pattern with more appropriate weight shifting (less on the R than L however still improved) and advancement of RLE without assist. Gait velocity: Decreased Gait velocity interpretation: <1.31 ft/sec, indicative of household ambulator     Posture / Balance Dynamic Sitting Balance Sitting balance - Comments: Tendency for lateral lean to R; reaching to hold onto bedrail tightly with L hand Balance Overall balance assessment: Needs assistance Sitting-balance support: No upper extremity supported, Feet supported Sitting balance-Leahy Scale: Fair Sitting balance - Comments: Tendency for lateral lean to R; reaching to hold onto bedrail tightly with L hand Postural control: Right lateral lean Standing balance  support: Bilateral upper extremity supported, During functional activity Standing balance-Leahy Scale: Poor Standing balance comment: assist from OT/PT     Special needs/care consideration Easily frustrated due to aphasia    Previous Home Environment  Living Arrangements: Alone  Lives  With: Alone Available Help at Discharge: Family, Available 24 hours/day (niece to arrange 24/7 supervision at home) Type of Home: House Home Layout: One level Home Access: Stairs to enter Entrance Stairs-Rails: Right, Left Entrance Stairs-Number of Steps: 3 Bathroom Shower/Tub: Optometrist: Yes How Accessible: Accessible via walker Home Care Services: No Additional Comments: lives alone at baseline with family check-ins from niece   Discharge Living Setting Plans for Discharge Living Setting: Patient's home, Alone (niece to arrange 24/7 assist at discharge) Type of Home at Discharge: House Discharge Home Layout: One level Discharge Home Access: Stairs to enter Entrance Stairs-Rails: Right, Left Entrance Stairs-Number of Steps: 3 Discharge Bathroom Shower/Tub: Tub/shower unit Discharge Bathroom Toilet: Standard Discharge Bathroom Accessibility: Yes How Accessible: Accessible via walker Does the patient have any problems obtaining your medications?: No   Social/Family/Support Systems Contact Information: [name], niece and POA; 2 sons are not involved in her care Anticipated Caregiver: Niece and caregivers hired by Niece Ability/Limitations of Caregiver: Niece works but is very involved in her care and needs Caregiver Availability: 24/7 Discharge Plan Discussed with Primary Caregiver: Yes Is Caregiver In Agreement with Plan?: Yes Does Caregiver/Family have Issues with Lodging/Transportation while Pt is in Rehab?: No Niece is POA   Goals Patient/Family Goal for Rehab: supervision to min with PT, OT and SLP Expected length of stay: ELOS 2 to 3  weeks Pt/Family Agrees to Admission and willing to participate: Yes Program Orientation Provided & Reviewed with Pt/Caregiver Including Roles  & Responsibilities: Yes   Decrease burden of Care through IP rehab admission: n/a   Possible need for SNF placement upon discharge: not anticipated   Patient Condition: I have reviewed medical records from [facility] , spoken with CM, and patient and family member. I met with patient at the bedside for inpatient rehabilitation assessment.  Patient will benefit from ongoing PT, OT, and SLP, can actively participate in 3 hours of therapy a day 5 days of the week, and can make measurable gains during the admission.  Patient will also benefit from the coordinated team approach during an Inpatient Acute Rehabilitation admission.  The patient will receive intensive therapy as well as Rehabilitation physician, nursing, social worker, and care management interventions.  Due to bladder management, bowel management, safety, skin/wound care, disease management, medication administration, pain management, and patient education the patient requires 24 hour a day rehabilitation nursing.  The patient is currently min to mod assist overall with mobility and basic ADLs.  Discharge setting and therapy post discharge at home with home health is anticipated.  Patient has agreed to participate in the Acute Inpatient Rehabilitation Program and will admit today.   Preadmission Screen Completed By:  Cleatrice Burke, 08/05/2020 1:48 PM ______________________________________________________________________   Discussed status with Dr. Letta Pate on  08/05/2020 at  1335 and received approval for admission today.   Admission Coordinator:  Cleatrice Burke, RN, time  2952 Date 08/05/2020    Assessment/Plan: Diagnosis:Left ACA infarct Does the need for close, 24 hr/day Medical supervision in concert with the patient's rehab needs make it unreasonable for this patient  to be served in a less intensive setting? Yes Co-Morbidities requiring supervision/potential complications: Afib on anticoagulation, CAD, s/p NSTEMI, HTN Due to bladder management, bowel management, safety, skin/wound care, disease management, medication administration, pain management, and patient education, does the patient require 24 hr/day rehab nursing? Yes Does the patient require coordinated care of a physician, rehab nurse, PT, OT, and SLP to address physical  and functional deficits in the context of the above medical diagnosis(es)? Yes Addressing deficits in the following areas: balance, endurance, locomotion, strength, transferring, bowel/bladder control, bathing, dressing, feeding, grooming, toileting, cognition, and psychosocial support Can the patient actively participate in an intensive therapy program of at least 3 hrs of therapy 5 days a week? Yes The potential for patient to make measurable gains while on inpatient rehab is good Anticipated functional outcomes upon discharge from inpatient rehab: supervision and min assist PT, supervision and min assist OT, min assist SLP Estimated rehab length of stay to reach the above functional goals is: 2-3 wks Anticipated discharge destination: Home 10. Overall Rehab/Functional Prognosis: good     MD Signature: Charlett Blake M.D. [facility] Fellow Am Acad of Phys Med and [place] Board of Electrodiagnostic Med Fellow Am Board of Interventional Pain            Revision History                                  Note Details  Author Charlett Blake, MD File Time 08/05/2020  2:42 PM  Author Type Physician Status Signed  Last Editor Charlett Blake, MD Service Physical Medicine and [place] # [id number] Admit Date 08/05/2020

## 2020-08-06 NOTE — Evaluation (Signed)
Occupational Therapy Assessment and Plan  Patient Details  Name: Ellen Barajas MRN: [id number] Date of Birth: July 03, 1928  OT Diagnosis: abnormal posture, apraxia, cognitive deficits, disturbance of vision, hemiplegia affecting dominant side, and muscle weakness (generalized) Rehab Potential: Rehab Potential (ACUTE ONLY): Good ELOS: 2-3 weeks   Today's Date: 08/06/2020 OT Individual Time: [phone] OT Individual Time Calculation (min): 60 min     Hospital Problem: Principal Problem:   Embolism of left anterior cerebral artery Active Problems:   Left middle cerebral artery stroke [facility])   Past Medical History:  Past Medical History:  Diagnosis Date   AF (atrial fibrillation) ([place])    coumadin   Arthritis    "hands" (09/27/2017)   Coronary artery disease 2002   s/p CABG   Diverticulosis    Dyslipidemia    History of chicken pox    IHD (ischemic heart disease)    Myocardial infarction ([place]) ~ 2002   Post herpetic neuralgia 02/2012   Past Surgical History:  Past Surgical History:  Procedure Laterality Date   BREAST LUMPECTOMY Left 1992   excision of fibrous tumor/notes 06/19/2000   CARDIAC CATHETERIZATION  06/08/2000   CATARACT EXTRACTION W/ INTRAOCULAR LENS  IMPLANT, BILATERAL Bilateral 2013   CORONARY ARTERY BYPASS GRAFT  2002   x 4   EYE SURGERY Bilateral 1989   FEMUR IM NAIL Left 09/28/2017   Procedure: INTRAMEDULLARY (IM) NAIL FEMORAL;  Surgeon: Renette Butters, MD;  Location: [place];  Service: Orthopedics;  Laterality: Left;   INTRAMEDULLARY (IM) NAIL INTERTROCHANTERIC Right 11/22/2018   Procedure: INTRAMEDULLARY (IM) NAIL INTERTROCHANTRIC;  Surgeon: Renette Butters, MD;  Location: [place];  Service: Orthopedics;  Laterality: Right;    Assessment & Plan Clinical Impression: Ellen Barajas is a 87 year old right-handed female with history of atrial fibrillation on Xarelto, CAD with CABG 2002, postherpetic neuralgia, hypertension, hyperlipidemia as well as history of hip  fracture 2020.  Per chart review patient lives alone.  Independent with assistive device.  She does not drive.  Niece and family assist as needed.  Presents 07/31/2020 after being found down with acute onset of right-sided weakness facial droop and aphasia.  She did have bruising to the right side of her face and eye.  Cranial CT scan negative for acute changes.  CT cervical spine as well as maxillofacial showed mildly displaced fractures of the right zygomatic arch, anterior and posterior lateral walls of the right maxillary sinus and possible diastasis of the right frontal zygomatic suture and conservative care was recommended and placed on empiric Keflex per ENT DrJuengel.  No other facial fractures.  Overlying right facial soft tissue swelling without orbital hematoma or foreign body.  CT cervical spine negative.  CT angiogram head and neck negative for large vessel occlusion.  There was a 2 mm A -comm aneurysm.  Patient did not receive tPA.  MRI identified moderate to large acute left ACA branch infarction.  Tiny acute infarct in the left anterior temporal cortex.  Echocardiogram with ejection fraction of 25 to 30%.  The left ventricle had severely decreased function.  Admission chemistries unremarkable except creatinine 1.05, troponin [id number], CK 731, lactic acid 1.9.  Cardiology service was consulted for elevated troponin.  EKG overall unchanged from previous tracings and no ischemic changes.  It was felt elevated troponin in the setting of fall and being down for an extended amount of time.  Awaiting plan for possible need for 30-day cardiac event monitor.  Patient was cleared to continue chronic Xarelto and  Plavix was added for history of CAD/non-STEMI.  No further work-up at this time was indicated.  Close monitoring of heart rate with history of atrial fibrillation and adjustments made in beta-blocker..  Patient transferred to CIR on 08/05/2020 .    Patient currently requires max with basic self-care  skills secondary to muscle weakness, decreased cardiorespiratoy endurance, impaired timing and sequencing, abnormal tone, unbalanced muscle activation, motor apraxia, decreased coordination, and decreased motor planning, decreased visual acuity, decreased visual perceptual skills, and decreased visual motor skills, decreased midline orientation, decreased attention to left, right side neglect, decreased motor planning, and ideational apraxia, decreased initiation, decreased attention, decreased awareness, decreased problem solving, decreased safety awareness, decreased memory, and delayed processing, and decreased sitting balance, decreased standing balance, decreased postural control, hemiplegia, and decreased balance strategies.  Prior to hospitalization, patient could complete ADLs and basic IADLs with modified independent .  Patient will benefit from skilled intervention to decrease level of assist with basic self-care skills prior to discharge home with care partner.  Anticipate patient will require 24 hour supervision and minimal physical assistance and follow up home health.  OT - End of Session Activity Tolerance: Tolerates 30+ min activity with multiple rests Endurance Deficit: Yes Endurance Deficit Description: Global weakness and deconditioning OT Assessment Rehab Potential (ACUTE ONLY): Good OT Patient demonstrates impairments in the following area(s): Balance;Perception;Safety;Cognition;Sensory;Edema;Skin Integrity;Endurance;Vision;Motor OT Basic ADL's Functional Problem(s): Eating;Grooming;Bathing;Dressing;Toileting OT Transfers Functional Problem(s): Toilet;Tub/Shower OT Additional Impairment(s): Fuctional Use of Upper Extremity OT Plan OT Intensity: Minimum of 1-2 x/day, 45 to 90 minutes OT Frequency: 5 out of 7 days OT Duration/Estimated Length of Stay: 2-3 weeks OT Treatment/Interventions: Balance/vestibular training;DME/adaptive equipment instruction;Patient/family  education;Therapeutic Activities;Cognitive remediation/compensation;Psychosocial support;Therapeutic Exercise;Functional mobility training;Self Care/advanced ADL retraining;UE/LE Strength taining/ROM;Discharge planning;Neuromuscular re-education;Skin care/wound managment;UE/LE Coordination activities;Disease mangement/prevention;Pain management;Splinting/orthotics;Visual/perceptual remediation/compensation;Functional electrical stimulation;Wheelchair propulsion/positioning OT Self Feeding Anticipated Outcome(s): supervision OT Basic Self-Care Anticipated Outcome(s): min assist OT Toileting Anticipated Outcome(s): min assist OT Bathroom Transfers Anticipated Outcome(s): CGA OT Recommendation Patient destination: Home Follow Up Recommendations: Home health OT;24 hour supervision/assistance Equipment Recommended: To be determined   OT Evaluation Precautions/Restrictions  Precautions Precautions: Fall Precaution Comments: expressive aphasia, very hard of hearing (lost left hearing aid); left inattention; right pusher; right sided weakness/apraxia Restrictions Weight Bearing Restrictions: No General Chart Reviewed: Yes Pain Pain Assessment Pain Scale: Faces Faces Pain Scale: No hurt Home Living/Prior Functioning Home Living Family/patient expects to be discharged to:: Private residence Living Arrangements: Alone Available Help at Discharge: Family, Available 24 hours/day, Personal care attendant, Other (Comment) (Will be hiring private care for 24/7 coverage) Type of Home: House Home Access: Stairs to enter Technical brewer of Steps: 3 Entrance Stairs-Rails: Left Home Layout: One level Bathroom Shower/Tub: Chiropodist: Standard Additional Comments: lives alone at baseline with family check-ins from niece  Lives With: Alone IADL History Homemaking Responsibilities: Yes Meal Prep Responsibility: Primary Cleaning Responsibility: Primary Bill Paying/Finance  Responsibility: No Shopping Responsibility: No Current License: No Occupation: Retired Prior Function Level of Independence: Independent with basic ADLs, Independent with transfers, Independent with homemaking with ambulation, Independent with gait  Able to Take Stairs?: Yes Driving: No Comments: uses quad cane for ambulation, does not drive. Neice brings grocery.  Patient continued to be independent for ADL completion, med management, meal prep and home management.  Niece assists with bill payment and community mobility. Vision Patient Visual Report: Other (comment) (difficult to assess due to expressive aphasia and delayed processing. No family present) Vision Assessment?: Vision impaired- to be further tested in functional context  Eye Alignment: Impaired (comment) Ocular Range of Motion: Impaired-to be further tested in functional context Alignment/Gaze Preference: Head turned;Gaze right Tracking/Visual Pursuits: Impaired - to be further tested in functional context;Other (comment) Saccades: Impaired - to be further tested in functional context;Other (comment) Visual Fields: Impaired-to be further tested in functional context;Other (comment) Perception  Perception: Impaired Inattention/Neglect: Does not attend to right side of body;Does not attend to left visual field Praxis Praxis: Impaired Praxis Impairment Details: Initiation;Motor planning;Perseveration;Ideation Cognition Overall Cognitive Status: Impaired/Different from baseline Arousal/Alertness: Awake/alert Orientation Level: Nonverbal/unable to assess Year: Other (Comment) (UTA due to aphasia/communication difficulties) Day of Week:  (UTA due to aphasia/communication difficulties) Memory:  (UTA due to aphasia/communication difficulties) Immediate Memory Recall:  (UTA due to aphasia/communication difficulties) Memory Recall Sock:  (UTA due to aphasia/communication difficulties) Memory Recall Blue:  (UTA due to  aphasia/communication difficulties) Memory Recall Bed:  (UTA due to aphasia/communication difficulties) Attention: Focused;Sustained Focused Attention: Impaired Focused Attention Impairment: Verbal basic;Functional basic Sustained Attention: Impaired Sustained Attention Impairment: Verbal basic;Functional basic Awareness: Impaired Awareness Impairment: Intellectual impairment;Emergent impairment Problem Solving: Impaired Problem Solving Impairment: Functional basic;Verbal basic Executive Function: Sequencing;Initiating Sequencing: Impaired Sequencing Impairment: Functional basic Initiating: Impaired Initiating Impairment: Verbal basic;Functional basic Safety/Judgment: Impaired Sensation Sensation Light Touch: Impaired by gross assessment Hot/Cold: Not tested Proprioception: Impaired by gross assessment Stereognosis: Not tested Additional Comments: Unable to accurately assess due to aphasia and cog deficits Coordination Gross Motor Movements are Fluid and Coordinated: No Fine Motor Movements are Fluid and Coordinated: No Coordination and Movement Description: Blocked movement patterns with possible extensor tone in RLE. Heel Shin Test: UTA due to weakness and aphasia Motor  Motor Motor: Hemiplegia;Abnormal tone;Abnormal postural alignment and control;Motor impersistence Motor - Skilled Clinical Observations: right sided  Trunk/Postural Assessment  Cervical Assessment Cervical Assessment: Within Functional Limits Thoracic Assessment Thoracic Assessment: Within Functional Limits Lumbar Assessment Lumbar Assessment: Exceptions to [facility] (post pelvic tilt) Postural Control Postural Control: Deficits on evaluation (delayed and inadequate)  Balance Balance Balance Assessed: Yes Static Sitting Balance Static Sitting - Balance Support: Feet supported;No upper extremity supported Static Sitting - Level of Assistance: 4: Min assist Dynamic Sitting Balance Dynamic Sitting - Balance  Support: Feet supported;No upper extremity supported;During functional activity Dynamic Sitting - Level of Assistance: 3: Mod assist Static Standing Balance Static Standing - Balance Support: Bilateral upper extremity supported Static Standing - Level of Assistance: 3: Mod assist Dynamic Standing Balance Dynamic Standing - Balance Support: Bilateral upper extremity supported Dynamic Standing - Level of Assistance: 2: Max assist Extremity/Trunk Assessment RUE Assessment Passive Range of Motion (PROM) Comments: WFL Active Range of Motion (AROM) Comments: Only active motion noted right shoulder extension and digital flexion General Strength Comments: Difficult to assess due to right sided neglect and apraxia RUE Body System: Neuro Brunstrum levels for arm and hand: Arm;Hand Brunstrum level for arm: Stage II Synergy is developing Brunstrum level for hand: Stage III Synergies performed voluntarily    Care Tool Care Tool Self Care Eating   Eating Assist Level: Maximal Assistance - Patient 25 - 49%    Oral Care    Oral Care Assist Level: Maximal assistance - Patient 25 - 49%    Bathing   Body parts bathed by patient: Chest;Abdomen;Right upper leg;Left upper leg;Face Body parts bathed by helper: Front perineal area   Assist Level: Maximal Assistance - Patient 24 - 49%    Upper Body Dressing(including orthotics)   What is the patient wearing?: Bra;Pull over shirt   Assist Level: Maximal Assistance - Patient  25 - 49%    Lower Body Dressing (excluding footwear)     Assist for lower body dressing: Total Assistance - Patient < 25%    Putting on/Taking off footwear   What is the patient wearing?: Non-skid slipper socks         Care Tool Toileting Toileting activity   Assist for toileting: Dependent - Patient 0%     Care Tool Bed Mobility Roll left and right activity   Roll left and right assist level: Maximal Assistance - Patient 25 - 49%    Sit to lying activity   Sit to  lying assist level: Maximal Assistance - Patient 25 - 49%    Lying to sitting edge of bed activity   Lying to sitting edge of bed assist level: Maximal Assistance - Patient 25 - 49%     Care Tool Transfers Sit to stand transfer   Sit to stand assist level: Moderate Assistance - Patient 50 - 74%    Chair/bed transfer   Chair/bed transfer assist level: Maximal Assistance - Patient 25 - 49%     Toilet transfer         Care Tool Cognition Expression of Ideas and Wants Expression of Ideas and Wants: Rarely/Never expressess or very difficult - rarely/never expresses self or speech is very difficult to understand   Understanding Verbal and Non-Verbal Content Understanding Verbal and Non-Verbal Content: Sometimes understands - understands only basic conversations or simple, direct phrases. Frequently requires cues to understand   Memory/Recall Ability *first 3 days only Memory/Recall Ability *first 3 days only: None of the above were recalled    Refer to Care Plan for Long Term Goals  SHORT TERM GOAL WEEK 1 OT Short Term Goal 1 (Week 1): Pt will complete UB dressing with mod assist using hemi techniques OT Short Term Goal 2 (Week 1): Pt will complete sit<>stand with min assist in preperation for LB dressing and bathing. OT Short Term Goal 3 (Week 1): Pt will scan to left visual field to retrieve items from sink with mod cues. OT Short Term Goal 4 (Week 1): Pt will complete toilet transfer with mod assist.  Recommendations for other services: None    Skilled Therapeutic Intervention ADL ADL Grooming: Maximal assistance Where Assessed-Grooming: Sitting at sink Upper Body Bathing: Maximal assistance;Maximal cueing Where Assessed-Upper Body Bathing: Sitting at sink Lower Body Bathing: Maximal assistance;Maximal cueing Where Assessed-Lower Body Bathing: Sitting at sink Upper Body Dressing: Maximal assistance;Maximal cueing Where Assessed-Upper Body Dressing: Sitting at sink Lower  Body Dressing: Dependent;Maximal cueing Where Assessed-Lower Body Dressing: Bed level Toileting: Dependent;Maximal cueing Where Assessed-Toileting: Bed level Mobility  Bed Mobility Bed Mobility: Rolling Right;Right Sidelying to Sit;Rolling Left Rolling Right: Total Assistance - Patient < 25% Rolling Left: Minimal Assistance - Patient > 75% Right Sidelying to Sit: Total Assistance - Patient < 25% Transfers Sit to Stand: Moderate Assistance - Patient 50-74% Stand to Sit: Moderate Assistance - Patient 50-74%   Discharge Criteria: Patient will be discharged from OT if patient refuses treatment 3 consecutive times without medical reason, if treatment goals not met, if there is a change in medical status, if patient makes no progress towards goals or if patient is discharged from hospital.  The above assessment, treatment plan, treatment alternatives and goals were discussed and mutually agreed upon: by patient and by family  Ezekiel Slocumb 08/06/2020, 3:05 PM

## 2020-08-06 NOTE — Progress Notes (Addendum)
PRN Hydralazine 5mg  not given, BP at 0101 is 160/92 which is outside of the order parameters.

## 2020-08-06 NOTE — Progress Notes (Signed)
Patient arousable, responding to questions. Stated her name and that she is tired. Responded to commands to move arms and legs. Answered yes or no questions appropriately.

## 2020-08-06 NOTE — Progress Notes (Signed)
Patient continues to respond to name and noxious stimuli. Does not complete neuro assessments.

## 2020-08-06 NOTE — Progress Notes (Signed)
Inpatient Rehabilitation Care Coordinator Assessment and Plan Patient Details  Name: RUBEE VEGA MRN: [id number] Date of Birth: 1928-11-21  Today's Date: 08/06/2020  Hospital Problems: Principal Problem:   Embolism of left anterior cerebral artery Active Problems:   Left middle cerebral artery stroke [facility])  Past Medical History:  Past Medical History:  Diagnosis Date   AF (atrial fibrillation) ([facility])    coumadin   Arthritis    "hands" (09/27/2017)   Coronary artery disease 05/19/00   s/p CABG   Diverticulosis    Dyslipidemia    History of chicken pox    IHD (ischemic heart disease)    Myocardial infarction ([facility]) ~ 05-19-2000   Post herpetic neuralgia 02/2012   Past Surgical History:  Past Surgical History:  Procedure Laterality Date   BREAST LUMPECTOMY Left 1992   excision of fibrous tumor/notes 06/19/2000   CARDIAC CATHETERIZATION  06/08/2000   CATARACT EXTRACTION W/ INTRAOCULAR LENS  IMPLANT, BILATERAL Bilateral 2011-05-20   CORONARY ARTERY BYPASS GRAFT  2002   x 4   EYE SURGERY Bilateral 1989   FEMUR IM NAIL Left 09/28/2017   Procedure: INTRAMEDULLARY (IM) NAIL FEMORAL;  Surgeon: Sheral Apley, MD;  Location[facility];  Service: Orthopedics;  Laterality: Left;   INTRAMEDULLARY (IM) NAIL INTERTROCHANTERIC Right 11/22/2018   Procedure: INTRAMEDULLARY (IM) NAIL INTERTROCHANTRIC;  Surgeon: Sheral Apley, MD;  Location: [facility] OR;  Service: Orthopedics;  Laterality: Right;   Social History:  reports that she has never smoked. She has never used smokeless tobacco. She reports that she does not drink alcohol and does not use drugs.  Family / Support Systems Marital Status: Widow/Widower Patient Roles: Parent, Other (Comment) (Aunt) Children: Charles-son [phone] Other Supports: [name]-niece-HCPOA [phone] Anticipated Caregiver: Niece and hired caregivers Ability/Limitations of Caregiver: Niece works but is very involved and is Dietitian for [employer] Caregiver  Availability: 24/7 Family Dynamics: Close with niece she has two son's one is not in contact with and the other is not reliable. She has neighbors and a few friends left  Social History Preferred language: English Religion: Non-Denominational Cultural Background: No issues Education: HS Read: Yes Write: Yes Employment Status: Retired Marine scientist Issues: No issues Guardian/Conservator: None-according to MD pt is capable but had an extension of her CVA last night so this may change. Will include her niece who is alos her HCPOA   Abuse/Neglect Abuse/Neglect Assessment Can Be Completed: Yes Physical Abuse: Denies Verbal Abuse: Denies Sexual Abuse: Denies Exploitation of patient/patient's resources: Denies Self-Neglect: Denies  Emotional Status Pt's affect, behavior and adjustment status: Pt is willing to do what she can to regain her independence. She has always been independent and takne care of herself and her late husband who dies in 20-May-2018. Recent Psychosocial Issues: other health issues Psychiatric History: No history deferrred depression screen due to exhausted from last night and work up for CVA extension Substance Abuse History: No issues  Patient / Family Perceptions, Expectations & Goals Pt/Family understanding of illness & functional limitations: Pt and niece can explain her stroke and deficits. Niece reports she talks with the MD and wants to be kept informed of pt's condition due was not called last night when had medical issue. Will talk with RN managment regarding this Premorbid pt/family roles/activities: Aunt, Mom, retiree, church member, neighbor , etc Anticipated changes in roles/activities/participation: resume Pt/family expectations/goals: Pt is slow to respond but does with additional time. She states: " Im tired from last night."  Niece states: " I will hire  assist and am aware she will need help at discharge, but want to know what is going on  too."  [employer]: None Premorbid Home Care/DME Agencies: Other (Comment) (wheelchair, 3 in1 nd rw from her past hip surgeries and husband) Transportation available at discharge: Niece pt no longer drives after second hip fracture  Discharge Planning Living Arrangements: Alone Support Systems: Children, Other relatives, Church/faith community Type of Residence: Private residence Insurance Resources: Media planner (specify) Theatre manager Medicare) Financial Resources: Social Security Financial Screen Referred: No Living Expenses: Own Money Management: Patient, Family Does the patient have any problems obtaining your medications?: No Home Management: Pt and niece helps with some-grocery shopping and paying bills Patient/Family Preliminary Plans: Return home with hired assist, niece is already working on this. Pt wants to do for herself and will work hard while here. Niece aware of team conference Tuesday and will update then. Care Coordinator Barriers to Discharge: Decreased caregiver support Care Coordinator Barriers to Discharge Comments: Niece works but is Hydrologist Anticipated Follow Up Needs: HH/OP  Clinical Impression Exhausted female who has been up most of the night with tests to evaluate a new CVA or extension of her previous CVA. Her niece is here and was not contacted and somewhat upset regarding this. Will inform RN leadership of this and ask from now on they call her when change  in pt's condition. Being evaluated today and goals set. Niece working on Management consultant. Work on discharge needs.  Lucy Chris 08/06/2020, 10:51 AM

## 2020-08-06 NOTE — Progress Notes (Signed)
RN attempted neuro assessment at 0500; however, the patient will not follow simple commands to complete the assessment. Patient responds to name, noxious stimuli and exhibits visual tracking. Breathing is regular and unlabored.

## 2020-08-06 NOTE — Plan of Care (Signed)
  Problem: RH Swallowing Goal: LTG Patient will consume least restrictive diet using compensatory strategies with assistance (SLP) Description: LTG:  Patient will consume least restrictive diet using compensatory strategies with assistance (SLP) Flowsheets (Taken 08/06/2020 1833) LTG: Pt Patient will consume least restrictive diet using compensatory strategies with assistance of (SLP): Minimal Assistance - Patient > 75% Goal: LTG Patient will participate in dysphagia therapy to increase swallow function with assistance (SLP) Description: LTG:  Patient will participate in dysphagia therapy to increase swallow function with assistance (SLP) Flowsheets (Taken 08/06/2020 1833) LTG: Pt will participate in dysphagia therapy to increase swallow function with assistance of (SLP): Moderate Assistance - Patient 50 - 74% Goal: LTG Pt will demonstrate functional change in swallow as evidenced by bedside/clinical objective assessment (SLP) Description: LTG: Patient will demonstrate functional change in swallow as evidenced by bedside/clinical objective assessment (SLP) Flowsheets (Taken 08/06/2020 1833) LTG: Patient will demonstrate functional change in swallow as evidenced by bedside/clinical objective assessment: Oropharyngeal swallow   Problem: RH Comprehension Communication Goal: LTG Patient will comprehend basic/complex auditory (SLP) Description: LTG: Patient will comprehend basic/complex auditory information with cues (SLP). Flowsheets (Taken 08/06/2020 1833) LTG: Patient will comprehend: Basic auditory information LTG: Patient will comprehend auditory information with cueing (SLP): Moderate Assistance - Patient 50 - 74%   Problem: RH Expression Communication Goal: LTG Patient will express needs/wants via multi-modal(SLP) Description: LTG:  Patient will express needs/wants via multi-modal communication (gestures/written, etc) with cues (SLP) Flowsheets (Taken 08/06/2020 1833) LTG: Patient will express  needs/wants via multimodal communication (gestures/written, etc) with cueing (SLP): Moderate Assistance - Patient 50 - 74% Goal: LTG Patient will increase word finding of common (SLP) Description: LTG:  Patient will increase word finding of common objects/daily info/abstract thoughts with cues using compensatory strategies (SLP). Flowsheets (Taken 08/06/2020 1833) LTG: Patient will increase word finding of common (SLP): Moderate Assistance - Patient 50 - 74% Patient will use compensatory strategies to increase word finding of:  Common objects  Daily info   Problem: RH Attention Goal: LTG Patient will demonstrate this level of attention during functional activites (SLP) Description: LTG:  Patient will will demonstrate this level of attention during functional activites (SLP) Flowsheets (Taken 08/06/2020 1833) Patient will demonstrate during cognitive/linguistic activities the attention type of: Sustained Patient will demonstrate this level of attention during cognitive/linguistic activities in: Controlled LTG: Patient will demonstrate this level of attention during cognitive/linguistic activities with assistance of (SLP): Moderate Assistance - Patient 50 - 74% Number of minutes patient will demonstrate attention during cognitive/linguistic activities: 25

## 2020-08-06 NOTE — Progress Notes (Signed)
Received a call from nurse earlier about patient's unresponsiveness--a lot of confusion by nurses regariding patient's code status. Apparently she was evaluated by  David/Rapid RN and [facility] --whose concern was about her code status. Family was contacted and code status rectified. I was dealing with another patient who had extended her stroke and called back to check on patient. Her nurse informed me that patient still unresponsive and had not received any meds. BP labile but permissive HTN for 5-7 days per Dr. Warren Danes note therefore will not treat. She has hx of A fib as well as NSTEMI--will give 2 runs of K today. Recheck EKG as well as stat CT for follow up and to rule out bleed as cause of lethargy as back on Xarelto since yesterday. Marland Kitchen

## 2020-08-06 NOTE — Progress Notes (Signed)
RN unable to run IV potassium as patient not being monitored by telemetry. On call provide made aware.

## 2020-08-06 NOTE — Progress Notes (Signed)
Notified Marissa Nestle, PA regarding update on IV order from Toribio Harbour, ADON and DON, message left via VM

## 2020-08-07 DIAGNOSIS — Z66 Do not resuscitate: Secondary | ICD-10-CM

## 2020-08-07 MED ORDER — ESCITALOPRAM OXALATE 10 MG PO TABS
5.0000 mg | ORAL_TABLET | Freq: Every day | ORAL | Status: DC
  Administered 2020-08-07 – 2020-08-19 (×13): 5 mg via ORAL
  Filled 2020-08-07 (×13): qty 1

## 2020-08-07 MED ORDER — RIVAROXABAN 15 MG PO TABS
15.0000 mg | ORAL_TABLET | Freq: Once | ORAL | Status: AC
  Administered 2020-08-07: 15 mg via ORAL
  Filled 2020-08-07: qty 1

## 2020-08-07 MED ORDER — METOPROLOL TARTRATE 50 MG PO TABS
50.0000 mg | ORAL_TABLET | Freq: Two times a day (BID) | ORAL | Status: DC
  Administered 2020-08-07 – 2020-08-11 (×8): 50 mg via ORAL
  Filled 2020-08-07 (×8): qty 1

## 2020-08-07 MED ORDER — CEPHALEXIN 250 MG PO CAPS
500.0000 mg | ORAL_CAPSULE | ORAL | Status: AC
  Administered 2020-08-07: 500 mg via ORAL
  Filled 2020-08-07: qty 2

## 2020-08-07 NOTE — Progress Notes (Signed)
Daily Progress Note   Patient Name: Ellen Barajas       Date: 08/07/2020 DOB: 08-03-1928  Age: 87 y.o. MRN#: [id number] Attending Physician: Horton Chin, MD Primary Care Physician: No primary care provider on file. Admit Date: 08/05/2020  Reason for Consultation/Follow-up: goals of care  Subjective: At bedside, nurse tech reports patient 100% of her breakfast but refused to eat lunch. She was able to work with PT this morning, requiring mod-max assist for stand to pivot transfer.   During my visit, patient was completely non-verbal and with flat affect. However, I note that during morning PT sessions she was able to provide some yes/no responses.   I spoke with niece Ellen Barajas by phone. Expressed concern that patient has a flat affect and appears to be depressed. Ellen Barajas agrees that she noticed this too, and she feels that patient is may be unhappy and frustrated by her current functional state. Discussed that as patient was always so independent, it is likely difficult for her to be in this state of being dependent on others for care. Ellen Barajas shares that her aunt always said she "wanted to die in her sleep" and was always adamant regarding not having any interventions that would prolong her life. Ellen Barajas worries that her aunt is resentful because Ellen Barajas has somehow allowed her to be in her current state. Discussed that certain medications, such anticoagulation and antibiotics could be considered life-prolonging in certain situations. Discussed that there is the option to minimize any such medications under hospice care.   Discussed starting an SSRI to help with her mood and Ellen Barajas agrees.   Length of Stay: 2  Current Medications: Scheduled Meds:   atorvastatin  40 mg Oral Daily   cephALEXin  500 mg  Oral Q8H   clopidogrel  75 mg Oral Daily   furosemide  20 mg Oral Daily   losartan  25 mg Oral Daily   metoprolol tartrate  37.5 mg Oral BID   Rivaroxaban  15 mg Oral Q supper    Continuous Infusions:   PRN Meds: acetaminophen, albuterol, bisacodyl, hydrALAZINE, polyethylene glycol  Physical Exam Vitals reviewed.  Constitutional:      General: She is not in acute distress. Pulmonary:     Effort: Pulmonary effort is normal.  Neurological:     Mental Status: She is  alert.     Motor: Weakness present.  Psychiatric:        Mood and Affect: Affect is flat.        Speech: She is noncommunicative.            Vital Signs: BP 125/66 (BP Location: Left Arm)   Pulse 61   Temp 97.7 F (36.5 C)   Resp 16   SpO2 97%  SpO2: SpO2: 97 % O2 Device: O2 Device: Room Air O2 Flow Rate:    Intake/output summary:  Intake/Output Summary (Last 24 hours) at 08/07/2020 1252 Last data filed at 08/07/2020 0800 Gross per 24 hour  Intake 416 ml  Output --  Net 416 ml   LBM: Last BM Date: 08/07/20 Baseline Weight:   Most recent weight:         Palliative Assessment/Data: PPS 40%      Palliative Care Assessment & Plan   HPI/Patient Profile: 87 y.o. female  with past medical history of atrial fibrillation on Xarelto, CAD with CABG in 2002, hypertension, hyperlipidemia, and history of hip fracture who initially presented to the emergency department on 08/05/2020 with fall and altered mental status. She was admitted with NSTEMI Imaging also revealed multiple facial fractures, for which ENT recommended supportive care and anibiotics. Initial head CT was negative for acute findings, but MRI brain on 7/9 revealed a moderate to large acute left ACA infarct.  Neurology was consulted.  Cardiology was consulted and recommended a TTE which showed an EF of 25% to 30%.  She was started on diuresis and resumed on her anticoagulation in addition to Plavix.  She was deemed medically stable to be discharged to  [facility] on 7/13.    Due to mental status changes overnight 7/13-7/14, patient was evaluated for possible new infarct. MRI did reveal new right PCA territory infarct, plus scattered small acute bilateral Cerebellar infarcts.  Assessment: - acute CVA  - NSTEMI - newly diagnosed systolic heart failure - persistent atrial fibrillation - multiple facial fractures - right side weakness - aphasia  Recommendations/Plan: DNR/DNI as previously documented Continue current care and therapy to improve functional status If patient is stable, plan is for home with hospice upon discharge from CIR If patient declines, niece will likely transition to comfort care at that time with transfer to residential hospice Hospice referral made to [facility] Start escitalopram (Lexapro) 5 mg at bedtime for mood  Goals of Care and Additional Recommendations: Limitations on Scope of Treatment: No Artificial Feeding  Code Status: DNR   Prognosis:  < 6 months  Discharge Planning: Home with Hospice    Thank you for allowing the Palliative Medicine Team to assist in the care of this patient.   Total Time 35 minutes Prolonged Time Billed  no       Greater than 50%  of this time was spent counseling and coordinating care related to the above assessment and plan.  [name], NP  Please contact Palliative Medicine Team phone at [phone] for questions and concerns.

## 2020-08-07 NOTE — Progress Notes (Signed)
Physical Therapy Session Note  Patient Details  Name: Ellen Barajas MRN: [id number] Date of Birth: [birth year]  Today's Date: 08/07/2020 PT Individual Time: 0800-0900 PT Individual Time Calculation (min): 60 min   Short Term Goals: Week 1:  PT Short Term Goal 1 (Week 1): Pt will complete bed mobility with modA PT Short Term Goal 2 (Week 1): Pt will complete bed<>chair transfers wtih modA and LRAD PT Short Term Goal 3 (Week 1): Pt will ambulate 45f with modA and LRAD  Skilled Therapeutic Interventions/Progress Updates:    Pt greeted supine in bed to start session, MD at bedside performing morning rounds. Pt agreeable to PT tx with no reports of pain. She remains mostly nonverbal but was able to provide some "yes/no" responses but mostly shakes her head. She was able to respond her name and D.O.B, as well as the street she lives on. Her niece, CAngela Nevin entered the room and pt was able to state "CAngela Nevin for name to identify. She is unable to correctly choose "hospital" for location and is unaware of her situation. Continues to show R gaze preference, L inattention, and RLE extensor tone. Pt noted to be incontinent of both urine and bowel on arrival. Requested assist from NT to complete pericare and brief change. She is able to roll to the L with modA and requires totalA for rolling R. Don's sweatpants at bed level with totalA. Requires maxA for supine<>sit with HOB flat and use of bedrail and minA needed for initial sitting balance due to posterior bias. Requires maxA for removing night gown and maxA for donning a clean t-shirt. Initiation improves with simple/direct cues such as "put your shirt on." She completed sit<>stand with modA from bed level and requires mod/maxA for stand<>pivot transfer with RLE "sticking" to the ground and difficulty producing step's for sequencing. Once in w/c, requires totalA for repositioning. Wheeled sinkside where she washed her face with soaped washcloth with modA (somewhat  perseverative on localizing washing certain parts of her face) and required setupA for toothbrush (required cues and instruction for brushing the R side of her teeth). Required maxA for combing/brushing hair as she was unable reach the back or top of the head. RN then arriving for morning med's which pt required hand-over-hand assist for guiding spoon of applesauce to mouth. Completed stand<>pivot transfer with mod/maxA from w/c to EOB. Worked on static sitting balance, sit<>stands, and static standing with RW support - requires modA for sitting balance due to retropulsion, modA for sit<>stands, and modA for balance with RW support. Pt assisted back to bed with maxA and remained supine in bed with HOB raised to 52 deg in chair position. All needs met and bed alarm on.   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

## 2020-08-07 NOTE — Progress Notes (Signed)
Occupational Therapy Session Note  Patient Details  Name: Ellen Barajas MRN: [id number] Date of Birth: January 08, 1929  Today's Date: 08/07/2020 OT Individual Time: 4536-4680 OT Individual Time Calculation (min): 53 min    Short Term Goals: Week 1:  OT Short Term Goal 1 (Week 1): Pt will complete UB dressing with mod assist using hemi techniques OT Short Term Goal 2 (Week 1): Pt will complete sit<>stand with min assist in preperation for LB dressing and bathing. OT Short Term Goal 3 (Week 1): Pt will scan to left visual field to retrieve items from sink with mod cues. OT Short Term Goal 4 (Week 1): Pt will complete toilet transfer with mod assist.  Skilled Therapeutic Interventions/Progress Updates:    Pt received in room and consented to OT tx. Pt  requires increased multimodal cuing to follow simple commands, is inconsistent with following simple one step instructions. Attempted using communication board strategies, however pt just staring blankly at therapist. Pt req max A to transition from supine to sitting EOB and to scoot to EOB. total A to SPT from EOB to w/c.  Demo's R side neglect during grooming activity while seated sink side with HOH technique to brush R side of hair, wash R side of face. Pt was able to verbalize that she had no pain and has an easier time following commands for more functional tasks like "sit forward in your w/c" or "wash your face" and "brush your hair." Pt brought down to therapy gym and instructed to pick up towels on either side of her and cross midline and place on opposite side. Pt unable to follow commands, was able to pick up wash cloth from therapist after multimodal cuing. Attempted same activity with box and blocks, however pt just picked up block from therapist and held onto it. Pt unable to place on either side of her, therapist facilitated crossing midline to increase attention to both sides with HOH technique. Pt closing eyes during session, participation very  limited. Therapist brought pt back to room to decrease distractions, however pt still closing eyes and not participating in any further tasks. After tx, pt left up in w/c and left with seatbelt alarm on and call light in reach.   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

## 2020-08-07 NOTE — Progress Notes (Signed)
Occupational Therapy Session Note  Patient Details  Name: Ellen Barajas MRN: [id number] Date of Birth: [birth year]  Today's Date: 08/07/2020 OT Individual Time: 1400-1430 OT Individual Time Calculation (min): 30 min    Short Term Goals: Week 1:  OT Short Term Goal 1 (Week 1): Pt will complete UB dressing with mod assist using hemi techniques OT Short Term Goal 2 (Week 1): Pt will complete sit<>stand with min assist in preperation for LB dressing and bathing. OT Short Term Goal 3 (Week 1): Pt will scan to left visual field to retrieve items from sink with mod cues. OT Short Term Goal 4 (Week 1): Pt will complete toilet transfer with mod assist.  Skilled Therapeutic Interventions/Progress Updates:  Pt sitting up in w/c, flat affect, non responsive to yes/no simple questions.  Nurse made aware.  Pt reported yes with significantly increased time for processing when asked if she would like to go to bed.  Pt completed squat pivot w/c to EOB with mod assist.  Sit to supine with max assist.  Pt positioned with RUE supported with pillows. Call bell in reach, direct hand off to nurse.  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

## 2020-08-07 NOTE — Progress Notes (Signed)
On site provider was informed of EKG results. No new orders given

## 2020-08-07 NOTE — Progress Notes (Signed)
Speech Language Pathology Daily Session Note  Patient Details  Name: Ellen Barajas MRN: [id number] Date of Birth: Jan 20, 1929  Today's Date: 08/07/2020 SLP Individual Time: 1601-0932 SLP Individual Time Calculation (min): 45 min  Short Term Goals: Week 1: SLP Short Term Goal 1 (Week 1): Patient will tolerate Dys 1 diet and thin liquids without overt s/sx of aspiration with mod A verbal cues for use of swallowing compensatory strategies. SLP Short Term Goal 2 (Week 1): Patient will express wants/needs via multimodal communication w/ max A verbal/visual cues SLP Short Term Goal 3 (Week 1): Patient will name familiar objects with 50% accuracy with max A multimodal cues SLP Short Term Goal 4 (Week 1): Patient will answer basic yes/no questions utilizing multimodal communication with 50% accuracy with max A multimodal cues SLP Short Term Goal 5 (Week 1): Patient will match object to word from a field of two-three with max A verbal/visual cues. SLP Short Term Goal 6 (Week 1): Patient will demonstrate sustained attention to functional cognitive-linguistic tasks for 10 minutes with mod A verbal/visual cues for redirection.  Skilled Therapeutic Interventions: Patient agreeable to ST intervention with focus on communication and dysphagia. SLP wore clear mask on this date due to hard of hearing status and to maximize use of visual reinforcement 2/2 expressive/receptive language deficits. SLP initiated Western Aphasia Battery Bedside (WAB-Bedside) as continuation from yesterday's initial evaluation. See results below:  Spontaneous Speech Content: 0/10 Spontaneous Speech Fluency: 2/10 Auditory Verbal Comprehension Yes/No Questions: 5/10 Following sequential commands: 0/10 Verbal repetition: 0/10 (attempted with phonemic paraphasia x1) Object naming: 1/10 Reading: 0/10  Writing: not tested   Additional informal assessment:  Automatic Speech: Completed with mod-to-max A verbal and visual cues (cued  patient to SLP's lips with use of clear mask). Counting 1-10: unable during 1st trial 90% accuracy during 2nd trial with mod-to-max A verbal/visual cues. Days of the week: overall 57% accuracy across 2 trials with mod-to-max A verbal/visual cues and intoning. Months: overall 30% accuracy with max A verbal/visual cues and intoning across 2 trials. Phonemic paraphasias noted.   Object naming:  Named 2/10 items without cues. Named an additional 2 items with field of 2 diverse choices. Occasional semantic paraphasias noted. Patient would often not respond despite multiple prompts.   Verbal Reading:  Without prompt, patient read "speech therapy" on SLP's name tag. Able to read "stop" and verbalized "in" with the word "pain" likely secondary to L neglect. No response given with all other attempts.  Patient did not appear to attempt the following: pointing to objects within field of 2, and following 1 step commands. Patient exhibited decreased spontaneous communication as compared to yesterday's encounter. She exhibited significant difficulty attending to therapist even with intentional placement in right visual field. Required max verbal/visual/tactile cueing for meaningful participation. Overall presenting with severe-to-profound global aphasia.    Consumed Dys 1 textures and thin liquids with no overt s/sx of aspiration and suspected delay with swallow initiation per palpation. Exhibited adequate oral clearance with mild anterior labial spillage on right. Attempted hand-over-hand although patient was total A for self feeding.   Patient was left in her wheelchair at end of session with needs within reach at end of session. Continue per ST plan of care.  Pain Pain Assessment Pain Scale: Faces Faces Pain Scale: No hurt  Therapy/Group: Individual Therapy  Tamala Ser 08/07/2020, 1:42 PM

## 2020-08-07 NOTE — Progress Notes (Signed)
PROGRESS NOTE   Subjective/Complaints:  Found hearing aid- nursing working to keep it available- has in right now.  Denied pain with head nods.  Per staff, had LBM yesterday- bladder incontinent.     ROS: unable to obtain secondary to aphasia  Objective:   CT HEAD WO CONTRAST  Result Date: 08/06/2020 CLINICAL DATA:  87 year old female with altered mental status. EXAM: CT HEAD WITHOUT CONTRAST TECHNIQUE: Contiguous axial images were obtained from the base of the skull through the vertex without intravenous contrast. COMPARISON:  Head CT dated 08/01/2020. FINDINGS: Brain: Ill-defined area of hypodensity with loss of gray-white matter discrimination involving the right occipital lobe new since the prior CT most consistent with an infarct involving the right PCA territory, likely acute or subacute. Clinical correlation and further evaluation with MRI is recommended. Left ACA territory infarct as seen on the prior CT. There is mild age-related atrophy and chronic microvascular ischemic changes. There is no acute intracranial hemorrhage. No mass effect or midline shift no extra-axial fluid collection. Vascular: No hyperdense vessel or unexpected calcification. Skull: Normal. Negative for fracture or focal lesion. Sinuses/Orbits: Mildly displaced fractures of the anterior and posterolateral walls of the right maxillary sinus with right maxillary hemosinus. There is also mildly depressed and angulated fracture of the right zygomatic arch. Correlation with history of recent fall or trauma recommended. Other: None IMPRESSION: 1. No acute intracranial hemorrhage. 2. Right PCA territory infarct, likely acute or subacute. Clinical correlation and further evaluation with MRI is recommended. 3. Left ACA territory infarct as seen on the prior CT. 4. Mildly displaced fractures of the anterior and posterolateral walls of the right maxillary sinus and right  zygomatic arch. Correlation with history of recent fall or trauma recommended. These results were called by telephone at the time of interpretation on 08/06/2020 at 1:54 am to the patient's nurse, Peggye Pitt, who verbally acknowledged these results. Electronically Signed   By: Elgie Collard M.D.   On: 08/06/2020 01:55   MR BRAIN W WO CONTRAST  Result Date: 08/06/2020 CLINICAL DATA:  87 year old female with altered mental status. Recent left ACA territory infarct on MRI. Suspected right PCA infarct on head CT tonight. EXAM: MRI HEAD WITHOUT AND WITH CONTRAST TECHNIQUE: Multiplanar, multiecho pulse sequences of the brain and surrounding structures were obtained without and with intravenous contrast. CONTRAST:  58mL GADAVIST GADOBUTROL 1 MMOL/ML IV SOLN COMPARISON:  Head CT 0140 hours today.  Brain MRI 08/01/2020. FINDINGS: Brain: Confluent new restricted diffusion in the right PCA territory, confluent involvement of the medial right occipital lobe. Right mesial temporal lobe and central right thalamic involvement. Scattered small acute bilateral cerebellar infarcts also including bilateral PICA and right SCA territory involvement (series 5, image 68). Continued confluent left ACA territory restricted diffusion, size and extent not significantly changed from 08/01/2020. Small focus of restricted diffusion at the anterior temporal lobe unchanged. Cytotoxic edema in the affected areas, most pronounced in the ACA territory and associated with multifocal new petechial hemorrhage there (series 14, image 44). No acute hemorrhage elsewhere. No significant intracranial mass effect. No midline shift. Minimal post ischemic enhancement limited to the ACA territory. Stable ventricle size and configuration. No extra-axial collection. Negative pituitary  and cervicomedullary junction. No dural thickening. Stable chronic T2 heterogeneity in the bilateral cerebral white matter, deep gray nuclei and brainstem. Vascular: Major  intracranial vascular flow voids are stable, with dominant right vertebral artery. Major dural venous sinuses are enhancing following contrast and appear to be patent. Skull and upper cervical spine: Negative. Sinuses/Orbits: Stable orbits. Improved right maxillary sinus aeration. Other: Mastoids remain well aerated. IMPRESSION: 1. Confluent new Right PCA territory infarct, plus scattered small acute bilateral Cerebellar Infarcts. 2. Petechial hemorrhage has developed in the Left ACA territory infarct diagnosed on 08/01/2020. But there is no malignant hemorrhagic transformation or significant intracranial mass effect. 3. Underlying advanced small vessel disease. Electronically Signed   By: Odessa Fleming M.D.   On: 08/06/2020 04:22   Recent Labs    08/05/20 0338 08/06/20 0502  WBC 8.0 7.3  HGB 14.2 12.9  HCT 45.3 40.0  PLT 281 251   Recent Labs    08/05/20 0338 08/06/20 0502  NA 136 136  K 3.2* 4.3  CL 99 103  CO2 26 28  GLUCOSE 106* 114*  BUN 19 25*  CREATININE 0.93 0.90  CALCIUM 9.0 8.8*    Intake/Output Summary (Last 24 hours) at 08/07/2020 1929 Last data filed at 08/07/2020 1743 Gross per 24 hour  Intake 240 ml  Output --  Net 240 ml        Physical Exam: Vital Signs Blood pressure (!) 158/96, pulse 100, temperature 98.4 F (36.9 C), resp. rate 16, SpO2 (!) 86 %.   General: awake, alert, appropriate, sitting up in bed; nonverbal, but nodded head yes and no to simple questions; NT and PT in room; NAD HENT: oropharynx moist- R facial bruising notes- R gaze preference noted CV: borderline tachycardic; no JVD Pulmonary: CTA B/L; no W/R/R- good air movement- sounds great GI: soft, NT, ND, (+)BS Psychiatric: appropriate Neuro: aphasic- nodded yes and no to simple commands/questions- sounds like more awake this AM Neuro: Lethargic. Opens eyes and make eye contact. Left sided neglect- not moving left sided extremities- not following commands consistently due to fatigue/cognition.  Grips right hand firmly 4/5 strength. Able to state her name and her niece's name.   Assessment/Plan: 1. Functional deficits which require 3+ hours per day of interdisciplinary therapy in a comprehensive inpatient rehab setting. Physiatrist is providing close team supervision and 24 hour management of active medical problems listed below. Physiatrist and rehab team continue to assess barriers to discharge/monitor patient progress toward functional and medical goals  Care Tool:  Bathing    Body parts bathed by patient: Chest, Abdomen, Right upper leg, Left upper leg, Face   Body parts bathed by helper: Front perineal area     Bathing assist Assist Level: Maximal Assistance - Patient 24 - 49%     Upper Body Dressing/Undressing Upper body dressing   What is the patient wearing?: Bra, Pull over shirt    Upper body assist Assist Level: Maximal Assistance - Patient 25 - 49%    Lower Body Dressing/Undressing Lower body dressing      What is the patient wearing?: Incontinence brief, Pants     Lower body assist Assist for lower body dressing: Total Assistance - Patient < 25%     Toileting Toileting    Toileting assist Assist for toileting: Dependent - Patient 0%     Transfers Chair/bed transfer  Transfers assist     Chair/bed transfer assist level: Maximal Assistance - Patient 25 - 49%     Locomotion Ambulation  Ambulation assist      Assist level: Moderate Assistance - Patient 50 - 74% Assistive device: Parallel bars Max distance: 24ft   Walk 10 feet activity   Assist  Walk 10 feet activity did not occur: Safety/medical concerns        Walk 50 feet activity   Assist Walk 50 feet with 2 turns activity did not occur: Safety/medical concerns         Walk 150 feet activity   Assist Walk 150 feet activity did not occur: Safety/medical concerns         Walk 10 feet on uneven surface  activity   Assist Walk 10 feet on uneven surfaces  activity did not occur: Safety/medical concerns         Wheelchair     Assist Will patient use wheelchair at discharge?: No   Wheelchair activity did not occur: N/A         Wheelchair 50 feet with 2 turns activity    Assist    Wheelchair 50 feet with 2 turns activity did not occur: N/A       Wheelchair 150 feet activity     Assist  Wheelchair 150 feet activity did not occur: N/A       Medical Problem List and Plan: 1.  Right side weakness with mild receptive aphasia/dysarthria secondary to moderate-sized left MCA infarction             -patient may  shower             -ELOS/Goals: 2-3 wks MinA  -Initial CIR evals today  -please call niece [name] with any changes in status  -reviewed MRI results with [name]- shows new right PCA infarct- discussed with neurology- no change in management.   -palliative care consult placed  Con't PT, OT and SLP 2.  Antithrombotics: -DVT/anticoagulation: Xarelto             -antiplatelet therapy: Plavix 75 mg daily 3. Pain Management: Tylenol as needed 4. Mood: Provide emotional support             -antipsychotic agents: N/A 5. Neuropsych: This patient is capable of making decisions on her own behalf. 6. Skin/Wound Care: Routine skin checks 7. Fluids/Electrolytes/Nutrition: Routine in and outs with follow-up chemistries. Labs reviewed and stable.  8.  Chronic atrial fibrillation.  Continue Xarelto.  Follow-up cardiology services with daily EKGs  7/15- HR on EKG was 111- RVR- however down to 100- BP is somewhat elevated, so will increase Metoprolol to 50 mg BID  9.  Hypertension/atrial fibrillation.  Continue Cozaar 25 mg daily, Lopressor 37.5 mg twice daily.    7/15- have room to increase Metoprolol- willdo so, since Rate fast.  10.  Chronic systolic congestive heart failure.  Lasix 20 mg daily.  Monitor for any signs of fluid overload  7/15- will order daily weights 11.  CAD with history of CABG. follow-up cardiology  services 12.  Hyperlipidemia.  Lipitor 13.  Multiple facial fractures.  Follow-up outpatient DR Elenore Rota.  Placed on empiric Keflex x7 days 14. Bowel and Bladder incont, likely cognitive/awareness, timed toileting- discussed with niece  7/15- LBM yesterday- starting timed voiding for incontinence.  15. Hard of hearing: please use masks with transparent piece over lips when possible- discussed with therapy and niece  7/15- found hearing aids- con't to use esp with therapies.     LOS: 2 days A FACE TO FACE EVALUATION WAS PERFORMED  Jeb Schloemer 08/07/2020, 7:29 PM

## 2020-08-08 MED ORDER — AMANTADINE HCL 100 MG PO CAPS
100.0000 mg | ORAL_CAPSULE | Freq: Every day | ORAL | Status: DC
  Administered 2020-08-08 – 2020-08-20 (×13): 100 mg via ORAL
  Filled 2020-08-08 (×13): qty 1

## 2020-08-08 NOTE — IPOC Note (Signed)
Overall Plan of Care [facility]) Patient Details Name: Ellen Barajas MRN: [id number] DOB: 12-20-1928  Admitting Diagnosis: Embolism of left anterior cerebral artery  Hospital Problems: Principal Problem:   Embolism of left anterior cerebral artery Active Problems:   Left middle cerebral artery stroke [facility])     Functional Problem List: Nursing    PT Balance, Behavior, Endurance, Motor, Nutrition, Safety, Perception, Sensory, Skin Integrity  OT Balance, Perception, Safety, Cognition, Sensory, Edema, Skin Integrity, Endurance, Vision, Motor  SLP Cognition, Linguistic, Perception  TR         Basic ADL's: OT Eating, Grooming, Bathing, Dressing, Toileting     Advanced  ADL's: OT       Transfers: PT Bed Mobility, Bed to Chair, Customer service manager, Tub/Shower     Locomotion: PT Ambulation, Stairs     Additional Impairments: OT Fuctional Use of Upper Extremity  SLP Swallowing, Communication comprehension, expression Social Interaction, Attention, Awareness  TR      Anticipated Outcomes Item Anticipated Outcome  Self Feeding supervision  Swallowing  Min A   Basic self-care  min assist  Toileting  min assist   Bathroom Transfers CGA  Bowel/Bladder     Transfers  minA with LRAD  Locomotion  minA with LRAD  Communication  Mod A  Cognition  Mod A  Pain     Safety/Judgment      Therapy Plan: PT Intensity: Minimum of 1-2 x/day ,45 to 90 minutes PT Frequency: 5 out of 7 days PT Duration Estimated Length of Stay: 2-3 weeks OT Intensity: Minimum of 1-2 x/day, 45 to 90 minutes OT Frequency: 5 out of 7 days OT Duration/Estimated Length of Stay: 2-3 weeks SLP Intensity: Minumum of 1-2 x/day, 30 to 90 minutes SLP Frequency: 3 to 5 out of 7 days SLP Duration/Estimated Length of Stay: 2-3 weeks   Due to the current state of emergency, patients may not be receiving their 3-hours of Medicare-mandated therapy.   Team Interventions: Nursing Interventions    PT interventions  Ambulation/gait training, Discharge planning, Functional mobility training, Psychosocial support, Therapeutic Activities, Visual/perceptual remediation/compensation, Wheelchair propulsion/positioning, Therapeutic Exercise, Skin care/wound management, Neuromuscular re-education, Disease management/prevention, Warden/ranger, Cognitive remediation/compensation, DME/adaptive equipment instruction, Pain management, Splinting/orthotics, UE/LE Strength taining/ROM, UE/LE Coordination activities, Stair training, Patient/family education, Community reintegration, Development worker, international aid stimulation  OT Interventions Warden/ranger, Fish farm manager, Patient/family education, Therapeutic Activities, Cognitive remediation/compensation, Psychosocial support, Therapeutic Exercise, Functional mobility training, Self Care/advanced ADL retraining, UE/LE Strength taining/ROM, Discharge planning, Neuromuscular re-education, Skin care/wound managment, UE/LE Coordination activities, Disease mangement/prevention, Pain management, Splinting/orthotics, Visual/perceptual remediation/compensation, Functional electrical stimulation, Wheelchair propulsion/positioning  SLP Interventions Cognitive remediation/compensation, Environmental controls, Internal/external aids, Multimodal communication approach, Speech/Language facilitation, Cueing hierarchy, Dysphagia/aspiration precaution training, Functional tasks, Patient/family education, Therapeutic Activities  TR Interventions    SW/CM Interventions Discharge Planning, Psychosocial Support, Patient/Family Education   Barriers to Discharge MD  Medical stability, Incontinence, Neurogenic bowel and bladder, Lack of/limited family support, Medication compliance, Behavior, and Nutritional means  Nursing      PT Decreased caregiver support, Home environment access/layout, Lack of/limited family support, Insurance for SNF coverage, Weight,  Incontinence    OT      SLP Lack of/limited family support Patient's Niece reports she is the only family member present and able to support patient  SW Decreased caregiver support Niece works but is Programmer, multimedia Discharge Planning: Destination: PT-Home ,OT- Home , SLP-Home Projected Follow-up: PT-Home health PT, OT-  Home health OT, 24 hour supervision/assistance, SLP-24 hour supervision/assistance,  Home Health SLP Projected Equipment Needs: PT-To be determined, OT- To be determined, SLP-None recommended by SLP Equipment Details: PT- , OT-  Patient/family involved in discharge planning: PT- Patient,  OT-Patient, Family member/caregiver, SLP-Patient, Family member/caregiver  MD ELOS: 1-3 weeks- depending on niece/hospice? Medical Rehab Prognosis:  Fair Assessment: Pt is an 87 yr old female with L MCA infarct and aphasia  And new R PCA infarct- Afib, Tachycardia, HTN, sCHF, and multiple facial fx's from fall Goals- min-mod A  See Team Conference Notes for weekly updates to the plan of care

## 2020-08-08 NOTE — Progress Notes (Signed)
Physical Therapy Session Note  Patient Details  Name: Ellen Barajas MRN: [id number] Date of Birth: [birth year]  Today's Date: 08/08/2020 PT Individual Time: 1345-1415 PT Individual Time Calculation (min): 30 min   Short Term Goals: Week 1:  PT Short Term Goal 1 (Week 1): Pt will complete bed mobility with modA PT Short Term Goal 2 (Week 1): Pt will complete bed<>chair transfers wtih modA and LRAD PT Short Term Goal 3 (Week 1): Pt will ambulate 17ft with modA and LRAD Week 2:    Week 3:     Skilled Therapeutic Interventions/Progress Updates:   Pain:  Pt nonverbal throughout session.  No indications of pain.  Treatment to tolerance.  Rest breaks and repositioning as needed.  Pt initially oob in wc, does not respond to therapist verbally or otherwise other than to slowly scan visually to therapist.  Pt in wc, pt transported to gym for continued session.    Sit to stand from wc in parallel bars w/max tactile cues to initiate, scoots w/cues and mod assist, Sit to stand w/mod assist, extensort tone RLE.  Gait in parallel bars 3 steps, total assist to advance RLE due to extensor tone, max assist to facilitate wt shifting to L for anvancement RLE, ant trunk lean, mod cues to facilitate upright posture.  Gait 54ft as above but w/improved initiation of task.  Scooting in wc demonstrates poor motor planning, tends to sit w/hips forward in chair in reclined manner, max assist for sequencing and scooting hips posteriorly in wc.  Gait 78ft/turn/8ft w/mod assist, advances RLE without assist 20%steps, therapist advances RUE on bar, L gaze preference, pt progressively more responsive and following some simple commands such as "turn around".  Verbalized "tell me...." but unable to complete statement.   Pt in and out of sleep during session. Sit to stand w/min assist from wc. Gait HHA x 3 steps, pt incontinent of B&B,returned to wc/transported to room. Wc to bed stand pivot mod assist.  Sit to supine max  assist to initiate, min assist to complete.  NT in to assist w/care.  Rolls w/visual and tactile cues to initiate, min to max assist to complete, total assist for pericare, lower body bathing, and clothing/brief change.   Pt left supine w/rails up x 3, alarm set, bed in lowest position, and needs in reach.  PM SESSION Pt initially supine w/family at bedside.  Family education performed during session.  Pt rolls and lifts feet for donning pants w/max tactile cueing and gestural cues for initiation, heavy mod for rolling.  Sit to sit w/cues and mod assist.  Discussed issues w/initiation/motor planning w/family.   Son then educated on HHA guarding and assisted pt/therapist w/+2 gait total distance 1ft w/standing breaks due to "freezing" type halts in forward progression which occur approx every 61feet.  Occasional assist to progress RLE, mod assist to facilitae L wt shift w/swing phase R.  Pt more alert, scanning environment, distractions also may contribute to "freezing" of forward gait progression.   Niece followed w/wc, pt stand to sit in wc w/tactile cues, min assist.   Scoots in wc w/moderate multimodal cueing for sequencing esp for L hip.   Pt handed off to OT for next session    Therapy Documentation Precautions:  Precautions Precautions: Fall Precaution Comments: expressive aphasia, very hard of hearing (lost left hearing aid); left inattention; right pusher; right sided weakness/apraxia Restrictions Weight Bearing Restrictions: No    Therapy/Group: Individual Therapy Ellen Barajas, PT   Ellen Barajas  08/08/2020, 5:06 PM

## 2020-08-08 NOTE — Progress Notes (Signed)
Daily Progress Note   Patient Name: Ellen Barajas       Date: 08/08/2020 DOB: 05/16/1928  Age: 87 y.o. MRN#: [id number] Attending Physician: Izora Ribas, MD Primary Care Physician: No primary care provider on file. Admit Date: 08/05/2020  Reason for Consultation/Follow-up: goals of care  Subjective: I met with niece [name] and son [name] at bedside. Patient is currently out of the room in therapy. Family reports she was able to ambulate earlier x 2 assist.  We reviewed the disease trajectory of a sudden, severe neurological injury. Discussed that there will be ups and downs and it remains unclear what will be patient's new baseline.   [name] shares that patient has previously expressed to her multiple times that she was "ready to go" and she didn't want her life prolonged. Patient had specifically stated "don't interfere in God's will". Both [name] and [name] feel that patient would not be happy with her current situation, even though she is meeting her therapy goals. [name] worries that she is not honoring her wishes.  I provided reassurance that she was honoring her wishes by bringing her home with hospice at the appropriate time. [name] considers whether she should bring her home sooner. Discussed the pros and cons of continuing rehab past the initial 7 days. The main pro is to improve her functional status, so she is in the best position possible to enjoy her time at home.   Reviewed the benefits of hospice care. Discussed that it offers a holistic approach to care in the setting of end-stage illness/disease, and is about supporting the patient where they are while allowing the natural course to occur.   Patient returns to the room with OT. Her affect appears less flat compared to yesterday.  When [name] asks if she had fun at therapy, patient replies "yeah".   Length of Stay: 3  Current Medications: Scheduled Meds:   amantadine  100 mg Oral Daily   atorvastatin  40 mg Oral Daily   clopidogrel  75 mg Oral Daily   escitalopram  5 mg Oral QHS   furosemide  20 mg Oral Daily   losartan  25 mg Oral Daily   metoprolol tartrate  50 mg Oral BID   Rivaroxaban  15 mg Oral Q supper    PRN Meds: acetaminophen, albuterol, bisacodyl,  hydrALAZINE, polyethylene glycol        Vital Signs: BP (!) 111/91 (BP Location: Left Arm)   Pulse 69   Temp 97.7 F (36.5 C) (Oral)   Resp 16   Wt 65.6 kg   SpO2 97%   BMI 25.62 kg/m  SpO2: SpO2: 97 % O2 Device: O2 Device: Room Air O2 Flow Rate:         Palliative Assessment/Data: PPS 40%       Palliative Care Assessment & Plan   HPI/Patient Profile: 87 y.o. female  with past medical history of atrial fibrillation on Xarelto, CAD with CABG in 2002, hypertension, hyperlipidemia, and history of hip fracture who initially presented to the emergency department on 08/05/2020 with fall and altered mental status. She was admitted with NSTEMI Imaging also revealed multiple facial fractures, for which ENT recommended supportive care and anibiotics. Initial head CT was negative for acute findings, but MRI brain on 7/9 revealed a moderate to large acute left ACA infarct.  Neurology was consulted.  Cardiology was consulted and recommended a TTE which showed an EF of 25% to 30%.  She was started on diuresis and resumed on her anticoagulation in addition to Plavix.  She was deemed medically stable to be discharged to CIR on 7/13.    Due to mental status changes overnight 7/13-7/14, patient was evaluated for possible new infarct. MRI did reveal new right PCA territory infarct, plus scattered small acute bilateral Cerebellar infarcts.   Assessment: - acute CVA - NSTEMI - newly diagnosed systolic heart failure - persistent atrial fibrillation - multiple  facial fractures - right side weakness - aphasia  Recommendations/Plan: DNR/DNI as previously documented Continue current care and therapy to improve functional status Plan is for home with hospice ([facility]) upon discharge from CIR Continue escitalopram (Lexapro) 5 mg at bedtime for mood PMT will continue to follow   Goals of Care and Additional Recommendations: Limitations on Scope of Treatment: No Artificial Feeding  Code Status: DNR  Prognosis:  < 6 months  Discharge Planning: Home with Hospice   Thank you for allowing the Palliative Medicine Team to assist in the care of this patient.   Total Time 35 minutes Prolonged Time Billed  no       Greater than 50%  of this time was spent counseling and coordinating care related to the above assessment and plan.  Lavena Bullion, NP  Please contact Palliative Medicine Team phone at [phone] for questions and concerns.

## 2020-08-08 NOTE — Progress Notes (Signed)
PROGRESS NOTE   Subjective/Complaints:  Pt denies pain-  Very delayed processing- took almost to answer me Spoke some with palliative care.    ROS: unable to assess due to cognition/aphasia?  Objective:   No results found. Recent Labs    08/06/20 0502  WBC 7.3  HGB 12.9  HCT 40.0  PLT 251   Recent Labs    08/06/20 0502  NA 136  K 4.3  CL 103  CO2 28  GLUCOSE 114*  BUN 25*  CREATININE 0.90  CALCIUM 8.8*    Intake/Output Summary (Last 24 hours) at 08/08/2020 1855 Last data filed at 08/08/2020 1745 Gross per 24 hour  Intake 472 ml  Output --  Net 472 ml        Physical Exam: Vital Signs Blood pressure (!) 111/91, pulse 69, temperature 97.7 F (36.5 C), temperature source Oral, resp. rate 16, weight 65.6 kg, SpO2 97 %.    General: awake, alert, appropriate, but extremely delayed in speech/initiation- with OT;  NAD HENT: conjugate gaze; oropharynx moist CV: regular rate; no JVD Pulmonary: CTA B/L; no W/R/R- good air movement GI: soft, NT, ND, (+)BS Psychiatric: appropriate; flat, monotone; spoke a few words Neurological: aphasic?; more interactive, but not much  Neuro: aphasic- nodded yes and no to simple commands/questions- sounds like more awake this AM Neuro: Lethargic. Opens eyes and make eye contact. Left sided neglect- not moving left sided extremities- not following commands consistently due to fatigue/cognition. Grips right hand firmly 4/5 strength. Able to state her name and her niece's name.   Assessment/Plan: 1. Functional deficits which require 3+ hours per day of interdisciplinary therapy in a comprehensive inpatient rehab setting. Physiatrist is providing close team supervision and 24 hour management of active medical problems listed below. Physiatrist and rehab team continue to assess barriers to discharge/monitor patient progress toward functional and medical goals  Care  Tool:  Bathing    Body parts bathed by patient: Chest, Abdomen, Right upper leg, Left upper leg, Face   Body parts bathed by helper: Front perineal area     Bathing assist Assist Level: Maximal Assistance - Patient 24 - 49%     Upper Body Dressing/Undressing Upper body dressing   What is the patient wearing?: Bra, Pull over shirt    Upper body assist Assist Level: Maximal Assistance - Patient 25 - 49%    Lower Body Dressing/Undressing Lower body dressing      What is the patient wearing?: Pants     Lower body assist Assist for lower body dressing: Total Assistance - Patient < 25%     Toileting Toileting    Toileting assist Assist for toileting: Dependent - Patient 0%     Transfers Chair/bed transfer  Transfers assist     Chair/bed transfer assist level: Total Assistance - Patient < 25%     Locomotion Ambulation   Ambulation assist      Assist level: 2 helpers Assistive device: Hand held assist Max distance: 50   Walk 10 feet activity   Assist  Walk 10 feet activity did not occur: Safety/medical concerns  Assist level: 2 helpers Assistive device: Hand held assist   Walk 50  feet activity   Assist Walk 50 feet with 2 turns activity did not occur: Safety/medical concerns  Assist level: 2 helpers Assistive device: Hand held assist    Walk 150 feet activity   Assist Walk 150 feet activity did not occur: Safety/medical concerns         Walk 10 feet on uneven surface  activity   Assist Walk 10 feet on uneven surfaces activity did not occur: Safety/medical concerns         Wheelchair     Assist Will patient use wheelchair at discharge?: No   Wheelchair activity did not occur: N/A         Wheelchair 50 feet with 2 turns activity    Assist    Wheelchair 50 feet with 2 turns activity did not occur: N/A       Wheelchair 150 feet activity     Assist  Wheelchair 150 feet activity did not occur: N/A        Medical Problem List and Plan: 1.  Right side weakness with mild receptive aphasia/dysarthria secondary to moderate-sized left MCA infarction             -patient may  shower             -ELOS/Goals: 2-3 wks MinA  -Initial CIR evals today  -please call niece Ellen Barajas with any changes in status  -reviewed MRI results with Ellen Barajas- shows new right PCA infarct- discussed with neurology- no change in management.   -palliative care consult placed  Con't PT, OT and SLP 2.  Antithrombotics: -DVT/anticoagulation: Xarelto             -antiplatelet therapy: Plavix 75 mg daily 3. Pain Management: Tylenol as needed 4. Mood: Provide emotional support             -antipsychotic agents: N/A 5. Neuropsych: This patient is NOT capable of making decisions on her own behalf. 6. Skin/Wound Care: Routine skin checks 7. Fluids/Electrolytes/Nutrition: Routine in and outs with follow-up chemistries. Labs reviewed and stable.  8.  Chronic atrial fibrillation.  Continue Xarelto.  Follow-up cardiology services with daily EKGs  7/15- HR on EKG was 111- RVR- however down to 100- BP is somewhat elevated, so will increase Metoprolol to 50 mg BID  9.  Hypertension/atrial fibrillation.  Continue Cozaar 25 mg daily, Lopressor 37.5 mg twice daily.    7/15- have room to increase Metoprolol- willdo so, since Rate fast.   7/16- HR 69 this AM- much improved con't regimen 10.  Chronic systolic congestive heart failure.  Lasix 20 mg daily.  Monitor for any signs of fluid overload  7/15- will order daily weights  7/16- will monitor for trend 11.  CAD with history of CABG. follow-up cardiology services 12.  Hyperlipidemia.  Lipitor 13.  Multiple facial fractures.  Follow-up outpatient DR Elenore Rota.  Placed on empiric Keflex x7 days 14. Bowel and Bladder incont, likely cognitive/awareness, timed toileting- discussed with niece  7/15- LBM yesterday- starting timed voiding for incontinence.  15. Hard of hearing: please use masks with  transparent piece over lips when possible- discussed with therapy and niece  7/15- found hearing aids- con't to use esp with therapies.  16. Decreased initiation  7/16- started low dose Amantadine- for now.    LOS: 3 days A FACE TO FACE EVALUATION WAS PERFORMED  Ellen Barajas 08/08/2020, 6:55 PM

## 2020-08-08 NOTE — Progress Notes (Signed)
Occupational Therapy Session Note  Patient Details  Name: Ellen Barajas MRN: [id number] Date of Birth: 1928/10/02  Today's Date: 08/08/2020 OT Individual Time: 4742-5956 and 1415-1500 OT Individual Time Calculation (min): 56 min and 45 min   Short Term Goals: Week 1:  OT Short Term Goal 1 (Week 1): Pt will complete UB dressing with mod assist using hemi techniques OT Short Term Goal 2 (Week 1): Pt will complete sit<>stand with min assist in preperation for LB dressing and bathing. OT Short Term Goal 3 (Week 1): Pt will scan to left visual field to retrieve items from sink with mod cues. OT Short Term Goal 4 (Week 1): Pt will complete toilet transfer with mod assist.    Skilled Therapeutic Interventions/Progress Updates:    Session 1: Pt greeted at time of session supine in bed resting, no pain reported via simple questions or gestures. Note very limited verbal communication throughout as pt saying inconsistent "yes/no" and at times repeating back short phrases stated by therapist such as "wash your face." HOH but ensured hearing aides in place. Brief clean and dry, donned pants total/dependent bed level with pt initiating trying to help thread LLE. Supine > sit Max A and R lean noted and physical assist needed to correct. Decreasing to CGA/supervision sitting balance. Squat pivot/lateral scoot to L side to wheelchair total A, therapist placing L hand on surface, providing multimodal cues and extended time to process prior to transfer. Set up at sink for oral hygiene and face washing, Mod/Max overall for both tasks. Pt did have some R neglect to R side of face/mouth, min/mod cues to attend. Multimodal cues throughout session for decreasing lean and improving posture. Able to take a few sips of water as well with hand over hand assist. Set up in chair alarm on call bell in reach for next session.  Session 2: Pt greeted at time of session finishing up from PT, hand off to OT. Pt transported to gym for  time and focus of session on standing balance, following commands, and attending to R side. Sit <> stand on outside of parallel bars facing mirror with Mod A fading to Min A at times, lateral weight shifting with tactile cues for weight bearing, mirror for feedback. Standing approx 7-8 minutes with unilateral support on bar to perform one handed task of placing pegs in board Max A for sequencing and problem solving/terminating tasks. Perseverating on trying to draw with pegs as if they were crayons and at one point trying to eat pegs but redirected. R lean varied throughout standing task sometimes minimal and at other times needing Mod A to correct. Stand pivot 1 assist wheelchair <> mat with Mod/Max A for standing balance and no AD (spoke with previous PT and did better with HHA), with physical assist to weight shift and physical assist to manage RLE. Sitting EOM 1x10 lateral leans for weight bearing on each UE and 2x15 large therapy ball rolls to promote anterior weight shift and decrease extensor tone. Transported back to room, family meeting with palliative. Alarm on call bell in reach. Much improved skill performance in afternoon session.    Therapy Documentation Precautions:  Precautions Precautions: Fall Precaution Comments: expressive aphasia, very hard of hearing (lost left hearing aid); left inattention; right pusher; right sided weakness/apraxia Restrictions Weight Bearing Restrictions: No     Therapy/Group: Individual Therapy  Erasmo Score 08/08/2020, 7:15 AM

## 2020-08-09 NOTE — Progress Notes (Signed)
PROGRESS NOTE   Subjective/Complaints:  Pt stared at me- per nursing, no specific issues.   ROS: unable to assess due to aphasia/cognition  Objective:   No results found. No results for input(s): WBC, HGB, HCT, PLT in the last 72 hours.  No results for input(s): NA, K, CL, CO2, GLUCOSE, BUN, CREATININE, CALCIUM in the last 72 hours.   Intake/Output Summary (Last 24 hours) at 08/09/2020 1401 Last data filed at 08/08/2020 1745 Gross per 24 hour  Intake 177 ml  Output --  Net 177 ml        Physical Exam: Vital Signs Blood pressure (!) 159/98, pulse 87, temperature 97.9 F (36.6 C), temperature source Oral, resp. rate 18, weight 65.6 kg, SpO2 97 %.     General: awake, alert, staring at me for prolonged period- shook head no one time- if any other issues; appears angry vs confused?-  NAD HENT: conjugate gaze; oropharynx moist- bruising on R face/neck turning more brown CV: regular rate; no JVD Pulmonary: CTA B/L; no W/R/R- good air movement GI: soft, NT, ND, (+)BS Psychiatric: very flat- angry vs confused, vs just flat? Neurological: aphasic- awake, but non communicative  Neuro: aphasic- nodded yes and no to simple commands/questions- sounds like more awake this AM Neuro: Lethargic. Opens eyes and make eye contact. Left sided neglect- not moving left sided extremities- not following commands consistently due to fatigue/cognition. Grips right hand firmly 4/5 strength. Able to state her name and her niece's name.   Assessment/Plan: 1. Functional deficits which require 3+ hours per day of interdisciplinary therapy in a comprehensive inpatient rehab setting. Physiatrist is providing close team supervision and 24 hour management of active medical problems listed below. Physiatrist and rehab team continue to assess barriers to discharge/monitor patient progress toward functional and medical goals  Care Tool:  Bathing     Body parts bathed by patient: Chest, Abdomen, Right upper leg, Left upper leg, Face   Body parts bathed by helper: Front perineal area     Bathing assist Assist Level: Maximal Assistance - Patient 24 - 49%     Upper Body Dressing/Undressing Upper body dressing   What is the patient wearing?: Bra, Pull over shirt    Upper body assist Assist Level: Maximal Assistance - Patient 25 - 49%    Lower Body Dressing/Undressing Lower body dressing      What is the patient wearing?: Pants     Lower body assist Assist for lower body dressing: Total Assistance - Patient < 25%     Toileting Toileting    Toileting assist Assist for toileting: Dependent - Patient 0%     Transfers Chair/bed transfer  Transfers assist     Chair/bed transfer assist level: Total Assistance - Patient < 25%     Locomotion Ambulation   Ambulation assist      Assist level: 2 helpers Assistive device: Hand held assist Max distance: 50   Walk 10 feet activity   Assist  Walk 10 feet activity did not occur: Safety/medical concerns  Assist level: 2 helpers Assistive device: Hand held assist   Walk 50 feet activity   Assist Walk 50 feet with 2 turns activity did  not occur: Safety/medical concerns  Assist level: 2 helpers Assistive device: Hand held assist    Walk 150 feet activity   Assist Walk 150 feet activity did not occur: Safety/medical concerns         Walk 10 feet on uneven surface  activity   Assist Walk 10 feet on uneven surfaces activity did not occur: Safety/medical concerns         Wheelchair     Assist Will patient use wheelchair at discharge?: No   Wheelchair activity did not occur: N/A         Wheelchair 50 feet with 2 turns activity    Assist    Wheelchair 50 feet with 2 turns activity did not occur: N/A       Wheelchair 150 feet activity     Assist  Wheelchair 150 feet activity did not occur: N/A       Medical Problem List  and Plan: 1.  Right side weakness with mild receptive aphasia/dysarthria secondary to moderate-sized left MCA infarction             -patient may  shower             -ELOS/Goals: 2-3 wks MinA  -Initial CIR evals today  -please call niece Ellen Barajas with any changes in status  -reviewed MRI results with Ellen Barajas- shows new right PCA infarct- discussed with neurology- no change in management.   -palliative care consult placed  Continue CIR- PT, OT and SLP 2.  Antithrombotics: -DVT/anticoagulation: Xarelto             -antiplatelet therapy: Plavix 75 mg daily 3. Pain Management: Tylenol as needed 4. Mood: Provide emotional support             -antipsychotic agents: N/A 5. Neuropsych: This patient is NOT capable of making decisions on her own behalf. 6. Skin/Wound Care: Routine skin checks 7. Fluids/Electrolytes/Nutrition: Routine in and outs with follow-up chemistries. Labs reviewed and stable.  8.  Chronic atrial fibrillation.  Continue Xarelto.  Follow-up cardiology services with daily EKGs  7/15- HR on EKG was 111- RVR- however down to 100- BP is somewhat elevated, so will increase Metoprolol to 50 mg BID  9.  Hypertension/atrial fibrillation.  Continue Cozaar 25 mg daily, Lopressor 37.5 mg twice daily.    7/15- have room to increase Metoprolol- willdo so, since Rate fast.   7/16- HR 69 this AM- much improved con't regimen  7/17- HR 87 and BP 159/80s- con't regimen since just increased B blocker 10.  Chronic systolic congestive heart failure.  Lasix 20 mg daily.  Monitor for any signs of fluid overload  7/15- will order daily weights  7/16- will monitor for trend 11.  CAD with history of CABG. follow-up cardiology services 12.  Hyperlipidemia.  Lipitor 13.  Multiple facial fractures.  Follow-up outpatient DR Elenore Rota.  Placed on empiric Keflex x7 days 14. Bowel and Bladder incont, likely cognitive/awareness, timed toileting- discussed with niece  7/15- LBM yesterday- starting timed voiding for  incontinence.  15. Hard of hearing: please use masks with transparent piece over lips when possible- discussed with therapy and niece  7/15- found hearing aids- con't to use esp with therapies.  16. Decreased initiation  7/16- started low dose Amantadine- for now.   7/17- no change so far, but only gotten 2 days so far.   LOS: 4 days A FACE TO FACE EVALUATION WAS PERFORMED  Ellen Barajas 08/09/2020, 2:01 PM

## 2020-08-10 DIAGNOSIS — I10 Essential (primary) hypertension: Secondary | ICD-10-CM

## 2020-08-10 DIAGNOSIS — I482 Chronic atrial fibrillation, unspecified: Secondary | ICD-10-CM

## 2020-08-10 DIAGNOSIS — R482 Apraxia: Secondary | ICD-10-CM

## 2020-08-10 NOTE — Progress Notes (Signed)
Patient is alert and oriented to self, reoriented x2, remains uncooperative with taking her medications, will not open her mouth for medications. openly swings to knock medications out of writer hand on several occassions,, continue to refuse po liquids and medications. Incontinent care provided and skin care, Monitor and assisted,

## 2020-08-10 NOTE — Progress Notes (Signed)
Occupational Therapy Session Note  Patient Details  Name: Ellen Barajas MRN: [id number] Date of Birth: September 07, 1928  Today's Date: 08/10/2020 OT Individual Time: 6606-3016; 0109-3235 OT Individual Time Calculation (min): 40 min ; 59 min   Short Term Goals: Week 1:  OT Short Term Goal 1 (Week 1): Pt will complete UB dressing with mod assist using hemi techniques OT Short Term Goal 2 (Week 1): Pt will complete sit<>stand with min assist in preperation for LB dressing and bathing. OT Short Term Goal 3 (Week 1): Pt will scan to left visual field to retrieve items from sink with mod cues. OT Short Term Goal 4 (Week 1): Pt will complete toilet transfer with mod assist.  Skilled Therapeutic Interventions/Progress Updates:    First session: Pt supine in bed, flat affect, very minimal one word answers with significant amount of time needed to process questions.  Pt reporting "no" when prompted to participate in skilled OT and resistive to assist with bed mobility and closing her eyes.  OT collaborted with pts niece via phone with pt present to identify pt centered interests in order to incoorporate in sessions and facilitate increased pt participation.  Per niece, pt loves singing and listening to hymn music and she loves to draw.  Niece requesting that if pt does not want to participate during therapy "please don't push her to do anything". OT played common various hymnal songs placed on pts left side and cue'd pt to left encouraging to try to sing along.  Pt reports "I cant remember" however appearing more alert with eyes open and scanning to left to look at therapist.  Call bell in reach, bed alarm on at end of session.  Second session:  Pt appearing distressed with scowl on face, however when OT asked pt if she was in pain pt responded "no".  Pt asked if she needed to go to the bathroom and pt responded "I dont know".  Instructed pt through right rolling and supine to sit needing max assist and max simple  multimodal cues with increased time allowed for processing.  Pt pushing to right once sitting EOB requiring mod assist to achieve midline.  Pt continuing to attempt to lean therefore allowed pt to return to supine. Upon closer assessment, incontinent episode of bowel and bladder noted with soiled brief.  Pt required total assist for clothing mgt and pericare and +2 assist for bed mobility.  Provided pt with coloring sheets and crayons however pt reporting "no".  Left with pt to complete if wanted.  Call bell in reach, bed alarm on.    Therapy Documentation Precautions:  Precautions Precautions: Fall Precaution Comments: expressive aphasia, very hard of hearing (lost left hearing aid); left inattention; right pusher; right sided weakness/apraxia Restrictions Weight Bearing Restrictions: No   Therapy/Group: Individual Therapy  Amie Critchley 08/10/2020, 12:56 PM

## 2020-08-10 NOTE — Progress Notes (Signed)
PROGRESS NOTE   Subjective/Complaints:  Pt up with PT. Followed basic commands with extra time and attempted to verbalize responses to me!  ROS: Limited due to cognitive/behavioral    Objective:   No results found. No results for input(s): WBC, HGB, HCT, PLT in the last 72 hours.  No results for input(s): NA, K, CL, CO2, GLUCOSE, BUN, CREATININE, CALCIUM in the last 72 hours.   Intake/Output Summary (Last 24 hours) at 08/10/2020 1051 Last data filed at 08/10/2020 0749 Gross per 24 hour  Intake 417 ml  Output --  Net 417 ml        Physical Exam: Vital Signs Blood pressure 127/63, pulse 95, temperature 97.9 F (36.6 C), temperature source Axillary, resp. rate 14, weight 65.6 kg, SpO2 96 %.     Constitutional: No distress . Vital signs reviewed. HEENT: EOMI, oral membranes moist Neck: supple Cardiovascular: IRR with systolic murmur. No JVD    Respiratory/Chest: CTA Bilaterally without wheezes or rales. Normal effort    GI/Abdomen: BS +, non-tender, non-distended Ext: no clubbing, cyanosis, or edema Psych: pleasant and cooperative  Neuro: aphasic- nodded yes and no to simple commands/questions- verbalized today mostly garble. Did answer one question appropriately. Followed a few simple commands. Still very apraxic. Alert. Left sided neglect- moves left side automatically, less to command. Grips right hand firmly 4/5 strength.    Assessment/Plan: 1. Functional deficits which require 3+ hours per day of interdisciplinary therapy in a comprehensive inpatient rehab setting. Physiatrist is providing close team supervision and 24 hour management of active medical problems listed below. Physiatrist and rehab team continue to assess barriers to discharge/monitor patient progress toward functional and medical goals  Care Tool:  Bathing    Body parts bathed by patient: Chest, Abdomen, Right upper leg, Left upper leg,  Face   Body parts bathed by helper: Front perineal area     Bathing assist Assist Level: Maximal Assistance - Patient 24 - 49%     Upper Body Dressing/Undressing Upper body dressing   What is the patient wearing?: Bra, Pull over shirt    Upper body assist Assist Level: Maximal Assistance - Patient 25 - 49%    Lower Body Dressing/Undressing Lower body dressing      What is the patient wearing?: Pants     Lower body assist Assist for lower body dressing: Total Assistance - Patient < 25%     Toileting Toileting    Toileting assist Assist for toileting: Dependent - Patient 0%     Transfers Chair/bed transfer  Transfers assist     Chair/bed transfer assist level: Total Assistance - Patient < 25%     Locomotion Ambulation   Ambulation assist      Assist level: 2 helpers Assistive device: Hand held assist Max distance: 50   Walk 10 feet activity   Assist  Walk 10 feet activity did not occur: Safety/medical concerns  Assist level: 2 helpers Assistive device: Hand held assist   Walk 50 feet activity   Assist Walk 50 feet with 2 turns activity did not occur: Safety/medical concerns  Assist level: 2 helpers Assistive device: Hand held assist    Walk 150 feet activity  Assist Walk 150 feet activity did not occur: Safety/medical concerns         Walk 10 feet on uneven surface  activity   Assist Walk 10 feet on uneven surfaces activity did not occur: Safety/medical concerns         Wheelchair     Assist Will patient use wheelchair at discharge?: No   Wheelchair activity did not occur: N/A         Wheelchair 50 feet with 2 turns activity    Assist    Wheelchair 50 feet with 2 turns activity did not occur: N/A       Wheelchair 150 feet activity     Assist  Wheelchair 150 feet activity did not occur: N/A      BP 127/63 (BP Location: Right Arm)   Pulse 95   Temp 97.9 F (36.6 C) (Axillary)   Resp 14   Wt  65.6 kg   SpO2 96%   BMI 25.62 kg/m   Medical Problem List and Plan: 1.  Right side weakness with mild receptive aphasia/dysarthria secondary to moderate-sized left MCA infarction             -patient may  shower             -ELOS/Goals: 2-3 wks MinA  -Continue CIR therapies including PT, OT, and SLP   -please call niece [name] with any changes in status  -reviewed MRI results with [name]- shows new right PCA infarct- discussed with neurology- no change in management.   -palliative care consult placed  Continue CIR- PT, OT and SLP 2.  Antithrombotics: -DVT/anticoagulation: Xarelto             -antiplatelet therapy: Plavix 75 mg daily 3. Pain Management: Tylenol as needed 4. Mood: Provide emotional support             -antipsychotic agents: N/A 5. Neuropsych: This patient is NOT capable of making decisions on her own behalf. 6. Skin/Wound Care: Routine skin checks 7. Fluids/Electrolytes/Nutrition: Routine in and outs with follow-up chemistries. Labs reviewed and stable.  8.  Chronic atrial fibrillation.  Continue Xarelto.  Follow-up cardiology services with daily EKGs  7/15- HR on EKG was 111- RVR- however down to 100- BP is somewhat elevated, so will increase Metoprolol to 50 mg BID  9.  Hypertension/atrial fibrillation.  Continue Cozaar 25 mg daily, Lopressor 37.5 mg twice daily.    7/15- have room to increase Metoprolol- willdo so, since Rate fast.   7/16- HR 69 this AM- much improved con't regimen  7/18 controlled today 10.  Chronic systolic congestive heart failure.  Lasix 20 mg daily.  Monitor for any signs of fluid overload  7/15- will order daily weights   Filed Weights   08/08/20 0500  Weight: 65.6 kg    -7/18 need recorded weights  11.  CAD with history of CABG. follow-up cardiology services 12.  Hyperlipidemia.  Lipitor 13.  Multiple facial fractures.  Follow-up outpatient DR Elenore Rota.  Placed on empiric Keflex x7 days 14. Bowel and Bladder incont, likely  cognitive/awareness, timed toileting- discussed with niece  7/15- LBM yesterday- starting timed voiding for incontinence.  15. Hard of hearing: please use masks with transparent piece over lips when possible- discussed with therapy and niece  7/15- found hearing aids- con't to use esp with therapies.  16. Decreased initiation  7/18 amantadine started 7/16. Seems to have helped initiation and arousal by most accounts. I haven't seen her before today---continue on current dose  of 100mg  daily   LOS: 5 days A FACE TO FACE EVALUATION WAS PERFORMED  08/10/2020, 10:51 AM

## 2020-08-10 NOTE — Progress Notes (Addendum)
Speech Language Pathology Daily Session Note  Patient Details  Name: Ellen Barajas MRN: [id number] Date of Birth: August 27, 1928  Today's Date: 08/10/2020 SLP Individual Time: 0800-0900 SLP Individual Time Calculation (min): 60 min  Short Term Goals: Week 1: SLP Short Term Goal 1 (Week 1): Patient will tolerate Dys 1 diet and thin liquids without overt s/sx of aspiration with mod A verbal cues for use of swallowing compensatory strategies. SLP Short Term Goal 2 (Week 1): Patient will express wants/needs via multimodal communication w/ max A verbal/visual cues SLP Short Term Goal 3 (Week 1): Patient will name familiar objects with 50% accuracy with max A multimodal cues SLP Short Term Goal 4 (Week 1): Patient will answer basic yes/no questions utilizing multimodal communication with 50% accuracy with max A multimodal cues SLP Short Term Goal 5 (Week 1): Patient will match object to word from a field of two-three with max A verbal/visual cues. SLP Short Term Goal 6 (Week 1): Patient will demonstrate sustained attention to functional cognitive-linguistic tasks for 10 minutes with mod A verbal/visual cues for redirection.  Skilled Therapeutic Interventions:Skilled ST service focused on language goals. Pt expressed "no" verbally and with head nod to indicate she had not eaten breakfast, even when almost empty tray was presented to her. SLP donned on hearing aids which increased pt's verbal output, but pt continued to express "no" to having consumed breakfast, SLP suggest further impacted by poor recall verse only language impairment due to accuracy rate of simple yes/no questions. Pt was able to response to simple yes/no questions pertaining to biographical/environmental information in 8 out 10 opportunities. The use of yes/no board did not increase accuracy. Pt was able to press call bell with hand over hand assistance. SLP changed call bell to soft touch in which pt was able to read the sign "HELP" on the  soft touch and pressed it on command. Pt continued to press soft touch only stopping preservation when item was removed, same preservation occurred during witting assessment and marker had to be removed to stop preservation. Pt was unable to write name, fill in missing letter in name, and trace letters only able to complete hand over hand tracing of letter. Pt was able to fill in missing part of picture after initial hand over hand demonstration, demonstrating carryover not demonstrated in written letter task (filled in missing eye on smiley face picture.) Pt was unable to match word to a field of 2 objects in 3 opportunities with max semantic and demonstration cues no able to read at word level with phonemic and semantic errors.  Pt was unable to name objects in 5 opportunities with max A semantic and repetition cues. Pt was able to answer yes/no questions pertaining to object name in 2 out 5 opportunities with max semantic cues. Pt continues to demonstrate left inattention and at times reduced focused attention on objects. Pt was left with call bell within reach and bed alarm set. Recommend to continue ST services.   Pain Pain Assessment Pain Scale: 0-10 Pain Score: 0-No pain  Therapy/Group: Individual Therapy  Jaylee Lantry  [facility] 08/10/2020, 8:22 AM

## 2020-08-10 NOTE — Progress Notes (Signed)
Physical Therapy Session Note  Patient Details  Name: Ellen Barajas MRN: [id number] Date of Birth: [birth year]  Today's Date: 08/10/2020 PT Individual Time: 6378-5885 and 900-945 PT Individual Time Calculation (min): 15 min and 45 min  Short Term Goals: Week 1:  PT Short Term Goal 1 (Week 1): Pt will complete bed mobility with modA PT Short Term Goal 2 (Week 1): Pt will complete bed<>chair transfers wtih modA and LRAD PT Short Term Goal 3 (Week 1): Pt will ambulate 84ft with modA and LRAD Week 2:    Week 3:     Skilled Therapeutic Interventions/Progress Updates:    Session one: Pain:  Pt reports no pain.  Treatment to tolerance.  Rest breaks and repositioning as needed.  Pt initially awake, supine, alert but not verbalizing w/therapist.  Rolls L/R w/max gestural and tactile cues to initiate, max assist to complete.  Brief changed by therapist, pt incontinent of bowels, pt lifts feet for threading of pants w/max tactile cues to initiate.  Rollw L/R as above for therapist to raise pants to hips.  Pt then attempts using LUE to raise pants over L hip, max assist to roll/complete L, total assist for R.   Pt handed off to ST for next session.  Pain:  Pt  No indications of pain.  states "no", tends to echo therapist.  Some verbalizations during session today but only in response to direct questioning, single word responses. Treatment to tolerance.  Rest breaks and repositioning as needed.  Pt initially supine, awake, alert. Max assist/cues to roll and siede to sit on edte of bed.  Leans to R in sitting.   Worked on reaching to L to hold foot of bed for R trunk elongation. Removes shirt w/max assist to initiate, mod assist to complete. Dons clean shirt w/max assist to intitiatem mod assit to complete  Overall min assist for balance in sittng.  Sit to stand w/mod assist, stand pivot transfer to wc w/max tactile cues for sequencing/wt shift/initiation. Pt did demonstrate improved scooting w/min  tactile cues to initiate, mod assist to complete  Brushes hair in wc w/max cues to initiate, initially brings brush to mouth, redirected then brushes on L only using L hand.  Hand over hand total assist for RUE but does grasp brush w/hand.  Transported to gym Functional gait:  using grocery cart to facilitate automatic gait task, pt ambulated 10ft w/assist to stabilize R hand, advances RLE w/decreased step length, min assist for wt shift L w/swing R, decreased R clearance, R knee wobbles w/fatigue, R hip trendelenberg w/fatigue.  Worked again on scooting in wc for repositioning as above.  In sitting worked on using R hand to grasp clothespins and place in box.  Hand over hand total assist, pt did not engage in task w/repeated efforts.    Pt falling asleep in wc therefore therapist did not leave oob in wc.  stand pivot transfer wc to bed w/max cues for initiation, sequencing, continued progression of turning as pt "freezing" during task requiring heavy tactile cueing.  NT educated re: safe transfers w/pt.  Pt handed off to NT as session timed out.   Therapy Documentation Precautions:  Precautions Precautions: Fall Precaution Comments: expressive aphasia, very hard of hearing (lost left hearing aid); left inattention; right pusher; right sided weakness/apraxia Restrictions Weight Bearing Restrictions: No   Therapy/Group: Individual Therapy  [name] 08/10/2020, 3:50 PM

## 2020-08-11 MED ORDER — METOPROLOL TARTRATE 50 MG PO TABS
75.0000 mg | ORAL_TABLET | Freq: Two times a day (BID) | ORAL | Status: DC
  Administered 2020-08-11 – 2020-08-20 (×18): 75 mg via ORAL
  Filled 2020-08-11 (×18): qty 1

## 2020-08-11 NOTE — Progress Notes (Signed)
Occupational Therapy Session Note  Patient Details  Name: Ellen Barajas MRN: [id number] Date of Birth: [birth year]  Today's Date: 08/11/2020 OT Individual Time: 6712-4580 OT Individual Time Calculation (min): 15 min    Short Term Goals: Week 1:  OT Short Term Goal 1 (Week 1): Pt will complete UB dressing with mod assist using hemi techniques OT Short Term Goal 2 (Week 1): Pt will complete sit<>stand with min assist in preperation for LB dressing and bathing. OT Short Term Goal 3 (Week 1): Pt will scan to left visual field to retrieve items from sink with mod cues. OT Short Term Goal 4 (Week 1): Pt will complete toilet transfer with mod assist.  Skilled Therapeutic Interventions/Progress Updates:    Pt semi upright in bed, flat affect, and nonverbal.  Pt staring blankly at therapist when asked if she had any pain and also if she wanted to participate in OT.  Gently provided multimodal cues to initiate bed mobility for EOB sitting.  Pt required total assist +1 supine to sit, however pt not resistant to OT assist efforts.  Therapist placed hairbrush in front of pt and provided simple cues instructing pt to brush her hair.  Pt stared blankly at therapist and required max hand over hand to initiate.  Once assisted to initiate pt did continue task but perseverating on same spot of hair that OT placed pt over despite instructions to try brushing other side and back.  Pt needing frequent intermittent mod assist to maintain static sitting balance without BUE support due to pt losing balance posteriorly.  Pt closing her eyes sitting EOB and stopped participating.  Returned pt to supine with total assist for safety and repositioned towards [facility] with total assist+2.  Call bell in reach, bed alarm on. Direct hand off to nurse tech for vitals check.    Therapy Documentation Precautions:  Precautions Precautions: Fall Precaution Comments: expressive aphasia, very hard of hearing (lost left hearing aid); left  inattention; right pusher; right sided weakness/apraxia Restrictions Weight Bearing Restrictions: No    Therapy/Group: Individual Therapy  Amie Critchley 08/11/2020, 2:05 PM

## 2020-08-11 NOTE — Progress Notes (Signed)
Occupational Therapy Session Note  Patient Details  Name: Ellen Barajas MRN: [id number] Date of Birth: 22-Dec-1928  Today's Date: 08/11/2020 OT Individual Time: 3762-8315 OT Individual Time Calculation (min): 42 min    Short Term Goals: Week 1:  OT Short Term Goal 1 (Week 1): Pt will complete UB dressing with mod assist using hemi techniques OT Short Term Goal 2 (Week 1): Pt will complete sit<>stand with min assist in preperation for LB dressing and bathing. OT Short Term Goal 3 (Week 1): Pt will scan to left visual field to retrieve items from sink with mod cues. OT Short Term Goal 4 (Week 1): Pt will complete toilet transfer with mod assist.  Skilled Therapeutic Interventions/Progress Updates:    Pt in bed to start asleep but easily awoken with tactile cueing.  She did not respond initially to therapist's questions verbally or with head nods.  When asked if she needed to go to the bathroom, she eventually nodded her head "yes".  Total assist for transfer from supine to sit EOB with max assist for functional mobility into the bathroom, with use of the RW with hand grip on the right side.  She needed initial max assist for advancement of the walker with max demonstrational cueing for initiation of stepping forward with the RLE.  She was able to step forward with the LLE spontaneously.  Increased lean/pushing to the right noted as well.  The closer she got to the bathroom the better she started to initiate stepping forward with the right.  Total assist for all clothing management as well as peri cleaning and donning new brief.  She then ambulated back out to the wheelchair at the same level as stated previously and sat in the wheelchair for washing her hands.  Max hand over hand assist for initiation of washing her hands and rinsing as well as for drying.  When presented with a washcloth and told to was her face, she did not initiate task.  Therapist then provided mod assist hand over hand for initiation  of task and for thorough completion, using the LUE only.  Finished session with transfer back to the bed with max assist and then to supine at the same level.  Pt left with the soft touch call button in reach and safety alarm in place.  No verbal output noted during session and she would only stare blankly when therapist would repeatedly ask her questions such as "Are you finished using the bathroom?".      Therapy Documentation Precautions:  Precautions Precautions: Fall Precaution Comments: expressive aphasia, very hard of hearing (lost left hearing aid); left inattention; right pusher; right sided weakness/apraxia Restrictions Weight Bearing Restrictions: No  Pain: Pain Assessment Pain Scale: Faces Pain Score: 0-No pain ADL: See Care Tool Section for some details of mobility and selfcare   Therapy/Group: Individual Therapy  Tyriana Helmkamp OTR/L 08/11/2020, 4:15 PM

## 2020-08-11 NOTE — Progress Notes (Signed)
Speech Language Pathology Daily Session Note  Patient Details  Name: Ellen Barajas MRN: [id number] Date of Birth: 11/29/1928  Today's Date: 08/11/2020 SLP Individual Time: 1962-2297 SLP Individual Time Calculation (min): 40 min  Short Term Goals: Week 1: SLP Short Term Goal 1 (Week 1): Patient will tolerate Dys 1 diet and thin liquids without overt s/sx of aspiration with mod A verbal cues for use of swallowing compensatory strategies. SLP Short Term Goal 2 (Week 1): Patient will express wants/needs via multimodal communication w/ max A verbal/visual cues SLP Short Term Goal 3 (Week 1): Patient will name familiar objects with 50% accuracy with max A multimodal cues SLP Short Term Goal 4 (Week 1): Patient will answer basic yes/no questions utilizing multimodal communication with 50% accuracy with max A multimodal cues SLP Short Term Goal 5 (Week 1): Patient will match object to word from a field of two-three with max A verbal/visual cues. SLP Short Term Goal 6 (Week 1): Patient will demonstrate sustained attention to functional cognitive-linguistic tasks for 10 minutes with mod A verbal/visual cues for redirection.  Skilled Therapeutic Interventions: Skilled ST services focused on language skills. Pt demonstrated increase expressive and receptive abilities as well in increased sustained attention when items presented on right side compared to yesterday's session. Pt was able to respond to yes/no questions pertaining to biographical/environmental information in 3 out 5 opportunities with verbal response and with use of yes/no visual aid board in 2 out 5 opportunities. Pt continues to demonstrate severe-moderate deficits in expressing wants needs at word/phrase level with halting speech as well as when given yes/no questions. Pt was able to response to yes/no question pertaining to object name in 8 out 10 opportunities with supervision A semantic cues and name same objects in 10 out 10 opportunities with  moderate repetition of command. Pt was able to name novel objects in 9 out 10 opportunities with supervision A repetition of commands, as well as function of object at phrase/sentence level in 9 out 10 opportunities with mild halting speech. Pt was only able to identify 2 out 5 objects when presented in a field of 2 on right side, impacted by fading sustained attention at the end of the session. Pt demonstrated sustained attention in 5-10 intervals with mod A multimodal cues. Pt was left with call bell within reach and bed alarm set. Recommend to continue skilled ST services.      Pain Pain Assessment Pain Scale: 0-10 Pain Score: 0-No pain  Therapy/Group: Individual Therapy  Arika Mainer  [facility] 08/11/2020, 8:40 AM

## 2020-08-11 NOTE — Progress Notes (Signed)
PROGRESS NOTE   Subjective/Complaints: Opens eyes and makes eye contact Not currently answering any om questions or trying to verbalize  ROS: Limited due to cognitive/behavioral    Objective:   No results found. No results for input(s): WBC, HGB, HCT, PLT in the last 72 hours.  No results for input(s): NA, K, CL, CO2, GLUCOSE, BUN, CREATININE, CALCIUM in the last 72 hours.   Intake/Output Summary (Last 24 hours) at 08/11/2020 1032 Last data filed at 08/11/2020 0805 Gross per 24 hour  Intake 655 ml  Output --  Net 655 ml        Physical Exam: Vital Signs Blood pressure (!) 173/77, pulse 88, temperature 98.3 F (36.8 C), resp. rate 16, weight 65.6 kg, SpO2 98 %. Gen: no distress, normal appearing HEENT: oral mucosa pink and moist, NCAT Cardio: Reg rate Chest: normal effort, normal rate of breathing Abd: soft, non-distended Ext: no edema Psych: pleasant, normal affect Skin: intact Neuro: aphasic- nodded yes and no to simple commands/questions- verbalized today mostly garble. Did answer one question appropriately. Followed a few simple commands. Still very apraxic. Alert. Left sided neglect- moves left side automatically, less to command. Grips right hand firmly 4/5 strength.    Assessment/Plan: 1. Functional deficits which require 3+ hours per day of interdisciplinary therapy in a comprehensive inpatient rehab setting. Physiatrist is providing close team supervision and 24 hour management of active medical problems listed below. Physiatrist and rehab team continue to assess barriers to discharge/monitor patient progress toward functional and medical goals  Care Tool:  Bathing    Body parts bathed by patient: Chest, Abdomen, Right upper leg, Left upper leg, Face   Body parts bathed by helper: Front perineal area     Bathing assist Assist Level: Maximal Assistance - Patient 24 - 49%     Upper Body  Dressing/Undressing Upper body dressing   What is the patient wearing?: Bra, Pull over shirt    Upper body assist Assist Level: Maximal Assistance - Patient 25 - 49%    Lower Body Dressing/Undressing Lower body dressing      What is the patient wearing?: Pants     Lower body assist Assist for lower body dressing: Total Assistance - Patient < 25%     Toileting Toileting    Toileting assist Assist for toileting: Dependent - Patient 0%     Transfers Chair/bed transfer  Transfers assist     Chair/bed transfer assist level: Total Assistance - Patient < 25%     Locomotion Ambulation   Ambulation assist      Assist level: 2 helpers Assistive device: Hand held assist Max distance: 50   Walk 10 feet activity   Assist  Walk 10 feet activity did not occur: Safety/medical concerns  Assist level: 2 helpers Assistive device: Hand held assist   Walk 50 feet activity   Assist Walk 50 feet with 2 turns activity did not occur: Safety/medical concerns  Assist level: 2 helpers Assistive device: Hand held assist    Walk 150 feet activity   Assist Walk 150 feet activity did not occur: Safety/medical concerns         Walk 10 feet on uneven surface  activity   Assist Walk 10 feet on uneven surfaces activity did not occur: Safety/medical concerns         Wheelchair     Assist Will patient use wheelchair at discharge?: No   Wheelchair activity did not occur: N/A         Wheelchair 50 feet with 2 turns activity    Assist    Wheelchair 50 feet with 2 turns activity did not occur: N/A       Wheelchair 150 feet activity     Assist  Wheelchair 150 feet activity did not occur: N/A      BP (!) 173/77   Pulse 88   Temp 98.3 F (36.8 C)   Resp 16   Wt 65.6 kg   SpO2 98%   BMI 25.62 kg/m   Medical Problem List and Plan: 1.  Right side weakness with mild receptive aphasia/dysarthria secondary to moderate-sized left MCA  infarction             -patient may  shower             -ELOS/Goals: 2-3 wks MinA  -Continue CIR therapies including PT, OT, and SLP   -please call niece Ellen Barajas with any changes in status  -reviewed MRI results with Ellen Barajas- shows new right PCA infarct- discussed with neurology- no change in management.   -palliative care consult placed 2.  Antithrombotics: -DVT/anticoagulation: Xarelto             -antiplatelet therapy: Plavix 75 mg daily 3. Pain Management: Tylenol as needed 4. Mood: Provide emotional support             -antipsychotic agents: N/A 5. Neuropsych: This patient is NOT capable of making decisions on her own behalf. 6. Skin/Wound Care: Routine skin checks 7. Fluids/Electrolytes/Nutrition: Routine in and outs with follow-up chemistries. Labs reviewed and stable.  8.  Chronic atrial fibrillation.  Continue Xarelto.  Follow-up cardiology services with daily EKGs: stable with metoprolol 50mg  BID.  9.  Hypertension/atrial fibrillation.  Continue Cozaar 25 mg daily, Lopressor 37.5 mg twice daily. BP elevated to 173/77- increase Lopressor to 75mg  BID 10.  Chronic systolic congestive heart failure.  Lasix 20 mg daily.  Monitor for any signs of fluid overload  7/15- will order daily weights   Filed Weights   08/08/20 0500 08/11/20 0500 08/11/20 0633  Weight: 65.6 kg 65.6 kg 65.6 kg    -7/18 need recorded weights  11.  CAD with history of CABG. follow-up cardiology services 12.  Hyperlipidemia.  Lipitor 13.  Multiple facial fractures.  Follow-up outpatient DR 08/13/20.  Placed on empiric Keflex x7 days 14. Bowel and Bladder incont, likely cognitive/awareness, timed toileting- discussed with niece 78. Hard of hearing: please use masks with transparent piece over lips when possible- discussed with therapy and niece. Use hearing aides.  16. Decreased initiation  7/18 amantadine started 7/16. Seems to have helped initiation and arousal by most accounts. I haven't seen her before  today---continue on current dose of 100mg  daily   LOS: 6 days A FACE TO FACE EVALUATION WAS PERFORMED  8/18 P Zackerie Sara 08/11/2020, 10:32 AM

## 2020-08-11 NOTE — Progress Notes (Signed)
Patient ID: Ellen Barajas, female   DOB: December 06, 1928, 87 y.o.   MRN: [id number] Spoke with [name]-niece via telephone to discuss team conference goals of min-mod level and target discharge date of 7/28. She was made 15/7 MD wants to see if can make progress. Overall plan is home with hospice and hired 24/7 caregivers.[name] does want to use [facility], will reach out to them and begin the process. Pt participates at times and other times does not, carla aware and feels she may be giving up. Continue to work on discharge plans.

## 2020-08-11 NOTE — Patient Care Conference (Signed)
Inpatient RehabilitationTeam Conference and Plan of Care Update Date: 08/11/2020   Time: 13:17 PM    Patient Name: Ellen Barajas      Medical Record Number: [id number]  Date of Birth: 1928/07/03 Sex: Female         Room/Bed: 4W21C/4W21C-01 Payor Info: Payor: [employer]ICARE / Plan: BCBS MEDICARE / Product Type: *No Product type* /    Admit Date/Time:  08/05/2020  4:37 PM  Primary Diagnosis:  Embolism of left anterior cerebral artery  Hospital Problems: Principal Problem:   Embolism of left anterior cerebral artery Active Problems:   Left middle cerebral artery stroke [facility])    Expected Discharge Date: Expected Discharge Date: 08/20/20  Team Members Present: Physician leading conference: Dr. Sula Soda Care Coodinator Present: Becky Dupree, LCSW;Arkel Cartwright Lambert Mody, RN, BSN, CRRN Nurse Present: Chana Bode, RN PT Present: Rada Hay, PT OT Present: Dolphus Jenny, OT SLP Present: Colin Benton, SLP PPS Coordinator present : Fae Pippin, SLP     Current Status/Progress Goal Weekly Team Focus  Bowel/Bladder   Pt is incontinent of bowel and bladder  Pt will be toileted q2h  Will assess qshift and PRN   Swallow/Nutrition/ Hydration   dys 1 textures and thin liquids, Max-mod A  Min A  carryover of swallow startegies   ADL's   max/total assist UB adls, total assist LB adls; max assist functional mobility; inconsistent participation  min A-CGA  self care training, visual scanning, functional transfer training   Mobility   max assist bed mobility, mod to max assist transfers, mod to max assist gait up to 85ft but much difficulty managing AD due to apraxia, inattention  min assist      Communication   Max-mod A able to verbalize word/phrase level, 60% yes/no, naming objects 90%  mod A  yes/no response, object naming, object identifying, following commands   Safety/Cognition/ Behavioral Observations  Max-Mod A, left inattention and reduced focus/sustained  attention  Min A  sustained/focused attention   Pain   Pt's facial expression indicates no sign of pain  Pt will remain pain free  Will assess qshift and PRN   Skin   Pt's skin is intact  Pt's skin will remain intact  Will assess qshift and PRN     Discharge Planning:  Home with hired 24/7 assistr-niece working on now. Aware will need care. Talking with palliative regarding home with hospice   Team Discussion: Patient with new stroke after rehab admit. Tachycardia addressed per MD and medications adjusted. Ritalin added and staff note patient more alert, interactive  and verbally responsive today. Receptively following commands still difficult.  Patient on target to meet rehab goals: Currently mod - max assist for communication and eating. Currently on a D1 diet. Requires mod assist to ambulate 67' however patient is incontinent of bowel and bladder. *See Care Plan and progress notes for long and short-term goals.   Revisions to Treatment Plan:  Changed to a 15/7 therapy schedule   Teaching Needs: Medications, care, toileting, transfers, secondary stroke risk management  Current Barriers to Discharge: Decreased caregiver support and Home enviroment access/layout  Possible Resolutions to Barriers: Hospice referral/follow up Family education Family hired caregivers to assist with care needs     Medical Summary Current Status: new stroke with worsening apraxia, weakness, and aphasia, bowel and bladder incontinence, atrial fibrillation, hypertension, impaired initation  Barriers to Discharge: Neurogenic Bowel & Bladder;Medical stability;Behavior  Barriers to Discharge Comments: new stroke with worsening apraxia, weakness, and aphasia, bowel  and bladder incontinence, atrial fibrillation, hypertension, impaired initation Possible Resolutions to [employer] Focus: continue amantadine for initation, increase lopressor to 75mg  BID, continue bowel and bladder program   Continued Need  for Acute Rehabilitation Level of Care: The patient requires daily medical management by a physician with specialized training in physical medicine and rehabilitation for the following reasons: Direction of a multidisciplinary physical rehabilitation program to maximize functional independence : Yes Medical management of patient stability for increased activity during participation in an intensive rehabilitation regime.: Yes Analysis of laboratory values and/or radiology reports with any subsequent need for medication adjustment and/or medical intervention. : Yes   I attest that I was present, lead the team conference, and concur with the assessment and plan of the team.   B 08/11/2020, 5:10 PM

## 2020-08-12 ENCOUNTER — Encounter: Payer: Self-pay | Admitting: *Deleted

## 2020-08-12 DIAGNOSIS — I6621 Occlusion and stenosis of right posterior cerebral artery: Secondary | ICD-10-CM

## 2020-08-12 NOTE — Progress Notes (Signed)
Oriented to name otherwise occasional verbal response when questions are asked,[place] adequate, respiration unlabored on RA..incontinent care provided of bladder, made as comfortable as possible. Monitor and assisted, encourage liquids and po ,taken small amounts. Facial bruising resolving to ride side of face and right UE/LE.Continue regime throughout Shift. Safety measures unforced to prevent fall .

## 2020-08-12 NOTE — Congregational Nurse Program (Signed)
CN visited patient  with coworker Dulce Sellar, RN.  Patient completely nonverbal and exhibited flat affect this morning with [place].  Jeronimo Norma attempted to draw out patient with multiple questions and reminiscences about her husband, but patient did not respond.  According to note by social worker, patient is scheduled to go home on 08/20/20 with hospice care and patient's niece, [name] will seek other caregivers to help in the home.

## 2020-08-12 NOTE — Progress Notes (Signed)
Physical Therapy Session Note  Patient Details  Name: Ellen Barajas MRN: [id number] Date of Birth: 03-01-1928  Today's Date: 08/12/2020 PT Individual Time: 1300-1400 PT Individual Time Calculation (min): 60 min   Short Term Goals: Week 1:  PT Short Term Goal 1 (Week 1): Pt will complete bed mobility with modA PT Short Term Goal 2 (Week 1): Pt will complete bed<>chair transfers wtih modA and LRAD PT Short Term Goal 3 (Week 1): Pt will ambulate 50ft with modA and LRAD  Skilled Therapeutic Interventions/Progress Updates:    Pt received seated in bed, appears agreeable to PT session. No indications of pain. Pt nonverbal throughout entire session except for one instance of stating "no" in response to a question. Assisted pt with donning pants dependently at bed level. Supine to sit with assist x 2 for LE management and trunk elevation. Squat pivot transfer with total A x 2 throughout session. Sit to stand with max A to shopping cart. Pt able to ambulate x 10 ft with shopping cart with assist x 2 for safety and balance. Pt initially exhibits good stepping pattern but as gait progresses unable to advance RLE. Assisted pt with lateral weight shift onto LLE and to advance RLE dependently. Ambulation x 10 ft with RW in similar manner as with shopping cart. Pt then found to be incontinent of bowel. Transferred back to bed. Pt is dependent for pericare and brief change. Transferred patient back to w/c. Pt left seated in w/c in room with needs in reach, quick release belt and chair alarm in place at end of session.  Therapy Documentation Precautions:  Precautions Precautions: Fall Precaution Comments: expressive aphasia, very hard of hearing (lost left hearing aid); left inattention; right pusher; right sided weakness/apraxia Restrictions Weight Bearing Restrictions: No    Therapy/Group: Individual Therapy   Peter Congo, PT, DPT, CSRS   08/12/2020, 5:44 PM

## 2020-08-12 NOTE — Progress Notes (Signed)
Speech Language Pathology Daily Session Note  Patient Details  Name: Ellen Barajas MRN: [id number] Date of Birth: 11/16/1928  Today's Date: 08/12/2020 SLP Individual Time: 0800-0900 SLP Individual Time Calculation (min): 60 min  Short Term Goals: Week 1: SLP Short Term Goal 1 (Week 1): Patient will tolerate Dys 1 diet and thin liquids without overt s/sx of aspiration with mod A verbal cues for use of swallowing compensatory strategies. SLP Short Term Goal 2 (Week 1): Patient will express wants/needs via multimodal communication w/ max A verbal/visual cues SLP Short Term Goal 3 (Week 1): Patient will name familiar objects with 50% accuracy with max A multimodal cues SLP Short Term Goal 4 (Week 1): Patient will answer basic yes/no questions utilizing multimodal communication with 50% accuracy with max A multimodal cues SLP Short Term Goal 5 (Week 1): Patient will match object to word from a field of two-three with max A verbal/visual cues. SLP Short Term Goal 6 (Week 1): Patient will demonstrate sustained attention to functional cognitive-linguistic tasks for 10 minutes with mod A verbal/visual cues for redirection.  Skilled Therapeutic Interventions:   Patient seen for skilled ST session focusing on aphasia and swallow function goals. Patient consumed breakfast meal tray of puree solids and thin liquids without overt s/s aspiration or penetration and with full clearance of oral cavity post swallows. She was able to bring utensil to mouth but required assist with scooping food onto utensil. Patient named 9 of 10 objects, 5 of 9 verb/action photos without cues and 8 of 9 with cues. She was able to describe object function for common objects with modA cues and was correct on 9 of 11. Majority of verbalizations were cued and/or during structured tasks but she did spontaneously say "I got to pee" at end of session. Patient continues to benefit from skilled SLP intervention to maximize cognitive-linguistic  and swallow function prior to discharge.  Pain Pain Assessment Pain Scale: Faces Pain Score: 0-No pain Faces Pain Scale: No hurt  Therapy/Group: Individual Therapy  [name], MA, CCC-SLP Speech Therapy

## 2020-08-12 NOTE — Progress Notes (Signed)
Occupational Therapy Session Note  Patient Details  Name: Ellen Barajas MRN: [id number] Date of Birth: 05/05/1928  Today's Date: 08/12/2020 OT Individual Time: 5462-7035 OT Individual Time Calculation (min): 31 min    Short Term Goals: See Week 1 Goals for some details   Skilled Therapeutic Interventions/Progress Updates:    Pt up in the wheelchair to start session.  She was able to state "I'm fine." When therapist asked her how she was doing today, after several attempts to question.  She was taken over to the sink where she washed her face and combed her hair.  Max hand over hand assist with the LUE to initiate wetting the washcloth and for squeezing it out.  She was then able to bring it up to her face and wash with mod instructional and demonstrational cueing for thoroughness.  When presented with a brush, she was not able to state what the object was, but attempted to verbally after therapist questioned her a few times.  She was able to brush her hair with the LUE with min demonstrational cueing for initiation, but max hand over hand with attempting to use the RUE.  She needed therapist to physically remove the brush from her left hand as she would not attempt setting it down to just verbal cueing.  Finished session with pt in the wheelchair and with the call button and phone in reach and safety belt in place.    Therapy Documentation Precautions:  Precautions Precautions: Fall Precaution Comments: expressive aphasia, very hard of hearing (lost left hearing aid); left inattention; right pusher; right sided weakness/apraxia Restrictions Weight Bearing Restrictions: No  Pain: Pain Assessment Pain Scale: Faces Pain Score: 0-No pain Faces Pain Scale: No hurt ADL: See Care Plan Section for some details of mobility and selfcare   Therapy/Group: Individual Therapy  Jalesa Thien OTR/L 08/12/2020, 3:41 PM

## 2020-08-12 NOTE — Progress Notes (Signed)
PROGRESS NOTE   Subjective/Complaints: Per SLP identified 9/10 objects this am , still with sparse verbal output but was able to indicate she had to urinate   ROS: Limited due to cognitive/behavioral    Objective:   No results found. No results for input(s): WBC, HGB, HCT, PLT in the last 72 hours.  No results for input(s): NA, K, CL, CO2, GLUCOSE, BUN, CREATININE, CALCIUM in the last 72 hours.   Intake/Output Summary (Last 24 hours) at 08/12/2020 0854 Last data filed at 08/12/2020 0816 Gross per 24 hour  Intake 480 ml  Output --  Net 480 ml         Physical Exam: Vital Signs Blood pressure (!) 153/76, pulse 68, temperature 98.4 F (36.9 C), temperature source Oral, resp. rate 18, weight 65.6 kg, SpO2 99 %.  General: No acute distress Mood and affect are appropriate Heart: Regular rate and rhythm no rubs murmurs or extra sounds Lungs: Clear to auscultation, breathing unlabored, no rales or wheezes Abdomen: Positive bowel sounds, soft nontender to palpation, nondistended Extremities: No clubbing, cyanosis, or edema Skin: No evidence of breakdown, no evidence of rash   Neuro: aphasic- nodded yes and no to simple commands/questions- verbalized today mostly garble. Did answer one question appropriately. Followed a few simple commands. Still very apraxic. Alert. Left sided neglect as well as probable field cut  moves left side automatically, less to command. Grips right hand firmly 4/5 strength. But with increase UE flexor tone and persistent grasp, RLE 3-/5 with increased extensor tone, LUE and LLE 4/5  Assessment/Plan: 1. Functional deficits which require 3+ hours per day of interdisciplinary therapy in a comprehensive inpatient rehab setting. Physiatrist is providing close team supervision and 24 hour management of active medical problems listed below. Physiatrist and rehab team continue to assess barriers to  discharge/monitor patient progress toward functional and medical goals  Care Tool:  Bathing    Body parts bathed by patient: Chest, Abdomen, Right upper leg, Left upper leg, Face   Body parts bathed by helper: Front perineal area     Bathing assist Assist Level: Maximal Assistance - Patient 24 - 49%     Upper Body Dressing/Undressing Upper body dressing   What is the patient wearing?: Bra, Pull over shirt    Upper body assist Assist Level: Maximal Assistance - Patient 25 - 49%    Lower Body Dressing/Undressing Lower body dressing      What is the patient wearing?: Pants     Lower body assist Assist for lower body dressing: Total Assistance - Patient < 25%     Toileting Toileting    Toileting assist Assist for toileting: Total Assistance - Patient < 25%     Transfers Chair/bed transfer  Transfers assist     Chair/bed transfer assist level: Maximal Assistance - Patient 25 - 49%     Locomotion Ambulation   Ambulation assist      Assist level: Maximal Assistance - Patient 25 - 49% Assistive device: Walker-rolling Max distance: 10'   Walk 10 feet activity   Assist  Walk 10 feet activity did not occur: Safety/medical concerns  Assist level: 2 helpers Assistive device: Hand held assist  Walk 50 feet activity   Assist Walk 50 feet with 2 turns activity did not occur: Safety/medical concerns  Assist level: 2 helpers Assistive device: Hand held assist    Walk 150 feet activity   Assist Walk 150 feet activity did not occur: Safety/medical concerns         Walk 10 feet on uneven surface  activity   Assist Walk 10 feet on uneven surfaces activity did not occur: Safety/medical concerns         Wheelchair     Assist Will patient use wheelchair at discharge?: No   Wheelchair activity did not occur: N/A         Wheelchair 50 feet with 2 turns activity    Assist    Wheelchair 50 feet with 2 turns activity did not occur:  N/A       Wheelchair 150 feet activity     Assist  Wheelchair 150 feet activity did not occur: N/A      BP (!) 153/76 (BP Location: Left Arm)   Pulse 68   Temp 98.4 F (36.9 C) (Oral)   Resp 18   Wt 65.6 kg   SpO2 99%   BMI 25.62 kg/m   Medical Problem List and Plan: 1.  Right side weakness with mild receptive aphasia/dysarthria secondary to moderate-sized left ACA infarction 7/9 and Right PCA infarct 7/14             -patient may  shower             -ELOS/Goals: 2-3 wks MinA  -Continue CIR therapies including PT, OT, and SLP   -please call niece [name] with any changes in status  7/14 new right PCA infarct- discussed with neurology- no change in management.   -palliative care consult placed- planning home hospice if pt discharges to home  2.  Antithrombotics: -DVT/anticoagulation: Xarelto             -antiplatelet therapy: Plavix 75 mg daily 3. Pain Management: Tylenol as needed 4. Mood: Provide emotional support             -antipsychotic agents: N/A 5. Neuropsych: This patient is NOT capable of making decisions on her own behalf. 6. Skin/Wound Care: Routine skin checks 7. Fluids/Electrolytes/Nutrition: Routine in and outs with follow-up chemistries. Labs reviewed and stable.  8.  Chronic atrial fibrillation.  Continue Xarelto.  Follow-up cardiology services with daily EKGs: stable with metoprolol 50mg  BID.  9.  Hypertension/atrial fibrillation.  Continue Cozaar 25 mg daily, Lopressor 37.5 mg twice daily. BP elevated to  increase Lopressor to 75mg  BID on 7/19 monitor effect  Vitals:   08/11/20 2019 08/12/20 0437  BP: (!) 170/79 (!) 153/76  Pulse: 79 68  Resp: 18 18  Temp: 98 F (36.7 C) 98.4 F (36.9 C)  SpO2: 93% 99%    10.  Chronic systolic congestive heart failure.  Lasix 20 mg daily.  Monitor for any signs of fluid overload  7/15- will order daily weights   Filed Weights   08/08/20 0500 08/11/20 0500 08/11/20 0633  Weight: 65.6 kg 65.6 kg 65.6 kg     -7/18 need recorded weights  11.  CAD with history of CABG. follow-up cardiology services 12.  Hyperlipidemia.  Lipitor 13.  Multiple facial fractures.  Follow-up outpatient DR 08/13/20.  Placed on empiric Keflex x7 days 14. Bowel and Bladder incont, likely cognitive/awareness, timed toileting- discussed with niece 68. Hard of hearing: please use masks with transparent piece over lips when possible-  discussed with therapy and niece. Use hearing aides.  16. Decreased initiation  Amantadine started 7/16. monitor trial -continue on current dose of 100mg  daily   LOS: 7 days A FACE TO FACE EVALUATION WAS PERFORMED  08/12/2020, 8:54 AM

## 2020-08-13 NOTE — Progress Notes (Signed)
PROGRESS NOTE   Subjective/Complaints: Improving speech, looking to left!  ROS: Limited due to cognitive/behavioral    Objective:   No results found. No results for input(s): WBC, HGB, HCT, PLT in the last 72 hours.  No results for input(s): NA, K, CL, CO2, GLUCOSE, BUN, CREATININE, CALCIUM in the last 72 hours.   Intake/Output Summary (Last 24 hours) at 08/13/2020 1107 Last data filed at 08/13/2020 0807 Gross per 24 hour  Intake 300 ml  Output --  Net 300 ml        Physical Exam: Vital Signs Blood pressure (!) 158/76, pulse 80, temperature 97.9 F (36.6 C), temperature source Oral, resp. rate 18, weight 65.6 kg, SpO2 99 %. Gen: no distress, normal appearing HEENT: oral mucosa pink and moist, NCAT Cardio: Reg rate Chest: normal effort, normal rate of breathing Abd: soft, non-distended Ext: no edema Psych: pleasant, normal affect Skin: intact  Neuro: aphasic- nodded yes and no to simple commands/questions- verbalized today mostly garble. Did answer one question appropriately. Followed a few simple commands. Still very apraxic. Alert. Left sided neglect as well as probable field cut  moves left side automatically, less to command. Grips right hand firmly 4/5 strength. But with increase UE flexor tone and persistent grasp, RLE 3-/5 with increased extensor tone, LUE and LLE 4/5  Assessment/Plan: 1. Functional deficits which require 3+ hours per day of interdisciplinary therapy in a comprehensive inpatient rehab setting. Physiatrist is providing close team supervision and 24 hour management of active medical problems listed below. Physiatrist and rehab team continue to assess barriers to discharge/monitor patient progress toward functional and medical goals  Care Tool:  Bathing    Body parts bathed by patient: Face   Body parts bathed by helper: Front perineal area Body parts n/a: Right arm, Left arm, Chest,  Abdomen, Front perineal area, Buttocks, Right upper leg, Left upper leg, Left lower leg, Right lower leg (did not attempt)   Bathing assist Assist Level: Maximal Assistance - Patient 24 - 49%     Upper Body Dressing/Undressing Upper body dressing   What is the patient wearing?: Bra, Pull over shirt    Upper body assist Assist Level: Maximal Assistance - Patient 25 - 49%    Lower Body Dressing/Undressing Lower body dressing      What is the patient wearing?: Pants     Lower body assist Assist for lower body dressing: Total Assistance - Patient < 25%     Toileting Toileting    Toileting assist Assist for toileting: Total Assistance - Patient < 25%     Transfers Chair/bed transfer  Transfers assist     Chair/bed transfer assist level: Maximal Assistance - Patient 25 - 49%     Locomotion Ambulation   Ambulation assist      Assist level: Maximal Assistance - Patient 25 - 49% Assistive device: Walker-rolling Max distance: 10'   Walk 10 feet activity   Assist  Walk 10 feet activity did not occur: Safety/medical concerns  Assist level: 2 helpers Assistive device: Hand held assist   Walk 50 feet activity   Assist Walk 50 feet with 2 turns activity did not occur: Safety/medical concerns  Assist  level: 2 helpers Assistive device: Hand held assist    Walk 150 feet activity   Assist Walk 150 feet activity did not occur: Safety/medical concerns         Walk 10 feet on uneven surface  activity   Assist Walk 10 feet on uneven surfaces activity did not occur: Safety/medical concerns         Wheelchair     Assist Will patient use wheelchair at discharge?: No   Wheelchair activity did not occur: N/A         Wheelchair 50 feet with 2 turns activity    Assist    Wheelchair 50 feet with 2 turns activity did not occur: N/A       Wheelchair 150 feet activity     Assist  Wheelchair 150 feet activity did not occur: N/A       BP (!) 158/76 (BP Location: Left Arm)   Pulse 80   Temp 97.9 F (36.6 C) (Oral)   Resp 18   Wt 65.6 kg   SpO2 99%   BMI 25.62 kg/m   Medical Problem List and Plan: 1.  Right side weakness with mild receptive aphasia/dysarthria secondary to moderate-sized left ACA infarction 7/9 and Right PCA infarct 7/14             -patient may  shower             -ELOS/Goals: 2-3 wks MinA  -Continue CIR therapies including PT, OT, and SLP   -please call niece [name] with any changes in status  7/14 new right PCA infarct- discussed with neurology- no change in management.   -palliative care consult placed- planning home hospice if pt discharges to home  2.  Antithrombotics: -DVT/anticoagulation: Xarelto             -antiplatelet therapy: Plavix 75 mg daily 3. Pain Management: Tylenol as needed 4. Mood: Provide emotional support             -antipsychotic agents: N/A 5. Neuropsych: This patient is NOT capable of making decisions on her own behalf. 6. Skin/Wound Care: Routine skin checks 7. Fluids/Electrolytes/Nutrition: Routine in and outs with follow-up chemistries. Labs reviewed and stable.  8.  Chronic atrial fibrillation.  Continue Xarelto.  Follow-up cardiology services with daily EKGs: stable with metoprolol 50mg  BID.  9.  Hypertension/atrial fibrillation.  Continue Cozaar 25 mg daily, Lopressor 37.5 mg twice daily. BP elevated to  increase Lopressor to 75mg  BID on 7/19 monitor effect. Labile- continue current regimen.  Vitals:   08/12/20 2008 08/13/20 0517  BP: 116/83 (!) 158/76  Pulse: (!) 56 80  Resp: 18 18  Temp: 97.8 F (36.6 C) 97.9 F (36.6 C)  SpO2: 97% 99%    10.  Chronic systolic congestive heart failure.  Continue Lasix 20 mg daily.  Monitor for any signs of fluid overload   Filed Weights   08/11/20 0500 08/11/20 0633 08/12/20 2008  Weight: 65.6 kg 65.6 kg 65.6 kg   11.  CAD with history of CABG. follow-up cardiology services 12.  Hyperlipidemia.  Continue Lipitor 13.   Multiple facial fractures.  Follow-up outpatient DR 08/13/20.  Placed on empiric Keflex x7 days 14. Bowel and Bladder incont, likely cognitive/awareness, timed toileting- discussed with niece 70. Hard of hearing: please use masks with transparent piece over lips when possible- discussed with therapy and niece. Use hearing aides.  16. Decreased initiation  Amantadine started 7/16. monitor trial -continue on current dose of 100mg  daily  LOS:  8 days A FACE TO FACE EVALUATION WAS PERFORMED  Clint Bolder P Emannuel Vise 08/13/2020, 11:07 AM

## 2020-08-13 NOTE — Progress Notes (Signed)
Speech Language Pathology Weekly Progress and Session Note  Patient Details  Name: Ellen Barajas MRN: [id number] Date of Birth: 05-12-1928  Beginning of progress report period: 08/06/2020 End of progress report period: 08/13/2020  Today's Date: 08/13/2020 SLP Individual Time: 7948-0165 SLP Individual Time Calculation (min): 45 min  Short Term Goals: Week 1: SLP Short Term Goal 1 (Week 1): Patient will tolerate Dys 1 diet and thin liquids without overt s/sx of aspiration with mod A verbal cues for use of swallowing compensatory strategies. SLP Short Term Goal 1 - Progress (Week 1): Met SLP Short Term Goal 2 (Week 1): Patient will express wants/needs via multimodal communication w/ max A verbal/visual cues SLP Short Term Goal 2 - Progress (Week 1): Met SLP Short Term Goal 3 (Week 1): Patient will name familiar objects with 50% accuracy with max A multimodal cues SLP Short Term Goal 3 - Progress (Week 1): Met SLP Short Term Goal 4 (Week 1): Patient will answer basic yes/no questions utilizing multimodal communication with 50% accuracy with max A multimodal cues SLP Short Term Goal 4 - Progress (Week 1): Met SLP Short Term Goal 5 (Week 1): Patient will match object to word from a field of two-three with max A verbal/visual cues. SLP Short Term Goal 5 - Progress (Week 1): Met SLP Short Term Goal 6 (Week 1): Patient will demonstrate sustained attention to functional cognitive-linguistic tasks for 10 minutes with mod A verbal/visual cues for redirection. SLP Short Term Goal 6 - Progress (Week 1): Progressing toward goal    New Short Term Goals: Week 2: SLP Short Term Goal 1 (Week 2): STG=LTG's (ELOS 7/28)  Weekly Progress Updates:  Patient has made good progress and met 5/6 STG's and is progressing towards other goal. She continues to require modA, at times mod-maxA cues to initiate actions, verbal responses, etc. She is able to name common objects and object photos, describe basic object function  and describe common action/verb pictures and photos. She will at times spontaneously request at phrase level.   Intensity: Minumum of 1-2 x/day, 30 to 90 minutes Frequency: 3 to 5 out of 7 days Duration/Length of Stay: 7/28 Treatment/Interventions: Cognitive remediation/compensation;Environmental controls;Internal/external aids;Multimodal communication approach;Speech/Language facilitation;Cueing hierarchy;Dysphagia/aspiration precaution training;Functional tasks;Patient/family education;Therapeutic Activities   Daily Session  Skilled Therapeutic Interventions: Patient seen for skilled ST session with focus on aphasia goals. Patient able to name 8 of 10 object photos but required mod-maxA to initiate and to visually focus on photos. At one point during session she was looking blankly and not responding and when SLP asked her if she was ok, she then responded, "Im starting to have a bowel movement". She was able to identify alphabet letters for 2/5, was not able to identify misspelling of her first name, was not able to identify printed shapes and when reading single words, was exhibiting a left sided visual neglect. Patient continues to benefit from skilled SLP intervention to maximize speech-language, cognitive and swallow function goals prior to discharge.     General    Pain Pain Assessment Pain Scale: Faces Pain Score: 0-No pain Faces Pain Scale: No hurt  Therapy/Group: Individual Therapy  Sonia Baller, MA, CCC-SLP Speech Therapy

## 2020-08-13 NOTE — Progress Notes (Signed)
[employer], [employer] [facility]) Hospital Liaison: RN note     Notified by Transition of Care Manger of patient/family request for [facility] services at home after discharge. Chart and patient information under review by [facility] physician. Hospice eligibility pending currently.     [facility] Liaisons will reach out to pt and family on Friday to initiate education on hospice services.  Please do not hesitate to call with questions.   Thank you for the opportunity to participate in this patient's care.  Gillian Scarce, BSN, RN       [facility] Liaison (listed on [facility] under Hospice /[phone]  [phone] (24h on call)

## 2020-08-13 NOTE — Progress Notes (Signed)
Patient ID: Ellen Barajas, female   DOB: 1928/09/29, 87 y.o.   MRN: [id number] [facility] referral made will await contact by hospital liaison and will give niece-[name] information.

## 2020-08-13 NOTE — Progress Notes (Signed)
Daily Progress Note   Patient Name: Ellen Barajas       Date: 08/13/2020 DOB: [birth year]  Age: 87 y.o. MRN#: [id number] Attending Physician: Horton Chin, MD Primary Care Physician: No primary care provider on file. Admit Date: 08/05/2020  Reason for Consultation/Follow-up: goals of care  Subjective: I spoke niece [name] by phone. She tells me discharge date may be next Tuesday 7/26. She states she has not heard from hospice yet, and would like to start getting things in order for Ellen Barajas to come home. I let her know I would reach out to the hospice liaison and have them call her today.  I spoke with hospice liaison Misty, and she stated someone would be contacting [name] in the next day or so.    Length of Stay: 8  Current Medications: Scheduled Meds:   amantadine  100 mg Oral Daily   atorvastatin  40 mg Oral Daily   clopidogrel  75 mg Oral Daily   escitalopram  5 mg Oral QHS   furosemide  20 mg Oral Daily   losartan  25 mg Oral Daily   metoprolol tartrate  75 mg Oral BID   Rivaroxaban  15 mg Oral Q supper      PRN Meds: acetaminophen, albuterol, bisacodyl, hydrALAZINE, polyethylene glycol        Vital Signs: BP (!) 158/76 (BP Location: Left Arm)   Pulse 80   Temp 97.9 F (36.6 C) (Oral)   Resp 18   Wt 65.6 kg   SpO2 99%   BMI 25.62 kg/m  SpO2: SpO2: 99 % O2 Device: O2 Device: Room Air O2 Flow Rate:    LBM: Last BM Date: 08/13/20 Baseline Weight: Weight: 65.6 kg Most recent weight: Weight: 65.6 kg       Palliative Assessment/Data: PPS 50%      Palliative Care Assessment & Plan   HPI/Patient Profile: 87 y.o. female  with past medical history of atrial fibrillation on Xarelto, CAD with CABG in 2002, hypertension, hyperlipidemia, and history of hip  fracture who initially presented to the emergency department on 08/05/2020 with fall and altered mental status. She was admitted with NSTEMI Imaging also revealed multiple facial fractures, for which ENT recommended supportive care and anibiotics. Initial head CT was negative for acute findings, but MRI brain on 7/9  revealed a moderate to large acute left ACA infarct.  Neurology was consulted.  Cardiology was consulted and recommended a TTE which showed an EF of 25% to 30%.  She was started on diuresis and resumed on her anticoagulation in addition to Plavix.  She was deemed medically stable to be discharged to CIR on 7/13.    Due to mental status changes overnight 7/13-7/14, patient was evaluated for possible new infarct. MRI did reveal new right PCA territory infarct, plus scattered small acute bilateral Cerebellar infarcts.   Assessment: - acute CVA - NSTEMI - newly diagnosed systolic heart failure - persistent atrial fibrillation - multiple facial fractures - right side weakness - aphasia  Recommendations/Plan: DNR/DNI as previously documented Continue current care and therapy to improve functional status Plan is for home with hospice ([facility]) upon discharge from CIR Continue escitalopram (Lexapro) 5 mg at bedtime for mood PMT will continue to follow  Goals of Care and Additional Recommendations: Limitations on Scope of Treatment: No Artificial Feeding  Code Status:  Prognosis:  < 6 months  Discharge Planning: Home with Hospice   Thank you for allowing the Palliative Medicine Team to assist in the care of this patient.   Total Time 15 minutes Prolonged Time Billed  no       Greater than 50%  of this time was spent counseling and coordinating care related to the above assessment and plan.  [name], NP  Please contact Palliative Medicine Team phone at [phone] for questions and concerns.

## 2020-08-13 NOTE — Progress Notes (Signed)
Physical Therapy Session Note  Patient Details  Name: Ellen Barajas MRN: [id number] Date of Birth: 25-Mar-1928  Today's Date: 08/13/2020 PT Individual Time: 0930-1010 PT Individual Time Calculation (min): 40 min   Short Term Goals: Week 1:  PT Short Term Goal 1 (Week 1): Pt will complete bed mobility with modA PT Short Term Goal 2 (Week 1): Pt will complete bed<>chair transfers wtih modA and LRAD PT Short Term Goal 3 (Week 1): Pt will ambulate 23ft with modA and LRAD  Skilled Therapeutic Interventions/Progress Updates:     Pt greeted supine in bed to start session. RN at bedside completing routine neuro exam. Pt appears agreeable to PT session. [name] mostly nonverbal other than a few "yes" and "no" during session. She also denies pain but reports "I feel like I need to have a bowel movement soon." RN reports she just had a BM and they assisted cleaning her up. Retrieved BSC and bed mobility completed with maxA for initiation, trunk and BLE management. Requires minA for sitting balance due to R lean. Completed stand<>pivot transfer with mod/maxA from EOB to [facility] and RN provided +2 assist for managing pants/brief. Pt was unable to produce BM but was continent of bladder. She was able to sit unsupported on [facility] with supervision. Completed sit<>stand with modA and required modA for standing balance while pulling pants/brief over hips - posterior bias in standing noted. Completed stand<>pivot transfer with mod/maxA back to EOB and cleaned out [facility] and retrieved w/c. Completed stand<>pivot with mod/maxA with increased cues and tactile cues for facilitating RLE stepping response but limited initiation noted. Wheeled sinkside to complete morning routine such as oral care and washing face. She required hand-over-hand assist to LUE for initiating all tasks and motor apraxia present. Pt with difficulty understanding simple instructions and often stares blankly in response. She was assisted back to bed at end of  session via stand pivot with modA - improved ability to turn and participate in transfer. Required maxA for sit>supine for trunk and BLE management. Remained supine in bed with HOB raised and bed alarm on, all needs in reach.  Therapy Documentation Precautions:  Precautions Precautions: Fall Precaution Comments: expressive aphasia, very hard of hearing (lost left hearing aid); left inattention; right pusher; right sided weakness/apraxia Restrictions Weight Bearing Restrictions: No General:    Therapy/Group: Individual Therapy  Orrin Brigham 08/13/2020, 6:37 AM

## 2020-08-14 NOTE — Progress Notes (Signed)
Occupational Therapy Session Note  Patient Details  Name: Ellen Barajas MRN: [id number] Date of Birth: 05-30-1928  Today's Date: 08/14/2020 OT Individual Time:1400-1440    OT Individual Time Calculation (min): 40 min    Short Term Goals: Week 1:  OT Short Term Goal 1 (Week 1): Pt will complete UB dressing with mod assist using hemi techniques OT Short Term Goal 2 (Week 1): Pt will complete sit<>stand with min assist in preperation for LB dressing and bathing. OT Short Term Goal 3 (Week 1): Pt will scan to left visual field to retrieve items from sink with mod cues. OT Short Term Goal 4 (Week 1): Pt will complete toilet transfer with mod assist.    Skilled Therapeutic Interventions/Progress Updates:    Pt semi upright in bed, agreeable to sit EOB with OT.  Stating "no" when asked if she needed to toilet.  Pt required max multimodal cues and total assist right roll and sidelying to sit.  Pt sat EOB with CGA and three occasions of min assist to right to midline.  Pt participated in visual scanning and functional grasp and release using LUE to pick up clothes pins from therapists hand and place in clear container.  Pt needed clothespin and container placed approximately 60 degrees from midline on right side in order to attend.  When clothespin placed at midline pt reaching for but unable to accurately locate and grasp.  Pt requesting back to bed and required total assist sit to supine and repositioning on right side to allow for increased proprioception through RLE/RUE and redistribute pressure to protect skin integrity with pillows to support in neutral. Call bell in reach, bed alarm on.  Pt more participative today during EOB theract.  Therapy Documentation Precautions:  Precautions Precautions: Fall Precaution Comments: expressive aphasia, very hard of hearing (lost left hearing aid); left inattention; right pusher; right sided weakness/apraxia Restrictions Weight Bearing Restrictions:  No    Therapy/Group: Individual Therapy  Amie Critchley 08/14/2020, 3:53 PM

## 2020-08-14 NOTE — Progress Notes (Signed)
Speech Language Pathology Daily Session Note  Patient Details  Name: Ellen Barajas MRN: [id number] Date of Birth: 1928/09/01  Today's Date: 08/14/2020 SLP Individual Time: 1230-1250 SLP Individual Time Calculation (min): 20 min  Short Term Goals: Week 2: SLP Short Term Goal 1 (Week 2): STG=LTG's (ELOS 7/28)  Skilled Therapeutic Interventions:   Patient seen for skilled ST session focusing on dysphagia goals with SLP observing patient's toleration of trial upgrade tray of Dys 2 (currently she is on Dys 1). Patient's overall intake was approximately 6-8 bites, however she exhibited only mild delays in mastication and oral transit and did not exhibit any oral residuals post swallows. After accepting bites (patient did not initiate any self-feeding or attempts to do so), she started closing mouth and shaking head slightly. As she appears to have tolerated this upgraded solid texture, SLP is recommending to continue with Dys 2 solids.   Pain Pain Assessment Pain Scale: Faces Faces Pain Scale: No hurt  Therapy/Group: Individual Therapy  [name], MA, CCC-SLP Speech Therapy

## 2020-08-14 NOTE — Progress Notes (Signed)
PROGRESS NOTE   Subjective/Complaints:  Pt said "OK"- doesn't remember when had LBM.  LBM x2 yesterday.  Said "no" when asked if anything else causing issues.    ROS: limited due to cognition/behavioral    Objective:   No results found. No results for input(s): WBC, HGB, HCT, PLT in the last 72 hours.  No results for input(s): NA, K, CL, CO2, GLUCOSE, BUN, CREATININE, CALCIUM in the last 72 hours.   Intake/Output Summary (Last 24 hours) at 08/14/2020 0923 Last data filed at 08/14/2020 2620 Gross per 24 hour  Intake 780 ml  Output --  Net 780 ml        Physical Exam: Vital Signs Blood pressure (!) 162/79, pulse 71, temperature 98.2 F (36.8 C), resp. rate 20, height 5\' 3"  (1.6 m), weight 65.7 kg, SpO2 97 %.  General: awake, alert, appropriate, staring at me; but more interactive- spoke slightly more; NAD HENT: conjugate gaze; oropharynx moist- ruising on R side of face more yellow/healing CV: regular rate; no JVD Pulmonary: CTA B/L; no W/R/R- good air movement GI: soft, NT, ND, (+)BS Psychiatric: very flat, but did speaks a couple of words today, which is new for me; also good volume Neurological: more alert   Neuro: aphasic- nodded yes and no to simple commands/questions- verbalized today mostly garble. Did answer one question appropriately. Followed a few simple commands. Still very apraxic. Alert. Left sided neglect as well as probable field cut  moves left side automatically, less to command. Grips right hand firmly 4/5 strength. But with increase UE flexor tone and persistent grasp, RLE 3-/5 with increased extensor tone, LUE and LLE 4/5  Assessment/Plan: 1. Functional deficits which require 3+ hours per day of interdisciplinary therapy in a comprehensive inpatient rehab setting. Physiatrist is providing close team supervision and 24 hour management of active medical problems listed below. Physiatrist and  rehab team continue to assess barriers to discharge/monitor patient progress toward functional and medical goals  Care Tool:  Bathing    Body parts bathed by patient: Face   Body parts bathed by helper: Front perineal area Body parts n/a: Right arm, Left arm, Chest, Abdomen, Front perineal area, Buttocks, Right upper leg, Left upper leg, Left lower leg, Right lower leg (did not attempt)   Bathing assist Assist Level: Maximal Assistance - Patient 24 - 49%     Upper Body Dressing/Undressing Upper body dressing   What is the patient wearing?: Bra, Pull over shirt    Upper body assist Assist Level: Maximal Assistance - Patient 25 - 49%    Lower Body Dressing/Undressing Lower body dressing      What is the patient wearing?: Pants     Lower body assist Assist for lower body dressing: Total Assistance - Patient < 25%     Toileting Toileting    Toileting assist Assist for toileting: Total Assistance - Patient < 25%     Transfers Chair/bed transfer  Transfers assist     Chair/bed transfer assist level: Maximal Assistance - Patient 25 - 49%     Locomotion Ambulation   Ambulation assist      Assist level: Maximal Assistance - Patient 25 - 49% Assistive  device: Walker-rolling Max distance: 10'   Walk 10 feet activity   Assist  Walk 10 feet activity did not occur: Safety/medical concerns  Assist level: 2 helpers Assistive device: Hand held assist   Walk 50 feet activity   Assist Walk 50 feet with 2 turns activity did not occur: Safety/medical concerns  Assist level: 2 helpers Assistive device: Hand held assist    Walk 150 feet activity   Assist Walk 150 feet activity did not occur: Safety/medical concerns         Walk 10 feet on uneven surface  activity   Assist Walk 10 feet on uneven surfaces activity did not occur: Safety/medical concerns         Wheelchair     Assist Will patient use wheelchair at discharge?: No   Wheelchair  activity did not occur: N/A         Wheelchair 50 feet with 2 turns activity    Assist    Wheelchair 50 feet with 2 turns activity did not occur: N/A       Wheelchair 150 feet activity     Assist  Wheelchair 150 feet activity did not occur: N/A      BP (!) 162/79 (BP Location: Right Arm)   Pulse 71   Temp 98.2 F (36.8 C)   Resp 20   Ht 5\' 3"  (1.6 m)   Wt 65.7 kg   SpO2 97%   BMI 25.66 kg/m   Medical Problem List and Plan: 1.  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   -palliative care consult placed- planning home hospice if pt discharges to home   -con't PT, OT and SLP- hospice planned 2.  Antithrombotics: -DVT/anticoagulation: Xarelto             -antiplatelet therapy: Plavix 75 mg daily 3. Pain Management: Tylenol as needed 4. Mood: Provide emotional support             -antipsychotic agents: N/A 5. Neuropsych: This patient is NOT capable of making decisions on her own behalf. 6. Skin/Wound Care: Routine skin checks 7. Fluids/Electrolytes/Nutrition: Routine in and outs with follow-up chemistries. Labs reviewed and stable.  8.  Chronic atrial fibrillation.  Continue Xarelto.  Follow-up cardiology services with daily EKGs: stable with metoprolol 50mg  BID.  9.  Hypertension/atrial fibrillation.  Continue Cozaar 25 mg daily, Lopressor 37.5 mg twice daily. BP elevated to  increase Lopressor to 75mg  BID on 7/19 monitor effect. Labile- continue current regimen.   7/22- BP labile, but overall controlled- con't regimen Vitals:   08/13/20 1931 08/14/20 0419  BP: (!) 126/52 (!) 162/79  Pulse: 70 71  Resp: 20 20   Temp: 98 F (36.7 C) 98.2 F (36.8 C)  SpO2: 96% 97%    10.  Chronic systolic congestive heart failure.  Continue Lasix 20 mg daily.  Monitor for any signs of fluid overload   Filed Weights   08/11/20 0633 08/12/20 2008 08/13/20 2038  Weight: 65.6 kg 65.6 kg 65.7 kg   11.  CAD with history of  CABG. follow-up cardiology services 12.  Hyperlipidemia.  Continue Lipitor 13.  Multiple facial fractures.  Follow-up outpatient DR Elenore Rota.  Placed on empiric Keflex x7 days 14. Bowel and Bladder incont, likely cognitive/awareness, timed toileting- discussed with niece 25. Hard of hearing: please use masks with transparent piece over lips when possible- discussed with therapy and niece. Use hearing aides.  16. Decreased initiation  Amantadine started 7/16. monitor trial -continue on current dose of 100mg  daily  7/22- is more awake/more interactive then before the Amantadine, so will con't for now.   LOS: 9 days A FACE TO FACE EVALUATION WAS PERFORMED  Lorren Rossetti 08/14/2020, 9:23 AM

## 2020-08-14 NOTE — Progress Notes (Signed)
Occupational Therapy Weekly Progress Note  Patient Details  Name: Ellen Barajas MRN: [id number] Date of Birth: [birth year]  Beginning of progress report period: August 06, 2020 End of progress report period: August 14, 2020  Today's Date: 08/14/2020 OT Individual Time: 1000-1100 OT Individual Time Calculation (min): 60 min    Patient has met 0 of 4 short term goals.  Pt making very slow progress with barriers including incontinence of bowel and bladder, apathy towards participation, very limited activity tolerance, and significant left visual field cut/left inattention, and right sided neglect which impacts her independence during ADLs.  Pt does benefit from providing cues in extreme right visual field and noted improved performance and participation today with this considered.  Pt requires total assist to max assist for all self care.  Her static sitting balance is good but requires max assist for all functional transfers.  Per note from palliative, plan to discharge with hospice home with niece.    Patient continues to demonstrate the following deficits: muscle weakness, decreased cardiorespiratoy endurance, impaired timing and sequencing, abnormal tone, unbalanced muscle activation, motor apraxia, decreased coordination, and decreased motor planning, decreased visual perceptual skills, decreased visual motor skills, and field cut, decreased midline orientation, decreased attention to left, right side neglect, decreased motor planning, and ideational apraxia, decreased initiation, decreased attention, decreased awareness, decreased problem solving, decreased safety awareness, decreased memory, and delayed processing, and decreased sitting balance, decreased standing balance, decreased postural control, hemiplegia, and decreased balance strategies and therefore will continue to benefit from skilled OT intervention to enhance overall performance with BADL.  Patient not progressing toward long term goals.   See goal revision..  Continue plan of care.  OT Short Term Goals Week 1:  OT Short Term Goal 1 (Week 1): Pt will complete UB dressing with mod assist using hemi techniques OT Short Term Goal 1 - Progress (Week 1): Not met OT Short Term Goal 2 (Week 1): Pt will complete sit<>stand with min assist in preperation for LB dressing and bathing. OT Short Term Goal 2 - Progress (Week 1): Not met OT Short Term Goal 3 (Week 1): Pt will scan to left visual field to retrieve items from sink with mod cues. OT Short Term Goal 3 - Progress (Week 1): Not met OT Short Term Goal 4 (Week 1): Pt will complete toilet transfer with mod assist. OT Short Term Goal 4 - Progress (Week 1): Not met  Skilled Therapeutic Interventions/Progress Updates:    Pt semi upright in bed, reports no pain.  Pt stating "yes" when asked if she wanted to get dressed and wash up.  Pt required total assist at bed level to doff brief which was soiled of bowel and complete pericare and donn clean brief.  Pt able to hold self using bed rail (with max multimodal cues to initiate) into right sided roll to assist during bed level toileting. OT noted pt less responsive and participative when providing cues on left side/midline relation to pt therefore all cues provided with therapist positioned directly to the right of pt and noted significantly increased responses and improved participation and command following.  Pt setup with washcloth and she was able to wash partial upper right thigh using left hand with max cues.  Pt required total assist overall to bathe LB.  Donned pants over feet and pulled over hips with total assist however pt was able to actively flex both knees with max cueing and push up into bridge to assist.  Noted redness bilateral  heels; nurse made aware and recommendation made for Prevalons when in bed. Pt required total assist to roll to left, then mod assist sidelying to sit EOB.  Pt doffed shirt with max assist and donned clean shirt  with total assist.  Pt exhibited some emergent awareness of deficits stating "I can, off.. its hard putting on" indicating that she is having more difficulty when putting shirt on.  Pt stating yes when asked if wanting to return to bed.  Total assist sit to supine and positioning pt in right sidelying to increase proprioceptive input to right side and facilitate pt's gaze to midline/left, with pillows to support in neutral.  Call bell in reach, bed alarm on.  Therapy Documentation Precautions:  Precautions Precautions: Fall Precaution Comments: expressive aphasia, very hard of hearing (lost left hearing aid); left inattention; right pusher; right sided weakness/apraxia Restrictions Weight Bearing Restrictions: No   Therapy/Group: Individual Therapy  Ezekiel Slocumb 08/14/2020, 4:45 PM

## 2020-08-14 NOTE — Progress Notes (Signed)
Physical Therapy Weekly Progress Note  Patient Details  Name: Ellen Barajas MRN: [id number] Date of Birth: Nov 22, 1928  Beginning of progress report period: August 06, 2020 End of progress report period: August 14, 2020  Today's Date: 08/14/2020 PT Individual Time: 1400-1425 PT Individual Time Calculation (min): 25 min   Patient has met 0 of 3 short term goals. Pt is making very slow progress towards her LTG during her rehab stay. Limited progress 2/2 poor participation, incontinence of bowel and bladder, decreased activity tolerance with significant deficits from her CVA. Her frequency has been decreased to 15/7 to accommodate for this. She continues to require maxA for bed mobility, maxA for stand<>pivot transfers, and +2 assist for any functional gait training (has ambulated ~3f in // bars). Per note from palliative care, plan moving forward is to return home with daughter with hospice services covering.   Patient continues to demonstrate the following deficits muscle weakness, decreased cardiorespiratoy endurance, impaired timing and sequencing, abnormal tone, unbalanced muscle activation, motor apraxia, decreased coordination, and decreased motor planning, decreased visual perceptual skills and field cut, decreased midline orientation, decreased attention to left, right side neglect, and decreased motor planning, decreased initiation, decreased attention, decreased awareness, decreased problem solving, decreased safety awareness, decreased memory, and delayed processing, and decreased sitting balance, decreased standing balance, decreased postural control, hemiplegia, and decreased balance strategies and therefore will continue to benefit from skilled PT intervention to increase functional independence with mobility.  Patient progressing toward long term goals..  Continue plan of care.  PT Short Term Goals Week 1:  PT Short Term Goal 1 (Week 1): Pt will complete bed mobility with modA PT Short Term  Goal 1 - Progress (Week 1): Not met PT Short Term Goal 2 (Week 1): Pt will complete bed<>chair transfers wtih modA and LRAD PT Short Term Goal 2 - Progress (Week 1): Not met PT Short Term Goal 3 (Week 1): Pt will ambulate 273fwith modA and LRAD PT Short Term Goal 3 - Progress (Week 1): Not met Week 2:  PT Short Term Goal 1 (Week 2): Pt will demonstrate improved ability to initiate functional tasks 50% of the time PT Short Term Goal 2 (Week 2): Pt will complete bed mobility with modA with bed features PT Short Term Goal 3 (Week 2): Pt will perform bed<>chair transfers with modA and LRAD PT Short Term Goal 4 (Week 2): Pt will ambulate 1080fith modA and LRAD  Skilled Therapeutic Interventions/Progress Updates:    Pt greeted supine in bed to start session - agreeable but requests to use bathroom. Also noted that when therapist was standing on L side of room, pt unable to track to locate and continues to show strong R gaze preference .She continues to stare blankly when asked questions and has significant delayed responses and processing time. Supine<>sit completed with totalA with use of bed features and she pushes strongly to the R once she initially sit's on the EOB - able to correct with manual placing her L hand to foot of bed to correct sitting posture. Next, completed stand<>pivot transfer towards her R side with maxA from EOB to [facility] modA for standing balance and totalA for lower body dressing. Pt continent of bladder once seated on BSC but noted her brief to be heavily saturated of prior urine and also incontinent of BM. Called NT in for +2 assist for pericare as PT assisted in standing pt with maxA from [facility]SC[facility] Farm[facility] Hillstand<>pivot transfer with maxA +2 from [facility]  to EOB and returned to supine with +64mxA. Pt cleansed for thoroughness in supine and new brief donned at bed level - she requires +2 maxA for rolling in bed. Pt repositioned in bed and bed set to chair position with bed alarm on and bed  rails up - all needs met.   Therapy Documentation Precautions:  Precautions Precautions: Fall Precaution Comments: expressive aphasia, very hard of hearing (lost left hearing aid); left inattention; right pusher; right sided weakness/apraxia Restrictions Weight Bearing Restrictions: No General:    Therapy/Group: Individual Therapy  CAlger Simons7/22/2022, 7:41 AM

## 2020-08-14 NOTE — Plan of Care (Signed)
  Problem: Consults Goal: RH STROKE PATIENT EDUCATION Description: See Patient Education module for education specifics  Outcome: Progressing   Problem: RH BOWEL ELIMINATION Goal: RH STG MANAGE BOWEL WITH ASSISTANCE Description: STG Manage Bowel with Max Assistance. Outcome: Progressing Goal: RH STG MANAGE BOWEL W/MEDICATION W/ASSISTANCE Description: STG Manage Bowel with Medication with Mod I Assistance. Outcome: Progressing   Problem: RH BLADDER ELIMINATION Goal: RH STG MANAGE BLADDER WITH ASSISTANCE Description: STG Manage Bladder With max  Assistance Outcome: Progressing   Problem: RH SAFETY Goal: RH STG ADHERE TO SAFETY PRECAUTIONS W/ASSISTANCE/DEVICE Description: STG Adhere to Safety Precautions With cues/reminders Assistance/Device. Outcome: Progressing   Problem: RH COGNITION-NURSING Goal: RH STG USES MEMORY AIDS/STRATEGIES W/ASSIST TO PROBLEM SOLVE Description: STG Uses Memory Aids/Strategies With cues/reminders Assistance to Problem Solve. Outcome: Progressing   Problem: RH PAIN MANAGEMENT Goal: RH STG PAIN MANAGED AT OR BELOW PT'S PAIN GOAL Description: At or below level 4 Outcome: Progressing   Problem: RH KNOWLEDGE DEFICIT Goal: RH STG INCREASE KNOWLEDGE OF DIABETES Description: Patient and family will be able to manage DM using dietary modification using handouts and educational resources  Outcome: Progressing Goal: RH STG INCREASE KNOWLEDGE OF HYPERTENSION Description: Patient and family will be able to manage HTN using medications and dietary modification using handouts and educational resources  Outcome: Progressing Goal: RH STG INCREASE KNOWLEGDE OF HYPERLIPIDEMIA Description: Patient and family will be able to manage HLD using medications and dietary modification using handouts and educational resources  Outcome: Progressing Goal: RH STG INCREASE KNOWLEDGE OF STROKE PROPHYLAXIS Description: Patient and family will be able to manage secondary stroke risks  using medications and dietary modification using handouts and educational resources  Outcome: Progressing   

## 2020-08-14 NOTE — Progress Notes (Signed)
Speech Language Pathology Daily Session Note  Patient Details  Name: Ellen Barajas MRN: [id number] Date of Birth: 12-Feb-1928  Today's Date: 08/14/2020 SLP Individual Time: 8413-2440 SLP Individual Time Calculation (min): 35 min  Short Term Goals: Week 2: SLP Short Term Goal 1 (Week 2): STG=LTG's (ELOS 7/28)  Skilled Therapeutic Interventions:   Patient seen for skilled ST session focusing on aphasia goals. Patient required modA cues overall to initiate responses to questions. She did demonstrate improved response accuracy with SLP providing written question as well as auditory presentation. When asked how she was feeling, patient replied "Im confused" and when SLP asked f/u question, she stated, "how I got into this area". She did demonstrate awareness that she was in a medial type facility but when SLP provided cues such as her daughter's name, that she had a stroke, she did not demonstrate any acknowledgement and required mod-max A cues to attend. At very end of session, patient spontaneously requested, "Ive got to pee". She continues to benefit from skilled SLP intervention to maximize cognitive-linguistic and swallow function prior to discharge.  Pain Pain Assessment Pain Scale: Faces Faces Pain Scale: No hurt  Therapy/Group: Individual Therapy  [name], MA, CCC-SLP Speech Therapy

## 2020-08-14 NOTE — Progress Notes (Addendum)
[employer] engineer, contracting [facility]) Hospital Liaison: RN note    Notified by Transition of Care Manger, Larene Pickett, CSW of patient/family request for [facility] services at home after discharge. Chart and patient information under review by [facility] physician. Hospice eligibility  confirmed.    Writer spoke with niece, [name] to initiate education related to hospice philosophy, services and team approach to care. [name] verbalized understanding of information given.  Please send signed and completed DNR form home with patient/family. Patient will need prescriptions for discharge comfort medications.     DME needs have been discussed, patient currently has the following equipment in the home: cane, W/C, 3N1 and shower chair.  Patient/family requests the following DME for delivery to the home: hospital bed.  [facility] equipment manager has been notified and will contact DME provider to arrange delivery to the home. Home address has been verified and is correct in the chart. [name] is the family member to contact to arrange time of delivery.     [facility] aware of the above. Please notify [facility] when patient is ready to leave the unit at discharge. (Call [phone] or [phone] after 5pm.) [facility] information and contact numbers given to [facility].       A Please do not hesitate to call with questions.    Thank you,   Elsie Saas, RN, [facility]      [facility] Liaison   [phone]

## 2020-08-15 NOTE — Plan of Care (Signed)
  Problem: Consults Goal: RH STROKE PATIENT EDUCATION Description: See Patient Education module for education specifics  Outcome: Progressing   Problem: RH BOWEL ELIMINATION Goal: RH STG MANAGE BOWEL WITH ASSISTANCE Description: STG Manage Bowel with Max Assistance. Outcome: Progressing Goal: RH STG MANAGE BOWEL W/MEDICATION W/ASSISTANCE Description: STG Manage Bowel with Medication with Mod I Assistance. Outcome: Progressing   Problem: RH BLADDER ELIMINATION Goal: RH STG MANAGE BLADDER WITH ASSISTANCE Description: STG Manage Bladder With max  Assistance Outcome: Progressing   Problem: RH SAFETY Goal: RH STG ADHERE TO SAFETY PRECAUTIONS W/ASSISTANCE/DEVICE Description: STG Adhere to Safety Precautions With cues/reminders Assistance/Device. Outcome: Progressing   Problem: RH COGNITION-NURSING Goal: RH STG USES MEMORY AIDS/STRATEGIES W/ASSIST TO PROBLEM SOLVE Description: STG Uses Memory Aids/Strategies With cues/reminders Assistance to Problem Solve. Outcome: Progressing   Problem: RH PAIN MANAGEMENT Goal: RH STG PAIN MANAGED AT OR BELOW PT'S PAIN GOAL Description: At or below level 4 Outcome: Progressing   Problem: RH KNOWLEDGE DEFICIT Goal: RH STG INCREASE KNOWLEDGE OF DIABETES Description: Patient and family will be able to manage DM using dietary modification using handouts and educational resources  Outcome: Progressing Goal: RH STG INCREASE KNOWLEDGE OF HYPERTENSION Description: Patient and family will be able to manage HTN using medications and dietary modification using handouts and educational resources  Outcome: Progressing Goal: RH STG INCREASE KNOWLEGDE OF HYPERLIPIDEMIA Description: Patient and family will be able to manage HLD using medications and dietary modification using handouts and educational resources  Outcome: Progressing Goal: RH STG INCREASE KNOWLEDGE OF STROKE PROPHYLAXIS Description: Patient and family will be able to manage secondary stroke risks  using medications and dietary modification using handouts and educational resources  Outcome: Progressing

## 2020-08-15 NOTE — Progress Notes (Signed)
Speech Language Pathology Daily Session Note  Patient Details  Name: Ellen Barajas MRN: [id number] Date of Birth: [birth year]  Today's Date: 08/15/2020 SLP Individual Time: 1415-1500 SLP Individual Time Calculation (min): 45 min  Short Term Goals: Week 2: SLP Short Term Goal 1 (Week 2): STG=LTG's (ELOS 7/28)  Skilled Therapeutic Interventions: Patient agreeable to skilled ST intervention with focus on language goals. Patient was accompanied by her niece Ellen Barajas. Educated niece of recent diet advancement to Dys 2 who demonstrated understanding by teach back of appropriate consistencies. Facilitated body part ID with 27% accuracy without cues, progressing to 81% accuracy with mod-to-max A sentence completion and/or field of 2 choices. Frequent semantic and phonemic paraphasias noted. Automatic speech tasks including counting 1-10: 100% accuracy with min A verbal cues fading to no cues. No response for days of week or months of year. Patient engaged in singing of "Amazing [name]", "[place]", and "At the [employer]" without difficulty. Of note, patient was able to initiate singing [name] and At the [place] independently. Required verbal cues and intoning to initiate [place] in which she was able to persist once started. Following singing, patient appeared to respond to open-ended questions with less hesitation and word finding difficulty. Patient was left with nurse at end of session and niece at bedside who planned to play music on her phone and continue to sing with patient, as this appeared to bring her joy as evidenced by smiling and laughing. Continue per SLP plan of care.    Pain Pain Assessment Pain Scale: 0-10 Pain Score: 0-No pain  Therapy/Group: Individual Therapy  Ellen Barajas 08/15/2020, 3:58 PM

## 2020-08-15 NOTE — Progress Notes (Addendum)
PROGRESS NOTE   Subjective/Complaints:  Discussed with nsg, questions about daily EKG, pt has sig cognitive dysfunction, cannot answer appropriately    ROS: limited due to cognition/behavioral    Objective:   No results found. No results for input(s): WBC, HGB, HCT, PLT in the last 72 hours.  No results for input(s): NA, K, CL, CO2, GLUCOSE, BUN, CREATININE, CALCIUM in the last 72 hours.   Intake/Output Summary (Last 24 hours) at 08/15/2020 1030 Last data filed at 08/15/2020 0815 Gross per 24 hour  Intake 500 ml  Output --  Net 500 ml         Physical Exam: Vital Signs Blood pressure (!) 156/86, pulse 95, temperature 97.6 F (36.4 C), temperature source Oral, resp. rate 20, height 5\' 3"  (1.6 m), weight 63.3 kg, SpO2 99 %.    General: No acute distress Mood and affect are appropriate Heart: Regular rate and rhythm no rubs murmurs or extra sounds Lungs: Clear to auscultation, breathing unlabored, no rales or wheezes Abdomen: Positive bowel sounds, soft nontender to palpation, nondistended Extremities: No clubbing, cyanosis, or edema  Neuro: aphasic- nodded yes and no to simple commands/questions- verbalized today mostly garble. Did answer one question appropriately. Followed a few simple commands. Still very apraxic. RIght gaze preference  Alert. Left sided neglect as well as probable field cut  moves left side automatically, less to command. Grips right hand firmly 4/5 strength. But with increase UE flexor tone and persistent grasp, RLE 3-/5 with increased extensor tone, LUE and LLE 4/5  Assessment/Plan: 1. Functional deficits which require 3+ hours per day of interdisciplinary therapy in a comprehensive inpatient rehab setting. Physiatrist is providing close team supervision and 24 hour management of active medical problems listed below. Physiatrist and rehab team continue to assess barriers to discharge/monitor  patient progress toward functional and medical goals  Care Tool:  Bathing    Body parts bathed by patient: Face, Chest, Abdomen   Body parts bathed by helper: Front perineal area Body parts n/a: Right arm, Left arm, Front perineal area, Buttocks, Right upper leg, Left upper leg, Left lower leg, Right lower leg   Bathing assist Assist Level: Total Assistance - Patient < 25%     Upper Body Dressing/Undressing Upper body dressing   What is the patient wearing?: Pull over shirt    Upper body assist Assist Level: Maximal Assistance - Patient 25 - 49%    Lower Body Dressing/Undressing Lower body dressing      What is the patient wearing?: Pants     Lower body assist Assist for lower body dressing: Total Assistance - Patient < 25%     Toileting Toileting    Toileting assist Assist for toileting: Dependent - Patient 0%     Transfers Chair/bed transfer  Transfers assist     Chair/bed transfer assist level: Maximal Assistance - Patient 25 - 49%     Locomotion Ambulation   Ambulation assist      Assist level: Maximal Assistance - Patient 25 - 49% Assistive device: Walker-rolling Max distance: 10'   Walk 10 feet activity   Assist  Walk 10 feet activity did not occur: Safety/medical concerns  Assist level:  2 helpers Assistive device: Hand held assist   Walk 50 feet activity   Assist Walk 50 feet with 2 turns activity did not occur: Safety/medical concerns  Assist level: 2 helpers Assistive device: Hand held assist    Walk 150 feet activity   Assist Walk 150 feet activity did not occur: Safety/medical concerns         Walk 10 feet on uneven surface  activity   Assist Walk 10 feet on uneven surfaces activity did not occur: Safety/medical concerns         Wheelchair     Assist Will patient use wheelchair at discharge?: No   Wheelchair activity did not occur: N/A         Wheelchair 50 feet with 2 turns  activity    Assist    Wheelchair 50 feet with 2 turns activity did not occur: N/A       Wheelchair 150 feet activity     Assist  Wheelchair 150 feet activity did not occur: N/A      BP (!) 156/86 (BP Location: Right Arm)   Pulse 95   Temp 97.6 F (36.4 C) (Oral)   Resp 20   Ht 5\' 3"  (1.6 m)   Wt 63.3 kg   SpO2 99%   BMI 24.72 kg/m   Medical Problem List and Plan: 1.  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   -palliative care consult placed- planning home hospice if pt discharges to home   -con't PT, OT and SLP- hospice planned 2.  Antithrombotics: -DVT/anticoagulation: Xarelto             -antiplatelet therapy: Plavix 75 mg daily 3. Pain Management: Tylenol as needed 4. Mood: Provide emotional support             -antipsychotic agents: N/A 5. Neuropsych: This patient is NOT capable of making decisions on her own behalf. 6. Skin/Wound Care: Routine skin checks 7. Fluids/Electrolytes/Nutrition: Routine in and outs with follow-up chemistries. Labs reviewed and stable.  8.  Chronic atrial fibrillation.  Continue Xarelto.  Follow-up cardiology services with daily EKGs: stable with metoprolol 50mg  BID. EKGs showing Afib normal rate with nl QTc, Will need to clarify with cardiology how long pt needs daily EKG 9.  Hypertension/atrial fibrillation.  Continue Cozaar 25 mg daily, Lopressor 37.5 mg twice daily. BP elevated to  increase Lopressor to 75mg  BID on 7/19 monitor effect. Labile- continue current regimen.   7/23 BP labile,check ortho vitals  Vitals:   08/14/20 1950 08/15/20 0434  BP: (!) 128/59 (!) 156/86  Pulse: 92  95  Resp: 20 20  Temp: 98 F (36.7 C) 97.6 F (36.4 C)  SpO2: 96% 99%    10.  Chronic systolic congestive heart failure.  Continue Lasix 20 mg daily.  Monitor for any signs of fluid overload   Filed Weights   08/12/20 2008 08/13/20 2038 08/15/20 0500  Weight: 65.6 kg 65.7 kg 63.3 kg   11.  CAD with history of CABG. follow-up cardiology services 12.  Hyperlipidemia.  Continue  Lipitor 13.  Multiple facial fractures.  Follow-up outpatient DR Elenore Rota.  Placed on empiric Keflex x7 days 14. Bowel and Bladder incont, likely cognitive/awareness, timed toileting- discussed with niece 78. Hard of hearing: please use masks with transparent piece over lips when possible- discussed with therapy and niece. Use hearing aides.  16. Decreased initiation  Amantadine started 7/16. monitor trial -continue on current dose of 100mg  daily  7/22- is more awake/more interactive then before the Amantadine, so will con't for now.   LOS: 10 days A FACE TO FACE EVALUATION WAS PERFORMED  8/22 08/15/2020, 10:30 AM

## 2020-08-15 NOTE — Progress Notes (Signed)
Occupational Therapy Session Note  Patient Details  Name: Ellen Barajas MRN: [id number] Date of Birth: [birth year]  Today's Date: 08/15/2020 OT Individual Time: 0821-0903 OT Individual Time Calculation (min): 42 min    Short Term Goals: Week 1:  OT Short Term Goal 1 (Week 1): Pt will complete UB dressing with mod assist using hemi techniques OT Short Term Goal 1 - Progress (Week 1): Not met OT Short Term Goal 2 (Week 1): Pt will complete sit<>stand with min assist in preperation for LB dressing and bathing. OT Short Term Goal 2 - Progress (Week 1): Not met OT Short Term Goal 3 (Week 1): Pt will scan to left visual field to retrieve items from sink with mod cues. OT Short Term Goal 3 - Progress (Week 1): Not met OT Short Term Goal 4 (Week 1): Pt will complete toilet transfer with mod assist. OT Short Term Goal 4 - Progress (Week 1): Not met  Skilled Therapeutic Interventions/Progress Updates:    Pt received semi-reclined in bed, did not verbalize throughout session, but appears agreeable to therapy. Session focus on self-care retraining, midline orientation, activity tolerance, functional transfers, static sitting balance, command follow, and BUE NMR in prep for improved ADL/func mobility performance + decreased caregiver burden. Rolled > R with max A, rolled L with total A to don pants total A at bed level. Came to sitting EOB with total A. Able to maintain static sitting balance intermittently with CGA, but did demonstrate moderate to severe L lean at times, but attempting to self-correct with LUE. Ultimately req mod to max A to return to midline. Doffed shirt with max A, able to remove her LUE. Donned new shirt with max A, able to assist by pulling shirt over abdomen. Pt washed face with mod A for thoroughness and heavy verbal and tactile cues for initiation. When presented brush, able to bring brush near hair, but did not initiate combing despite max multimodal cuing. Applied lotion to BUE, after  heavy demonstration, pt able to rub hands together to rub lotion in. Returned to supine total A, req + 2 to boost up in bed.    Pt left semi-reclined in bed with hemi side protected with 4 bed rails up,  bed alarm engaged, call bell in reach, and all immediate needs met.    Therapy Documentation Precautions:  Precautions Precautions: Fall Precaution Comments: expressive aphasia, very hard of hearing (lost left hearing aid); left inattention; right pusher; right sided weakness/apraxia Restrictions Weight Bearing Restrictions: No  Pain: no s/sx throughout session   ADL: See Care Tool for more details.   Therapy/Group: Individual Therapy  Volanda Napoleon MS, OTR/L  08/15/2020, 6:49 AM

## 2020-08-15 NOTE — Progress Notes (Signed)
Physical Therapy Session Note  Patient Details  Name: Ellen Barajas MRN: [id number] Date of Birth: 18-Mar-1928  Today's Date: 08/15/2020 PT Individual Time: 1020-1100 PT Individual Time Calculation (min): 40 min   Short Term Goals: Week 2:  PT Short Term Goal 1 (Week 2): Pt will demonstrate improved ability to initiate functional tasks 50% of the time PT Short Term Goal 2 (Week 2): Pt will complete bed mobility with modA with bed features PT Short Term Goal 3 (Week 2): Pt will perform bed<>chair transfers with modA and LRAD PT Short Term Goal 4 (Week 2): Pt will ambulate 30ft with modA and LRAD  Skilled Therapeutic Interventions/Progress Updates:    Introduced self to pt while standing on her L side and pt awakens to voice but unable to track laterally to the L to locate therapist. Continues to demo R gaze preference and likely has L visual field cut 2/2 R PCA infarct. She was able to locate therapist when standing directly in front of her. She is agreeable to PT tx without reports of pain - when asked, she reports need to use the bathroom. Supine<>sit completed with modA with use of bed features. Able to sit EOB with minA and completes stand<>pivot transfer with maxA to [facility] and needing totalA for managing pants/brief. Pt noted to already be incontinent of bowel and bladder due to saturated brief. She was continent of further urine and bowel after time allowed to sit on [facility]. Required +2 totalA for pericare and brief change and maxA for stand<>pivot transfer back to bed. Completed additional transfer with maxA back to her w/c and was wheeled outside for fresh air and to work on visual scanning. Directed pt in visual scanning to her L side and instructed to locate various environmental objects (trees, signs, poles, cars, etc). She was able to locate ~75% of items directed to but had obvious difficulty scanning fully to the L and identify objects in L visual field. Pt much more interactive while outdoors  and appeared to be more participatory and alert. She may benefit from spending more time outside of room at the [facility] to allow interaction and stimulation - will consult team to determine. Pt wheeled back upstairs to her room and she remained seated in w/c at end of session with safety belt alarm on and all needs in reach.   Therapy Documentation Precautions:  Precautions Precautions: Fall Precaution Comments: expressive aphasia, very hard of hearing (lost left hearing aid); left inattention; right pusher; right sided weakness/apraxia Restrictions Weight Bearing Restrictions: No General:    Therapy/Group: Individual Therapy  Orrin Brigham 08/15/2020, 7:38 AM

## 2020-08-16 NOTE — Progress Notes (Signed)
Speech Language Pathology Daily Session Note  Patient Details  Name: Ellen Barajas MRN: [id number] Date of Birth: 20-Jan-1929  Today's Date: 08/16/2020 SLP Individual Time: 1350-1430 SLP Individual Time Calculation (min): 40 min  Short Term Goals: Week 2: SLP Short Term Goal 1 (Week 2): STG=LTG's (ELOS 7/28)  Skilled Therapeutic Interventions:  Pt was seen for skilled ST targeting goals for communication.  Upon arrival, pt was sitting upright in wheelchair awake, alert, and agreeable to participate in ST.  Pt's verbal expression was characterized by jargon with poor comprehension/awareness of errors.  Pt was able to name basic, familiar objects with max assist multimodal cues in ~50-75% of opportunities and could sing along with familiar gospel hymns with mod assist verbal cues.  Pt was left in wheelchair with chair alarm set and call bell within reach.  Continue per current plan of care.    Pain Pain Assessment Pain Scale: 0-10 Pain Score: 0-No pain  Therapy/Group: Individual Therapy  Ellen Barajas, Melanee Spry 08/16/2020, 2:28 PM

## 2020-08-16 NOTE — Progress Notes (Signed)
Civil engineer, contracting [facility]) Hospital Liaison: RN note    Chart and patient information have been reviewed by [facility] physician. Hospice eligibility confirmed.    [facility] Liaisons will continue to follow to answer questions as needed and to coordinate admission onto hospice services after discharge. Please do not hesitate to call with questions.    Thank you for the opportunity to participate in this pt's care.     Gillian Scarce, BSN, RN  [facility] Liaison   [phone] (24h on call)

## 2020-08-16 NOTE — Progress Notes (Signed)
Occupational Therapy Session Note  Patient Details  Name: Ellen Barajas MRN: [id number] Date of Birth: 1928/01/29  Today's Date: 08/16/2020 OT Individual Time: 9774-1423 OT Individual Time Calculation (min): 55 min    Short Term Goals: Week 2:  OT Short Term Goal 1 (Week 2): Pt will donn/doff shirt with mod assist. OT Short Term Goal 2 (Week 2): Pt will complete squat pivot transfer with max assist. OT Short Term Goal 3 (Week 2): Pt will complete UB bathing with mod assist.  Skilled Therapeutic Interventions/Progress Updates:    Patient in bed, alert.  She answers direct questions 80% with short responses - occ difficulty with expressing needs.  She denies pain.  Lower body bathing/dressing at bed level with rolling right min A, max A to roll left.  Max A for bathing and LB dressing.  Supine to sit mod/max A.   Sit pivot transfer bed to w/c with max A.  UB bathing/dressing and grooming tasks w/c level at sink.  Mod a [place] shirt, hair care mod A (shampoo cap).  She remained seated in w/c at close of session, seat belt alarm set and call bell/tray table in front of patient.    Therapy Documentation Precautions:  Precautions Precautions: Fall Precaution Comments: expressive aphasia, very hard of hearing (lost left hearing aid); left inattention; right pusher; right sided weakness/apraxia Restrictions Weight Bearing Restrictions: No  Therapy/Group: Individual Therapy  Barrie Lyme 08/16/2020, 7:29 AM

## 2020-08-16 NOTE — Plan of Care (Signed)
  Problem: Consults Goal: RH STROKE PATIENT EDUCATION Description: See Patient Education module for education specifics  Outcome: Progressing   Problem: RH BOWEL ELIMINATION Goal: RH STG MANAGE BOWEL WITH ASSISTANCE Description: STG Manage Bowel with Max Assistance. Outcome: Progressing Goal: RH STG MANAGE BOWEL W/MEDICATION W/ASSISTANCE Description: STG Manage Bowel with Medication with Mod I Assistance. Outcome: Progressing   Problem: RH BLADDER ELIMINATION Goal: RH STG MANAGE BLADDER WITH ASSISTANCE Description: STG Manage Bladder With max  Assistance Outcome: Progressing   Problem: RH SAFETY Goal: RH STG ADHERE TO SAFETY PRECAUTIONS W/ASSISTANCE/DEVICE Description: STG Adhere to Safety Precautions With cues/reminders Assistance/Device. Outcome: Progressing   Problem: RH COGNITION-NURSING Goal: RH STG USES MEMORY AIDS/STRATEGIES W/ASSIST TO PROBLEM SOLVE Description: STG Uses Memory Aids/Strategies With cues/reminders Assistance to Problem Solve. Outcome: Progressing   Problem: RH PAIN MANAGEMENT Goal: RH STG PAIN MANAGED AT OR BELOW PT'S PAIN GOAL Description: At or below level 4 Outcome: Progressing   Problem: RH KNOWLEDGE DEFICIT Goal: RH STG INCREASE KNOWLEDGE OF DIABETES Description: Patient and family will be able to manage DM using dietary modification using handouts and educational resources  Outcome: Progressing Goal: RH STG INCREASE KNOWLEDGE OF HYPERTENSION Description: Patient and family will be able to manage HTN using medications and dietary modification using handouts and educational resources  Outcome: Progressing Goal: RH STG INCREASE KNOWLEGDE OF HYPERLIPIDEMIA Description: Patient and family will be able to manage HLD using medications and dietary modification using handouts and educational resources  Outcome: Progressing Goal: RH STG INCREASE KNOWLEDGE OF STROKE PROPHYLAXIS Description: Patient and family will be able to manage secondary stroke risks  using medications and dietary modification using handouts and educational resources  Outcome: Progressing   

## 2020-08-16 NOTE — Progress Notes (Signed)
Physical Therapy Session Note  Patient Details  Name: Ellen Barajas MRN: [id number] Date of Birth: 14-Nov-1928  Today's Date: 08/16/2020 PT Individual Time: 1115-1200 PT Individual Time Calculation (min): 45 min   Short Term Goals: Week 2:  PT Short Term Goal 1 (Week 2): Pt will demonstrate improved ability to initiate functional tasks 50% of the time PT Short Term Goal 2 (Week 2): Pt will complete bed mobility with modA with bed features PT Short Term Goal 3 (Week 2): Pt will perform bed<>chair transfers with modA and LRAD PT Short Term Goal 4 (Week 2): Pt will ambulate 46ft with modA and LRAD  Skilled Therapeutic Interventions/Progress Updates:    Pt received seated in w/c in room, remains non-verbal throughout session but appears agreeable to PT session. No indications of pain. Dependent transport via w/c to/from therapy gym. Sit to stand in // bars initially with max A progressing to min A with increased understanding of task and time allowed to complete transfer. Ambulation 2 x 5 ft in // bars with min A needed for balance, max A needed at times to advance RLE due to poor ability to motor plan to step with LE as well as assist needed to weight shift onto LLE. Pt found to be incontinent in brief. Sit to stand with assist x 2 to sink for safety. Pt again voids BM and urine in standing at sink in brief. Pt unable to verbalize need to use bathroom prior to voiding in standing at sink. Pt is dependent for pericare and brief change following void. Pt left seated in w/c in room with needs in reach, quick release belt and chair alarm in place at end of session.  Therapy Documentation Precautions:  Precautions Precautions: Fall Precaution Comments: expressive aphasia, very hard of hearing (lost left hearing aid); left inattention; right pusher; right sided weakness/apraxia Restrictions Weight Bearing Restrictions: No    Therapy/Group: Individual Therapy   Peter Congo, PT, DPT,  CSRS  08/16/2020, 12:24 PM

## 2020-08-17 NOTE — Progress Notes (Signed)
Physical Therapy Session Note  Patient Details  Name: Ellen Barajas MRN: [id number] Date of Birth: [birth year]  Today's Date: 08/17/2020 PT Individual Time: 7371-0626 PT Individual Time Calculation (min): 27 min   Short Term Goals: Week 2:  PT Short Term Goal 1 (Week 2): Pt will demonstrate improved ability to initiate functional tasks 50% of the time PT Short Term Goal 2 (Week 2): Pt will complete bed mobility with modA with bed features PT Short Term Goal 3 (Week 2): Pt will perform bed<>chair transfers with modA and LRAD PT Short Term Goal 4 (Week 2): Pt will ambulate 68ft with modA and LRAD  Skilled Therapeutic Interventions/Progress Updates:     Pt greeted seated in w/c to start session - She appears confused when therapist enters room, and immediately reports "i'm not sure if I confide in you." With reassuring, pt agreeable to PT tx. Wheeled in w/c for time management to main rehab gym and wheeled in // bars. Worked on sit<>stands in // bars which required minA for powering to rise and modA for balance due to retropulsion and rotated thoracic posture - she used LUE to // bar to assist in pulling to stand. Attempted to initiate functional gait training but pt unable to initiate R sided movement and R leg showing extensor tone with inability to lift off of ground and somewhat ?resistive. Pt becoming emotional and frustrated and had difficulty getting her words out to explain. Pt returned to her room in w/c and assisted back to bed via stand<>pivot with modA back to bed. She required totalA for repositioning in bed and remained in bed at end of session with [facility] raised to replicate chair position. Bed alarm on and bed rails up, soft call bell in reach.   Pt appears to be more vocal than sessions past but expressive aphasia impacting ability to communicate needs and wants.  Therapy Documentation Precautions:  Precautions Precautions: Fall Precaution Comments: expressive aphasia, very hard of  hearing (lost left hearing aid); left inattention; right pusher; right sided weakness/apraxia Restrictions Weight Bearing Restrictions: No General:     Therapy/Group: Individual Therapy  Tangee Marszalek P Britiney Blahnik 08/17/2020, 7:45 AM

## 2020-08-17 NOTE — Progress Notes (Signed)
PROGRESS NOTE   Subjective/Complaints: No complaints this morning Aphasic, apraxic Calm and comfortable while hair is being brushed by Amil Amen OT   ROS: Limited due to cognition/behavioral    Objective:   No results found. No results for input(s): WBC, HGB, HCT, PLT in the last 72 hours.  No results for input(s): NA, K, CL, CO2, GLUCOSE, BUN, CREATININE, CALCIUM in the last 72 hours.   Intake/Output Summary (Last 24 hours) at 08/17/2020 1430 Last data filed at 08/17/2020 1305 Gross per 24 hour  Intake 500 ml  Output --  Net 500 ml        Physical Exam: Vital Signs Blood pressure (!) 121/47, pulse 75, temperature 97.6 F (36.4 C), temperature source Oral, resp. rate 20, height 5\' 3"  (1.6 m), weight 63.6 kg, SpO2 100 %.  Gen: no distress, normal appearing HEENT: oral mucosa pink and moist, NCAT Cardio: Reg rate Chest: normal effort, normal rate of breathing Abd: soft, non-distended Ext: no edema Psych: pleasant, normal affect Skin: intact   Neuro: aphasic- nodded yes and no to simple commands/questions- verbalized today mostly garble. Did answer one question appropriately. Followed a few simple commands. Still very apraxic. RIght gaze preference  Alert. Left sided neglect as well as probable field cut  moves left side automatically, less to command. Grips right hand firmly 4/5 strength. But with increase UE flexor tone and persistent grasp, RLE 3-/5 with increased extensor tone, LUE and LLE 4/5  Assessment/Plan: 1. Functional deficits which require 3+ hours per day of interdisciplinary therapy in a comprehensive inpatient rehab setting. Physiatrist is providing close team supervision and 24 hour management of active medical problems listed below. Physiatrist and rehab team continue to assess barriers to discharge/monitor patient progress toward functional and medical goals  Care Tool:  Bathing    Body parts  bathed by patient: Face   Body parts bathed by helper: Right arm, Left arm, Chest, Abdomen, Front perineal area, Buttocks, Left lower leg, Right lower leg, Left upper leg, Right upper leg Body parts n/a: Right arm, Left arm, Front perineal area, Buttocks, Right upper leg, Left upper leg, Left lower leg, Right lower leg   Bathing assist Assist Level: Total Assistance - Patient < 25%     Upper Body Dressing/Undressing Upper body dressing   What is the patient wearing?: Pull over shirt    Upper body assist Assist Level: Maximal Assistance - Patient 25 - 49%    Lower Body Dressing/Undressing Lower body dressing      What is the patient wearing?: Pants, Incontinence brief     Lower body assist Assist for lower body dressing: Total Assistance - Patient < 25%     Toileting Toileting    Toileting assist Assist for toileting: 2 Helpers     Transfers Chair/bed transfer  Transfers assist     Chair/bed transfer assist level: 2 Helpers     Locomotion Ambulation   Ambulation assist      Assist level: Maximal Assistance - Patient 25 - 49% Assistive device: Walker-rolling Max distance: 10'   Walk 10 feet activity   Assist  Walk 10 feet activity did not occur: Safety/medical concerns  Assist level: 2  helpers Assistive device: Hand held assist   Walk 50 feet activity   Assist Walk 50 feet with 2 turns activity did not occur: Safety/medical concerns  Assist level: 2 helpers Assistive device: Hand held assist    Walk 150 feet activity   Assist Walk 150 feet activity did not occur: Safety/medical concerns         Walk 10 feet on uneven surface  activity   Assist Walk 10 feet on uneven surfaces activity did not occur: Safety/medical concerns         Wheelchair     Assist Will patient use wheelchair at discharge?: No   Wheelchair activity did not occur: N/A         Wheelchair 50 feet with 2 turns activity    Assist    Wheelchair 50  feet with 2 turns activity did not occur: N/A       Wheelchair 150 feet activity     Assist  Wheelchair 150 feet activity did not occur: N/A      BP (!) 121/47 (BP Location: Right Arm)   Pulse 75   Temp 97.6 F (36.4 C) (Oral)   Resp 20   Ht 5\' 3"  (1.6 m)   Wt 63.6 kg   SpO2 100%   BMI 24.84 kg/m   Medical Problem List and Plan: 1.  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   -palliative care consult placed- planning home hospice if pt discharges to home   -con't PT, OT and SLP- hospice planned- acceptable candidate as per hospice 2.  Antithrombotics: -DVT/anticoagulation: Xarelto             -antiplatelet therapy: Plavix 75 mg daily 3. Pain Management: Tylenol as needed 4. Mood: Provide emotional support             -antipsychotic agents: N/A 5. Neuropsych: This patient is NOT capable of making decisions on her own behalf. 6. Skin/Wound Care: Routine skin checks 7. Fluids/Electrolytes/Nutrition: Routine in and outs with follow-up chemistries. Labs reviewed and stable.  8.  Chronic atrial fibrillation.  Continue Xarelto.  Follow-up cardiology services with daily EKGs: stable with metoprolol 50mg  BID. EKGs showing Afib normal rate with nl QTc, Will clarify with cardiology how long patient will need daily EKG given hospice status.  9.  Hypertension/atrial fibrillation.  Continue Cozaar 25 mg daily, Lopressor 37.5 mg twice daily. BP elevated to  increase Lopressor to 75mg  BID on 7/19 monitor effect. Labile: continue current regimen.  Vitals:   08/17/20 0425 08/17/20 1335  BP: (!) 156/66 (!) 121/47  Pulse: 62 75  Resp: 16 20   Temp: 97.9 F (36.6 C) 97.6 F (36.4 C)  SpO2: 98% 100%    10.  Chronic systolic congestive heart failure.  Continue Continue overload   Filed Weights   08/15/20 0500 08/16/20 0500 08/17/20 0503  Weight: 63.3 kg 63.7 kg 63.6 kg   11.  CAD with history of CABG. follow-up cardiology services 12.  Hyperlipidemia.  Continue Lipitor 13.  Multiple facial fractures.  Follow-up outpatient DR  Juengel.  Placed on empiric Keflex x7 days 14. Bowel and Bladder incont, likely cognitive/awareness, timed toileting- discussed with niece 103. Hard of hearing: please use masks with transparent piece over lips when possible- discussed with therapy and niece. Use hearing aides.  16. Decreased initiation  Amantadine started 7/16. monitor trial -continue on current dose of 100mg  daily  7/22- is more awake/more interactive then before the Amantadine, so will con't for now.   LOS: 12 days A FACE TO FACE EVALUATION WAS PERFORMED  Bennetta Rudden P Euna Armon 08/17/2020, 2:30 PM

## 2020-08-17 NOTE — Plan of Care (Signed)
  Problem: RH Attention Goal: LTG Patient will demonstrate this level of attention during functional activites (SLP) Description: LTG:  Patient will will demonstrate this level of attention during functional activites (SLP) Flowsheets (Taken 08/17/2020 0844) LTG: Patient will demonstrate this level of attention during cognitive/linguistic activities with assistance of (SLP): (downgraded due to slow progress) Moderate Assistance - Patient 50 - 74% Note: Downgraded due to reduced left scanning, error awareness and sustained attention over extended period of time

## 2020-08-17 NOTE — Progress Notes (Signed)
Occupational Therapy Session Note  Patient Details  Name: Ellen Barajas MRN: [id number] Date of Birth: 06-09-1928  Today's Date: 08/17/2020 OT Individual Time: 1100-1210 OT Individual Time Calculation (min): 70 min    Short Term Goals: Week 2:  OT Short Term Goal 1 (Week 2): Pt will donn/doff shirt with mod assist. OT Short Term Goal 2 (Week 2): Pt will complete squat pivot transfer with max assist. OT Short Term Goal 3 (Week 2): Pt will complete UB bathing with mod assist.  Skilled Therapeutic Interventions/Progress Updates:    Pt received in bed awake and unable to answer questions with clarity or with clear verbage.  Pt nodded yes when I suggested a bath.  Pt sat to EOB with total A and initially pushing back severely, max to have her lean forward at waist. Once in the correct position she could hold her static sit balance with close S.. Unable to safely have pt do a squat pivot with 1 person, so her NT arrived to provide me with support.  Total A sq pivot with 2 people.  At sink, max A and max cues with oral care, UB bathing, brushing hair.  Very minimal initiation with some perseverative behaviors.  Pt kept trying to wash L side of her face.  Pt kept closing her eyes but responded no when tired.    Had pt pull up on bed rail to stand with max A but then she was incontinent. NT assisted with obtaining a stedy.  Took pt to bathroom.  She had voided BM in her brief but was able to urinate more on the toilet.   Total A with cleansing, clothing but able to rise to stand in stedy with max A.  Pt resting in wc with belt alarm on, soft call light in reach and all needs met.   Therapy Documentation Precautions:  Precautions Precautions: Fall Precaution Comments: expressive aphasia, very hard of hearing (lost left hearing aid); left inattention; right pusher; right sided weakness/apraxia Restrictions Weight Bearing Restrictions: No   Pain: Pain Assessment Pain Score: 0-No  pain    Therapy/Group: Individual Therapy  [place] 08/17/2020, 9:06 AM

## 2020-08-17 NOTE — Progress Notes (Signed)
Speech Language Pathology Daily Session Note  Patient Details  Name: Ellen Barajas MRN: [id number] Date of Birth: 26-Oct-1928  Today's Date: 08/17/2020 SLP Individual Time: 0254-2706 SLP Individual Time Calculation (min): 40 min  Short Term Goals: Week 2: SLP Short Term Goal 1 (Week 2): STG=LTG's (ELOS 7/28)  Skilled Therapeutic Interventions:Skilled ST services focused on swallow and language skills. Pt was consuming dys 2 textures and thin liquids via cup on breakfast tray, pt required total A feeding solids and was able to consume liquids when cup was placed in her hand. Pt demonstrated appropriate mastication, oral clearance and timely swallow. SLP recommends dys 3 texture trials in upcoming sessions. SLP administered subsections of WAB screen. Pt was able to response to simple yes/no questions in 4 out 5 opportunities, complex yes/no questions in 3 out 5 opportunities with overall mod A verbal cues for auditory comprehension and repeating directions (bilateral hearing aids were donned.) Pt was able to repeat 2 out 3 words and 1 out 3 phrases accurately. Pt could follow 1 step commands with 50% accuracy with max A of demonstration or hand over hand assistance and then continue same command over 5 trials, but required hand over hand to complete a novel command. Pt was able to name 8 out 10 common objects with min A sentence completion cues and name the function of 4 out 5 objects. Pt demonstrates overall increase awareness of verbal and functional errors with max-mod A multimodal cues and required mod-max A visual cues to scan left of midline. Downgraded cognitive goals to mod A due to slow progress. Pt was left in room with call bell within reach and bed alarm set. SLP recommends to continue skilled services.     Pain Pain Assessment Pain Score: 0-No pain  Therapy/Group: Individual Therapy  Mckaila Duffus  [facility] 08/17/2020, 12:44 PM

## 2020-08-18 NOTE — Plan of Care (Signed)
  Problem: RH Car Transfers Goal: LTG Patient will perform car transfers with assist (PT) Description: LTG: Patient will perform car transfers with assistance (PT). Outcome: Not Applicable Note: Pt will require non-emergent ambulance ride home due to safety concerns. Goal discharged   Problem: RH Bed Mobility Goal: LTG Patient will perform bed mobility with assist (PT) Description: LTG: Patient will perform bed mobility with assistance, with/without cues (PT). Flowsheets (Taken 08/18/2020 0754) LTG: Pt will perform bed mobility with assistance level of: Moderate Assistance - Patient 50 - 74% Note: Downgraded due to slow progress    Problem: RH Bed to Chair Transfers Goal: LTG Patient will perform bed/chair transfers w/assist (PT) Description: LTG: Patient will perform bed to chair transfers with assistance (PT). Flowsheets (Taken 08/18/2020 0754) LTG: Pt will perform Bed to Chair Transfers with assistance level: Moderate Assistance - Patient 50 - 74% Note: Downgraded due to slow progress    Problem: RH Ambulation Goal: LTG Patient will ambulate in controlled environment (PT) Description: LTG: Patient will ambulate in a controlled environment, # of feet with assistance (PT). Flowsheets (Taken 08/18/2020 0754) LTG: Pt will ambulate in controlled environ  assist needed:: Moderate Assistance - Patient 50 - 74% LTG: Ambulation distance in controlled environment: 75ft Note: Downgraded due to slow progress  Goal: LTG Patient will ambulate in home environment (PT) Description: LTG: Patient will ambulate in home environment, # of feet with assistance (PT). Flowsheets (Taken 08/18/2020 0754) LTG: Pt will ambulate in home environ  assist needed:: Moderate Assistance - Patient 50 - 74% LTG: Ambulation distance in home environment: 54ft Note: Downgraded due to slow progress    Problem: RH Stairs Goal: LTG Patient will ambulate up and down stairs w/assist (PT) Description: LTG: Patient will  ambulate up and down # of stairs with assistance (PT) Flowsheets (Taken 08/18/2020 0754) LTG: Pt will ambulate up/down stairs assist needed:: Moderate Assistance - Patient 50 - 74% LTG: Pt will  ambulate up and down number of stairs: 2 Note: Downgraded due to slow progress

## 2020-08-18 NOTE — Plan of Care (Signed)
Problem: RH Balance Goal: LTG: Patient will maintain dynamic sitting balance (OT) Description: LTG:  Patient will maintain dynamic sitting balance with assistance during activities of daily living (OT) Flowsheets (Taken 08/18/2020 1317) LTG: Pt will maintain dynamic sitting balance during ADLs with: (LTG downgraded due to limited progress, cognitive deficits.) Maximal Assistance - Patient 25 - 49% Goal: LTG Patient will maintain dynamic standing with ADLs (OT) Description: LTG:  Patient will maintain dynamic standing balance with assist during activities of daily living (OT)  Flowsheets (Taken 08/18/2020 1317) LTG: Pt will maintain dynamic standing balance during ADLs with: (LTG downgraded due to limited progress, cognitive deficits.) Dependent - mechanical lift Note: LTG downgraded due to limited progress, cognitive deficits.   Problem: Sit to Stand Goal: LTG:  Patient will perform sit to stand in prep for activites of daily living with assistance level (OT) Description: LTG:  Patient will perform sit to stand in prep for activites of daily living with assistance level (OT) Flowsheets (Taken 08/18/2020 1317) LTG: PT will perform sit to stand in prep for activites of daily living with assistance level: (LTG downgraded due to limited progress, cognitive deficits.) Maximal Assistance - Patient 25 - 49% Note: LTG downgraded due to limited progress, cognitive deficits.   Problem: RH Eating Goal: LTG Patient will perform eating w/assist, cues/equip (OT) Description: LTG: Patient will perform eating with assist, with/without cues using equipment (OT) Flowsheets (Taken 08/18/2020 1317) LTG: Pt will perform eating with assistance level of: (LTG downgraded due to limited progress, cognitive deficits.) Moderate Assistance - Patient 50 - 74% Note: LTG downgraded due to limited progress, cognitive deficits.   Problem: RH Grooming Goal: LTG Patient will perform grooming w/assist,cues/equip  (OT) Description: LTG: Patient will perform grooming with assist, with/without cues using equipment (OT) Flowsheets (Taken 08/18/2020 1317) LTG: Pt will perform grooming with assistance level of: (LTG downgraded due to limited progress, cognitive deficits.) Maximal Assistance - Patient 25 - 49% Note: LTG downgraded due to limited progress, cognitive deficits.   Problem: RH Bathing Goal: LTG Patient will bathe all body parts with assist levels (OT) Description: LTG: Patient will bathe all body parts with assist levels (OT) Flowsheets (Taken 08/18/2020 1317) LTG: Pt will perform bathing with assistance level/cueing: (LTG downgraded due to limited progress, cognitive deficits.) Maximal Assistance - Patient 25 - 49% Note: LTG downgraded due to limited progress, cognitive deficits.   Problem: RH Dressing Goal: LTG Patient will perform upper body dressing (OT) Description: LTG Patient will perform upper body dressing with assist, with/without cues (OT). Flowsheets (Taken 08/18/2020 1317) LTG: Pt will perform upper body dressing with assistance level of: (LTG downgraded due to limited progress, cognitive deficits.) Maximal Assistance - Patient 25 - 49% Note: LTG downgraded due to limited progress, cognitive deficits. Goal: LTG Patient will perform lower body dressing w/assist (OT) Description: LTG: Patient will perform lower body dressing with assist, with/without cues in positioning using equipment (OT) Flowsheets (Taken 08/18/2020 1317) LTG: Pt will perform lower body dressing with assistance level of: (LTG downgraded due to limited progress, cognitive deficits.) Total Assistance - Patient < 25% Note: LTG downgraded due to limited progress, cognitive deficits.   Problem: RH Toileting Goal: LTG Patient will perform toileting task (3/3 steps) with assistance level (OT) Description: LTG: Patient will perform toileting task (3/3 steps) with assistance level (OT)  Flowsheets (Taken 08/18/2020  1317) LTG: Pt will perform toileting task (3/3 steps) with assistance level: (LTG downgraded due to limited progress, cognitive deficits.) Dependent - Patient equals 0% Note: LTG downgraded  due to limited progress, cognitive deficits.   Problem: RH Toilet Transfers Goal: LTG Patient will perform toilet transfers w/assist (OT) Description: LTG: Patient will perform toilet transfers with assist, with/without cues using equipment (OT) Flowsheets (Taken 08/18/2020 1317) LTG: Pt will perform toilet transfers with assistance level of: (LTG downgraded due to limited progress, cognitive deficits.) Maximal Assistance - Patient 25 - 49% Note: LTG downgraded due to limited progress, cognitive deficits.   Problem: RH Tub/Shower Transfers Goal: LTG Patient will perform tub/shower transfers w/assist (OT) Description: LTG: Patient will perform tub/shower transfers with assist, with/without cues using equipment (OT) Flowsheets (Taken 08/18/2020 1317) LTG: Pt will perform tub/shower stall transfers with assistance level of: (LTG discontinued as it is not safe for her to do shower transfers at this time due to strong posterior lean.) -- Note: LTG discontinued as it is not safe for her to do shower transfers at this time due to strong posterior lean.

## 2020-08-18 NOTE — Progress Notes (Signed)
Patient ID: Ellen Barajas, female   DOB: Oct 01, 1928, 87 y.o.   MRN: [id number] Spoke with niece-[name] who reports caregivers in place for Thursday and equipment will be there by then. Will schedule ambulance for 10;00 am and niece plans to be here at 9:00 for any discharge instructions to go over. Plan for discharge Thursday. Updated regarding team conference.

## 2020-08-18 NOTE — Progress Notes (Signed)
Physical Therapy Session Note  Patient Details  Name: Ellen Barajas MRN: [id number] Date of Birth: 04-Nov-1928  Today's Date: 08/18/2020 PT Individual Time: 0915-1000 PT Individual Time Calculation (min): 45 min   Short Term Goals: Week 2:  PT Short Term Goal 1 (Week 2): Pt will demonstrate improved ability to initiate functional tasks 50% of the time PT Short Term Goal 2 (Week 2): Pt will complete bed mobility with modA with bed features PT Short Term Goal 3 (Week 2): Pt will perform bed<>chair transfers with modA and LRAD PT Short Term Goal 4 (Week 2): Pt will ambulate 57f with modA and LRAD   Skilled Therapeutic Interventions/Progress Updates:    Pt received supine in bed to start session. Agreeable to PT tx. Remains to have difficulty with communicating needs/wants with expressive>receptive aphasia. She does voice need to use restroom when asked. Completed supine<>sit with maxA with use of bed features and requires totalA for scooting forwards towards EOB with use of chuck pad. Extensor tone present in RLE and requires time and multi-modal cues for "relaxing" into a flexed position. Completes stand<>pivot transfer with maxA from EOB to w/c, towards her R side - difficulty with initiating and sequencing. Wheeled into bathroom where she completes additional stand<>pivot transfer with maxA from [facility]c [facility] - requires totalA for pants/brief management (pt already incontinent of bowel and bladder with saturated brief). Pt then continent of bowel and bladder while seated on BSC - NT called for +2 assist for pericare. After cleansing, donned pants and brief with totalA but upon standing, pt began to further urinate. Required dressing change and retrieved disposable pants which were donned with totalA and NT provided pericare assist.. Completed stand<>pivot transfer with modA +2 back to her w/c. She was then wheeled outdoors for fresh air, change of scenery to increase stimulation, and work  on visual scanning in a natural environment. At reflection fountain, worked on visual scanning to the L, naming colors of flowers to the L, and objects to the L. When educated on "lighthouse" technique, she began to say "I don't care anymore about anything." With further questioning, she reports "I only care about my children." Provided reassurance and words of encouragement and purpose of rehabilitation. Pt then returned upstairs where she was stationed at the RN station to allow increased stimulation and human interaction. RN was notified and pt was secured with safety belt alarm. All needs met.   Therapy Documentation Precautions:  Precautions Precautions: Fall Precaution Comments: expressive aphasia, very hard of hearing (lost left hearing aid); left inattention; right pusher; right sided weakness/apraxia Restrictions Weight Bearing Restrictions: No General:    Therapy/Group: Individual Therapy  CAlger Simons7/26/2022, 7:47 AM

## 2020-08-18 NOTE — Progress Notes (Signed)
PROGRESS NOTE   Subjective/Complaints: Resting, does not express any complaints Plan for d/c to hospice Team conference today No issues overnight   ROS: Limited due to cognition/behavioral    Objective:   No results found. No results for input(s): WBC, HGB, HCT, PLT in the last 72 hours.  No results for input(s): NA, K, CL, CO2, GLUCOSE, BUN, CREATININE, CALCIUM in the last 72 hours.   Intake/Output Summary (Last 24 hours) at 08/18/2020 1012 Last data filed at 08/18/2020 0959 Gross per 24 hour  Intake 418 ml  Output --  Net 418 ml        Physical Exam: Vital Signs Blood pressure 137/83, pulse 65, temperature 98 F (36.7 C), temperature source Oral, resp. rate 16, height 5\' 3"  (1.6 m), weight 63.5 kg, SpO2 97 %. Gen: no distress, normal appearing HEENT: oral mucosa pink and moist, NCAT Cardio: Reg rate Chest: normal effort, normal rate of breathing Abd: soft, non-distended Ext: no edema Psych: pleasant, normal affect Skin: intact  Neuro: aphasic- nodded yes and no to simple commands/questions- verbalized today mostly garble. Did answer one question appropriately. Followed a few simple commands. Still very apraxic. RIght gaze preference  Alert. Left sided neglect as well as probable field cut  moves left side automatically, less to command. Grips right hand firmly 4/5 strength. But with increase UE flexor tone and persistent grasp, RLE 3-/5 with increased extensor tone, LUE and LLE 4/5  Assessment/Plan: 1. Functional deficits which require 3+ hours per day of interdisciplinary therapy in a comprehensive inpatient rehab setting. Physiatrist is providing close team supervision and 24 hour management of active medical problems listed below. Physiatrist and rehab team continue to assess barriers to discharge/monitor patient progress toward functional and medical goals  Care Tool:  Bathing    Body parts bathed by  patient: Face   Body parts bathed by helper: Right arm, Left arm, Chest, Abdomen, Front perineal area, Buttocks, Left lower leg, Right lower leg, Left upper leg, Right upper leg Body parts n/a: Right arm, Left arm, Front perineal area, Buttocks, Right upper leg, Left upper leg, Left lower leg, Right lower leg   Bathing assist Assist Level: Total Assistance - Patient < 25%     Upper Body Dressing/Undressing Upper body dressing   What is the patient wearing?: Pull over shirt    Upper body assist Assist Level: Maximal Assistance - Patient 25 - 49%    Lower Body Dressing/Undressing Lower body dressing      What is the patient wearing?: Pants, Incontinence brief     Lower body assist Assist for lower body dressing: Total Assistance - Patient < 25%     Toileting Toileting    Toileting assist Assist for toileting: 2 Helpers     Transfers Chair/bed transfer  Transfers assist     Chair/bed transfer assist level: 2 Helpers     Locomotion Ambulation   Ambulation assist      Assist level: Maximal Assistance - Patient 25 - 49% Assistive device: Walker-rolling Max distance: 10'   Walk 10 feet activity   Assist  Walk 10 feet activity did not occur: Safety/medical concerns  Assist level: 2 helpers Assistive device:  Hand held assist   Walk 50 feet activity   Assist Walk 50 feet with 2 turns activity did not occur: Safety/medical concerns  Assist level: 2 helpers Assistive device: Hand held assist    Walk 150 feet activity   Assist Walk 150 feet activity did not occur: Safety/medical concerns         Walk 10 feet on uneven surface  activity   Assist Walk 10 feet on uneven surfaces activity did not occur: Safety/medical concerns         Wheelchair     Assist Will patient use wheelchair at discharge?: No   Wheelchair activity did not occur: N/A         Wheelchair 50 feet with 2 turns activity    Assist    Wheelchair 50 feet with  2 turns activity did not occur: N/A       Wheelchair 150 feet activity     Assist  Wheelchair 150 feet activity did not occur: N/A      BP 137/83 (BP Location: Left Arm)   Pulse 65   Temp 98 F (36.7 C) (Oral)   Resp 16   Ht 5\' 3"  (1.6 m)   Wt 63.5 kg   SpO2 97%   BMI 24.80 kg/m   Medical Problem List and Plan: 1.  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   -palliative care consult placed- planning home hospice if pt discharges to home   -con't PT, OT and SLP- hospice planned- acceptable candidate as per hospice 2.  Antithrombotics: -DVT/anticoagulation: Xarelto             -antiplatelet therapy: Plavix 75 mg daily 3. Pain Management: Tylenol as needed 4. Mood: Provide emotional support             -antipsychotic agents: N/A 5. Neuropsych: This patient is NOT capable of making decisions on her own behalf. 6. Skin/Wound Care: Routine skin checks 7. Fluids/Electrolytes/Nutrition: Routine in and outs with follow-up chemistries. Labs reviewed and stable.  8.  Chronic atrial fibrillation.  Continue Xarelto.  Follow-up cardiology services with daily EKGs: stable with metoprolol 50mg  BID. EKGs showing Afib normal rate with nl QTc, Will clarify with cardiology how long patient will need daily EKG given hospice status.  9.  Hypertension/atrial fibrillation.  Continue Cozaar 25 mg daily, Lopressor 37.5 mg twice daily. BP elevated to  increase Lopressor to 75mg  BID on 7/19 monitor effect. Labile: continue current regimen.  Vitals:   08/17/20 2031 08/18/20 0408  BP: (!) 152/81 137/83  Pulse: 84 65  Resp: 16 16  Temp:  98 F (36.7 C)   SpO2: 98% 97%    10.  Chronic systolic congestive heart failure.  Continue Continue overload   Filed Weights   08/16/20 0500 08/17/20 0503 08/18/20 0542  Weight: 63.7 kg 63.6 kg 63.5 kg   11.  CAD with history of CABG. follow-up cardiology services 12.  Hyperlipidemia.  Continue Lipitor 13.  Multiple facial fractures.  Follow-up outpatient DR 08/18/20.  Placed on empiric Keflex x7 days  14. Bowel and Bladder incont, likely cognitive/awareness, timed toileting- discussed with niece 22. Hard of hearing: please use masks with transparent piece over lips when possible- discussed with therapy and niece. Use hearing aides.  16. Decreased initiation  Amantadine started 7/16 with good results. Increase to 200mg  LOS: 13 days A FACE TO FACE EVALUATION WAS PERFORMED  Rahkeem Senft P Nevea Spiewak 08/18/2020, 10:12 AM

## 2020-08-18 NOTE — Progress Notes (Signed)
[employer], [employer] [facility]) Hospital Liaison: RN note     Chart and patient information have been reviewed by [facility] physician. Hospice eligibility confirmed.     [facility] Liaisons will continue to follow to answer questions as needed and to coordinate admission onto hospice services after discharge. Please do not hesitate to call with questions.     Elsie Saas, RN, [facility]      [facility] Liaison   [phone]

## 2020-08-18 NOTE — Progress Notes (Signed)
Occupational Therapy Session Note  Patient Details  Name: Ellen Barajas MRN: [id number] Date of Birth: 1928/07/15  Today's Date: 08/18/2020 OT Individual Time: 1120-1200 OT Individual Time Calculation (min): 40 min    Short Term Goals: Week 2:  OT Short Term Goal 1 (Week 2): Pt will donn/doff shirt with mod assist. OT Short Term Goal 2 (Week 2): Pt will complete squat pivot transfer with max assist. OT Short Term Goal 3 (Week 2): Pt will complete UB bathing with mod assist.  Skilled Therapeutic Interventions/Progress Updates:    Pt received in wc at nursing station.  Pt taken back to her room to change shirts as she had some food on her shirt.  Pt shown 3 shirt options but kept saying 'no no no'.    Asked pt if she needed to toilet, pt said no.  Suggested pt try.   Obtained stedy.  With min A pt able to rise to stand in stedy.  Pt needed total A with clothing management and pt kept saying "why?" Pt could not comprehend explanation. Once her pants were down, pt sat on toilet and was able to void urine.  Tried to have pt cleanse herself but unable to follow directions.   Pt needed mod A to rise to stand in stedy.  Pt taken to sink to wash hands with max A.  Pt did wash face herself.   Worked on having pt choose bed or wc.  Communication very difficult - worked on pointing to where she wanted to be. Finally chose the bed.   Pt placed at EOB with stedy. Pt has a strong posterior lean.  Total A to shift to supine. Pt set up in bed with pillows to support her arms.  All needs met, alarm set.   Therapy Documentation Precautions:  Precautions Precautions: Fall Precaution Comments: expressive aphasia, very hard of hearing (lost left hearing aid); left inattention; right pusher; right sided weakness/apraxia Restrictions Weight Bearing Restrictions: No   Pain: Pain Assessment Pain Scale: 0-10 Pain Score: 0-No pain ADL: ADL Grooming: Maximal assistance Where Assessed-Grooming: Sitting at  sink Upper Body Bathing: Maximal assistance, Maximal cueing Where Assessed-Upper Body Bathing: Sitting at sink Lower Body Bathing: Maximal assistance, Maximal cueing Where Assessed-Lower Body Bathing: Sitting at sink Upper Body Dressing: Maximal assistance, Maximal cueing Where Assessed-Upper Body Dressing: Sitting at sink Lower Body Dressing: Dependent, Maximal cueing Where Assessed-Lower Body Dressing: Bed level Toileting: Dependent, Maximal cueing Where Assessed-Toileting: Bed level   Therapy/Group: Individual Therapy  [place] 08/18/2020, 8:33 AM

## 2020-08-18 NOTE — Progress Notes (Signed)
Speech Language Pathology Daily Session Note  Patient Details  Name: Ellen Barajas MRN: [id number] Date of Birth: [birth year]  Today's Date: 08/18/2020 SLP Individual Time: 4481-8563 SLP Individual Time Calculation (min): 42 min  Short Term Goals: Week 2: SLP Short Term Goal 1 (Week 2): STG=LTG's (ELOS 7/28)  Skilled Therapeutic Interventions:Skilled ST services focused on swallow and language skills. Pt consumed dys 3 texture trial snack with appropriate mastication, oral clearance and timely swallow. SLP recommend dys 3 texture trial tray prior to solid advancement. Pt was better able to demonstrate self-feeding today, required max A assistance with scooping but able to bring spoon and cup to mouth with mild anterior spillage while consuming dys 2 textures and thin via cup breakfast tray. Pt was able to express wants/needs at word word level when given two choices with 80% accuracy. Pt was able to name verbs in photographs when given max A verbal cues for name of object and semantic cues in 9 out 10 opportunities. Pt was able to answer yes/no question pertaining to wants/needs with 90% accuracy and min A visual cues to confirm response. Pt continues to demonstrate left inattention with ability to scan past midline with max-mod A multimodal cues. Pt also continues to demonstrate inconsistent initiation of verbal responses due to delayed processing and deficits in auditory comprehension. Pt was left in room with call bell within reach and bed alarm set. SLP recommends to continue skilled services.     Pain Pain Assessment Pain Scale: 0-10 Pain Score: 0-No pain  Therapy/Group: Individual Therapy  Dajahnae Vondra  [facility] 08/18/2020, 11:06 AM

## 2020-08-18 NOTE — Patient Care Conference (Signed)
Inpatient RehabilitationTeam Conference and Plan of Care Update Date: 08/18/2020   Time: 13:12 PM    Patient Name: Ellen Barajas      Medical Record Number: [id number]  Date of Birth: 21-Sep-1928 Sex: Female         Room/Bed: 4W21C/4W21C-01 Payor Info: Payor: [employer]ICARE / Plan: BCBS MEDICARE / Product Type: *No Product type* /    Admit Date/Time:  08/05/2020  4:37 PM  Primary Diagnosis:  Embolism of left anterior cerebral artery  Hospital Problems: Principal Problem:   Embolism of left anterior cerebral artery Active Problems:   Left middle cerebral artery stroke [facility])    Expected Discharge Date: Expected Discharge Date: 08/20/20  Team Members Present: Physician leading conference: Dr. Sula Soda Social Worker Present: Dossie Der, LCSW Nurse Present: Chana Bode, RN PT Present: Wynelle Link, PT OT Present: Dolphus Jenny, OT SLP Present: Colin Benton, SLP     Current Status/Progress Goal Weekly Team Focus  Bowel/Bladder             Swallow/Nutrition/ Hydration   dys 2 textures and thin liquids, min-supervision A  Min A  dys 3 trials and carryover of swallow strategies   ADL's   max to total UB, total with often needing 2 people LB, strong posterior lean and processing difficulties cause her to need max to total with mobility in bed to wc and toilet  min A-CGA (will be downgraded to max A)  famiy education, functional mobility training   Mobility   maxA bed mobility, maxA transfers, maxA short distance gait. Mobility limited by apraxia, inattention, L visual field cut, delayed processing//initiation  min assist but this will need to be downgraded  Participation, visual scanning, OOB tolerance, functional transfers, gait training as able, DC planning with caregiver and family education   Communication   max-mod A, 80% yes/no, naming objects/function 80%, most difficulity with auditory comprehension and increased automatic language/singing  mod  A  basic auditory comprehension, express wants/needs yes/no at word level   Safety/Cognition/ Behavioral Observations  Max-Mod A left inattention and error awareness, mod A sustained attention  Mod A - downgraded on 7/25  left scanning, sustained attention and error awareness   Pain             Skin               Discharge Planning:  HOme with hired 24/7 aides and hospice services. Equipment to be delivered this week and home via non-emergency ambulance Thursday   Team Discussion: MD adjusting meds for alertness. Progress limited by ideational apraxia, poor initiation, inattention or field cut, poor safety awareness and poor motor planning. Patient is incontinent of bowel and bladder despite timed toileting.  Patient on target to meet rehab goals: Currently min assist for sitting balance. Requires max - total assist for transfers. Requires start with right tracking to midline and then progress to the left. Tracks left with mod  - max assist. Needs cues for sequencing. Max - total assist overall for ADLs.   *See Care Plan and progress notes for long and short-term goals.   Revisions to Treatment Plan:  D3 trials, self feeding Downgraded SLP cognitive goals to mod assist   Teaching Needs: Safety, transfers, toileting, medications, etc.  Current Barriers to Discharge: Home enviroment access/layout, Incontinence, and Lack of/limited family support  Possible Resolutions to Barriers: Discharge home with hired assistance and Hospice care Non- emergent ambulance transport to home    Medical Summary Current  Status: CVA, impaired initiation, neglect, apraxia, HTN, depression, incontinent of bowel and bladder  Barriers to Discharge: Behavior;Medical stability;Decreased family/caregiver support;Neurogenic Bowel & Bladder  Barriers to Discharge Comments: CVA, impaired initiation, neglect, apraxia, HTN, depression, incontinent of bowel and bladder Possible Resolutions to [employer]  Focus: increase amantadine to 200mg , continue anti-hypertensives, continue lexaprol, continued timed voiding   Continued Need for Acute Rehabilitation Level of Care: The patient requires daily medical management by a physician with specialized training in physical medicine and rehabilitation for the following reasons: Direction of a multidisciplinary physical rehabilitation program to maximize functional independence : Yes Medical management of patient stability for increased activity during participation in an intensive rehabilitation regime.: Yes Analysis of laboratory values and/or radiology reports with any subsequent need for medication adjustment and/or medical intervention. : Yes   I attest that I was present, lead the team conference, and concur with the assessment and plan of the team.   08/18/2020, 4:12 PM

## 2020-08-18 NOTE — Discharge Summary (Signed)
Physician Discharge Summary  Patient ID: Ellen Barajas MRN: [id number] DOB/AGE: 85/07/1928 85 y.o.  Admit date: 08/05/2020 Discharge date: 08/20/2020  Discharge Diagnoses:  Principal Problem:   Embolism of left anterior cerebral artery Active Problems:   Left middle cerebral artery stroke (HCC) Chronic atrial fibrillation Hypertension Chronic systolic congestive heart failure CAD with CABG Hyperlipidemia Multiple facial fractures Hard of hearing  Discharged Condition: Stable  Significant Diagnostic Studies: CT HEAD WO CONTRAST  Result Date: 08/06/2020 CLINICAL DATA:  87 year old female with altered mental status. EXAM: CT HEAD WITHOUT CONTRAST TECHNIQUE: Contiguous axial images were obtained from the base of the skull through the vertex without intravenous contrast. COMPARISON:  Head CT dated 08/01/2020. FINDINGS: Brain: Ill-defined area of hypodensity with loss of gray-white matter discrimination involving the right occipital lobe new since the prior CT most consistent with an infarct involving the right PCA territory, likely acute or subacute. Clinical correlation and further evaluation with MRI is recommended. Left ACA territory infarct as seen on the prior CT. There is mild age-related atrophy and chronic microvascular ischemic changes. There is no acute intracranial hemorrhage. No mass effect or midline shift no extra-axial fluid collection. Vascular: No hyperdense vessel or unexpected calcification. Skull: Normal. Negative for fracture or focal lesion. Sinuses/Orbits: Mildly displaced fractures of the anterior and posterolateral walls of the right maxillary sinus with right maxillary hemosinus. There is also mildly depressed and angulated fracture of the right zygomatic arch. Correlation with history of recent fall or trauma recommended. Other: None IMPRESSION: 1. No acute intracranial hemorrhage. 2. Right PCA territory infarct, likely acute or subacute. Clinical correlation and further  evaluation with MRI is recommended. 3. Left ACA territory infarct as seen on the prior CT. 4. Mildly displaced fractures of the anterior and posterolateral walls of the right maxillary sinus and right zygomatic arch. Correlation with history of recent fall or trauma recommended. These results were called by telephone at the time of interpretation on 08/06/2020 at 1:54 am to the patient's nurse, Peggye Pittichards, who verbally acknowledged these results. Electronically Signed   By: Elgie CollardArash  Radparvar M.D.   On: 08/06/2020 01:55   CT Head Wo Contrast  Result Date: 07/31/2020 CLINICAL DATA:  Fall today with facial trauma. EXAM: CT HEAD WITHOUT CONTRAST CT MAXILLOFACIAL WITHOUT CONTRAST CT CERVICAL SPINE WITHOUT CONTRAST TECHNIQUE: Multidetector CT imaging of the head, cervical spine, and maxillofacial structures were performed using the standard protocol without intravenous contrast. Multiplanar CT image reconstructions of the cervical spine and maxillofacial structures were also generated. COMPARISON:  None. FINDINGS: CT HEAD FINDINGS Brain: There is no evidence of acute intracranial hemorrhage, mass lesion, brain edema or extra-axial fluid collection. Mild-to-moderate atrophy with prominence of the ventricles and subarachnoid spaces. There are patchy small vessel ischemic changes in the periventricular white matter and basal ganglia bilaterally. There is no CT evidence of acute cortical infarction. Vascular: Intracranial vascular calcifications. No hyperdense vessel identified. Skull: Negative for fracture or focal lesion. Other: None. CT MAXILLOFACIAL FINDINGS Osseous: There are acute, mildly displaced fractures of the anterior and posterolateral walls of the right maxillary sinus with associated layering blood in the sinus. There is also a mildly comminuted and depressed fracture of the right zygomatic arch. Possible minimal diastasis at the right frontozygomatic suture. No evidence of orbital or other facial fracture. The  mandible and temporomandibular joints are intact. There are moderate TMJ degenerative changes bilaterally. Orbits: No evidence of orbital fracture. The globes are intact. Previous lens surgery and scleral banding bilaterally. No evidence of orbital hematoma.  The extraocular muscles and optic nerves appear normal. Sinuses: Layering blood in the right maxillary sinus. The additional paranasal sinuses are clear without air-fluid levels. The mastoid air cells and middle ears are clear. Soft tissues: Soft tissue swelling over the right cheek and malar region without focal hematoma or foreign body. CT CERVICAL SPINE FINDINGS Alignment: Straightening with minimal retrolisthesis at C5-6 and anterolisthesis at C7-T1 and T1-2. Skull base and vertebrae: No evidence of acute fracture or traumatic subluxation. Soft tissues and spinal canal: Mild ossification of the ligamentum nuchae. No prevertebral fluid or swelling. No visible canal hematoma. Disc levels: Multilevel mild spondylosis for age, with disc space narrowing and uncinate spurring greatest at C5-6. Scattered facet degenerative changes. Upper chest: Bilateral carotid atherosclerosis. Other: None. IMPRESSION: 1. Mildly displaced fractures of the right zygomatic arch, anterior and posterolateral walls of the right maxillary sinus and possible mild diastasis of the right frontozygomatic suture (tripod fracture variant). No other facial fractures. 2. Overlying right facial soft tissue swelling without orbital hematoma or foreign body. 3. No acute intracranial or calvarial findings. Mild atrophy and chronic small vessel ischemic changes. 4. No evidence of acute cervical spine fracture, traumatic subluxation or static signs of instability. Multilevel cervical spondylosis. Electronically Signed   By: Carey Bullocks M.D.   On: 07/31/2020 14:22   CT Cervical Spine Wo Contrast  Result Date: 07/31/2020 CLINICAL DATA:  Fall today with facial trauma. EXAM: CT HEAD WITHOUT  CONTRAST CT MAXILLOFACIAL WITHOUT CONTRAST CT CERVICAL SPINE WITHOUT CONTRAST TECHNIQUE: Multidetector CT imaging of the head, cervical spine, and maxillofacial structures were performed using the standard protocol without intravenous contrast. Multiplanar CT image reconstructions of the cervical spine and maxillofacial structures were also generated. COMPARISON:  None. FINDINGS: CT HEAD FINDINGS Brain: There is no evidence of acute intracranial hemorrhage, mass lesion, brain edema or extra-axial fluid collection. Mild-to-moderate atrophy with prominence of the ventricles and subarachnoid spaces. There are patchy small vessel ischemic changes in the periventricular white matter and basal ganglia bilaterally. There is no CT evidence of acute cortical infarction. Vascular: Intracranial vascular calcifications. No hyperdense vessel identified. Skull: Negative for fracture or focal lesion. Other: None. CT MAXILLOFACIAL FINDINGS Osseous: There are acute, mildly displaced fractures of the anterior and posterolateral walls of the right maxillary sinus with associated layering blood in the sinus. There is also a mildly comminuted and depressed fracture of the right zygomatic arch. Possible minimal diastasis at the right frontozygomatic suture. No evidence of orbital or other facial fracture. The mandible and temporomandibular joints are intact. There are moderate TMJ degenerative changes bilaterally. Orbits: No evidence of orbital fracture. The globes are intact. Previous lens surgery and scleral banding bilaterally. No evidence of orbital hematoma. The extraocular muscles and optic nerves appear normal. Sinuses: Layering blood in the right maxillary sinus. The additional paranasal sinuses are clear without air-fluid levels. The mastoid air cells and middle ears are clear. Soft tissues: Soft tissue swelling over the right cheek and malar region without focal hematoma or foreign body. CT CERVICAL SPINE FINDINGS Alignment:  Straightening with minimal retrolisthesis at C5-6 and anterolisthesis at C7-T1 and T1-2. Skull base and vertebrae: No evidence of acute fracture or traumatic subluxation. Soft tissues and spinal canal: Mild ossification of the ligamentum nuchae. No prevertebral fluid or swelling. No visible canal hematoma. Disc levels: Multilevel mild spondylosis for age, with disc space narrowing and uncinate spurring greatest at C5-6. Scattered facet degenerative changes. Upper chest: Bilateral carotid atherosclerosis. Other: None. IMPRESSION: 1. Mildly displaced fractures of the  right zygomatic arch, anterior and posterolateral walls of the right maxillary sinus and possible mild diastasis of the right frontozygomatic suture (tripod fracture variant). No other facial fractures. 2. Overlying right facial soft tissue swelling without orbital hematoma or foreign body. 3. No acute intracranial or calvarial findings. Mild atrophy and chronic small vessel ischemic changes. 4. No evidence of acute cervical spine fracture, traumatic subluxation or static signs of instability. Multilevel cervical spondylosis. Electronically Signed   By: Carey Bullocks M.D.   On: 07/31/2020 14:22   MR BRAIN WO CONTRAST  Result Date: 08/01/2020 CLINICAL DATA:  Acute stroke suspected EXAM: MRI HEAD WITHOUT CONTRAST TECHNIQUE: Multiplanar, multiecho pulse sequences of the brain and surrounding structures were obtained without intravenous contrast. COMPARISON:  CT and CTA from earlier today FINDINGS: Brain: Moderate to large area of cortically based infarction in the high and parasagittal left frontal lobe, best aligned to the ACA distribution. A tiny cortically based infarct is seen at the cortex of the anterior left temporal lobe. Confluent chronic small vessel ischemia in the cerebral white matter and pons. No acute hemorrhage, hydrocephalus, or masslike finding. Generalized brain atrophy. Vascular: Recent CTA.  No additional findings. Skull and upper  cervical spine: Normal marrow signal Sinuses/Orbits: Right maxillary hemosinus, known. Bilateral cataract resection. IMPRESSION: 1. Moderate to large acute left ACA branch infarct. 2. Tiny acute infarct in the left anterior temporal cortex. 3. Atrophy and extensive chronic small vessel ischemia. Electronically Signed   By: Marnee Spring M.D.   On: 08/01/2020 12:00   MR BRAIN W WO CONTRAST  Result Date: 08/06/2020 CLINICAL DATA:  87 year old female with altered mental status. Recent left ACA territory infarct on MRI. Suspected right PCA infarct on head CT tonight. EXAM: MRI HEAD WITHOUT AND WITH CONTRAST TECHNIQUE: Multiplanar, multiecho pulse sequences of the brain and surrounding structures were obtained without and with intravenous contrast. CONTRAST:  7mL GADAVIST GADOBUTROL 1 MMOL/ML IV SOLN COMPARISON:  Head CT 0140 hours today.  Brain MRI 08/01/2020. FINDINGS: Brain: Confluent new restricted diffusion in the right PCA territory, confluent involvement of the medial right occipital lobe. Right mesial temporal lobe and central right thalamic involvement. Scattered small acute bilateral cerebellar infarcts also including bilateral PICA and right SCA territory involvement (series 5, image 68). Continued confluent left ACA territory restricted diffusion, size and extent not significantly changed from 08/01/2020. Small focus of restricted diffusion at the anterior temporal lobe unchanged. Cytotoxic edema in the affected areas, most pronounced in the ACA territory and associated with multifocal new petechial hemorrhage there (series 14, image 44). No acute hemorrhage elsewhere. No significant intracranial mass effect. No midline shift. Minimal post ischemic enhancement limited to the ACA territory. Stable ventricle size and configuration. No extra-axial collection. Negative pituitary and cervicomedullary junction. No dural thickening. Stable chronic T2 heterogeneity in the bilateral cerebral white matter, deep  gray nuclei and brainstem. Vascular: Major intracranial vascular flow voids are stable, with dominant right vertebral artery. Major dural venous sinuses are enhancing following contrast and appear to be patent. Skull and upper cervical spine: Negative. Sinuses/Orbits: Stable orbits. Improved right maxillary sinus aeration. Other: Mastoids remain well aerated. IMPRESSION: 1. Confluent new Right PCA territory infarct, plus scattered small acute bilateral Cerebellar Infarcts. 2. Petechial hemorrhage has developed in the Left ACA territory infarct diagnosed on 08/01/2020. But there is no malignant hemorrhagic transformation or significant intracranial mass effect. 3. Underlying advanced small vessel disease. Electronically Signed   By: Odessa Fleming M.D.   On: 08/06/2020 04:22   DG  Pelvis Portable  Result Date: 07/31/2020 CLINICAL DATA:  Status post fall. EXAM: PORTABLE PELVIS 1-2 VIEWS COMPARISON:  None. FINDINGS: There is no evidence of pelvic fracture or diastasis. Fixation hardware for healed bilateral intertrochanteric fractures noted. No pelvic bone lesions are seen. Lower lumbar spondylosis. IMPRESSION: No acute abnormality. Electronically Signed   By: Drusilla Kanner M.D.   On: 07/31/2020 15:12   CT CEREBRAL PERFUSION W CONTRAST  Result Date: 08/01/2020 CLINICAL DATA:  Stroke suspected EXAM: CT PERFUSION BRAIN TECHNIQUE: Multiphase CT imaging of the brain was performed following IV bolus contrast injection. Subsequent parametric perfusion maps were calculated using RAPID software. CONTRAST:  75mL OMNIPAQUE IOHEXOL 350 MG/ML SOLN COMPARISON:  Head CT and CTA from earlier today FINDINGS: CT Brain Perfusion Findings: CBF (<30%) Volume: 0mL Perfusion (Tmax>6.0s) volume: 0mL IMPRESSION: No core infarct or cerebral ischemia by standard perfusion thresholds. Electronically Signed   By: Marnee Spring M.D.   On: 08/01/2020 05:13   DG Chest Port 1 View  Result Date: 07/31/2020 CLINICAL DATA:  Status post fall. EXAM:  PORTABLE CHEST 1 VIEW COMPARISON:  Single-view of the chest 11/21/2018. FINDINGS: The patient is status post CABG. There is cardiomegaly. Aortic atherosclerosis. Lungs clear. No pneumothorax or pleural fluid. No acute or focal bony abnormality. IMPRESSION: No acute disease. Electronically Signed   By: Drusilla Kanner M.D.   On: 07/31/2020 15:13   ECHOCARDIOGRAM COMPLETE  Result Date: 08/01/2020    ECHOCARDIOGRAM REPORT   Patient Name:   Ellen Barajas Date of Exam: 08/01/2020 Medical Rec #:  [id number]    Height:       63.0 in Accession #:    [id number]   Weight:       150.8 lb Date of Birth:  [birth year]     BSA:          1.715 m Patient Age:    89 years     BP:           151/90 mmHg Patient Gender: F            HR:           88 bpm. Exam Location:  Inpatient Procedure: 2D Echo, Cardiac Doppler and Color Doppler Indications:    NSTEMI I21.4  History:        Patient has prior history of Echocardiogram examinations, most                 recent 10/14/2019. CAD and Previous Myocardial Infarction, Prior                 CABG, Arrythmias:Atrial Fibrillation; Risk Factors:Dyslipidemia.  Sonographer:    Renella Cunas RDCS Referring Phys: [id number] ALLISON WOLFE IMPRESSIONS  1. Left ventricular ejection fraction, by estimation, is 25 to 30%. The left ventricle has severely decreased function. The left ventricle demonstrates regional wall motion abnormalities (see scoring diagram/findings for description). The left ventricular internal cavity size was mildly dilated. Left ventricular diastolic parameters are indeterminate.  2. Right ventricular systolic function is mildly reduced. The right ventricular size is normal. There is severely elevated pulmonary artery systolic pressure. The estimated right ventricular systolic pressure is 71.6 mmHg.  3. Left atrial size was mildly dilated.  4. The mitral valve is abnormal. Mild to moderate mitral valve regurgitation. The mean mitral valve gradient is 2.4 mmHg with average heart rate of 91  bpm. Severe mitral annular calcification.  5. Tricuspid valve regurgitation is mild to moderate.  6. The inferior vena cava is dilated in  size with <50% respiratory variability, suggesting right atrial pressure of 15 mmHg.  7. The aortic valve is tricuspid. Aortic valve regurgitation is mild to moderate. Aortic regurgitation PHT measures 450 msec. Aortic valve mean gradient measures 11.0 mmHg. Low Flow Low Gradient Aortic Stenosis suspected, in the setting of diminished stroke volume index and valve morphology. Comparison(s): A prior study was performed on 10/14/2019. Prior images reviewed side by side. Significant changes in LVEF have occurred. FINDINGS  Left Ventricle: Left ventricular ejection fraction, by estimation, is 25 to 30%. The left ventricle has severely decreased function. The left ventricle demonstrates regional wall motion abnormalities. The left ventricular internal cavity size was mildly  dilated. There is no left ventricular hypertrophy. Left ventricular diastolic parameters are indeterminate.  LV Wall Scoring: The anterior wall, antero-lateral wall, and posterior wall are hypokinetic. Right Ventricle: The right ventricular size is normal. No increase in right ventricular wall thickness. Right ventricular systolic function is mildly reduced. There is severely elevated pulmonary artery systolic pressure. The tricuspid regurgitant velocity is 3.76 m/s, and with an assumed right atrial pressure of 15 mmHg, the estimated right ventricular systolic pressure is 71.6 mmHg. Left Atrium: Left atrial size was mildly dilated. Right Atrium: Right atrial size was normal in size. Pericardium: Trivial pericardial effusion is present. Mitral Valve: The mitral valve is abnormal. Severe mitral annular calcification. Mild to moderate mitral valve regurgitation. The mean mitral valve gradient is 2.4 mmHg with average heart rate of 91 bpm. Tricuspid Valve: The tricuspid valve is grossly normal. Tricuspid valve  regurgitation is mild to moderate. Aortic Valve: The aortic valve is tricuspid. Aortic valve regurgitation is mild to moderate. Aortic regurgitation PHT measures 450 msec. Aortic valve mean gradient measures 11.0 mmHg. Aortic valve peak gradient measures 20.2 mmHg. Aortic valve area, by VTI measures 0.88 cm. Pulmonic Valve: The pulmonic valve was not well visualized. Pulmonic valve regurgitation is mild. No evidence of pulmonic stenosis. Aorta: The aortic root is normal in size and structure. Venous: The inferior vena cava is dilated in size with less than 50% respiratory variability, suggesting right atrial pressure of 15 mmHg. IAS/Shunts: The atrial septum is grossly normal.  LEFT VENTRICLE PLAX 2D LVIDd:         5.70 cm LVIDs:         4.90 cm LV PW:         0.80 cm LV IVS:        0.80 cm LVOT diam:     2.00 cm LV SV:         41 LV SV Index:   24 LVOT Area:     3.14 cm  LV Volumes (MOD) LV vol d, MOD A2C: 128.0 ml LV vol d, MOD A4C: 123.0 ml LV vol s, MOD A2C: 95.6 ml LV vol s, MOD A4C: 85.6 ml LV SV MOD A2C:     32.4 ml LV SV MOD A4C:     123.0 ml LV SV MOD BP:      33.2 ml RIGHT VENTRICLE RV S prime:     6.08 cm/s TAPSE (M-mode): 1.1 cm LEFT ATRIUM             Index       RIGHT ATRIUM           Index LA diam:        3.90 cm 2.27 cm/m  RA Area:     16.50 cm LA Vol (A2C):   52.0 ml 30.32 ml/m RA Volume:   41.50  ml  24.20 ml/m LA Vol (A4C):   52.6 ml 30.67 ml/m LA Biplane Vol: 57.0 ml 33.24 ml/m  AORTIC VALVE AV Area (Vmax):    0.95 cm AV Area (Vmean):   0.89 cm AV Area (VTI):     0.88 cm AV Vmax:           225.00 cm/s AV Vmean:          151.500 cm/s AV VTI:            0.464 m AV Peak Grad:      20.2 mmHg AV Mean Grad:      11.0 mmHg LVOT Vmax:         67.80 cm/s LVOT Vmean:        42.900 cm/s LVOT VTI:          0.130 m LVOT/AV VTI ratio: 0.28 AI PHT:            450 msec  AORTA Ao Root diam: 3.30 cm Ao Asc diam:  3.30 cm MITRAL VALVE              TRICUSPID VALVE MV Mean grad: 2.4 mmHg    TR Peak grad:    56.6 mmHg MR Peak grad: 122.3 mmHg  TR Vmax:        376.00 cm/s MR Mean grad: 76.0 mmHg MR Vmax:      553.00 cm/s SHUNTS MR Vmean:     408.0 cm/s  Systemic VTI:  0.13 m                           Systemic Diam: 2.00 cm Riley Lam MD Electronically signed by Riley Lam MD Signature Date/Time: 08/01/2020/1:52:37 PM    Final    CT HEAD CODE STROKE WO CONTRAST  Result Date: 08/01/2020 CLINICAL DATA:  Code stroke.  Left MCA syndrome EXAM: CT HEAD WITHOUT CONTRAST TECHNIQUE: Contiguous axial images were obtained from the base of the skull through the vertex without intravenous contrast. COMPARISON:  07/31/2020 FINDINGS: Brain: Acute infarct in the parasagittal left frontal lobe involving a moderate area, left ACA territory. No MCA territory infarct is seen. Chronic small vessel ischemia in the white matter. Cerebral volume loss with ventriculomegaly. No acute hemorrhage Vascular: No hyperdense vessel Skull: Negative for fracture Sinuses/Orbits: Right facial fractures as described on preceding face CT. Other: These results were called by telephone at the time of interpretation on 08/01/2020 at 4:49 am to provider [facility] , who verbally acknowledged these results. ASPECTS [facility] Stroke Program Early CT Score) - Ganglionic level infarction (caudate, lentiform nuclei, internal capsule, insula, M1-M3 cortex): 7 - Supraganglionic infarction (M4-M6 cortex): 3 Total score (0-10 with 10 being normal): 10 IMPRESSION: 1. Moderate acute infarct in the parasagittal left frontal lobe, ACA territory. 2. Aging brain. Electronically Signed   By: Marnee Spring M.D.   On: 08/01/2020 04:56   CT ANGIO HEAD CODE STROKE  Result Date: 08/01/2020 CLINICAL DATA:  Acute stroke EXAM: CT ANGIOGRAPHY HEAD AND NECK TECHNIQUE: Multidetector CT imaging of the head and neck was performed using the standard protocol during bolus administration of intravenous contrast. Multiplanar CT image reconstructions and MIPs were  obtained to evaluate the vascular anatomy. Carotid stenosis measurements (when applicable) are obtained utilizing NASCET criteria, using the distal internal carotid diameter as the denominator. CONTRAST:  26mL OMNIPAQUE IOHEXOL 350 MG/ML SOLN COMPARISON:  None available FINDINGS: CTA NECK FINDINGS Aortic arch: Atheromatous plaque with 3 vessel branching. Brachiocephalic  artery origin is not covered. Right carotid system: Diffuse atheromatous wall thickening of the common carotid extending to the bifurcation. No flow limiting stenosis or ulceration. Left carotid system: Diffuse atheromatous plaque of mixed density involving the common carotid and extending into the proximal ICA. No flow limiting stenosis or ulceration Vertebral arteries: Proximal subclavian atherosclerosis on both sides without flow reducing stenosis. Although limited by motion, there is high-grade left V1 segment stenosis. Even worse stenosis in the left V3 segment with no flow by the dura and reconstitution serving the left PICA from the basilar. Dominant right vertebral artery is diffusely patent Skeleton: Recent dedicated cervical spine and facial CT. No interval findings. Other neck: No acute finding. Upper chest: Small pleural effusions. Vague nodular density in the left upper lobe with hazy margins, favoring a inflammatory process Review of the MIP images confirms the above findings CTA HEAD FINDINGS Anterior circulation: Left MCA type symptoms. There is intermittent severe narrowing with flow gap involving the left M2 and M3 branches, but flow is seen beyond the major vessels there is actually even worse stenosis at the right M1 segment with accentuated lenticulostriate and downstream reconstitution. Bilateral ACA atherosclerosis with especially right A2 segment narrowing. 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 Posterior circulation: Dominant right vertebral artery shows diffuse atheromatous plaque with no flow  limiting stenosis. Reconstituted distal left V4 segment serving the left PICA. The basilar is smooth and diffusely patent. Atheromatous irregularity of the bilateral PCA beyond the P2 segments. Venous sinuses: Unremarkable in the arterial phase Anatomic variants: None significant Review of the MIP images confirms the above findings IMPRESSION: 1. Negative for large vessel occlusion. 2. Advanced intracranial atherosclerosis. Most notable stenoses are at the right M1 and left M2 segments. 3. 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 4. 2 mm A-comm aneurysm. Electronically Signed   By: Marnee Spring M.D.   On: 08/01/2020 05:05   CT ANGIO NECK CODE STROKE  Result Date: 08/01/2020 CLINICAL DATA:  Acute stroke EXAM: CT ANGIOGRAPHY HEAD AND NECK TECHNIQUE: Multidetector CT imaging of the head and neck was performed using the standard protocol during bolus administration of intravenous contrast. Multiplanar CT image reconstructions and MIPs were obtained to evaluate the vascular anatomy. Carotid stenosis measurements (when applicable) are obtained utilizing NASCET criteria, using the distal internal carotid diameter as the denominator. CONTRAST:  75mL OMNIPAQUE IOHEXOL 350 MG/ML SOLN COMPARISON:  None available FINDINGS: CTA NECK FINDINGS Aortic arch: Atheromatous plaque with 3 vessel branching. Brachiocephalic artery origin is not covered. Right carotid system: Diffuse atheromatous wall thickening of the common carotid extending to the bifurcation. No flow limiting stenosis or ulceration. Left carotid system: Diffuse atheromatous plaque of mixed density involving the common carotid and extending into the proximal ICA. No flow limiting stenosis or ulceration Vertebral arteries: Proximal subclavian atherosclerosis on both sides without flow reducing stenosis. Although limited by motion, there is high-grade left V1  segment stenosis. Even worse stenosis in the left V3 segment with no flow by the dura and reconstitution serving the left PICA from the basilar. Dominant right vertebral artery is diffusely patent Skeleton: Recent dedicated cervical spine and facial CT. No interval findings. Other neck: No acute finding. Upper chest: Small pleural effusions. Vague nodular density in the left upper lobe with hazy margins, favoring a inflammatory process Review of the MIP images confirms the above findings CTA HEAD FINDINGS Anterior circulation: Left  MCA type symptoms. There is intermittent severe narrowing with flow gap involving the left M2 and M3 branches, but flow is seen beyond the major vessels there is actually even worse stenosis at the right M1 segment with accentuated lenticulostriate and downstream reconstitution. Bilateral ACA atherosclerosis with especially right A2 segment narrowing. 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 Posterior circulation: Dominant right vertebral artery shows diffuse atheromatous plaque with no flow limiting stenosis. Reconstituted distal left V4 segment serving the left PICA. The basilar is smooth and diffusely patent. Atheromatous irregularity of the bilateral PCA beyond the P2 segments. Venous sinuses: Unremarkable in the arterial phase Anatomic variants: None significant Review of the MIP images confirms the above findings IMPRESSION: 1. Negative for large vessel occlusion. 2. Advanced intracranial atherosclerosis. Most notable stenoses are at the right M1 and left M2 segments. 3. 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 4. 2 mm A-comm aneurysm. Electronically Signed   By: Marnee Spring M.D.   On: 08/01/2020 05:05   CT Maxillofacial WO CM  Result Date: 07/31/2020 CLINICAL DATA:  Fall today with facial trauma. EXAM: CT HEAD WITHOUT CONTRAST CT MAXILLOFACIAL WITHOUT  CONTRAST CT CERVICAL SPINE WITHOUT CONTRAST TECHNIQUE: Multidetector CT imaging of the head, cervical spine, and maxillofacial structures were performed using the standard protocol without intravenous contrast. Multiplanar CT image reconstructions of the cervical spine and maxillofacial structures were also generated. COMPARISON:  None. FINDINGS: CT HEAD FINDINGS Brain: There is no evidence of acute intracranial hemorrhage, mass lesion, brain edema or extra-axial fluid collection. Mild-to-moderate atrophy with prominence of the ventricles and subarachnoid spaces. There are patchy small vessel ischemic changes in the periventricular white matter and basal ganglia bilaterally. There is no CT evidence of acute cortical infarction. Vascular: Intracranial vascular calcifications. No hyperdense vessel identified. Skull: Negative for fracture or focal lesion. Other: None. CT MAXILLOFACIAL FINDINGS Osseous: There are acute, mildly displaced fractures of the anterior and posterolateral walls of the right maxillary sinus with associated layering blood in the sinus. There is also a mildly comminuted and depressed fracture of the right zygomatic arch. Possible minimal diastasis at the right frontozygomatic suture. No evidence of orbital or other facial fracture. The mandible and temporomandibular joints are intact. There are moderate TMJ degenerative changes bilaterally. Orbits: No evidence of orbital fracture. The globes are intact. Previous lens surgery and scleral banding bilaterally. No evidence of orbital hematoma. The extraocular muscles and optic nerves appear normal. Sinuses: Layering blood in the right maxillary sinus. The additional paranasal sinuses are clear without air-fluid levels. The mastoid air cells and middle ears are clear. Soft tissues: Soft tissue swelling over the right cheek and malar region without focal hematoma or foreign body. CT CERVICAL SPINE FINDINGS Alignment: Straightening with minimal  retrolisthesis at C5-6 and anterolisthesis at C7-T1 and T1-2. Skull base and vertebrae: No evidence of acute fracture or traumatic subluxation. Soft tissues and spinal canal: Mild ossification of the ligamentum nuchae. No prevertebral fluid or swelling. No visible canal hematoma. Disc levels: Multilevel mild spondylosis for age, with disc space narrowing and uncinate spurring greatest at C5-6. Scattered facet degenerative changes. Upper chest: Bilateral carotid atherosclerosis. Other: None. IMPRESSION: 1. Mildly displaced fractures of the right zygomatic arch, anterior and posterolateral walls of the right maxillary sinus and possible mild diastasis of the right frontozygomatic suture (tripod fracture variant). No other facial fractures. 2. Overlying right facial soft tissue swelling without orbital  hematoma or foreign body. 3. No acute intracranial or calvarial findings. Mild atrophy and chronic small vessel ischemic changes. 4. No evidence of acute cervical spine fracture, traumatic subluxation or static signs of instability. Multilevel cervical spondylosis. Electronically Signed   By: Carey Bullocks M.D.   On: 07/31/2020 14:22    Labs:  Basic Metabolic Panel: No results for input(s): NA, K, CL, CO2, GLUCOSE, BUN, CREATININE, CALCIUM, MG, PHOS in the last 168 hours.  CBC: No results for input(s): WBC, NEUTROABS, HGB, HCT, MCV, PLT in the last 168 hours.  CBG: No results for input(s): GLUCAP in the last 168 hours.  Family history.  Mother and father with CAD.  Denies any colon cancer esophageal cancer or rectal cancer  Brief HPI:   Ellen Barajas is a 87 y.o. right-handed female with history of atrial fibrillation on Xarelto, CAD with CABG 2002, postherpetic neuralgia hypertension hyperlipidemia as well as hip fracture 2020.  Per chart review lives alone independent with assistive device.  She does not drive.  Niece and family assist as needed.  Presented 07/31/2020 after being found down with acute  onset of right-sided weakness facial droop and aphasia.  She did have bruising to the right side of her face and eye.  Cranial CT scan negative for acute changes.  CT cervical spine as well as maxillofacial showed mildly displaced fractures of the right zygomatic arch anterior and posterior lateral walls of the right maxillary sinus and possible diastasis of the right frontal zygomatic suture and conservative care was recommended and placed on empiric Keflex per ENT.  No other facial fractures.  Overlying right facial soft tissue swelling without orbital hematoma or foreign body.  CT cervical spine negative.  CT angiogram head and neck negative for large vessel occlusion.  There was a 2 mm A-comm aneurysm.  Patient did not receive tPA.  MRI identified moderate to large acute left ACA branch infarction.  Tiny acute infarction in the left anterior temporal cortex.  Echocardiogram with ejection fraction of 25 to 30%.  Left ventricle had severely decreased function.  Admission chemistries unremarkable except creatinine 1.05 troponin [id number], CK7 31 lactic acid 1.9.  Cardiology services consulted for elevated troponin.  EKG overall unchanged from previous tracing and no ischemic changes.  It was felt elevated troponin in the setting of fall and being down for extended amount of time.  Patient was cleared to continue chronic Xarelto and Plavix was added for history of CAD/non-STEMI.  No further work-up at that time was indicated.  Close monitoring of heart rate with history of atrial fibrillation and adjustments of beta-blocker.  Therapy evaluations completed due to patient's right side weakness and aphasia was admitted for a comprehensive rehab program.   Hospital Course: Carinne Brandenburger Mckercher was admitted to rehab 08/05/2020 for inpatient therapies to consist of PT, ST and OT at least three hours five days a week. Past admission physiatrist, therapy team and rehab RN have worked together to provide customized collaborative  inpatient rehab.  Pertaining to patient's moderate size left ACA infarction 08/01/2020 maintained on Xarelto and Plavix.  Upon admission to rehab services a cranial CT scan was completed 08/06/2020 due to some altered mental status that did show right PCA territory infarction likely acute or subacute.  Left ACA territory infarct as seen on prior CT.  Neurology was notified advised continue Xarelto and Plavix as indicated.  Patient with noted decrease initiation trial of amantadine initiated.  Patient with chronic atrial fibrillation cardiology service follow-up routine  follow-up daily EKGs stable.  Blood pressure soft and monitored on present regimen with adjustments per cardiology services on beta-blocker.  She exhibited no other signs of fluid overload.  She did have a history of CAD with CABG denied any chest pain.  Lipitor for hyperlipidemia.  In regards to patient's multiple facial fractures conservative care by ENT Dr.Juengel.  She did have some bowel bladder incontinence likely cognitive/awareness with timed toileting provided.   Blood pressures were monitored on TID basis and soft and monitored    Rehab course: During patient's stay in rehab weekly team conferences were held to monitor patient's progress, set goals and discuss barriers to discharge. At admission, patient required min assist sit to supine mod assist sit to side-lying minimal assist stand pivot transfers 2 person hand-held assist 8 feet  Physical exam.  Blood pressure 129/77 pulse 105 temperature 97.7 respirations 18 oxygen saturation 99% room air Constitutional.  Alert made eye contact with examiner patient was aphasic HEENT Head.  Normocephalic and atraumatic Eyes.  Pupils round and reactive to light no discharge without nystagmus Neck.  Supple nontender no JVD without thyromegaly Cardiac irregular irregular Abdomen.  Soft nontender positive bowel sounds without rebound Respiratory effort normal no respiratory distress without  wheeze Neurologic.  Cranial nerves II through XII intact motor strength not tested due to severe aphasia and apraxia.  Spontaneous movement left upper left lower extremity but not on right side.  Sensory exam difficult again due to aphasia.  He/She  has had improvement in activity tolerance, balance, postural control as well as ability to compensate for deficits. He/She has had improvement in functional use RUE/LUE  and RLE/LLE as well as improvement in awareness.  Patient with slow overall progress.  Worked on sit to stand in parallel bars required minimal assist for powering to rise and moderate assist for balance.  Attempted to initiate functional gait training but unable to initiate right side movement and right leg showing extensor tone.  Patient would become easily emotional and frustrated with difficulty getting out her words due to her aphasia.  She was able to nod her head yes for simple questions.  Stand pivot with moderate assist back to bed.  At the sink max assist and max cues for oral care upper body bathing and brushing hair.  Very minimal initiation with some perseverative behavior.  Speech therapy follow-up for dysphagia her diet was downgraded to a dysphagia #2 with thin liquids.  Patient with slow overall progress discussed with family palliative care consulted with hospice consult obtained and plan was to be discharged to home with hospice care.       Disposition: Discharge disposition: 01-Home or Self Care      Discharged to home   Diet: Dysphagia #2 thin liquids  Special Instructions: No smoking or alcohol  Medications at discharge to be initiated per hospice care.  30-35 minutes were spent completing discharge summary and discharge planning  Discharge Instructions     Ambulatory referral to Neurology   Complete by: As directed    An appointment is requested in approximately: 4 weeks left MCA infarction        Follow-up Information     Nahser, Deloris Ping, MD  Follow up.   Specialty: Cardiology Why: Call for appointment Contact information: [address] [place] [address] [phone]         Horton Chin, MD Follow up.   Specialty: Physical Medicine and Rehabilitation Why: NO FOLLOW UP PLANNED HOSPICE  Contact information: [address] [place] [address] [phone]                 Signed: Mcarthur Rossetti Hermen Mario 08/20/2020, 5:30 AM

## 2020-08-19 NOTE — Progress Notes (Signed)
Inpatient Rehabilitation Care Coordinator Discharge Note  The overall goal for the admission was met for:   Discharge location: [place] 24/7  HIRED CAREGIVERS  Length of Stay: Yes-15 DAYS  Discharge activity level: No-MOD/MAX LEVEL  Home/community participation: No  Services provided included: MD, RD, PT, OT, SLP, RN, CM, Pharmacy, and SW  Financial Services: Private Insurance: BLUE MEDICARE  Choices offered to/list presented to:PT AND NIECE  Follow-up services arranged: Other: [facility] and Patient/Family request agency HH: PREF, DME: [facility] BED NO OTHER NEEDS-HAS FROM PREVIOUS ADMISSIONS  Comments (or additional information):PT EXTENDED HER STROKE AND CHOSE TO [place] AND BE DNR. PT WILL HAVE 24/7 HIRED CARE AT [facility] TO OVERSEE THIS  Patient/Family verbalized understanding of follow-up arrangements: Yes  Individual responsible for coordination of the follow-up plan: NIECE-POA-[name] [phone]  Confirmed correct DME delivered: Elease Hashimoto 08/19/2020    Vishal Sandlin, Gardiner Rhyme

## 2020-08-19 NOTE — Progress Notes (Signed)
Occupational Therapy Discharge Summary  Patient Details  Name: Ellen Barajas MRN: [id number] Date of Birth: 1928-05-06     Patient has met 9 of 10 long term goals due to improved balance and postural control.  Patient to discharge at overall Max Assist to total A level.  Goals initially set at min A but were downgraded to max -total A due to lack of progress.  Patient's care partner is independent to provide the necessary physical and cognitive assistance at discharge.  Pt is discharging home to Hospice care.   Reasons goals not met: Pt needs max vs mod A with self feeding.   Recommendation:  Hospice care  Equipment: No equipment provided  Reasons for discharge: discharge from hospital and pt is going home to [facility]  Patient/family agrees with progress made and goals achieved: Yes  OT Discharge Precautions/Restrictions  Precautions Precautions: Fall Precaution Comments: aphasia, very hard of hearing (lost L hearing aid), L inattention, R neglect, L visual field deficit, bowel/bladder incontinence, R LE extensor tone Restrictions Weight Bearing Restrictions: No ADL ADL Eating: Maximal assistance Grooming: Maximal assistance Where Assessed-Grooming: Sitting at sink Upper Body Bathing: Maximal assistance Where Assessed-Upper Body Bathing: Sitting at sink Lower Body Bathing: Dependent Where Assessed-Lower Body Bathing: Sitting at sink Upper Body Dressing: Maximal assistance, Maximal cueing Where Assessed-Upper Body Dressing: Sitting at sink Lower Body Dressing: Dependent, Maximal cueing Where Assessed-Lower Body Dressing: Bed level Toileting: Dependent, Maximal cueing Where Assessed-Toileting: Glass blower/designer: Maximal Print production planner Method: Engineer, water: Energy manager Method: Unable to assess Health Net Method: Unable to assess Vision Baseline Vision/History: No visual deficits (unable to  confirm 2/2 aphasia) Patient Visual Report:  (difficult to assess due to aphasia, delayed processing. Functionally, appears to have L visual field cut) Vision Assessment?: Yes Eye Alignment: Impaired (comment) Ocular Range of Motion: Restricted on the left;Impaired-to be further tested in functional context Alignment/Gaze Preference: Head turned;Gaze right Tracking/Visual Pursuits: Impaired - to be further tested in functional context;Requires cues, head turns, or add eye shifts to track;Decreased smoothness of horizontal tracking;Left eye does not track laterally;Right eye does not track medially Visual Fields: Left visual field deficit Perception  Inattention/Neglect: Does not attend to left visual field;Does not attend to right side of body;Impaired-to be further tested in functional context Praxis Praxis: Impaired Praxis Impairment Details: Initiation;Motor planning;Perseveration;Ideation;Ideomotor Cognition Overall Cognitive Status: Impaired/Different from baseline Arousal/Alertness: Awake/alert Orientation Level: Oriented to person Attention: Sustained;Focused Focused Attention: Impaired Focused Attention Impairment: Verbal basic;Functional basic Sustained Attention: Impaired Sustained Attention Impairment: Verbal basic;Functional basic Awareness: Impaired Awareness Impairment: Intellectual impairment Problem Solving: Impaired Problem Solving Impairment: Verbal basic;Functional basic Sequencing: Impaired Sequencing Impairment: Functional basic Initiating: Impaired Initiating Impairment: Verbal basic;Functional basic Safety/Judgment: Impaired Sensation Sensation Light Touch: Impaired by gross assessment Hot/Cold: Not tested Proprioception: Impaired by gross assessment Stereognosis: Not tested Additional Comments: Unable to accurately assess due to aphasia and cog deficits Coordination Gross Motor Movements are Fluid and Coordinated: No Fine Motor Movements are Fluid and  Coordinated: No Coordination and Movement Description: Absent to very delayed initiation and processing, movement patterns impacted by RLE extensor tone Heel Shin Test: UTA due to weakness and aphasia Motor  Motor Motor: Hemiplegia;Abnormal tone;Abnormal postural alignment and control;Motor impersistence;Motor apraxia Motor - Skilled Clinical Observations: R hemi Mobility  Bed Mobility Bed Mobility: Rolling Right;Rolling Left;Supine to Sit;Sit to Supine Rolling Right: Total Assistance - Patient < 25% Rolling Left: Total Assistance - Patient < 25% Supine to Sit: Maximal Assistance -  Patient - Patient 25-49% Sit to Supine: Total Assistance - Patient < 25% Transfers Sit to Stand: Maximal Assistance - Patient 25-49% Stand to Sit: Maximal Assistance - Patient 25-49%  Trunk/Postural Assessment  Cervical Assessment Cervical Assessment: Within Functional Limits Thoracic Assessment Thoracic Assessment: Exceptions to [facility] (rounded shoulders) Lumbar Assessment Lumbar Assessment: Exceptions to [facility] (posterior pelvic tilt) Postural Control Postural Control: Deficits on evaluation (delayed and inadequate)  Balance Balance Balance Assessed: Yes Static Sitting Balance Static Sitting - Balance Support: Feet supported;No upper extremity supported Static Sitting - Level of Assistance: 4: Min assist Dynamic Sitting Balance Dynamic Sitting - Balance Support: Feet unsupported;During functional activity Dynamic Sitting - Level of Assistance: 2: Max Insurance risk surveyor Standing - Balance Support: No upper extremity supported Static Standing - Level of Assistance: 2: Max assist Dynamic Standing Balance Dynamic Standing - Balance Support: During functional activity;Bilateral upper extremity supported Dynamic Standing - Level of Assistance: 1: +1 Total assist Extremity/Trunk Assessment RUE Assessment Active Range of Motion (AROM) Comments: Only active motion noted right shoulder  extension and digital flexion General Strength Comments: Difficult to assess due to right sided neglect and apraxia Brunstrum level for arm: Stage II Synergy is developing Brunstrum level for hand: Stage III Synergies performed voluntarily LUE Assessment Active Range of Motion (AROM) Comments: WFL General Strength Comments: difficult to assess due to apraxia   Royalti Schauf 08/19/2020, 12:41 PM

## 2020-08-19 NOTE — Progress Notes (Signed)
Civil engineer, contracting [facility]) Hospital Liaison: RN note     Chart and patient information have been reviewed by [facility] physician. Hospice eligibility confirmed.     Spoke with [name], PCG and niece, to confirm DME delivered and in place.  [name] asks that pt be discharged as early as possible tomorrow; Maureen Ralphs made aware and PTAR scheduled now for 9am tomorrow.  ACC planning an admission visit at 9:30am on Friday.  Contact number given to [facility]; all questions answered at this time.  Thank you for the opportunity to participate in this pt's care.  Gillian Scarce      [facility] Liaison   [phone] (24h on call)

## 2020-08-19 NOTE — Progress Notes (Signed)
Occupational Therapy Session Note  Patient Details  Name: Tru D Fredenburg MRN: [id number] Date of Birth: 11/27/1928  Today's Date: 08/19/2020 OT Individual Time: 1130-1200 OT Individual Time Calculation (min): 30 min    Short Term Goals: Week 1:  OT Short Term Goal 1 (Week 1): Pt will complete UB dressing with mod assist using hemi techniques OT Short Term Goal 1 - Progress (Week 1): Not met OT Short Term Goal 2 (Week 1): Pt will complete sit<>stand with min assist in preperation for LB dressing and bathing. OT Short Term Goal 2 - Progress (Week 1): Not met OT Short Term Goal 3 (Week 1): Pt will scan to left visual field to retrieve items from sink with mod cues. OT Short Term Goal 3 - Progress (Week 1): Not met OT Short Term Goal 4 (Week 1): Pt will complete toilet transfer with mod assist. OT Short Term Goal 4 - Progress (Week 1): Not met  Skilled Therapeutic Interventions/Progress Updates:    Pt received in wc at nursing station, she vocalized "I dont know what to do!" Pt appeared distressed so took her back to her room to engage her in self care.  Due to limited praxis, initiation, attention pt continues to require max to total A with self care.  Pt had become more relaxed.  Tried to have pt use the toilet but she kept saying "I dont need to sit on the toilet".  Max A squat pivot to bed. Pt adjusted in bed with all needs met.  Bed alarm set.    Therapy Documentation Precautions:  Precautions Precautions: Fall Precaution Comments: aphasia, very hard of hearing (lost L hearing aid), L inattention, R neglect, L visual field deficit, bowel/bladder incontinence, R LE extensor tone Restrictions Weight Bearing Restrictions: No  Pain: Pain Assessment Pain Scale: 0-10 Pain Score: 0-No pain ADL: ADL Eating: Maximal assistance Grooming: Maximal assistance Where Assessed-Grooming: Sitting at sink Upper Body Bathing: Maximal assistance Where Assessed-Upper Body Bathing: Sitting at  sink Lower Body Bathing: Dependent Where Assessed-Lower Body Bathing: Sitting at sink Upper Body Dressing: Maximal assistance, Maximal cueing Where Assessed-Upper Body Dressing: Sitting at sink Lower Body Dressing: Dependent, Maximal cueing Where Assessed-Lower Body Dressing: Bed level Toileting: Dependent, Maximal cueing Where Assessed-Toileting: Toilet Toilet Transfer: Maximal assistance Toilet Transfer Method: Squat pivot Toilet Transfer Equipment: Grab bars Tub/Shower Transfer Method: Unable to assess Walk-In Shower Transfer Method: Unable to assess   Therapy/Group: Individual Therapy  SAGUIER,JULIA 08/19/2020, 12:40 PM 

## 2020-08-19 NOTE — Progress Notes (Signed)
Patient ID: Ellen Barajas, female   DOB: 1928/11/07, 87 y.o.   MRN: [id number] Scheduling PTAR at 9:00 am niece wants her to be home has aides arranged and hospice in place. Equipment delivered.

## 2020-08-19 NOTE — Progress Notes (Addendum)
PROGRESS NOTE   Subjective/Complaints: Sitting at nursing station, Alert but disoriented   ROS: Limited due to cognition/behavioral    Objective:   No results found. No results for input(s): WBC, HGB, HCT, PLT in the last 72 hours.  No results for input(s): NA, K, CL, CO2, GLUCOSE, BUN, CREATININE, CALCIUM in the last 72 hours.   Intake/Output Summary (Last 24 hours) at 08/19/2020 1328 Last data filed at 08/19/2020 1254 Gross per 24 hour  Intake 558 ml  Output --  Net 558 ml         Physical Exam: Vital Signs Blood pressure (!) 169/75, pulse 73, temperature 98.1 F (36.7 C), resp. rate 18, height 5\' 3"  (1.6 m), weight 63.5 kg, SpO2 98 %.  General: No acute distress Mood and affect are appropriate Heart: Regular rate and rhythm no rubs murmurs or extra sounds Lungs: Clear to auscultation, breathing unlabored, no rales or wheezes Abdomen: Positive bowel sounds, soft nontender to palpation, nondistended Extremities: No clubbing, cyanosis, or edema Skin: No evidence of breakdown, no evidence of rash Neuro: aphasic- not able to follow commands appropriately  RIght gaze preference  Alert. Left sided neglect as well as probable field cut  moves left side automatically, less to command. Grips right hand firmly 4/5 strength. But with increase UE flexor tone and persistent grasp, RLE 3-/5 with increased extensor tone, LUE and LLE 4/5  Assessment/Plan: 1. Functional deficits which require 3+ hours per day of interdisciplinary therapy in a comprehensive inpatient rehab setting. Physiatrist is providing close team supervision and 24 hour management of active medical problems listed below. Physiatrist and rehab team continue to assess barriers to discharge/monitor patient progress toward functional and medical goals  Care Tool:  Bathing    Body parts bathed by patient: Face   Body parts bathed by helper: Right arm, Left  arm, Chest, Abdomen, Front perineal area, Buttocks, Left lower leg, Right lower leg, Left upper leg, Right upper leg Body parts n/a: Right arm, Left arm, Front perineal area, Buttocks, Right upper leg, Left upper leg, Left lower leg, Right lower leg   Bathing assist Assist Level: Total Assistance - Patient < 25%     Upper Body Dressing/Undressing Upper body dressing   What is the patient wearing?: Pull over shirt    Upper body assist Assist Level: Maximal Assistance - Patient 25 - 49%    Lower Body Dressing/Undressing Lower body dressing      What is the patient wearing?: Pants, Incontinence brief     Lower body assist Assist for lower body dressing: Total Assistance - Patient < 25%     Toileting Toileting    Toileting assist Assist for toileting: Dependent - Patient 0%     Transfers Chair/bed transfer  Transfers assist     Chair/bed transfer assist level: Total Assistance - Patient < 25%     Locomotion Ambulation   Ambulation assist   Ambulation activity did not occur: Safety/medical concerns  Assist level: Maximal Assistance - Patient 25 - 49% Assistive device: Walker-rolling Max distance: 10'   Walk 10 feet activity   Assist  Walk 10 feet activity did not occur: Safety/medical concerns  Assist level: 2  helpers Assistive device: Hand held assist   Walk 50 feet activity   Assist Walk 50 feet with 2 turns activity did not occur: Safety/medical concerns  Assist level: 2 helpers Assistive device: Hand held assist    Walk 150 feet activity   Assist Walk 150 feet activity did not occur: Safety/medical concerns         Walk 10 feet on uneven surface  activity   Assist Walk 10 feet on uneven surfaces activity did not occur: Safety/medical concerns         Wheelchair     Assist Will patient use wheelchair at discharge?: No (home with hospice care)   Wheelchair activity did not occur: N/A         Wheelchair 50 feet with 2  turns activity    Assist    Wheelchair 50 feet with 2 turns activity did not occur: N/A       Wheelchair 150 feet activity     Assist  Wheelchair 150 feet activity did not occur: N/A      BP (!) 169/75 (BP Location: Left Arm)   Pulse 73   Temp 98.1 F (36.7 C)   Resp 18   Ht 5\' 3"  (1.6 m)   Wt 63.5 kg   SpO2 98%   BMI 24.80 kg/m   Medical Problem List and Plan: 1.  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   -palliative care consult placed- planning home hospice if pt discharges to home   -con't PT, OT and SLP- hospice planned- acceptable candidate as per hospice 2.  Antithrombotics: -DVT/anticoagulation: Xarelto             -antiplatelet therapy: Plavix 75 mg daily 3. Pain Management: Tylenol as needed 4. Mood: Provide emotional support             -antipsychotic agents: N/A 5. Neuropsych: This patient is NOT capable of making decisions on her own behalf. 6. Skin/Wound Care: Routine skin checks 7. Fluids/Electrolytes/Nutrition: Routine in and outs with follow-up chemistries. Labs reviewed and stable.  8.  Chronic atrial fibrillation.  Continue Xarelto.  Follow-up cardiology services with daily EKGs: stable with metoprolol 50mg  BID. EKGs showing Afib normal rate with nl QTc, Will clarify with cardiology how long patient will need daily EKG given hospice status.  9.  Hypertension/atrial fibrillation.  Continue Cozaar 25 mg daily, Lopressor 37.5 mg twice daily. BP elevated to  increase Lopressor to 75mg  BID on 7/19 monitor effect. Labile: continue current regimen.  Vitals:   08/18/20 2024 08/19/20 0519  BP: (!) 153/70 (!) 169/75  Pulse: 83 73  Resp:  18 18  Temp: 98.1 F (36.7 C) 98.1 F (36.7 C)  SpO2: 98% 98%   10.  Chronic systolic congestive heart failure.  Continue Continue overload   Filed Weights   08/17/20 0503 08/18/20 0542 08/19/20 0519  Weight: 63.6 kg 63.5 kg 63.5 kg   11.  CAD with history of CABG. follow-up cardiology services 12.  Hyperlipidemia.  Continue Lipitor 13.  Multiple facial fractures.  Follow-up outpatient DR 08/19/20.  Placed  on empiric Keflex x7 days 14. Bowel and Bladder incont, likely cognitive/awareness, timed toileting- discussed with niece 73. Hard of hearing: please use masks with transparent piece over lips when possible- discussed with therapy and niece. Use hearing aides.  16. Decreased initiation  Amantadine started 7/16 with good results. Increase to 200mg  LOS: 14 days A FACE TO FACE EVALUATION WAS PERFORMED  08/19/2020, 1:28 PM

## 2020-08-19 NOTE — Discharge Summary (Signed)
Physical Therapy Discharge Summary  Patient Details  Name: Ellen Barajas MRN: [id number] Date of Birth: 23-Jan-1929  Today's Date: 08/19/2020 PT Individual Time: 1000-1045 PT Individual Time Calculation (min): 45 min    Patient has met 0 of 9 long term goals. Despite goals being downgraded, pt unable to achieve LTG. Limited progress limited due to significant bilateral CVA's, R neglect, L inattention, L visual field impairment, aphasia, bowel/bladder incontinence, motor and ideation apraxia, RLE extensor tone. Per palliative care and team conferences, plan moving forward is to discharge home with niece and hospice services.  Patient to discharge at a wheelchair level Total Assist.   Patient's care partner requires assistance to provide the necessary physical and cognitive assistance at discharge. Private care provided via hospice services for caregiver burden.  Reasons goals not met: See above  Recommendation:  Patient will benefit from ongoing skilled PT services in home health setting to continue to advance safe functional mobility, address ongoing impairments in bed mobility, bed positioning, functional transfers, and caregiver training in order to minimize fall risk.  Equipment: Per CSW, supplied via hospice services  Reasons for discharge: lack of progress toward goals and discharge from hospital  Patient/family agrees with progress made and goals achieved: Yes  PT Discharge Precautions/Restrictions Precautions Precautions: Fall Precaution Comments: aphasia, very hard of hearing (lost L hearing aid), L inattention, R neglect, L visual field deficit, bowel/bladder incontinence, R LE extensor tone Restrictions Weight Bearing Restrictions: No Pain   Vision/Perception  Vision - Assessment Eye Alignment: Impaired (comment) Ocular Range of Motion: Restricted on the left;Impaired-to be further tested in functional context Alignment/Gaze Preference: Head turned;Gaze  right Tracking/Visual Pursuits: Impaired - to be further tested in functional context;Requires cues, head turns, or add eye shifts to track;Decreased smoothness of horizontal tracking;Left eye does not track laterally;Right eye does not track medially Perception Inattention/Neglect: Does not attend to left visual field;Does not attend to right side of body;Impaired-to be further tested in functional context Praxis Praxis: Impaired Praxis Impairment Details: Initiation;Motor planning;Perseveration;Ideation;Ideomotor  Cognition Overall Cognitive Status: Impaired/Different from baseline Arousal/Alertness: Awake/alert Orientation Level: Oriented to person;Disoriented to time;Disoriented to place;Disoriented to situation Attention: Sustained;Focused Focused Attention: Impaired Focused Attention Impairment: Verbal basic;Functional basic Sustained Attention: Impaired Sustained Attention Impairment: Verbal basic;Functional basic Awareness: Impaired Awareness Impairment: Intellectual impairment Problem Solving: Impaired Problem Solving Impairment: Verbal basic;Functional basic Sequencing: Impaired Sequencing Impairment: Functional basic Initiating: Impaired Initiating Impairment: Verbal basic;Functional basic Safety/Judgment: Impaired Sensation Sensation Light Touch: Impaired by gross assessment Hot/Cold: Not tested Proprioception: Impaired by gross assessment Stereognosis: Not tested Additional Comments: Unable to accurately assess due to aphasia and cog deficits Coordination Gross Motor Movements are Fluid and Coordinated: No Fine Motor Movements are Fluid and Coordinated: No Coordination and Movement Description: Absent to very delayed initiation and processing, movement patterns impacted by RLE extensor tone Heel Shin Test: UTA due to weakness and aphasia Motor  Motor Motor: Hemiplegia;Abnormal tone;Abnormal postural alignment and control;Motor impersistence;Motor apraxia Motor -  Skilled Clinical Observations: R hemi  Mobility Bed Mobility Bed Mobility: Rolling Right;Rolling Left;Supine to Sit;Sit to Supine Rolling Right: Total Assistance - Patient < 25% Rolling Left: Total Assistance - Patient < 25% Supine to Sit: Maximal Assistance - Patient - Patient 25-49% Sit to Supine: Total Assistance - Patient < 25% Transfers Transfers: Sit to Stand;Stand to Sit;Stand Pivot Transfers Sit to Stand: Maximal Assistance - Patient 25-49% Stand to Sit: Maximal Assistance - Patient 25-49% Stand Pivot Transfers: Maximal Assistance - Patient 25 - 49% Stand Pivot Transfer Details: Tactile  cues for initiation;Tactile cues for placement;Tactile cues for weight beaing;Tactile cues for weight shifting;Tactile cues for posture;Visual cues/gestures for precautions/safety;Verbal cues for precautions/safety;Verbal cues for technique;Verbal cues for sequencing;Verbal cues for gait pattern;Visual cues/gestures for sequencing;Manual facilitation for weight shifting;Manual facilitation for placement;Manual facilitation for weight bearing Stand Pivot Transfer Details (indicate cue type and reason): Significant difficulty moving RLE into steps for sequencing for transfers - requires max multi-modal cues for general technique and sequencing. Squat Pivot Transfers: Maximal Assistance - Patient 25-49% Transfer (Assistive device): 1 person hand held assist Locomotion  Gait Ambulation: Yes Gait Assistance: Maximal Assistance - Patient 25-49% Gait Distance (Feet): 5 Feet Assistive device: Parallel bars Gait Assistance Details: Tactile cues for initiation;Tactile cues for placement;Tactile cues for weight beaing;Tactile cues for sequencing;Tactile cues for posture;Tactile cues for weight shifting;Verbal cues for precautions/safety;Verbal cues for technique;Manual facilitation for weight shifting;Verbal cues for sequencing;Visual cues/gestures for sequencing;Verbal cues for gait pattern;Manual facilitation  for weight bearing;Manual facilitation for placement Gait Assistance Details: requires hand-over-hand assist for guiding RUE along // bar due to neglect and also required totalA for facilitating RLE stepping due to extensor tone Gait Gait: Yes Gait Pattern: Impaired Gait Pattern: Step-to pattern;Decreased stance time - right;Decreased step length - right;Decreased step length - left;Decreased hip/knee flexion - right;Decreased hip/knee flexion - left;Decreased weight shift to right;Decreased dorsiflexion - right;Narrow base of support;Poor foot clearance - right;Trunk flexed Gait velocity: Decreased Stairs / Additional Locomotion Stairs: No Wheelchair Mobility Wheelchair Mobility: No (unable to propel herself due to cognitive deficits)  Trunk/Postural Assessment  Cervical Assessment Cervical Assessment: Within Functional Limits Thoracic Assessment Thoracic Assessment: Exceptions to [facility] (rounded shoulders) Lumbar Assessment Lumbar Assessment: Exceptions to [facility] (posterior pelvic tilt) Postural Control Postural Control: Deficits on evaluation (delayed and inadequate)  Balance Balance Balance Assessed: Yes Static Sitting Balance Static Sitting - Balance Support: Feet supported;No upper extremity supported Static Sitting - Level of Assistance: 4: Min assist Dynamic Sitting Balance Dynamic Sitting - Balance Support: Feet unsupported;During functional activity Dynamic Sitting - Level of Assistance: 2: Max Insurance 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 Ankle DF 2/5, knee ext 3+/5, hip flex 2/5 LLE Assessment LLE Assessment: Exceptions to [facility] General Strength Comments: Difficult to assess due to cognitive deficits and aphasia. Ankle DF 4/5, knee ext 4/5, hip flex 3+/5 *strength assessment limited by attention, aphasia, processing, and awareness  Skilled Intervention: Pt greeted supine in bed, awake and is agreeable to PT session. She denies pain. Donned disposable pants with totalA and noted pt to be incontinent of urine with saturated brief. She rolled in bed L<>R with totalA and needing totalA for pericare. New brief and pants donned at bed level with totalA as well. Completed supine<>sit with maxA with use of bed features and has significant truncal ridgity and difficulty transitioning to sitting position. Completes stand<>pivot transfer with totalA from EOB to w/c, towards her R side - provided cues to promote visual scanning to R and locate sitting surface. Pt then wheeled to main rehab gym for time management. Completes stand<>pivot transf with totalA from w/c to her R side to mat table - again provided cues and instruction  for locating mat table - impacted by RLE extensor tone and truncal ridgitiy. While sitting on mat table, worked on card matching (attempted to complete in standing but pt unable to dual-cog task and due to significant assist, unable to safety complete so deferred to sitting). While sitting, pt unable to locate matching card and due to aphasia, had difficulty following instructions. Deferred card matching game and attempted to work on sit<>stand transfers - continued to be impacted by significant extensor tone. Assisted back to be her w/c via stand pivot with totalA and she was wheeled to the nurses station where she was station to promote stimulation/attention and L visual scanning. Safety belt alarm on and all needs met. RN made aware.   Stephinie Battisti P Iretha Kirley PT, DPT 08/19/2020, 8:00 AM

## 2020-08-19 NOTE — Progress Notes (Signed)
Speech Language Pathology Discharge Summary  Patient Details  Name: Ellen Barajas MRN: [id number] Date of Birth: 12/01/1928  Today's Date: 08/19/2020 SLP Individual Time: 1505-6979 SLP Individual Time Calculation (min): 44 min   Skilled Therapeutic Interventions:   Skilled ST services focused on language skills.  SLP planned dys 3 texture trial tray, however nursing staff already provided supervision with tray desite request to hold tray for SLP relayed to NT. SLP was unable to locate nurse to communicate about pt's tolerance on upgraded diet and pt refused dys 3 texture snack trial. SLP recommends pt remain on dys 2 textures until further assessment by an SLP is conducted, but likely pt can advance diet at bedside with ability to clear oral cavity. Pt denied consuming lunch and even when SLP provided tray pt continued to respond "no." Pt supports increase confusion today and required multiple repetition with delayed response to verbally respond to yes/no questions. Pt was able to follow 1 step body commands with 70% accuracy once given initial demonstration for initial command and responded to yes/no questions pertaining to biographical/environmental information with 70% accuracy. Pt was left in room with call bell within reach and bed alarm set. SLP recommends to continue skilled services.   Patient has met 6 of 6 long term goals.  Patient to discharge at overall Min;Mod level.  Reasons goals not met:     Clinical Impression/Discharge Summary:   Pt made good progress meeting 6 out 6 goals. Pt demonstrated increase ability to respond to yes/no questions pertaining to biographical/environmental information with 70-90% accuracy, can identify objects in a field of 2-3 when placed on right side and follow 1 step commands with initial demonstration and hand over hand cues. Pt is able to express wants/needs at word-phrase level, however response to yes/no questions reflects the most accuracy due to semantic  errors and halting speech. Pt demonstrates moderate awareness of errors, requires max-mod A verbal/visual cues to scan left of midline and mod A verbal cues for sustained attention. Pt demonstrated increase awareness and oral control allowing diet to advance to dys 2 textures and thin liquid via cup, with on going trials of dys 3 textures. Pt benefited from skilled ST services in order to maximize functional independence and reduce burden of care, requiring 24 hour supervision at discharge with continued skilled ST services.  Care Partner:  Caregiver Able to Provide Assistance: Yes  Type of Caregiver Assistance: Physical;Cognitive  Recommendation:  24 hour supervision/assistance;Home Health SLP  Rationale for SLP Follow Up: Maximize functional communication;Maximize cognitive function and independence;Reduce caregiver burden;Maximize swallowing safety   Equipment: N/A   Reasons for discharge: Discharged from hospital   Patient/Family Agrees with Progress Made and Goals Achieved: Yes    Jadarian Mckay 08/19/2020, 1:33 PM

## 2020-08-20 NOTE — Progress Notes (Signed)
Pt belongings gathered. Discharge packet returned to family. Family has no further questions. Pt left per stretcher with PTAR to home. No complications noted at this time.  Mylo Red, LPN

## 2020-08-20 NOTE — Progress Notes (Signed)
PROGRESS NOTE   Subjective/Complaints:  Awake but does not respond to questions or commands No spont verbalization   ROS: Limited due to cognition/behavioral    Objective:   No results found. No results for input(s): WBC, HGB, HCT, PLT in the last 72 hours.  No results for input(s): NA, K, CL, CO2, GLUCOSE, BUN, CREATININE, CALCIUM in the last 72 hours.   Intake/Output Summary (Last 24 hours) at 08/20/2020 0729 Last data filed at 08/19/2020 1744 Gross per 24 hour  Intake 590 ml  Output --  Net 590 ml         Physical Exam: Vital Signs Blood pressure (!) 157/69, pulse 73, temperature 98.2 F (36.8 C), resp. rate 18, height 5\' 3"  (1.6 m), weight 63.5 kg, SpO2 99 %.   General: No acute distress Mood and affect are appropriate Heart: Regular rate and rhythm no rubs murmurs or extra sounds Lungs: Clear to auscultation, breathing unlabored, no rales or wheezes Abdomen: Positive bowel sounds, soft nontender to palpation, nondistended   Neuro: aphasic- not able to follow commands appropriately  RIght gaze preference  Alert. Left sided neglect as well as probable field cut  moves left side automatically, less to command. Not cooperating with exam this am   Assessment/Plan: 1. Functional deficits  Stable for D/C today F/u PCP in 3-4 weeks F/u PM&R 2 months Dr See D/C summary See D/C instructions  Care Tool:  Bathing    Body parts bathed by patient: Face   Body parts bathed by helper: Right arm, Left arm, Chest, Abdomen, Front perineal area, Buttocks, Left lower leg, Right lower leg, Left upper leg, Right upper leg Body parts n/a: Right arm, Left arm, Front perineal area, Buttocks, Right upper leg, Left upper leg, Left lower leg, Right lower leg   Bathing assist Assist Level: Total Assistance - Patient < 25%     Upper Body Dressing/Undressing Upper body dressing   What is the patient wearing?: Pull  over shirt    Upper body assist Assist Level: Maximal Assistance - Patient 25 - 49%    Lower Body Dressing/Undressing Lower body dressing      What is the patient wearing?: Pants, Incontinence brief     Lower body assist Assist for lower body dressing: Total Assistance - Patient < 25%     Toileting Toileting    Toileting assist Assist for toileting: Dependent - Patient 0%     Transfers Chair/bed transfer  Transfers assist     Chair/bed transfer assist level: Total Assistance - Patient < 25%     Locomotion Ambulation   Ambulation assist   Ambulation activity did not occur: Safety/medical concerns  Assist level: Maximal Assistance - Patient 25 - 49% Assistive device: Walker-rolling Max distance: 10'   Walk 10 feet activity   Assist  Walk 10 feet activity did not occur: Safety/medical concerns  Assist level: 2 helpers Assistive device: Hand held assist   Walk 50 feet activity   Assist Walk 50 feet with 2 turns activity did not occur: Safety/medical concerns  Assist level: 2 helpers Assistive device: Hand held assist    Walk 150 feet activity   Assist Walk 150 feet  activity did not occur: Safety/medical concerns         Walk 10 feet on uneven surface  activity   Assist Walk 10 feet on uneven surfaces activity did not occur: Safety/medical concerns         Wheelchair     Assist Will patient use wheelchair at discharge?: No (home with hospice care)   Wheelchair activity did not occur: N/A         Wheelchair 50 feet with 2 turns activity    Assist    Wheelchair 50 feet with 2 turns activity did not occur: N/A       Wheelchair 150 feet activity     Assist  Wheelchair 150 feet activity did not occur: N/A      BP (!) 157/69 (BP Location: Right Arm)   Pulse 73   Temp 98.2 F (36.8 C)   Resp 18   Ht 5\' 3"  (1.6 m)   Wt 63.5 kg   SpO2 99%   BMI 24.80 kg/m   Medical Problem List and Plan: 1.  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     2.  Antithrombotics: -DVT/anticoagulation: Xarelto             -antiplatelet therapy: Plavix 75 mg daily 3. Pain Management: Tylenol as needed 4. Mood: Provide emotional support             -antipsychotic agents: N/A 5. Neuropsych: This patient is NOT capable of making decisions on her own behalf. 6. Skin/Wound Care: Routine skin checks 7. Fluids/Electrolytes/Nutrition: Routine in and outs with follow-up chemistries. Labs reviewed and stable.  8.  Chronic atrial fibrillation.  Continue Xarelto.  Follow-up cardiology services with daily EKGs: stable with metoprolol 50mg  BID. EKGs showing Afib normal rate with nl QTc,d/c daily EKG 9.  Hypertension/atrial fibrillation.  Continue Cozaar 25 mg daily, Lopressor 37.5 mg twice daily. BP elevated to  increase Lopressor to 75mg  BID on 7/19 monitor effect. Labile: continue current regimen.  Vitals:   08/19/20 2018 08/20/20 0558  BP: 134/72 (!) 157/69  Pulse: (!) 102 73  Resp: 18 18  Temp: 98.2 F (36.8 C) 98.2 F (36.8 C)  SpO2: 96% 99%   10.  Chronic systolic congestive heart failure.  Continue Continue overload   Filed Weights   08/17/20 0503 08/18/20 0542 08/19/20 0519  Weight: 63.6 kg 63.5 kg 63.5 kg   11.  CAD with history of CABG. follow-up cardiology services 12.  Hyperlipidemia.  Continue Lipitor 13.  Multiple facial fractures.  Follow-up outpatient DR 08/19/20.  Placed on empiric Keflex x7 days 14. Bowel and Bladder incont, likely cognitive/awareness, timed toileting- discussed with niece 59. Hard of hearing: please use masks with transparent piece over lips when possible- discussed with therapy and niece. Use hearing aides.  16.  Decreased initiation  Amantadine started 7/16 with good results. Increase to 200mg  LOS: 15 days A FACE TO FACE EVALUATION WAS PERFORMED  Ellen Barajas 08/20/2020, 7:29 AM

## 2020-08-20 NOTE — Plan of Care (Signed)
Patient not continent; timed toileting unsuccessful

## 2020-09-21 ENCOUNTER — Encounter (INDEPENDENT_AMBULATORY_CARE_PROVIDER_SITE_OTHER): Payer: Medicare Other | Admitting: Ophthalmology

## 2020-09-24 DEATH — deceased

## 2020-10-07 ENCOUNTER — Ambulatory Visit: Payer: Medicare Other | Admitting: Cardiovascular Disease
